# Patient Record
Sex: Female | Born: 1975
Health system: Southern US, Community
[De-identification: ages and names within clinical notes are randomized; demographics above are authoritative.]

## PROBLEM LIST (undated history)

## (undated) DIAGNOSIS — I499 Cardiac arrhythmia, unspecified: Secondary | ICD-10-CM

## (undated) DIAGNOSIS — R002 Palpitations: Secondary | ICD-10-CM

## (undated) DIAGNOSIS — C50919 Malignant neoplasm of unspecified site of unspecified female breast: Secondary | ICD-10-CM

## (undated) DIAGNOSIS — M25512 Pain in left shoulder: Secondary | ICD-10-CM

## (undated) DIAGNOSIS — I493 Ventricular premature depolarization: Secondary | ICD-10-CM

## (undated) DIAGNOSIS — L209 Atopic dermatitis, unspecified: Secondary | ICD-10-CM

## (undated) DIAGNOSIS — C4491 Basal cell carcinoma of skin, unspecified: Secondary | ICD-10-CM

## (undated) DIAGNOSIS — M25511 Pain in right shoulder: Secondary | ICD-10-CM

## (undated) HISTORY — DX: Hemochromatosis, unspecified: E83.119

## (undated) HISTORY — DX: Malignant neoplasm of unspecified site of unspecified female breast: C50.919

## (undated) HISTORY — PX: OTHER SURGICAL HISTORY: SHX169

## (undated) HISTORY — DX: Atopic dermatitis, unspecified: L20.9

## (undated) HISTORY — DX: Palpitations: R00.2

## (undated) HISTORY — DX: Pain in right shoulder: M25.511

## (undated) HISTORY — DX: Basal cell carcinoma of skin, unspecified: C44.91

## (undated) HISTORY — PX: WISDOM TOOTH EXTRACTION: SHX21

## (undated) HISTORY — DX: Pain in left shoulder: M25.512

---

## 2004-08-12 ENCOUNTER — Inpatient Hospital Stay: Payer: Self-pay

## 2004-08-12 DIAGNOSIS — O368390 Maternal care for abnormalities of the fetal heart rate or rhythm, unspecified trimester, not applicable or unspecified: Secondary | ICD-10-CM

## 2007-11-02 ENCOUNTER — Ambulatory Visit: Payer: Self-pay

## 2008-11-14 ENCOUNTER — Ambulatory Visit: Payer: Self-pay

## 2011-01-31 ENCOUNTER — Ambulatory Visit: Payer: Self-pay

## 2011-01-31 ENCOUNTER — Ambulatory Visit: Payer: Self-pay | Admitting: Internal Medicine

## 2011-02-01 LAB — AFP TUMOR MARKER: AFP-Tumor Marker: 2.9 ng/mL (ref 0.0–8.3)

## 2011-02-11 ENCOUNTER — Ambulatory Visit: Payer: Self-pay | Admitting: Internal Medicine

## 2011-02-11 ENCOUNTER — Ambulatory Visit: Payer: Self-pay

## 2011-06-02 ENCOUNTER — Ambulatory Visit: Payer: Self-pay | Admitting: Internal Medicine

## 2011-06-02 LAB — CANCER CENTER HEMOGLOBIN: HGB: 14.3 g/dL (ref 12.0–16.0)

## 2011-06-02 LAB — HEPATIC FUNCTION PANEL A (ARMC)
Albumin: 3.4 g/dL (ref 3.4–5.0)
Alkaline Phosphatase: 44 U/L — ABNORMAL LOW (ref 50–136)
Bilirubin, Direct: <0.1 mg/dL (ref 0.00–0.20)
SGOT(AST): 23 U/L (ref 15–37)
SGPT (ALT): 19 U/L
Total Protein: 7.1 g/dL (ref 6.4–8.2)

## 2011-06-02 LAB — IRON AND TIBC
Iron Bind.Cap.(Total): 394 ug/dL (ref 250–450)
Iron Saturation: 57 %
Iron: 225 ug/dL — ABNORMAL HIGH (ref 50–170)
Unbound Iron-Bind.Cap.: 169 ug/dL

## 2011-06-02 LAB — FERRITIN: Ferritin (ARMC): 51 ng/mL (ref 8–388)

## 2011-06-11 ENCOUNTER — Ambulatory Visit: Payer: Self-pay | Admitting: Internal Medicine

## 2011-10-03 ENCOUNTER — Ambulatory Visit: Payer: Self-pay | Admitting: Internal Medicine

## 2011-10-03 LAB — IRON AND TIBC
Iron Bind.Cap.(Total): 403 ug/dL (ref 250–450)
Iron Saturation: 33 %
Unbound Iron-Bind.Cap.: 271 ug/dL

## 2011-10-03 LAB — CANCER CENTER HEMOGLOBIN: HGB: 14.7 g/dL (ref 12.0–16.0)

## 2011-10-03 LAB — HEPATIC FUNCTION PANEL A (ARMC)
Albumin: 3.6 g/dL (ref 3.4–5.0)
Bilirubin, Direct: 0.1 mg/dL (ref 0.00–0.20)
Bilirubin,Total: 0.2 mg/dL (ref 0.2–1.0)
SGPT (ALT): 32 U/L (ref 12–78)
Total Protein: 7.3 g/dL (ref 6.4–8.2)

## 2011-10-03 LAB — FERRITIN: Ferritin (ARMC): 36 ng/mL (ref 8–388)

## 2011-10-12 ENCOUNTER — Ambulatory Visit: Payer: Self-pay | Admitting: Internal Medicine

## 2012-02-19 ENCOUNTER — Ambulatory Visit: Payer: Self-pay | Admitting: Internal Medicine

## 2012-02-19 LAB — CBC CANCER CENTER
Basophil #: 0 x10 3/mm (ref 0.0–0.1)
Basophil %: 0.4 %
Eosinophil %: 1.7 %
HGB: 14.4 g/dL (ref 12.0–16.0)
Lymphocyte #: 1.9 x10 3/mm (ref 1.0–3.6)
MCHC: 33.7 g/dL (ref 32.0–36.0)
Neutrophil #: 2.7 x10 3/mm (ref 1.4–6.5)
Neutrophil %: 52.3 %
Platelet: 292 x10 3/mm (ref 150–440)
RBC: 4.52 10*6/uL (ref 3.80–5.20)
RDW: 12.4 % (ref 11.5–14.5)

## 2012-02-19 LAB — HEPATIC FUNCTION PANEL A (ARMC)
Albumin: 3.6 g/dL (ref 3.4–5.0)
Alkaline Phosphatase: 62 U/L (ref 50–136)
Bilirubin, Direct: 0.1 mg/dL (ref 0.00–0.20)
Bilirubin,Total: 0.3 mg/dL (ref 0.2–1.0)
SGOT(AST): 16 U/L (ref 15–37)
SGPT (ALT): 23 U/L (ref 12–78)
Total Protein: 7.3 g/dL (ref 6.4–8.2)

## 2012-02-19 LAB — IRON AND TIBC
Iron Bind.Cap.(Total): 402 ug/dL (ref 250–450)
Iron: 197 ug/dL — ABNORMAL HIGH (ref 50–170)

## 2012-02-20 LAB — AFP TUMOR MARKER: AFP-Tumor Marker: 2.5 ng/mL (ref 0.0–8.3)

## 2012-03-13 ENCOUNTER — Ambulatory Visit: Payer: Self-pay | Admitting: Internal Medicine

## 2012-03-18 LAB — CANCER CENTER HEMOGLOBIN: HGB: 13.9 g/dL (ref 12.0–16.0)

## 2012-04-10 ENCOUNTER — Ambulatory Visit: Payer: Self-pay | Admitting: Internal Medicine

## 2012-05-11 ENCOUNTER — Ambulatory Visit: Payer: Self-pay | Admitting: Internal Medicine

## 2012-05-19 LAB — CANCER CENTER HEMOGLOBIN: HGB: 11.6 g/dL — ABNORMAL LOW (ref 12.0–16.0)

## 2012-06-09 LAB — CANCER CENTER HEMOGLOBIN: HGB: 11.3 g/dL — ABNORMAL LOW (ref 12.0–16.0)

## 2012-06-10 ENCOUNTER — Ambulatory Visit: Payer: Self-pay | Admitting: Internal Medicine

## 2012-06-30 LAB — CANCER CENTER HEMOGLOBIN: HGB: 10.3 g/dL — ABNORMAL LOW (ref 12.0–16.0)

## 2012-07-11 ENCOUNTER — Ambulatory Visit: Payer: Self-pay | Admitting: Internal Medicine

## 2012-08-10 ENCOUNTER — Ambulatory Visit: Payer: Self-pay | Admitting: Internal Medicine

## 2012-08-25 LAB — HEPATIC FUNCTION PANEL A (ARMC)
Albumin: 3.3 g/dL — ABNORMAL LOW (ref 3.4–5.0)
Bilirubin, Direct: <0.1 mg/dL (ref 0.00–0.20)
SGOT(AST): 17 U/L (ref 15–37)
SGPT (ALT): 17 U/L (ref 12–78)
Total Protein: 7.1 g/dL (ref 6.4–8.2)

## 2012-08-25 LAB — IRON AND TIBC
Iron Saturation: 7 %
Iron: 33 ug/dL — ABNORMAL LOW (ref 50–170)
Unbound Iron-Bind.Cap.: 452 ug/dL

## 2012-08-25 LAB — CBC CANCER CENTER
Basophil #: 0 x10 3/mm (ref 0.0–0.1)
Eosinophil %: 2.2 %
HCT: 35.2 % (ref 35.0–47.0)
HGB: 11.4 g/dL — ABNORMAL LOW (ref 12.0–16.0)
MCH: 24.2 pg — ABNORMAL LOW (ref 26.0–34.0)
MCHC: 32.3 g/dL (ref 32.0–36.0)
Monocyte #: 0.4 x10 3/mm (ref 0.2–0.9)
Monocyte %: 9.7 %
Neutrophil #: 2.2 x10 3/mm (ref 1.4–6.5)
Neutrophil %: 51.2 %
Platelet: 358 x10 3/mm (ref 150–440)
RBC: 4.7 10*6/uL (ref 3.80–5.20)
RDW: 17.4 % — ABNORMAL HIGH (ref 11.5–14.5)
WBC: 4.3 x10 3/mm (ref 3.6–11.0)

## 2012-08-25 LAB — FERRITIN: Ferritin (ARMC): 3 ng/mL — ABNORMAL LOW (ref 8–388)

## 2012-08-25 LAB — CREATININE, SERUM
EGFR (African American): >60
EGFR (Non-African Amer.): >60

## 2012-08-26 LAB — AFP TUMOR MARKER: AFP-Tumor Marker: 1.9 ng/mL (ref 0.0–8.3)

## 2012-09-10 ENCOUNTER — Ambulatory Visit: Payer: Self-pay | Admitting: Internal Medicine

## 2012-10-12 ENCOUNTER — Ambulatory Visit: Payer: Self-pay | Admitting: Internal Medicine

## 2012-10-12 LAB — CBC CANCER CENTER
Basophil #: 0 x10 3/mm (ref 0.0–0.1)
Basophil %: 0.6 %
Eosinophil #: 0.1 x10 3/mm (ref 0.0–0.7)
HCT: 36.3 % (ref 35.0–47.0)
MCH: 25 pg — ABNORMAL LOW (ref 26.0–34.0)
MCHC: 32.2 g/dL (ref 32.0–36.0)
Neutrophil %: 49.5 %
Platelet: 274 x10 3/mm (ref 150–440)
RBC: 4.67 10*6/uL (ref 3.80–5.20)
WBC: 4.5 x10 3/mm (ref 3.6–11.0)

## 2012-10-12 LAB — IRON AND TIBC: Unbound Iron-Bind.Cap.: 445 ug/dL

## 2012-11-09 LAB — IRON AND TIBC
Iron Saturation: 56 %
Iron: 205 ug/dL — ABNORMAL HIGH (ref 50–170)

## 2012-11-10 ENCOUNTER — Ambulatory Visit: Payer: Self-pay | Admitting: Internal Medicine

## 2013-01-04 ENCOUNTER — Ambulatory Visit: Payer: Self-pay | Admitting: Internal Medicine

## 2013-01-04 LAB — IRON AND TIBC
Iron Saturation: 23 %
Iron: 91 ug/dL (ref 50–170)
Unbound Iron-Bind.Cap.: 308 ug/dL

## 2013-01-04 LAB — CANCER CENTER HEMOGLOBIN: HGB: 14.1 g/dL (ref 12.0–16.0)

## 2013-01-04 LAB — FERRITIN: Ferritin (ARMC): 9 ng/mL (ref 8–388)

## 2013-01-10 ENCOUNTER — Ambulatory Visit: Payer: Self-pay | Admitting: Internal Medicine

## 2013-01-27 ENCOUNTER — Ambulatory Visit: Payer: Self-pay

## 2013-02-22 ENCOUNTER — Ambulatory Visit: Payer: Self-pay | Admitting: Internal Medicine

## 2013-02-22 LAB — CBC CANCER CENTER
BASOS PCT: 0.5 %
Basophil #: 0 x10 3/mm (ref 0.0–0.1)
EOS ABS: 0 x10 3/mm (ref 0.0–0.7)
EOS PCT: 0.5 %
HCT: 43.6 % (ref 35.0–47.0)
HGB: 14.7 g/dL (ref 12.0–16.0)
LYMPHS PCT: 33.7 %
Lymphocyte #: 1.9 x10 3/mm (ref 1.0–3.6)
MCH: 32 pg (ref 26.0–34.0)
MCHC: 33.7 g/dL (ref 32.0–36.0)
MCV: 95 fL (ref 80–100)
MONO ABS: 0.4 x10 3/mm (ref 0.2–0.9)
Monocyte %: 6.8 %
Neutrophil #: 3.4 x10 3/mm (ref 1.4–6.5)
Neutrophil %: 58.5 %
Platelet: 250 x10 3/mm (ref 150–440)
RBC: 4.59 10*6/uL (ref 3.80–5.20)
RDW: 13.6 % (ref 11.5–14.5)
WBC: 5.8 x10 3/mm (ref 3.6–11.0)

## 2013-02-22 LAB — HEPATIC FUNCTION PANEL A (ARMC)
ALBUMIN: 3.7 g/dL (ref 3.4–5.0)
ALK PHOS: 59 U/L
AST: 15 U/L (ref 15–37)
Bilirubin, Direct: 0.1 mg/dL (ref 0.00–0.20)
Bilirubin,Total: 0.3 mg/dL (ref 0.2–1.0)
SGPT (ALT): 22 U/L (ref 12–78)
TOTAL PROTEIN: 7 g/dL (ref 6.4–8.2)

## 2013-02-22 LAB — IRON AND TIBC
IRON: 260 ug/dL — AB (ref 50–170)
Iron Bind.Cap.(Total): 385 ug/dL (ref 250–450)
Iron Saturation: 68 %
UNBOUND IRON-BIND. CAP.: 125 ug/dL

## 2013-02-22 LAB — CREATININE, SERUM: Creatinine: 0.7 mg/dL (ref 0.60–1.30)

## 2013-02-22 LAB — FERRITIN: Ferritin (ARMC): 34 ng/mL (ref 8–388)

## 2013-03-13 ENCOUNTER — Ambulatory Visit: Payer: Self-pay | Admitting: Internal Medicine

## 2013-04-12 ENCOUNTER — Ambulatory Visit: Payer: Self-pay | Admitting: Cardiovascular Disease

## 2013-04-22 ENCOUNTER — Encounter (INDEPENDENT_AMBULATORY_CARE_PROVIDER_SITE_OTHER): Payer: Self-pay

## 2013-04-22 ENCOUNTER — Encounter: Payer: Self-pay | Admitting: Cardiovascular Disease

## 2013-04-22 ENCOUNTER — Ambulatory Visit (INDEPENDENT_AMBULATORY_CARE_PROVIDER_SITE_OTHER): Payer: Medicare HMO | Admitting: Cardiovascular Disease

## 2013-04-22 VITALS — BP 124/82 | HR 94 | Ht 66.0 in | Wt 124.5 lb

## 2013-04-22 DIAGNOSIS — I4949 Other premature depolarization: Secondary | ICD-10-CM

## 2013-04-22 DIAGNOSIS — R002 Palpitations: Secondary | ICD-10-CM

## 2013-04-22 DIAGNOSIS — R079 Chest pain, unspecified: Secondary | ICD-10-CM

## 2013-04-22 DIAGNOSIS — I493 Ventricular premature depolarization: Secondary | ICD-10-CM

## 2013-04-22 DIAGNOSIS — R000 Tachycardia, unspecified: Secondary | ICD-10-CM

## 2013-04-22 MED ORDER — PROPRANOLOL HCL 20 MG PO TABS: 20.0000 mg | ORAL_TABLET | Freq: Three times a day (TID) | ORAL | Status: DC

## 2013-04-22 NOTE — Patient Instructions (Addendum)
You are doing well.  Please start propranolol 20mg  three times a day as needed for tachycardia.  Go to the app store Search for "pulse meter" Look for cardiograph, instant heart rate  Your physician has recommended that you wear a 48 hour holter monitor. Holter monitors are medical devices that record the heart's electrical activity. Doctors most often use these monitors to diagnose arrhythmias. Arrhythmias are problems with the speed or rhythm of the heartbeat. The monitor is a small, portable device. You can wear one while you do your normal daily activities. This is usually used to diagnose what is causing palpitations/syncope (passing out). [facility] will contact you regarding when and where to pick this up.  Please call the office if you experience profound tachycardia and we can set up to wear a 30 day monitor.  Please call us if you have new issues that need to be addressed before your next appt.

## 2013-04-22 NOTE — Assessment & Plan Note (Signed)
She reports having periodic phlebotomy

## 2013-04-22 NOTE — Assessment & Plan Note (Signed)
Unable to exclude APCs or PVCs. 48 hour Holter monitor ordered. 30 day monitor could be ordered if needed

## 2013-04-22 NOTE — Progress Notes (Signed)
Patient ID: Adrienne Harris, female    DOB: [birth year], 38 y.o.   MRN: [id number]  HPI Comments: Adrienne Harris is a very pleasant 38 year old woman with a history of hemachromatosis who works at [employer] who presents for evaluation of palpitations and tachycardia.   She reports that at the beginning of the year 02/16/2013 she had an acute episode of tachycardia while in the kitchen washing dishes. Symptoms last for approximately one hour. It was not particularly rapid (like SVt), but the rate was faster and more regular and very strong. It resolved without further intervention.. she's not had any further episodes since that time. This was her first episode that she is aware.  She has had frequent episodes of extra beats typically in the afternoon or evening around dinner time or later. She describes these as quick fluttering in her chest, palpitations. She is relatively symptomatic  She denies any excessive coffee, cold medicines or other stimulants. She does report having stress but this is not a new issue. She does have periodic colotomy for hemachromatosis and does report having vagal episode on recent blood draw. She does not measure her blood pressure at home but does report it has been mildly elevated over the past several months. Because of this she stopped her birth control medication. Since then she reports improved blood pressure. She has not been monitoring blood pressure at home, only at work or in employee health  EKG shows normal sinus rhythm with rate 37 beats per minute, nonspecific ST abnormality  Recent blood work showing hematocrit 43, normal LFTs, creatinine 0.7   Outpatient Encounter Prescriptions as of 04/22/2013  Medication Sig  . naproxen sodium (ANAPROX) 550 MG tablet Take 550 mg by mouth 2 (two) times daily with a meal.  . propranolol (INDERAL) 20 MG tablet Take 1 tablet (20 mg total) by mouth 3 (three) times daily.     Review of Systems  Constitutional: Negative.   HENT:  Negative.   Eyes: Negative.   Respiratory: Negative.   Cardiovascular: Positive for palpitations.       Tachycardia  Gastrointestinal: Negative.   Endocrine: Negative.   Musculoskeletal: Negative.   Skin: Negative.   Allergic/Immunologic: Negative.   Neurological: Negative.   Hematological: Negative.   Psychiatric/Behavioral: Negative.   All other systems reviewed and are negative.    BP 124/82  Pulse 94  Ht 5\' 6"  (1.676 m)  Wt 124 lb 8 oz (56.473 kg)  BMI 20.10 kg/m2  Physical Exam  Nursing note and vitals reviewed. Constitutional: She is oriented to person, place, and time. She appears well-developed and well-nourished.  HENT:  Head: Normocephalic.  Nose: Nose normal.  Mouth/Throat: Oropharynx is clear and moist.  Eyes: Conjunctivae are normal. Pupils are equal, round, and reactive to light.  Neck: Normal range of motion. Neck supple. No JVD present.  Cardiovascular: Normal rate, regular rhythm, S1 normal, S2 normal, normal heart sounds and intact distal pulses.  Exam reveals no gallop and no friction rub.   No murmur heard. Pulmonary/Chest: Effort normal and breath sounds normal. No respiratory distress. She has no wheezes. She has no rales. She exhibits no tenderness.  Abdominal: Soft. Bowel sounds are normal. She exhibits no distension. There is no tenderness.  Musculoskeletal: Normal range of motion. She exhibits no edema and no tenderness.  Lymphadenopathy:    She has no cervical adenopathy.  Neurological: She is alert and oriented to person, place, and time. Coordination normal.  Skin: Skin is warm and dry. No rash  noted. No erythema.  Psychiatric: She has a normal mood and affect. Her behavior is normal. Judgment and thought content normal.    Assessment and Plan

## 2013-04-22 NOTE — Assessment & Plan Note (Signed)
Episode of tachycardia concerning for atrial tachycardia episode. Did not seem fast enough for SVT. Unable to exclude sinus tachycardia episode. Recommended she try propranolol as needed to start. She's not had any further episodes since early January 2015. Propranolol can also be used for palpitations as needed. Tried to reassure her that otherwise everything else looks good. We will order a Holter monitor 48 hour to exclude other arrhythmia. If she continues to have sporadic episodes, 30 day monitor could be ordered.  Normal EKG, normal clinical exam. No further testing needed.

## 2013-04-26 DIAGNOSIS — R002 Palpitations: Secondary | ICD-10-CM

## 2013-05-06 ENCOUNTER — Telehealth: Payer: Self-pay

## 2013-05-06 NOTE — Telephone Encounter (Signed)
Reviewed results of holter monitor w/ pt.   Discussed w/ her ways to decrease her episodes of APCs and PVCs, such as reducing or eliminating caffeine and stress, as well as getting a good night's sleep. She reports that she wakes up several times during the night and has difficulty falling back asleep.   She has propranolol at home that she has not taken.  Previously on antianxiety meds.  Advised her to contact PCP if she feels that she needs these. Advised her to take propranolol up to TID prn for episodes and try to increase her exercise.  She is agreeable to this and will call w/ further questions or concerns.

## 2013-05-09 ENCOUNTER — Other Ambulatory Visit: Payer: Self-pay

## 2013-05-09 ENCOUNTER — Ambulatory Visit (INDEPENDENT_AMBULATORY_CARE_PROVIDER_SITE_OTHER): Payer: Medicare HMO

## 2013-05-09 DIAGNOSIS — R000 Tachycardia, unspecified: Secondary | ICD-10-CM

## 2013-05-09 DIAGNOSIS — R002 Palpitations: Secondary | ICD-10-CM

## 2013-05-23 ENCOUNTER — Ambulatory Visit: Payer: Self-pay | Admitting: Internal Medicine

## 2013-05-24 LAB — IRON AND TIBC
IRON BIND. CAP.(TOTAL): 395 ug/dL (ref 250–450)
IRON: 58 ug/dL (ref 50–170)
Iron Saturation: 15 %
Unbound Iron-Bind.Cap.: 337 ug/dL

## 2013-05-24 LAB — CANCER CENTER HEMOGLOBIN: HGB: 13.4 g/dL (ref 12.0–16.0)

## 2013-05-24 LAB — FERRITIN: Ferritin (ARMC): 6 ng/mL — ABNORMAL LOW (ref 8–388)

## 2013-06-10 ENCOUNTER — Ambulatory Visit: Payer: Self-pay | Admitting: Internal Medicine

## 2013-08-09 ENCOUNTER — Ambulatory Visit: Payer: Self-pay | Admitting: Internal Medicine

## 2013-08-09 LAB — IRON AND TIBC
IRON SATURATION: 14 %
IRON: 57 ug/dL (ref 50–170)
Iron Bind.Cap.(Total): 395 ug/dL (ref 250–450)
Unbound Iron-Bind.Cap.: 338 ug/dL

## 2013-08-09 LAB — FERRITIN: FERRITIN (ARMC): 8 ng/mL (ref 8–388)

## 2013-08-09 LAB — CANCER CENTER HEMOGLOBIN: HGB: 13.8 g/dL (ref 12.0–16.0)

## 2013-08-10 ENCOUNTER — Ambulatory Visit: Payer: Self-pay | Admitting: Internal Medicine

## 2013-11-01 ENCOUNTER — Ambulatory Visit: Payer: Self-pay | Admitting: Internal Medicine

## 2013-11-01 LAB — CANCER CENTER HEMOGLOBIN: HGB: 14.4 g/dL (ref 12.0–16.0)

## 2013-11-08 LAB — IRON AND TIBC
IRON BIND. CAP.(TOTAL): 430 ug/dL (ref 250–450)
Iron Saturation: 41 %
Iron: 175 ug/dL — ABNORMAL HIGH (ref 50–170)
Unbound Iron-Bind.Cap.: 255 ug/dL

## 2013-11-08 LAB — FERRITIN: FERRITIN (ARMC): 10 ng/mL (ref 8–388)

## 2013-11-10 ENCOUNTER — Ambulatory Visit: Payer: Self-pay | Admitting: Internal Medicine

## 2014-01-24 ENCOUNTER — Ambulatory Visit: Payer: Self-pay | Admitting: Internal Medicine

## 2014-01-24 LAB — HEPATIC FUNCTION PANEL A (ARMC)
ALBUMIN: 3.6 g/dL (ref 3.4–5.0)
ALK PHOS: 60 U/L
AST: 13 U/L — AB (ref 15–37)
Bilirubin, Direct: 0.1 mg/dL (ref 0.0–0.2)
Bilirubin,Total: 0.2 mg/dL (ref 0.2–1.0)
SGPT (ALT): 19 U/L
Total Protein: 6.9 g/dL (ref 6.4–8.2)

## 2014-01-24 LAB — IRON AND TIBC
IRON BIND. CAP.(TOTAL): 359 ug/dL (ref 250–450)
IRON SATURATION: 37 %
IRON: 132 ug/dL (ref 50–170)
Unbound Iron-Bind.Cap.: 227 ug/dL

## 2014-01-24 LAB — CBC CANCER CENTER
Basophil #: 0 x10 3/mm (ref 0.0–0.1)
Basophil %: 0.4 %
EOS PCT: 0.8 %
Eosinophil #: 0.1 x10 3/mm (ref 0.0–0.7)
HCT: 42.9 % (ref 35.0–47.0)
HGB: 14.4 g/dL (ref 12.0–16.0)
LYMPHS PCT: 39.1 %
Lymphocyte #: 2.6 x10 3/mm (ref 1.0–3.6)
MCH: 32.2 pg (ref 26.0–34.0)
MCHC: 33.6 g/dL (ref 32.0–36.0)
MCV: 96 fL (ref 80–100)
Monocyte #: 0.7 x10 3/mm (ref 0.2–0.9)
Monocyte %: 9.7 %
NEUTROS ABS: 3.4 x10 3/mm (ref 1.4–6.5)
Neutrophil %: 50 %
Platelet: 248 x10 3/mm (ref 150–440)
RBC: 4.47 10*6/uL (ref 3.80–5.20)
RDW: 12.6 % (ref 11.5–14.5)
WBC: 6.8 x10 3/mm (ref 3.6–11.0)

## 2014-01-24 LAB — FERRITIN: Ferritin (ARMC): 14 ng/mL (ref 8–388)

## 2014-01-24 LAB — CREATININE, SERUM
CREATININE: 0.68 mg/dL (ref 0.60–1.30)
EGFR (Non-African Amer.): >60

## 2014-01-25 LAB — AFP TUMOR MARKER: AFP-Tumor Marker: 2.8 ng/mL (ref 0.0–8.3)

## 2014-02-10 ENCOUNTER — Ambulatory Visit: Payer: Self-pay | Admitting: Internal Medicine

## 2014-05-30 ENCOUNTER — Ambulatory Visit
Admit: 2014-05-30 | Disposition: A | Discharge status: Home / Self Care | Payer: Self-pay | Attending: Internal Medicine | Admitting: Internal Medicine

## 2014-05-30 LAB — IRON AND TIBC
IRON SATURATION: 24.9
IRON: 91 ug/dL
Iron Bind.Cap.(Total): 366 (ref 250–450)
UNBOUND IRON-BIND. CAP.: 275.1

## 2014-05-30 LAB — FERRITIN: Ferritin (ARMC): 27 ng/mL

## 2014-05-30 LAB — CANCER CENTER HEMOGLOBIN: HGB: 14.8 g/dL (ref 12.0–16.0)

## 2014-09-26 ENCOUNTER — Other Ambulatory Visit: Payer: Self-pay | Admitting: *Deleted

## 2014-09-26 ENCOUNTER — Inpatient Hospital Stay: Payer: 59 | Attending: Internal Medicine

## 2014-09-26 DIAGNOSIS — Z79899 Other long term (current) drug therapy: Secondary | ICD-10-CM | POA: Diagnosis not present

## 2014-09-26 LAB — IRON AND TIBC
IRON: 126 ug/dL (ref 28–170)
SATURATION RATIOS: 33 % — AB (ref 10.4–31.8)
TIBC: 377 ug/dL (ref 250–450)
UIBC: 251 ug/dL

## 2014-09-26 LAB — FERRITIN: Ferritin: 69 ng/mL (ref 11–307)

## 2014-09-26 LAB — HEMOGLOBIN: Hemoglobin: 15.1 g/dL (ref 12.0–16.0)

## 2015-01-23 ENCOUNTER — Inpatient Hospital Stay: Payer: Managed Care, Other (non HMO)

## 2015-01-23 ENCOUNTER — Inpatient Hospital Stay: Payer: Managed Care, Other (non HMO) | Attending: Internal Medicine | Admitting: Internal Medicine

## 2015-01-23 ENCOUNTER — Other Ambulatory Visit: Payer: Self-pay | Admitting: *Deleted

## 2015-01-23 DIAGNOSIS — Z79899 Other long term (current) drug therapy: Secondary | ICD-10-CM | POA: Diagnosis not present

## 2015-01-23 DIAGNOSIS — N644 Mastodynia: Secondary | ICD-10-CM | POA: Diagnosis not present

## 2015-01-23 LAB — CBC WITH DIFFERENTIAL/PLATELET
Basophils Absolute: 0 10*3/uL (ref 0–0.1)
Basophils Relative: 0 %
Eosinophils Absolute: 0 10*3/uL (ref 0–0.7)
Eosinophils Relative: 1 %
HEMATOCRIT: 43 % (ref 35.0–47.0)
HEMOGLOBIN: 14.7 g/dL (ref 12.0–16.0)
LYMPHS ABS: 2.2 10*3/uL (ref 1.0–3.6)
LYMPHS PCT: 32 %
MCH: 32.4 pg (ref 26.0–34.0)
MCHC: 34.3 g/dL (ref 32.0–36.0)
MCV: 94.6 fL (ref 80.0–100.0)
MONO ABS: 0.6 10*3/uL (ref 0.2–0.9)
MONOS PCT: 8 %
NEUTROS ABS: 4.1 10*3/uL (ref 1.4–6.5)
NEUTROS PCT: 59 %
Platelets: 258 10*3/uL (ref 150–440)
RBC: 4.55 MIL/uL (ref 3.80–5.20)
RDW: 12.4 % (ref 11.5–14.5)
WBC: 7 10*3/uL (ref 3.6–11.0)

## 2015-01-23 LAB — IRON AND TIBC
Iron: 116 ug/dL (ref 28–170)
SATURATION RATIOS: 30 % (ref 10.4–31.8)
TIBC: 386 ug/dL (ref 250–450)
UIBC: 270 ug/dL

## 2015-01-23 LAB — COMPREHENSIVE METABOLIC PANEL
ALK PHOS: 48 U/L (ref 38–126)
ALT: 12 U/L — ABNORMAL LOW (ref 14–54)
ANION GAP: 6 (ref 5–15)
AST: 15 U/L (ref 15–41)
Albumin: 4 g/dL (ref 3.5–5.0)
BILIRUBIN TOTAL: 0.2 mg/dL — AB (ref 0.3–1.2)
BUN: 12 mg/dL (ref 6–20)
CALCIUM: 9.1 mg/dL (ref 8.9–10.3)
CO2: 28 mmol/L (ref 22–32)
Chloride: 103 mmol/L (ref 101–111)
Creatinine, Ser: 0.66 mg/dL (ref 0.44–1.00)
GFR calc Af Amer: >60 mL/min (ref >60)
GLUCOSE: 104 mg/dL — AB (ref 65–99)
POTASSIUM: 3.5 mmol/L (ref 3.5–5.1)
Sodium: 137 mmol/L (ref 135–145)
TOTAL PROTEIN: 7.3 g/dL (ref 6.5–8.1)

## 2015-01-23 LAB — FERRITIN: FERRITIN: 32 ng/mL (ref 11–307)

## 2015-01-23 NOTE — Progress Notes (Signed)
Pt having significant pain in left shoulder , arm, axilla and breast on same side

## 2015-01-23 NOTE — Progress Notes (Signed)
[place] [facility] [phone]  Fax:([phone]) [place] OB: Mar 18, 1975  MR#: [id number]  WPY#:[id number]  Patient Care Team: Trude Mcburney Dear, MD as PCP - General (Family Medicine)  CHIEF COMPLAINT: No chief complaint on file.  compound heterozygous hereditary hemochromatosis (C282Y and H63D)    No history exists.    No flowsheet data found.  HISTORY OF PRESENT ILLNESS:   Miss Dalto has been followed in our clinic for hereditary hemachromatosis since 2009. She has had multiple phlebotomies initially, but over the past several years her ferritin level was stable, so no phlebotomies since 2015.  Current status: Miss Rightmyer returns to our clinic for follow-up visit. She has been active since her last appointment, working full-time and taking care of her child. She has been complaining of left shoulder/left chest/left breast pain for past 2 years, with the pain worsening over the past several months. The pain could be exacerbated sometimes by sitting in the chair and typing, especially at the end of the day or the work week. She denies any masses, swelling of the arm or the breast, redness, nipple discharge.  REVIEW OF SYSTEMS:   Review of Systems  All other systems reviewed and are negative.    PAST MEDICAL HISTORY: Past Medical History  Diagnosis Date  . Palpitations   . Basal cell carcinoma   . Hemochromatosis     PAST SURGICAL HISTORY: No past surgical history on file.  FAMILY HISTORY Family History  Problem Relation Age of Onset  . Hyperlipidemia Maternal Grandmother   . Heart disease Maternal Grandfather   . Hyperlipidemia Maternal Grandfather   . Hypertension Maternal Grandfather   . Hypertension Father     ADVANCED DIRECTIVES:  No flowsheet data found.  HEALTH MAINTENANCE: Social History  Substance Use Topics  . Smoking status: Never Smoker   . Smokeless tobacco: Not on file  . Alcohol Use: Yes     Comment: current some day      No Known Allergies  Current Outpatient Prescriptions  Medication Sig Dispense Refill  . naproxen sodium (ANAPROX) 550 MG tablet Take 550 mg by mouth 2 (two) times daily with a meal.    . propranolol (INDERAL) 20 MG tablet Take 1 tablet (20 mg total) by mouth 3 (three) times daily. 90 tablet 6   No current facility-administered medications for this visit.    OBJECTIVE:  There were no vitals filed for this visit.   There is no weight on file to calculate BMI.    ECOG FS:0 - Asymptomatic  Physical Exam  Constitutional: She is oriented to person, place, and time and well-developed, well-nourished, and in no distress. No distress.  Think Caucasian female  HENT:  Head: Normocephalic and atraumatic.  Right Ear: External ear normal.  Left Ear: External ear normal.  Mouth/Throat: Oropharynx is clear and moist.  Eyes: Conjunctivae are normal. Pupils are equal, round, and reactive to light. Right eye exhibits no discharge. Left eye exhibits no discharge. No scleral icterus.  Neck: Normal range of motion. Neck supple. No JVD present. No tracheal deviation present. No thyromegaly present.  Cardiovascular: Normal rate, regular rhythm, normal heart sounds and intact distal pulses.  Exam reveals no gallop and no friction rub.   No murmur heard. Pulmonary/Chest: Effort normal and breath sounds normal. No stridor. No respiratory distress. She has no wheezes. She has no rales. She exhibits no tenderness. Right breast exhibits no inverted nipple, no mass, no nipple discharge, no skin  change and no tenderness. Left breast exhibits no inverted nipple, no mass, no nipple discharge, no skin change and no tenderness.  Abdominal: Soft. Bowel sounds are normal. She exhibits no distension and no mass. There is no tenderness. There is no rebound and no guarding.  Genitourinary:  Postponed  Musculoskeletal: Normal range of motion. She exhibits no edema.       Left shoulder: She exhibits tenderness.   Lymphadenopathy:    She has no cervical adenopathy.  Neurological: She is alert and oriented to person, place, and time. She has normal reflexes. No cranial nerve deficit. She exhibits normal muscle tone. Gait normal. Coordination normal. GCS score is 15.  Skin: Skin is warm. No rash noted. She is not diaphoretic. No erythema. No pallor.  Psychiatric: Mood, memory, affect and judgment normal.  Nursing note and vitals reviewed.    LAB RESULTS:  CBC Latest Ref Rng 01/23/2015 09/26/2014  WBC 3.6 - 11.0 K/uL 7.0 -  Hemoglobin 12.0 - 16.0 g/dL 14.7 15.1  Hematocrit 35.0 - 47.0 % 43.0 -  Platelets 150 - 440 K/uL 258 -    Clinical Support on 01/23/2015  Component Date Value Ref Range Status  . WBC 01/23/2015 7.0  3.6 - 11.0 K/uL Final  . RBC 01/23/2015 4.55  3.80 - 5.20 MIL/uL Final  . Hemoglobin 01/23/2015 14.7  12.0 - 16.0 g/dL Final  . HCT 01/23/2015 43.0  35.0 - 47.0 % Final  . MCV 01/23/2015 94.6  80.0 - 100.0 fL Final  . MCH 01/23/2015 32.4  26.0 - 34.0 pg Final  . MCHC 01/23/2015 34.3  32.0 - 36.0 g/dL Final  . RDW 01/23/2015 12.4  11.5 - 14.5 % Final  . Platelets 01/23/2015 258  150 - 440 K/uL Final  . Neutrophils Relative % 01/23/2015 59   Final  . Neutro Abs 01/23/2015 4.1  1.4 - 6.5 K/uL Final  . Lymphocytes Relative 01/23/2015 32   Final  . Lymphs Abs 01/23/2015 2.2  1.0 - 3.6 K/uL Final  . Monocytes Relative 01/23/2015 8   Final  . Monocytes Absolute 01/23/2015 0.6  0.2 - 0.9 K/uL Final  . Eosinophils Relative 01/23/2015 1   Final  . Eosinophils Absolute 01/23/2015 0.0  0 - 0.7 K/uL Final  . Basophils Relative 01/23/2015 0   Final  . Basophils Absolute 01/23/2015 0.0  0 - 0.1 K/uL Final  . Sodium 01/23/2015 137  135 - 145 mmol/L Final  . Potassium 01/23/2015 3.5  3.5 - 5.1 mmol/L Final  . Chloride 01/23/2015 103  101 - 111 mmol/L Final  . CO2 01/23/2015 28  22 - 32 mmol/L Final  . Glucose, Bld 01/23/2015 104* 65 - 99 mg/dL Final  . BUN 01/23/2015 12  6 - 20 mg/dL  Final  . Creatinine, Ser 01/23/2015 0.66  0.44 - 1.00 mg/dL Final  . Calcium 01/23/2015 9.1  8.9 - 10.3 mg/dL Final  . Total Protein 01/23/2015 7.3  6.5 - 8.1 g/dL Final  . Albumin 01/23/2015 4.0  3.5 - 5.0 g/dL Final  . AST 01/23/2015 15  15 - 41 U/L Final  . ALT 01/23/2015 12* 14 - 54 U/L Final  . Alkaline Phosphatase 01/23/2015 48  38 - 126 U/L Final  . Total Bilirubin 01/23/2015 0.2* 0.3 - 1.2 mg/dL Final  . GFR calc non Af Amer 01/23/2015 >60  >60 mL/min Final  . GFR calc Af Amer 01/23/2015 >60  >60 mL/min Final   Comment: (NOTE) The eGFR has been calculated using  the CKD EPI equation. This calculation has not been validated in all clinical situations. eGFR's persistently <60 mL/min signify possible Chronic Kidney Disease.   . Anion gap 01/23/2015 6  5 - 15 Final      STUDIES: No results found.  ASSESSMENT and MEDICAL DECISION MAKING: Compound heterozygous hereditary hemochromatosis-patient has not required phlebotomy for almost 2 years. We will check her ferritin today, and we will aim to keep ferritin between 50 and 100 ng/mL. She does not have any evidence of liver abdomen at this at this time, so there is no need for MRI of the liver. So far, no indication for phlebotomies.  Left chest/shoulder/neck pain-physical exam does not show any evidence of breast or chest wall masses, nodules. Rather, there is tenderness to palpation of the muscle along the biceps groove. I suspect that there might be a component of rotator cuff syndrome, taking into account Ms. 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 We will obtain an opinion from our orthopedic surgery colleagues. I do not believe that she requires mammogram until she turns 71.  She will follow-up with [place] with labs every 4 months, and she will be seen in 12 months.      Patient expressed understanding and was in agreement with this plan. She also understands that She can call clinic at any  time with any questions, concerns, or complaints.    No matching staging information was found for the patient.  Roxana Hires, MD   01/23/2015 2:07 PM

## 2015-01-24 ENCOUNTER — Telehealth: Payer: Self-pay | Admitting: *Deleted

## 2015-01-24 ENCOUNTER — Encounter: Payer: Self-pay | Admitting: Family Medicine

## 2015-01-24 ENCOUNTER — Encounter: Payer: Self-pay | Admitting: Internal Medicine

## 2015-01-24 ENCOUNTER — Other Ambulatory Visit: Payer: Self-pay | Admitting: Family Medicine

## 2015-01-24 DIAGNOSIS — M25511 Pain in right shoulder: Secondary | ICD-10-CM

## 2015-01-24 DIAGNOSIS — M25512 Pain in left shoulder: Secondary | ICD-10-CM

## 2015-01-24 HISTORY — DX: Pain in right shoulder: M25.511

## 2015-01-24 HISTORY — DX: Pain in left shoulder: M25.512

## 2015-01-24 NOTE — Telephone Encounter (Signed)
Called pt and let her know that the ferritin was 32 and dr Rudean Hitt was good with level and no changes needs to be done for anything.  I will put her labs interoffice mail to her. She was agreeable to the plan.  I also made her an appt  For ortho Dr. Mack Guise for 01/29/2015 2:30. She does need to arrive 2:15.  She is agreeable to the plan.

## 2015-01-25 ENCOUNTER — Telehealth: Payer: Self-pay | Admitting: *Deleted

## 2015-01-25 MED ORDER — PROPRANOLOL HCL 20 MG PO TABS: 20.0000 mg | ORAL_TABLET | Freq: Three times a day (TID) | ORAL | Status: DC

## 2015-01-25 NOTE — Telephone Encounter (Signed)
Advised pt to take her propranolol as advised, she has not been taking. Sent in rx for pt but advised that she needs an appt.  Pt sched to see Ignacia Bayley, NP on 03/09/15.

## 2015-01-25 NOTE — Telephone Encounter (Signed)
Pt calling stating she saw Dr Rockey Situ last march and was put on some medication for Occasional PVC's but lately she's been having some again She needs a refill and also is not sure if she needs an appointment.  I offered her today at 3 pm but she has other arrangements and can't make that one. Would like to know what can be done for her.  Please advise  She is going to be out starting today.

## 2015-01-30 ENCOUNTER — Ambulatory Visit: Payer: Medicare HMO | Admitting: Internal Medicine

## 2015-01-30 ENCOUNTER — Other Ambulatory Visit: Payer: Medicare HMO

## 2015-01-30 ENCOUNTER — Ambulatory Visit: Payer: Medicare HMO

## 2015-02-21 ENCOUNTER — Ambulatory Visit: Payer: Managed Care, Other (non HMO) | Attending: Orthopedic Surgery

## 2015-02-21 DIAGNOSIS — M25512 Pain in left shoulder: Secondary | ICD-10-CM | POA: Diagnosis present

## 2015-02-21 NOTE — Therapy (Signed)
[place] [facility] SERVICES [address] [place], [place], [address] Phone: [phone]   Fax:  [phone]  Physical Therapy Evaluation  Patient Details  Name: Adrienne Harris MRN: [id number] Date of Birth: Sep 21, 1975 Referring Provider: Mack Guise  Encounter Date: 02/21/2015      PT End of Session - 02/21/15 1831    Visit Number 1   Number of Visits 9   Date for PT Re-Evaluation 03/21/15   PT Start Time 1600   PT Stop Time 1700   PT Time Calculation (min) 60 min   Activity Tolerance Patient tolerated treatment well   Behavior During Therapy [facility] for tasks assessed/performed      Past Medical History  Diagnosis Date  . Palpitations   . Basal cell carcinoma   . Hemochromatosis   . Atopic dermatitis   . Shoulder pain, right 01/24/2015  . Shoulder pain, left 01/24/2015    History reviewed. No pertinent past surgical history.  There were no vitals filed for this visit.  Visit Diagnosis:  Pain in joint of left shoulder - Plan: PT plan of care cert/re-cert      Subjective Assessment - 02/21/15 1610    Subjective (p) pt reports having L shoulder pain "off and on" for 2 years. pt reports her L shoulder pain has gotten worse and more consistently hurting the past 2 months. She feels her pain gets worse as the day goes on. 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 pt reports she does have infrequent numbness down the arm. she does report neck pain. pt reports propping her arm up seems to help some. She reports her pain is an achiness than can sometimes extend into the R neck, under the arm put and even into the chest area at times. pt reports no particular motions seem to aggrevate her shoulder. she denies any weakness. pt denies any change in bowel bladder.    How long can you sit comfortably? (p) 5 min    Diagnostic tests (p) X-ray negative for shoulder (some DDD in cervical spine)   Patient Stated Goals (p) to be painfree    Currently in Pain? (p) Yes   Pain Score (p) 7    Pain Location (p) --  L shoulder, axilla and chest    Pain Descriptors / Indicators (p) Aching            [facility] PT Assessment - 02/21/15 1726    Assessment   Medical Diagnosis L shoulder impingement   Referring Provider krasinski   Onset Date/Surgical Date 12/22/14   Precautions   Precautions None   Balance Screen   Has the patient fallen in the past 6 months No   Has the patient had a decrease in activity level because of a fear of falling?  No   Is the patient reluctant to leave their home because of a fear of falling?  No   Home [place] residence   Living Arrangements Spouse/significant other   Type of [place] One level   Prior Function   Level of [place] Independent  without pain   Vocation Full time employment  nutritionist        POSTURE/OBSERVATION: fair:  forward head, slightly internally rotated shoulders    PROM/AROM: pt has full shoulder AROM bilaterally Cervical rotation limited to 55deg L rotation and 30 deg L sidebend  Palpation: pt is non tender over spinous process of C  spine Tender over RTC insertion especially subscapularis Pt is tender medial scapular border Tender/hypertonic L pectoralis minor   Repeated cervical ROM testing was no worse/no better Posture correction slightly reduced pain    STRENGTH:  Graded on a 0-5 scale Muscle Group Left Right  Shoulder flex 4 5  Shoulder Abd 4 5  Shoulder Ext    Shoulder IR/ER 4 4+                                               SENSATION: WNL Dermatomes WNL bilaterally L C6 slightly reduced strength (myotome)  Reflexes:  2+ bilaterally C5, C5, C7  SPECIAL TESTS:  (-) Neers (-) painful arch (+) belly press (L) for pain                     PT Education - 02/21/15 1830    Education provided Yes   Education Details exam findings, POC, HEP, posture ed    Person(s) Educated Patient   Methods Explanation;Handout   Comprehension Verbalized understanding;Returned demonstration             PT Long Term Goals - 02/21/15 1835    PT LONG TERM GOAL #1   Title pt will be able to sleep through the night without waking due to shoulder pain.    Time 4   Period Weeks   Status New   PT LONG TERM GOAL #2   Title pt will demonstrate proper sitting/standing posture needing <3 cues for correction during PT session    Time 4   Period Weeks   Status New   PT LONG TERM GOAL #3   Title pt will work a full day with no more than 3/10 pain in the shoulder    Time 4   Period Weeks   Status New   PT LONG TERM GOAL #4   Title pt will be independent and compliant with HEP   Time 4   Period Weeks   Status New               Plan - 02/21/15 1832    Clinical Impression Statement pt presents with worsening L shoulder, axilla pain over the last 2 months. Pt did not demonstrate strong cervical component upon exam, but does report intermittant numbness of the LUE and neck pain. pt is tender over RTC insertion, especially subscapularis and has more pain with shoulder IR. She does not have positive special tests for impingement, but did have pain with L belly press. pt also demonstrates L shoulder and periscapular weakness and poor posture. pt would beneifit from skilled PT services to address pain and the above stated impairments.    Pt will benefit from skilled therapeutic intervention in order to improve on the following deficits Decreased strength;Hypomobility;Pain;Impaired flexibility;Postural dysfunction;Impaired UE functional use   Rehab Potential Good   PT Frequency 2x / week   PT Duration 4 weeks   PT Treatment/Interventions Cryotherapy;Electrical Stimulation;Iontophoresis 4mg /ml Dexamethasone;Moist Heat;Traction;Ultrasound;Neuromuscular re-education;Therapeutic exercise;Therapeutic activities;Functional mobility training;Manual techniques;Dry  needling;Taping   PT Next Visit Plan HEP         Problem List Patient Active Problem List   Diagnosis Date Noted  . Shoulder pain, left 01/24/2015  . PVC's (premature ventricular contractions) 04/22/2013  . Tachycardia 04/22/2013  . Hemochromatosis 04/22/2013  . Palpitations 04/22/2013   Gorden Harms. Yesmin Mutch, PT, DPT [phone]  Dior Dominik 02/21/2015, 6:40 PM  [place] MAIN [facility] SERVICES [address] [place], [place], [address] Phone: [phone]   Fax:  [phone]  Name: Adrienne Harris MRN: [id number] Date of Birth: May 14, 1975

## 2015-02-21 NOTE — Patient Instructions (Signed)
[email or web address] Chin tucks x 10 , 5x daily scap retractions 3s hold x 10 3x daily

## 2015-02-26 ENCOUNTER — Ambulatory Visit: Payer: Managed Care, Other (non HMO)

## 2015-02-26 DIAGNOSIS — M25512 Pain in left shoulder: Secondary | ICD-10-CM | POA: Diagnosis not present

## 2015-02-27 ENCOUNTER — Ambulatory Visit: Payer: Managed Care, Other (non HMO)

## 2015-02-27 NOTE — Therapy (Signed)
[place] MAIN [facility] SERVICES [address] [place], [place], [address] Phone: [phone]   Fax:  [phone]  Physical Therapy Treatment  Patient Details  Name: Adrienne Harris MRN: [id number] Date of Birth: 03/15/1975 Referring Provider: Mack Guise  Encounter Date: 02/26/2015      PT End of Session - 02/27/15 1108    Visit Number 2   Number of Visits 9   Date for PT Re-Evaluation 03/21/15   PT Start Time [id number]   PT Stop Time 1805   PT Time Calculation (min) 34 min   Activity Tolerance Patient tolerated treatment well   Behavior During Therapy [facility] for tasks assessed/performed      Past Medical History  Diagnosis Date  . Palpitations   . Basal cell carcinoma   . Hemochromatosis   . Atopic dermatitis   . Shoulder pain, right 01/24/2015  . Shoulder pain, left 01/24/2015    No past surgical history on file.  There were no vitals filed for this visit.  Visit Diagnosis:  Pain in joint of left shoulder      Subjective Assessment - 02/26/15 1854    Subjective Patient reports roughly 2 years of on and off pain and tightness symptoms in her L shoulder, neck, and axilla region. Patient reports she has improved since her eval, and currently has symptoms in typing position or with combined humeral extension and horizontal abduction.    Limitations --  Typing   Currently in Pain? Yes  No pain at rest, soreness with extension/horizontal abduction combined.         Grade I-II mobilizations provided to thoracic spine between T1-T5, multiple cavitations noted with relief of symptoms noted with each. Glenohumeral superior-inferior and clavicular superior-inferior mobilizations provided as well grade I-II, well tolerated no report of symptoms with either technique.  Combined IR, humeral extension, and horizontal adduction mobilizations grade I-II for 30" bouts for 3 sets. Significantly reduced her symptoms with extension/horizontal abduction.    Seated t-spine extension 1 set x 5 repetitions with 5" holds  MMT for IR/ER biceps flexion, retraction - mild pain noted with IR, no pain noted Hawkins-Kennedy testing.   Standing pec stretching with elbow flexed/extended at doorway both below and above waist with mild relief of symptoms noted afterwards. 3 sets x 8 bouts of 5-10"                           PT Education - 02/27/15 1107    Education provided Yes   Education Details Progression of HEP as well as techniques, modifications to work station.    Person(s) Educated Patient   Methods Explanation;Demonstration;Verbal cues;Handout   Comprehension Verbalized understanding;Returned demonstration             PT Long Term Goals - 02/21/15 1835    PT LONG TERM GOAL #1   Title pt will be able to sleep through the night without waking due to shoulder pain.    Time 4   Period Weeks   Status New   PT LONG TERM GOAL #2   Title pt will demonstrate proper sitting/standing posture needing <3 cues for correction during PT session    Time 4   Period Weeks   Status New   PT LONG TERM GOAL #3   Title pt will work a full day with no more than 3/10 pain in the shoulder    Time 4   Period Weeks   Status  New   PT LONG TERM GOAL #4   Title pt will be independent and compliant with HEP   Time 4   Period Weeks   Status New               Plan - 02/27/15 1109    Clinical Impression Statement Patient reports decreased symptoms after multiple bouts of manual therapy for IR, extension, and adduction. Multiple cavitations noted with thoracic spine mobilizations as well, with reports of relief of symptoms. Patient appears to be experiencing decreased pain symptoms with postural education and pectoral stretching. No deficits in RTC MMT during this session.    Pt will benefit from skilled therapeutic intervention in order to improve on the following deficits Decreased strength;Hypomobility;Pain;Impaired  flexibility;Postural dysfunction;Impaired UE functional use   Rehab Potential Good   PT Frequency 2x / week   PT Duration 4 weeks   PT Treatment/Interventions Cryotherapy;Electrical Stimulation;Iontophoresis 4mg /ml Dexamethasone;Moist Heat;Traction;Ultrasound;Neuromuscular re-education;Therapeutic exercise;Therapeutic activities;Functional mobility training;Manual techniques;Dry needling;Taping   PT Next Visit Plan HEP        Problem List Patient Active Problem List   Diagnosis Date Noted  . Shoulder pain, left 01/24/2015  . PVC's (premature ventricular contractions) 04/22/2013  . Tachycardia 04/22/2013  . Hemochromatosis 04/22/2013  . Palpitations 04/22/2013   Kerman Passey, PT, DPT    02/27/2015, 1:45 PM  [place] MAIN [facility] SERVICES [address] [place] [address] [place], [place], [address] Phone: [phone]   Fax:  [phone]  Name: Adrienne Harris MRN: [id number] Date of Birth: [birth year]

## 2015-02-28 ENCOUNTER — Ambulatory Visit: Payer: Managed Care, Other (non HMO)

## 2015-02-28 DIAGNOSIS — M25512 Pain in left shoulder: Secondary | ICD-10-CM | POA: Diagnosis not present

## 2015-02-28 NOTE — Therapy (Signed)
[place] MAIN [facility] SERVICES [address] [place], [place], [address] Phone: [phone]   Fax:  [phone]  Physical Therapy Treatment  Patient Details  Name: Adrienne Harris MRN: [id number] Date of Birth: [birth year] Referring Provider: Mack Guise  Encounter Date: 02/28/2015      PT End of Session - 02/28/15 1111    Visit Number 3   Number of Visits 9   Date for PT Re-Evaluation 03/21/15   PT Start Time 0948   PT Stop Time 1033   PT Time Calculation (min) 45 min   Activity Tolerance Patient tolerated treatment well   Behavior During Therapy [facility] for tasks assessed/performed      Past Medical History  Diagnosis Date  . Palpitations   . Basal cell carcinoma   . Hemochromatosis   . Atopic dermatitis   . Shoulder pain, right 01/24/2015  . Shoulder pain, left 01/24/2015    History reviewed. No pertinent past surgical history.  There were no vitals filed for this visit.  Visit Diagnosis:  Pain in joint of left shoulder      Subjective Assessment - 02/28/15 1109    Subjective pt reports she feels like her symptoms are less intense. she has been compliant with HEP and posture recommendations. she reports her pain/tightness is still int he L shoulder, neck and axilla    Limitations --  Typing   Currently in Pain? Yes   Pain Score 4    Pain Location Axilla       Manual: CPA glides grade III to T spine T 1-T10 3 bouts of 30s each level. Pt is more tender in upper T spine Proximal and distal median nerve passive glides 3x10 each Ischemic trigger point release to L upper trap and L pec minor  MET to L Pectoralis : contract relax  ThereX: Proximal and distal median nerve glide AROM x 10 each with min cues.                           PT Education - 02/27/15 1107    Education provided Yes   Education Details Progression of HEP as well as techniques, modifications to work station.    Person(s) Educated  Patient   Methods Explanation;Demonstration;Verbal cues;Handout   Comprehension Verbalized understanding;Returned demonstration             PT Long Term Goals - 02/21/15 1835    PT LONG TERM GOAL #1   Title pt will be able to sleep through the night without waking due to shoulder pain.    Time 4   Period Weeks   Status New   PT LONG TERM GOAL #2   Title pt will demonstrate proper sitting/standing posture needing <3 cues for correction during PT session    Time 4   Period Weeks   Status New   PT LONG TERM GOAL #3   Title pt will work a full day with no more than 3/10 pain in the shoulder    Time 4   Period Weeks   Status New   PT LONG TERM GOAL #4   Title pt will be independent and compliant with HEP   Time 4   Period Weeks   Status New               Plan - 02/28/15 1111    Clinical Impression Statement pt responded well to manual therapy today with addition of median nerve  glide. she was strongly positive to median nerve tension on the L side with reproduction of symptoms in the axilla. pt had reduction of symtoms to 2/10 following therapy. pt was given neural gliding for HEP.   Pt will benefit from skilled therapeutic intervention in order to improve on the following deficits Decreased strength;Hypomobility;Pain;Impaired flexibility;Postural dysfunction;Impaired UE functional use   Rehab Potential Good   PT Frequency 2x / week   PT Duration 4 weeks   PT Treatment/Interventions Cryotherapy;Electrical Stimulation;Iontophoresis 37m/ml Dexamethasone;Moist Heat;Traction;Ultrasound;Neuromuscular re-education;Therapeutic exercise;Therapeutic activities;Functional mobility training;Manual techniques;Dry needling;Taping   PT Next Visit Plan HEP        Problem List Patient Active Problem List   Diagnosis Date Noted  . Shoulder pain, left 01/24/2015  . PVC's (premature ventricular contractions) 04/22/2013  . Tachycardia 04/22/2013  . Hemochromatosis 04/22/2013  .  Palpitations 04/22/2013   AGorden Harms Adrienne Harris, PT, DPT #[phone] Adrienne Harris 02/28/2015, 11:14 AM  [place] [facility] [address] [address]: [phone]  Fax:  [phone] Name: Adrienne UNGERERMRN: [id number] of Birth: 101-Feb-1977

## 2015-02-28 NOTE — Patient Instructions (Signed)
[email or web address] Distal and proximal median nerve glides 2x10 2x/day

## 2015-03-06 ENCOUNTER — Ambulatory Visit: Payer: Managed Care, Other (non HMO)

## 2015-03-06 DIAGNOSIS — M25512 Pain in left shoulder: Secondary | ICD-10-CM

## 2015-03-06 NOTE — Therapy (Signed)
[place] MAIN [facility] SERVICES [address] [place], [place], [address] Phone: [phone]   Fax:  [phone]  Physical Therapy Treatment  Patient Details  Name: Adrienne Harris MRN: [id number] Date of Birth: [birth year] Referring Provider: Mack Guise  Encounter Date: 03/06/2015      PT End of Session - 03/06/15 1308    Visit Number 4   Number of Visits 9   Date for PT Re-Evaluation 03/21/15   PT Start Time 1100   PT Stop Time 1145   PT Time Calculation (min) 45 min   Activity Tolerance Patient tolerated treatment well   Behavior During Therapy [facility] for tasks assessed/performed      Past Medical History  Diagnosis Date  . Palpitations   . Basal cell carcinoma   . Hemochromatosis   . Atopic dermatitis   . Shoulder pain, right 01/24/2015  . Shoulder pain, left 01/24/2015    History reviewed. No pertinent past surgical history.  There were no vitals filed for this visit.  Visit Diagnosis:  Pain in joint of left shoulder      Subjective Assessment - 03/06/15 1306    Subjective pt reports her L shoulder pain is quite a bit better, she still feels more of the L axilla pain, but has improved overall.   Limitations --  Typing   Currently in Pain? Yes   Pain Score 4    Pain Location Axilla   Pain Orientation Left       Therex: Single arm pectoralis stretch in doorway LUE 30s x 2 Low row: red band 3x10 Shoulder IR/ER red band 2x10 each Prone shoulder horiz abduction 0lbs 2x10 Serratus punch bilateral supine 0lbs 2x15 SCM stretch 30s x 2 bilaterally Pt requires min verbal and tactile cues for proper exercise performance   Manual therapy Soft tissue massage to L upper trap, scalenes and pectoralis minor with ischemic trigger point release when appropriate L first rib mobilization with movement in supine x 5   pt reports reduced pain following session                          PT Education - 03/06/15 1307     Education provided Yes   Education Details initiated strengthening, therex, scalene/SCM stretch.   Person(s) Educated Patient   Methods Explanation   Comprehension Verbalized understanding             PT Long Term Goals - 02/21/15 1835    PT LONG TERM GOAL #1   Title pt will be able to sleep through the night without waking due to shoulder pain.    Time 4   Period Weeks   Status New   PT LONG TERM GOAL #2   Title pt will demonstrate proper sitting/standing posture needing <3 cues for correction during PT session    Time 4   Period Weeks   Status New   PT LONG TERM GOAL #3   Title pt will work a full day with no more than 3/10 pain in the shoulder    Time 4   Period Weeks   Status New   PT LONG TERM GOAL #4   Title pt will be independent and compliant with HEP   Time 4   Period Weeks   Status New               Plan - 03/06/15 1308    Clinical Impression Statement pt did well  with initiation of periscapluar strengthening and did demonstrate muscle weakness and fatigue in that area. also found was impaired flexibility to the scalenes and SCM with associated elevated 1st rib. depression of the first rib does seem to reproduce L axilla pain. first rib mobilization was performed today in conjuction with manual therapy. pt reported reduced symptoms following session.    Pt will benefit from skilled therapeutic intervention in order to improve on the following deficits Decreased strength;Hypomobility;Pain;Impaired flexibility;Postural dysfunction;Impaired UE functional use   Rehab Potential Good   PT Frequency 2x / week   PT Duration 4 weeks   PT Treatment/Interventions Cryotherapy;Electrical Stimulation;Iontophoresis 4mg /ml Dexamethasone;Moist Heat;Traction;Ultrasound;Neuromuscular re-education;Therapeutic exercise;Therapeutic activities;Functional mobility training;Manual techniques;Dry needling;Taping   PT Next Visit Plan HEP        Problem List Patient Active  Problem List   Diagnosis Date Noted  . Shoulder pain, left 01/24/2015  . PVC's (premature ventricular contractions) 04/22/2013  . Tachycardia 04/22/2013  . Hemochromatosis 04/22/2013  . Palpitations 04/22/2013   Gorden Harms. Maudry Zeidan, PT, DPT [phone]  Shadiyah Wernli 03/06/2015, 1:13 PM  [place] MAIN [facility] SERVICES [address] [place], [place], [address] Phone: [phone]   Fax:  [phone]  Name: CLELLA MCCOSH MRN: [id number] Date of Birth: 09-03-1975

## 2015-03-08 ENCOUNTER — Ambulatory Visit: Payer: Managed Care, Other (non HMO)

## 2015-03-08 DIAGNOSIS — M25512 Pain in left shoulder: Secondary | ICD-10-CM

## 2015-03-08 NOTE — Therapy (Signed)
[place] MAIN [facility] SERVICES [address] [place], [place], [address] Phone: [phone]   Fax:  [phone]  Physical Therapy Treatment  Patient Details  Name: HAZELY BREUNINGER MRN: [id number] Date of Birth: [birth year] Referring Provider: Mack Guise  Encounter Date: 03/08/2015      PT End of Session - 03/08/15 1708    Visit Number 5   Number of Visits 9   Date for PT Re-Evaluation 03/21/15   PT Start Time 1600   PT Stop Time 1650   PT Time Calculation (min) 50 min   Activity Tolerance Patient tolerated treatment well   Behavior During Therapy [facility] for tasks assessed/performed      Past Medical History  Diagnosis Date  . Palpitations   . Basal cell carcinoma   . Hemochromatosis   . Atopic dermatitis   . Shoulder pain, right 01/24/2015  . Shoulder pain, left 01/24/2015    History reviewed. No pertinent past surgical history.  There were no vitals filed for this visit.  Visit Diagnosis:  Pain in joint of left shoulder      Subjective Assessment - 03/08/15 1706    Subjective pt reports her axilla pain is a little better, she is having more shoulder pain today.    Limitations --  Typing   Currently in Pain? Yes   Pain Score 2    Pain Location --  L shoulder       Manual therapy CPA glides to T10-C7 grade III-IV 3 bouts of 20s  Lateral glides to C5-7 greade III 2 bouts of 30 Inferior first rib mobilizations grade 3 - 3 x 15 oscillations STM to L anterior neck and upper trap.  Pt first rib is less elevated  Therex:  low row yellow band 3x10 Shoulder IR/ER yellow band 2x10 Prone T and Y 2x10  Pt requires min verbal and tactile cues for proper exercise performance                            PT Education - 03/08/15 1708    Education provided Yes   Education Details importance of periscapular strength.    Person(s) Educated Patient   Methods Explanation   Comprehension Verbalized understanding              PT Long Term Goals - 02/21/15 1835    PT LONG TERM GOAL #1   Title pt will be able to sleep through the night without waking due to shoulder pain.    Time 4   Period Weeks   Status New   PT LONG TERM GOAL #2   Title pt will demonstrate proper sitting/standing posture needing <3 cues for correction during PT session    Time 4   Period Weeks   Status New   PT LONG TERM GOAL #3   Title pt will work a full day with no more than 3/10 pain in the shoulder    Time 4   Period Weeks   Status New   PT LONG TERM GOAL #4   Title pt will be independent and compliant with HEP   Time 4   Period Weeks   Status New               Plan - 03/08/15 1709    Clinical Impression Statement pt responded well to manual therapy and therex today. pt reports having less shoulder pain at end of session. pt demosntrates significant  lower and mid trap weakness.    Pt will benefit from skilled therapeutic intervention in order to improve on the following deficits Decreased strength;Hypomobility;Pain;Impaired flexibility;Postural dysfunction;Impaired UE functional use   Rehab Potential Good   PT Frequency 2x / week   PT Duration 4 weeks   PT Treatment/Interventions Cryotherapy;Electrical Stimulation;Iontophoresis 4mg /ml Dexamethasone;Moist Heat;Traction;Ultrasound;Neuromuscular re-education;Therapeutic exercise;Therapeutic activities;Functional mobility training;Manual techniques;Dry needling;Taping   PT Next Visit Plan HEP        Problem List Patient Active Problem List   Diagnosis Date Noted  . Shoulder pain, left 01/24/2015  . PVC's (premature ventricular contractions) 04/22/2013  . Tachycardia 04/22/2013  . Hemochromatosis 04/22/2013  . Palpitations 04/22/2013   Gorden Harms. Baylei Siebels, PT, DPT [phone]  Nickson Middlesworth 03/08/2015, 5:11 PM  [place] MAIN [facility] SERVICES [address] [place], [place], [address] Phone: [phone]   Fax:   [phone]  Name: CANDIDA GOULDER MRN: [id number] Date of Birth: 11/18/1975

## 2015-03-09 ENCOUNTER — Encounter: Payer: Self-pay | Admitting: Nurse Practitioner

## 2015-03-09 ENCOUNTER — Other Ambulatory Visit
Admission: RE | Admit: 2015-03-09 | Discharge: 2015-03-09 | Disposition: A | Discharge status: Home / Self Care | Payer: Managed Care, Other (non HMO) | Source: Ambulatory Visit | Attending: Nurse Practitioner | Admitting: Nurse Practitioner

## 2015-03-09 ENCOUNTER — Ambulatory Visit (INDEPENDENT_AMBULATORY_CARE_PROVIDER_SITE_OTHER): Payer: Managed Care, Other (non HMO) | Admitting: Nurse Practitioner

## 2015-03-09 VITALS — BP 130/82 | HR 75 | Ht 66.0 in | Wt 129.0 lb

## 2015-03-09 DIAGNOSIS — I493 Ventricular premature depolarization: Secondary | ICD-10-CM

## 2015-03-09 DIAGNOSIS — R002 Palpitations: Secondary | ICD-10-CM | POA: Diagnosis not present

## 2015-03-09 LAB — BASIC METABOLIC PANEL
Anion gap: 8 (ref 5–15)
BUN: 12 mg/dL (ref 6–20)
CHLORIDE: 106 mmol/L (ref 101–111)
CO2: 27 mmol/L (ref 22–32)
CREATININE: 0.71 mg/dL (ref 0.44–1.00)
Calcium: 9.1 mg/dL (ref 8.9–10.3)
GFR calc Af Amer: >60 mL/min (ref >60)
GFR calc non Af Amer: >60 mL/min (ref >60)
Glucose, Bld: 109 mg/dL — ABNORMAL HIGH (ref 65–99)
Potassium: 3.8 mmol/L (ref 3.5–5.1)
SODIUM: 141 mmol/L (ref 135–145)

## 2015-03-09 LAB — MAGNESIUM: Magnesium: 2.2 mg/dL (ref 1.7–2.4)

## 2015-03-09 LAB — TSH: TSH: 0.658 u[IU]/mL (ref 0.350–4.500)

## 2015-03-09 MED ORDER — PROPRANOLOL HCL 20 MG PO TABS: 20.0000 mg | ORAL_TABLET | Freq: Three times a day (TID) | ORAL | Status: DC | PRN

## 2015-03-09 NOTE — Patient Instructions (Signed)
Medication Instructions:  Your physician recommends that you continue on your current medications as directed. Please refer to the Current Medication list given to you today.   Labwork: BMET, mag, TSH  Testing/Procedures: none  Follow-Up: Your physician wants you to follow-up in: one year with Dr. Rockey Situ.  You will receive a reminder letter in the mail two months in advance. If you don't receive a letter, please call our office to schedule the follow-up appointment.   Any Other Special Instructions Will Be Listed Below (If Applicable).     If you need a refill on your cardiac medications before your next appointment, please call your pharmacy.

## 2015-03-09 NOTE — Progress Notes (Signed)
Office Visit    Patient Name: BRECKLYNN CURE Date of Encounter: 03/09/2015  Primary Care Provider:  No PCP Per Patient Primary Cardiologist:  Johnny Bridge, MD   Chief Complaint    40 year old female with a history of palpitations who presents for follow-up.  Past Medical History    Past Medical History  Diagnosis Date  . Palpitations     a. 04/2013 48h Holter: sinus rhythm/sinus arrhythmia, rare PAC's/PVC's.  . Basal cell carcinoma   . Hemochromatosis   . Atopic dermatitis   . Shoulder pain, right 01/24/2015  . Shoulder pain, left 01/24/2015   History reviewed. No pertinent past surgical history.  Allergies  No Known Allergies  History of Present Illness    40 year old female with a history of palpitations and hemochromatosis. She was last seen in clinic in March 2015, at which time she complained of palpitations and tachycardia. She underwent 48 hour Holter monitoring which showed rare PACs and PVCs. She did not have any sustained arrhythmias. She was placed on propranolol 20 mg 3 times a day when necessary. Following that visit, she tried to reduce stress in her life and also avoid caffeine. She continued to have occasional palpitations, though not so much that she felt as though she needed to take any beta blocker. She has been dealing with left shoulder and axillary/chest discomfort, which has been determined to be musk as skeletal nature. She has been seen by orthopedics and also referred to physical therapy. She is currently undergoing physical therapy and feels as though this has helped.  Since December 2016, she has had some increase in her burden of palpitations, occurring throughout the day without associated symptoms. They are somewhat bothersome and as a result, she began to take propranolol. Over the past month, she has been taking at least 20 mg once a day and sometimes she'll take a dose in the evening as well. This has significantly helped her burden of palpitations.  She presents today for reevaluation and refill on propranolol.  She has not been having exertional chest pain or dyspnea. Further, she denies PND, orthopnea, dizziness, syncope, edema, or early satiety. She works as a Microbiologist at [employer] and is relatively active without significant limitations.  Home Medications    Prior to Admission medications   Medication Sig Start Date End Date Taking? Authorizing Provider  levonorgestrel-ethinyl estradiol (NORDETTE) 0.15-30 MG-MCG tablet Take 1 tablet by mouth daily.   Yes Historical Provider, MD  naproxen sodium (ANAPROX) 550 MG tablet Take 550 mg by mouth 2 (two) times daily as needed. Takes PRN around menstrual cycle due to HA   Yes Historical Provider, MD  propranolol (INDERAL) 20 MG tablet Take 1 tablet (20 mg total) by mouth 3 (three) times daily. 01/25/15  Yes Minna Merritts, MD    Review of Systems    Palpitations as above. She also has been having left sided chest, axillary, shoulder musculoskeletal discomfort which has improved with physical therapy.  All other systems reviewed and are otherwise negative except as noted above.  Physical Exam    VS:  BP 130/82 mmHg  Pulse 75  Ht 5\' 6"  (1.676 m)  Wt 129 lb (58.514 kg)  BMI 20.83 kg/m2 , BMI Body mass index is 20.83 kg/(m^2). GEN: Well nourished, well developed, in no acute distress. HEENT: normal. Neck: Supple, no JVD, carotid bruits, or masses. Cardiac: RRR, no murmurs, rubs, or gallops. No clubbing, cyanosis, edema.  Radials/DP/PT 2+ and equal bilaterally.  Respiratory:  Respirations regular and unlabored, clear to auscultation bilaterally. GI: Soft, nontender, nondistended, BS + x 4. MS: no deformity or atrophy. Skin: warm and dry, no rash. Neuro:  Strength and sensation are intact. Psych: Normal affect.  Accessory Clinical Findings    ECG - sinus arrhythmia, 75, no acute ST or T changes.  Assessment & Plan    1.  Palpitations: Patient has a history  of palpitations dating back to March 2015 at which time she wore a 48-hour Holter monitor showing rare PACs and PVCs. There were no sustained or malignant arrhythmias. Over the past month, she has had an increase in burden of palpitations and has been taking propranolol 20 mg daily and sometimes twice a day. This has improved her burden of palpitations. She has no other associated symptoms. She did have a basic metabolic panel in December which revealed mild hypokalemia with potassium of 3.5. I will repeat a basic metabolic panel as well as magnesium and TSH today. She needs refills on her propranolol, which prompted this visit. I will refill this at 20 mg 3 times a day when necessary with 12 refills. Plan to follow-up in one year.  2. Hemachromatosis: She has quarterly blood work at the cancer center. She has not required phlebotomy in over a year.  3. Disposition: Labs today. Follow-up in one year.   Murray Hodgkins, NP 03/09/2015, 8:29 AM

## 2015-03-13 ENCOUNTER — Ambulatory Visit: Payer: Managed Care, Other (non HMO)

## 2015-03-13 DIAGNOSIS — M25512 Pain in left shoulder: Secondary | ICD-10-CM | POA: Diagnosis not present

## 2015-03-13 NOTE — Therapy (Signed)
[place] MAIN [facility] SERVICES [address] [place], [place], [address] Phone: [phone]   Fax:  [phone]  Physical Therapy Treatment  Patient Details  Name: LARIANA PULLAN MRN: [id number] Date of Birth: November 14, 1975 Referring Provider: Mack Guise  Encounter Date: 03/13/2015      PT End of Session - 03/13/15 1659    Visit Number 6   Number of Visits 9   Date for PT Re-Evaluation 03/21/15   PT Start Time 1600   PT Stop Time 1645   PT Time Calculation (min) 45 min   Activity Tolerance Patient tolerated treatment well   Behavior During Therapy [facility] for tasks assessed/performed      Past Medical History  Diagnosis Date  . Palpitations     a. 04/2013 48h Holter: sinus rhythm/sinus arrhythmia, rare PAC's/PVC's.  . Basal cell carcinoma   . Hemochromatosis   . Atopic dermatitis   . Shoulder pain, right 01/24/2015  . Shoulder pain, left 01/24/2015    History reviewed. No pertinent past surgical history.  There were no vitals filed for this visit.  Visit Diagnosis:  Pain in joint of left shoulder      Subjective Assessment - 03/13/15 1657    Subjective pt reports her pain is getting a little better. pt reports compliance with HEP   Limitations --  Typing   Currently in Pain? Yes   Pain Score 2    Pain Location --  L axilla and L lateral shoulder         Manual therapy CPA glides to T10-C7 grade III-IV 3 bouts of 20s  Lateral glides to C5-7 greade III 2 bouts of 30 STM to L anterior neck and upper trap.  STM to L pectoralis minor GH mobs inferior and AP grade 3 5 bouts of 15s. Cavitation noted x3 with AP mob- no pain PROM into L shoulder extension x 10 Long axis distraction of the L shoulder grade III 3 bouts of oscillations x 15s Therex: low row yellow band 3x10 Shoulder IR/ER yellow band 2x10 Wall slide into V with lift off (lower trap activation) 2x10 Pt reports shoulder fatigue Pt has some pain with shoulder IR at  0deg elevation. Pt has less pain with shoulder IR at  90/90  Pt requires min verbal and tactile cues for proper exercise performance                          PT Education - 03/13/15 1658    Education provided Yes   Education Details therex   Person(s) Educated Patient   Methods Explanation   Comprehension Verbalized understanding             PT Long Term Goals - 02/21/15 1835    PT LONG TERM GOAL #1   Title pt will be able to sleep through the night without waking due to shoulder pain.    Time 4   Period Weeks   Status New   PT LONG TERM GOAL #2   Title pt will demonstrate proper sitting/standing posture needing <3 cues for correction during PT session    Time 4   Period Weeks   Status New   PT LONG TERM GOAL #3   Title pt will work a full day with no more than 3/10 pain in the shoulder    Time 4   Period Weeks   Status New   PT LONG TERM GOAL #4   Title  pt will be independent and compliant with HEP   Time 4   Period Weeks   Status New               Plan - 03/13/15 1700    Clinical Impression Statement pt is gradually improving. pt has less tightness in L pec. pt L first rib is no longer elevated or painful today. PT gradually progressing strength. pt also has reduced pain with median nerve tension testing.    Pt will benefit from skilled therapeutic intervention in order to improve on the following deficits Decreased strength;Hypomobility;Pain;Impaired flexibility;Postural dysfunction;Impaired UE functional use   Rehab Potential Good   PT Frequency 2x / week   PT Duration 4 weeks   PT Treatment/Interventions Cryotherapy;Electrical Stimulation;Iontophoresis 4mg /ml Dexamethasone;Moist Heat;Traction;Ultrasound;Neuromuscular re-education;Therapeutic exercise;Therapeutic activities;Functional mobility training;Manual techniques;Dry needling;Taping   PT Next Visit Plan HEP        Problem List Patient Active Problem List   Diagnosis Date  Noted  . Shoulder pain, left 01/24/2015  . PVC's (premature ventricular contractions) 04/22/2013  . Tachycardia 04/22/2013  . Hemochromatosis 04/22/2013  . Palpitations 04/22/2013   Gorden Harms. Jemina Scahill, PT, DPT [phone]  Carrieanne Kleen 03/13/2015, 5:02 PM  [place] MAIN [facility] SERVICES [address] [place], [place], [address] Phone: [phone]   Fax:  [phone]  Name: TYLAR OLLIVIERRE MRN: [id number] Date of Birth: [birth year]

## 2015-03-14 ENCOUNTER — Ambulatory Visit: Payer: Managed Care, Other (non HMO) | Attending: Orthopedic Surgery

## 2015-03-14 DIAGNOSIS — M25512 Pain in left shoulder: Secondary | ICD-10-CM | POA: Insufficient documentation

## 2015-03-14 NOTE — Therapy (Signed)
[place] MAIN [facility] SERVICES [address] [place], [place], [address] Phone: [phone]   Fax:  [phone]  Physical Therapy Treatment  Patient Details  Name: Adrienne Harris MRN: [id number] Date of Birth: Feb 04, 1976 Referring Provider: Mack Guise  Encounter Date: 03/14/2015      PT End of Session - 03/14/15 1810    Visit Number 7   Number of Visits 9   Date for PT Re-Evaluation 03/21/15   PT Start Time 3009   PT Stop Time 1600   PT Time Calculation (min) 45 min   Activity Tolerance Patient tolerated treatment well   Behavior During Therapy [facility] for tasks assessed/performed      Past Medical History  Diagnosis Date  . Palpitations     a. 04/2013 48h Holter: sinus rhythm/sinus arrhythmia, rare PAC's/PVC's.  . Basal cell carcinoma   . Hemochromatosis   . Atopic dermatitis   . Shoulder pain, right 01/24/2015  . Shoulder pain, left 01/24/2015    History reviewed. No pertinent past surgical history.  There were no vitals filed for this visit.  Visit Diagnosis:  Pain in joint of left shoulder      Subjective Assessment - 03/14/15 1809    Subjective pt reports her L periscapular area is sore today as well as the L axilla   Limitations --  Typing   Currently in Pain? Yes   Pain Score 3    Pain Location --  L periscapular area and axilla     Manual therapy:  Extensive manual therapy including Soft tissue massage to L periscapular area including rhomboids, infra/supraspinatus, upper trap, middle trap and lower trap. This included efflurage, muscle stripping, Ischemic trigger point release IASTM to L rhomboids and L upper trap using the edge tool brief CPA glides to T spine T1-8 grade III 1 bouts of 20s each level Supine MET to depress L first rib Soft tissue massage to L anterior neck musculature Contract relax stretch to L latissiumus dorsi Soft tissue massage and Ischemic trigger point release to L teres minor/major and L  subscapularis.  Sunrise stretch over [employer] 20s x 3   Pt reported much reduced pain to periscapular area. Some relief of axilla pain                          PT Education - 03/13/15 1658    Education provided Yes   Education Details therex   Person(s) Educated Patient   Methods Explanation   Comprehension Verbalized understanding             PT Long Term Goals - 02/21/15 1835    PT LONG TERM GOAL #1   Title pt will be able to sleep through the night without waking due to shoulder pain.    Time 4   Period Weeks   Status New   PT LONG TERM GOAL #2   Title pt will demonstrate proper sitting/standing posture needing <3 cues for correction during PT session    Time 4   Period Weeks   Status New   PT LONG TERM GOAL #3   Title pt will work a full day with no more than 3/10 pain in the shoulder    Time 4   Period Weeks   Status New   PT LONG TERM GOAL #4   Title pt will be independent and compliant with HEP   Time 4   Period Weeks   Status New  Plan - 03/14/15 1811    Clinical Impression Statement pt had relief after extensive manual therapy today to the periscapular area where multiple fascial restrictions and trigger points were found. pt also has trigger points in the L teres major/minor and sub scapular area which can be palpated with end range shoulder flexion. pt may have some latissimus tightness as well. pts L rib was again elevated today. L sided anterior neck musculature is also tight.    Pt will benefit from skilled therapeutic intervention in order to improve on the following deficits Decreased strength;Hypomobility;Pain;Impaired flexibility;Postural dysfunction;Impaired UE functional use   Rehab Potential Good   PT Frequency 2x / week   PT Duration 4 weeks   PT Treatment/Interventions Cryotherapy;Electrical Stimulation;Iontophoresis 76m/ml Dexamethasone;Moist Heat;Traction;Ultrasound;Neuromuscular  re-education;Therapeutic exercise;Therapeutic activities;Functional mobility training;Manual techniques;Dry needling;Taping   PT Next Visit Plan HEP        Problem List Patient Active Problem List   Diagnosis Date Noted  . Shoulder pain, left 01/24/2015  . PVC's (premature ventricular contractions) 04/22/2013  . Tachycardia 04/22/2013  . Hemochromatosis 04/22/2013  . Palpitations 04/22/2013   AGorden Harms Blimy Napoleon, PT, DPT #[id number] Verdis Bassette 03/14/2015, 6:14 PM  [place] [facility] [address] [place] [address]Phone: [phone]  Fax:  [phone] Name: Adrienne OSBOURNMRN: [id number] of Birth: 1Dec 17, 1977

## 2015-03-20 ENCOUNTER — Ambulatory Visit: Payer: Managed Care, Other (non HMO)

## 2015-03-20 DIAGNOSIS — M25512 Pain in left shoulder: Secondary | ICD-10-CM

## 2015-03-20 NOTE — Therapy (Signed)
[place] MAIN [facility] SERVICES [address] [place], [place], [address] Phone: [phone]   Fax:  [phone]  Physical Therapy Evaluation  Patient Details  Name: Adrienne Harris MRN: [id number] Date of Birth: 1975/07/20 Referring Provider: Mack Guise  Encounter Date: 03/20/2015      PT End of Session - 03/20/15 1800    Visit Number 8   Number of Visits 9   Date for PT Re-Evaluation 03/21/15   PT Start Time [id number]   PT Stop Time 1650   PT Time Calculation (min) 43 min   Activity Tolerance Patient tolerated treatment well   Behavior During Therapy [facility] for tasks assessed/performed      Past Medical History  Diagnosis Date  . Palpitations     a. 04/2013 48h Holter: sinus rhythm/sinus arrhythmia, rare PAC's/PVC's.  . Basal cell carcinoma   . Hemochromatosis   . Atopic dermatitis   . Shoulder pain, right 01/24/2015  . Shoulder pain, left 01/24/2015    History reviewed. No pertinent past surgical history.  There were no vitals filed for this visit.  Visit Diagnosis:  Pain in joint of left shoulder      Subjective Assessment - 03/20/15 1757    Subjective pt reports she had a bad day friday with a lot of pain, but feels "better than she has" today and yesterday   Limitations --  Typing   Pain Score 2    Pain Location --  upper trap L and L axilla   Pain Descriptors / Indicators Aching      Manual therapy.  Extensive soft tissue mobilization to L upper trap, rhomboids, pec minor and teres major/minor Ischemic trigger point release to L upper trap and pec minor CPA mobs grade III to T spine T2-8 3 xbouts x 15s Patient received dry needling therapy education and acknowledged understanding of risks and benefits of dry needling therapy prior to receiving treatment. Patient voiced understanding of treatment options and elected to proceed with dry needling therapy.   Trigger point dry needling to L upper trap and pec minor. Elicited twitch  response in both areas with pt verbalizing deep ache.  Upper trap stretch 20s x 3 Doorway stretch 20s x 3                         PT Education - 03/20/15 1758    Education provided Yes   Education Details dry needling education   Person(s) Educated Patient   Methods Explanation   Comprehension Verbalized understanding             PT Long Term Goals - 02/21/15 1835    PT LONG TERM GOAL #1   Title pt will be able to sleep through the night without waking due to shoulder pain.    Time 4   Period Weeks   Status New   PT LONG TERM GOAL #2   Title pt will demonstrate proper sitting/standing posture needing <3 cues for correction during PT session    Time 4   Period Weeks   Status New   PT LONG TERM GOAL #3   Title pt will work a full day with no more than 3/10 pain in the shoulder    Time 4   Period Weeks   Status New   PT LONG TERM GOAL #4   Title pt will be independent and compliant with HEP   Time 4   Period Weeks  Status New               Plan - 03/20/15 1801    Clinical Impression Statement pt demonstrated reduced periscapular tightness vs last visit. pt does have large trigger point in teres major, pec minor and L upper trap. PT also performed dry needling to the left upper trap and L pec minor. pt did elicit twitch response to both areas. pt reported reduced tightness following session, and no aching in the L upper trap.    Pt will benefit from skilled therapeutic intervention in order to improve on the following deficits Decreased strength;Hypomobility;Pain;Impaired flexibility;Postural dysfunction;Impaired UE functional use   Rehab Potential Good   PT Frequency 2x / week   PT Duration 4 weeks   PT Treatment/Interventions Cryotherapy;Electrical Stimulation;Iontophoresis 4mg /ml Dexamethasone;Moist Heat;Traction;Ultrasound;Neuromuscular re-education;Therapeutic exercise;Therapeutic activities;Functional mobility training;Manual  techniques;Dry needling;Taping   PT Next Visit Plan HEP         Problem List Patient Active Problem List   Diagnosis Date Noted  . Shoulder pain, left 01/24/2015  . PVC's (premature ventricular contractions) 04/22/2013  . Tachycardia 04/22/2013  . Hemochromatosis 04/22/2013  . Palpitations 04/22/2013    Tatsuo Musial 03/20/2015, 6:06 PM  [place] MAIN [facility] SERVICES [address] [place], [place], [address] Phone: [phone]   Fax:  [phone]  Name: Adrienne Harris MRN: [id number] Date of Birth: [birth year]

## 2015-03-22 ENCOUNTER — Ambulatory Visit: Payer: Managed Care, Other (non HMO)

## 2015-03-22 DIAGNOSIS — M25512 Pain in left shoulder: Secondary | ICD-10-CM

## 2015-03-22 NOTE — Therapy (Signed)
[place] MAIN [facility] SERVICES [address] [place], [place], [address] Phone: [phone]   Fax:  [phone]  Physical Therapy Treatment  Patient Details  Name: Adrienne Harris MRN: [id number] Date of Birth: Jun 14, 1975 Referring Provider: Mack Guise  Encounter Date: 03/22/2015      PT End of Session - 03/22/15 1740    Visit Number 9   Number of Visits 17   Date for PT Re-Evaluation 04/19/15   PT Start Time 1600   PT Stop Time 1650   PT Time Calculation (min) 50 min   Activity Tolerance Patient tolerated treatment well   Behavior During Therapy [facility] for tasks assessed/performed      Past Medical History  Diagnosis Date  . Palpitations     a. 04/2013 48h Holter: sinus rhythm/sinus arrhythmia, rare PAC's/PVC's.  . Basal cell carcinoma   . Hemochromatosis   . Atopic dermatitis   . Shoulder pain, right 01/24/2015  . Shoulder pain, left 01/24/2015    History reviewed. No pertinent past surgical history.  There were no vitals filed for this visit.  Visit Diagnosis:  Pain in joint of left shoulder - Plan: PT plan of care cert/re-cert      Subjective Assessment - 03/22/15 1739    Subjective pt reports her arm is feeling a lot better. it does not hurt nearly as much.    Limitations --  Typing   Patient Stated Goals reduce pain   Currently in Pain? Yes   Pain Score 2    Pain Orientation --  L shoulder axilla area.       Manual therapy: Extensive soft tissue mobilization to L pec minor/major, teres minor/major, subscapularis lateral border Ischemic trigger point release to L pec minor, teres minor/major and subscapularis  Therex: Wall slide with lift off 2x10 with min-mod cues needed for proper scap retraction Low row red band 3x10 Serratus _+ on wall                            PT Education - 03/22/15 1739    Education provided Yes   Education Details progress towards goals, POC   Person(s) Educated  Patient   Methods Explanation   Comprehension Verbalized understanding             PT Long Term Goals - 03/22/15 1741    PT LONG TERM GOAL #1   Title pt will be able to sleep through the night without waking due to shoulder pain.    Time 4   Period Weeks   Status Partially Met   PT LONG TERM GOAL #2   Title pt will demonstrate proper sitting/standing posture needing <3 cues for correction during PT session    Time 4   Period Weeks   Status Achieved   PT LONG TERM GOAL #3   Title pt will work a full day with no more than 3/10 pain in the shoulder    Time 4   Period Weeks   Status Partially Met   PT LONG TERM GOAL #4   Title pt will be independent and compliant with HEP   Time 4   Period Weeks   Status Achieved   PT LONG TERM GOAL #5   Title pt will be able to work 1 week full time wiht less than 3/10 pain   Time 4   Period Weeks   Status New   Additional Long Term Goals  Additional Long Term Goals Yes   PT LONG TERM GOAL #6   Title pt will report no more than 10% disability on quick dash   Time 4   Period Weeks   Status New               Plan - 03/22/15 1740    Clinical Impression Statement pt is making good progress towards goals. she relates her pain is much less than it was before. she is able to sleep better at night. pt has partially met goals at this time, but would benefit from continued skilled PT services to further address pain, flexibility, strength to return pt to PLOF. pt still has quite a bit of pec tightness.    Pt will benefit from skilled therapeutic intervention in order to improve on the following deficits Decreased strength;Hypomobility;Pain;Impaired flexibility;Postural dysfunction;Impaired UE functional use   Rehab Potential Good   PT Frequency 2x / week   PT Duration 4 weeks   PT Treatment/Interventions Cryotherapy;Electrical Stimulation;Iontophoresis 38m/ml Dexamethasone;Moist Heat;Traction;Ultrasound;Neuromuscular  re-education;Therapeutic exercise;Therapeutic activities;Functional mobility training;Manual techniques;Dry needling;Taping   PT Next Visit Plan HEP        Problem List Patient Active Problem List   Diagnosis Date Noted  . Shoulder pain, left 01/24/2015  . PVC's (premature ventricular contractions) 04/22/2013  . Tachycardia 04/22/2013  . Hemochromatosis 04/22/2013  . Palpitations 04/22/2013    Nuvia Hileman 03/22/2015, 5:44 PM  [place] [facility]osp[facility] [address] [place] [address]Phone: [phone]  Fax:  [phone] Name: Adrienne MASSINGALEMRN: [id number] of Birth: [birth year]

## 2015-03-26 ENCOUNTER — Ambulatory Visit: Payer: Managed Care, Other (non HMO)

## 2015-03-26 DIAGNOSIS — M25512 Pain in left shoulder: Secondary | ICD-10-CM | POA: Diagnosis not present

## 2015-03-26 NOTE — Therapy (Signed)
[place] MAIN [facility] SERVICES [address] [place], [place], [address] Phone: [phone]   Fax:  [phone]  Physical Therapy Treatment  Patient Details  Name: Adrienne Harris MRN: [id number] Date of Birth: 09/02/1975 Referring Provider: Mack Guise  Encounter Date: 03/26/2015      PT End of Session - 03/26/15 1140    Visit Number 10   Number of Visits 17   Date for PT Re-Evaluation 04/19/15   PT Start Time 1100   PT Stop Time 1141   PT Time Calculation (min) 41 min   Activity Tolerance Patient tolerated treatment well   Behavior During Therapy [facility] for tasks assessed/performed      Past Medical History  Diagnosis Date  . Palpitations     a. 04/2013 48h Holter: sinus rhythm/sinus arrhythmia, rare PAC's/PVC's.  . Basal cell carcinoma   . Hemochromatosis   . Atopic dermatitis   . Shoulder pain, right 01/24/2015  . Shoulder pain, left 01/24/2015    History reviewed. No pertinent past surgical history.  There were no vitals filed for this visit.  Visit Diagnosis:  Pain in joint of left shoulder      Subjective Assessment - 03/26/15 1139    Subjective pt reports she had 1 painful day last week, but overall her pain is much less frequent and less intense.    Limitations --  Typing   Patient Stated Goals reduce pain   Currently in Pain? Yes   Pain Score 1    Pain Location --  L upper trap and anterior shoulder   Pain Descriptors / Indicators Aching       Therex: UBE x 2 min no charge warm up L1.5  Mid row on cable column 12 lbs 2x10 Low row on cable column, 5lbs 2x10 Hitch-hikers yellow band 2x10 Prone horiz abduction with ER LUE 2x10 Prone shoulder flexion with manual scap assist LUE 1x10 then 1x8 due to fatigue Pt requires min verbal and tactile cues for proper exercise performance  Including scapular retraction  Manual therapy: GH inferior and posterior glides grade 3 - 3 bouts of 15s Long axis distraction grade 3 3  x 15 s PROM into shoulder flexion with MET at end range into shoulder extension and adduction (for pec major) x 10 PROM into shoulder abduction with MET at 75% end ranges into shoulder adduction for lat (contract relax) x 10 Prayer stretch 30s x 4 initially had tightness at end range flexion/abduction with mild muscular resistance, this was resovled following treatment .                          PT Education - 03/26/15 1140    Education provided Yes   Education Details prayer stretch for bilateral lats   Person(s) Educated Patient   Methods Explanation   Comprehension Verbalized understanding;Returned demonstration             PT Long Term Goals - 03/22/15 1741    PT LONG TERM GOAL #1   Title pt will be able to sleep through the night without waking due to shoulder pain.    Time 4   Period Weeks   Status Partially Met   PT LONG TERM GOAL #2   Title pt will demonstrate proper sitting/standing posture needing <3 cues for correction during PT session    Time 4   Period Weeks   Status Achieved   PT LONG TERM GOAL #3  Title pt will work a full day with no more than 3/10 pain in the shoulder    Time 4   Period Weeks   Status Partially Met   PT LONG TERM GOAL #4   Title pt will be independent and compliant with HEP   Time 4   Period Weeks   Status Achieved   PT LONG TERM GOAL #5   Title pt will be able to work 1 week full time wiht less than 3/10 pain   Time 4   Period Weeks   Status New   Additional Long Term Goals   Additional Long Term Goals Yes   PT LONG TERM GOAL #6   Title pt will report no more than 10% disability on quick dash   Time 4   Period Weeks   Status New               Plan - 03/26/15 1141    Clinical Impression Statement pt continues to gradually improve with PT. progressed strengthening today with very little increased pain. pt responded well to PROM and muscle energy tequnique (contract relax) to stretch the L  pectoralis and latissiums dorsi. pt had 0/10 pain at end of session.   Pt will benefit from skilled therapeutic intervention in order to improve on the following deficits Decreased strength;Hypomobility;Pain;Impaired flexibility;Postural dysfunction;Impaired UE functional use   Rehab Potential Good   PT Frequency 2x / week   PT Duration 4 weeks   PT Treatment/Interventions Cryotherapy;Electrical Stimulation;Iontophoresis 40m/ml Dexamethasone;Moist Heat;Traction;Ultrasound;Neuromuscular re-education;Therapeutic exercise;Therapeutic activities;Functional mobility training;Manual techniques;Dry needling;Taping        Problem List Patient Active Problem List   Diagnosis Date Noted  . Shoulder pain, left 01/24/2015  . PVC's (premature ventricular contractions) 04/22/2013  . Tachycardia 04/22/2013  . Hemochromatosis 04/22/2013  . Palpitations 04/22/2013   AGorden Harms Laurena Valko, PT, DPT #[id number] Adrienne Harris 03/26/2015, 11:47 AM  [place] KeyMAIN RGsi [facility] LLCSERVICES [address] [address]Phone: [phone]  Fax:  [phone] Name: Adrienne WARRIORMRN: [id number] of Birth: 112-05-1975

## 2015-03-28 ENCOUNTER — Ambulatory Visit: Payer: Managed Care, Other (non HMO)

## 2015-03-28 DIAGNOSIS — M25512 Pain in left shoulder: Secondary | ICD-10-CM

## 2015-03-28 NOTE — Therapy (Signed)
[place] MAIN [facility] SERVICES [address] [place], [place], [address] Phone: [phone]   Fax:  [phone]  Physical Therapy Treatment  Patient Details  Name: Adrienne Harris MRN: [id number] Date of Birth: [birth year] Referring Provider: Mack Guise  Encounter Date: 03/28/2015      PT End of Session - 03/28/15 1718    Visit Number 11   Number of Visits 17   Date for PT Re-Evaluation 04/19/15   PT Start Time 3614   PT Stop Time 1600   PT Time Calculation (min) 45 min   Activity Tolerance Patient tolerated treatment well   Behavior During Therapy [facility] for tasks assessed/performed      Past Medical History  Diagnosis Date  . Palpitations     a. 04/2013 48h Holter: sinus rhythm/sinus arrhythmia, rare PAC's/PVC's.  . Basal cell carcinoma   . Hemochromatosis   . Atopic dermatitis   . Shoulder pain, right 01/24/2015  . Shoulder pain, left 01/24/2015    History reviewed. No pertinent past surgical history.  There were no vitals filed for this visit.  Visit Diagnosis:  Pain in joint of left shoulder      Subjective Assessment - 03/28/15 1716    Subjective pt reports having a little more pain the last few days. 3/10 to the L axilla and shoulder.    Limitations --  Typing   Patient Stated Goals reduce pain   Currently in Pain? Yes   Pain Score 3    Pain Location --  axilla and shoulder    Pain Orientation Left   Pain Descriptors / Indicators Aching      Therex: UBE x 2 min no charge warm up L1.5  Low row on cable column, 5lbs 2x10 Lat pull on cable column 7.5lbs 2x10 Prone horiz abduction with ER 2x10 sidelying shoulder ER with 1lb 2x10 sidelying L shoulder FF 1lb 2x10 Wall slide with lift off 2x10  Pt requires min verbal and tactile cues for proper exercise performance Including scapular retraction  Manual therapy: GH inferior and posterior glides grade 3 - 3 bouts of 15s Patient received dry needling therapy education  and acknowledged understanding of risks and benefits of dry needling therapy prior to receiving treatment. Patient voiced understanding of treatment options and elected to proceed with dry needling therapy.   Trigger point dry needling to L pec minor. Twitch response was noted.  PROM into shoulder flexion with MET at end range into shoulder extension and adduction (for pec major) x 10 PROM into shoulder abduction with MET at 75% end ranges into shoulder adduction for lat (contract relax) x 10 Prayer stretch 30s x 4 initially had tightness at end range flexion/abduction with mild muscular resistance, this was resovled following treatment .                                PT Long Term Goals - 03/22/15 1741    PT LONG TERM GOAL #1   Title pt will be able to sleep through the night without waking due to shoulder pain.    Time 4   Period Weeks   Status Partially Met   PT LONG TERM GOAL #2   Title pt will demonstrate proper sitting/standing posture needing <3 cues for correction during PT session    Time 4   Period Weeks   Status Achieved   PT LONG TERM GOAL #3   Title pt  will work a full day with no more than 3/10 pain in the shoulder    Time 4   Period Weeks   Status Partially Met   PT LONG TERM GOAL #4   Title pt will be independent and compliant with HEP   Time 4   Period Weeks   Status Achieved   PT LONG TERM GOAL #5   Title pt will be able to work 1 week full time wiht less than 3/10 pain   Time 4   Period Weeks   Status New   Additional Long Term Goals   Additional Long Term Goals Yes   PT LONG TERM GOAL #6   Title pt will report no more than 10% disability on quick dash   Time 4   Period Weeks   Status New               Plan - 03/28/15 1718    Clinical Impression Statement pt had increased tightness to the L pectoralis today. PT performed trigger point dry needling to the associated trigger point in the pec minor which elicited twitch  response. PT continues to work on soft tissue mobility in addition to periscapular and RTC strengthening.    Pt will benefit from skilled therapeutic intervention in order to improve on the following deficits Decreased strength;Hypomobility;Pain;Impaired flexibility;Postural dysfunction;Impaired UE functional use   Rehab Potential Good   PT Frequency 2x / week   PT Duration 4 weeks   PT Treatment/Interventions Cryotherapy;Electrical Stimulation;Iontophoresis 45m/ml Dexamethasone;Moist Heat;Traction;Ultrasound;Neuromuscular re-education;Therapeutic exercise;Therapeutic activities;Functional mobility training;Manual techniques;Dry needling;Taping        Problem List Patient Active Problem List   Diagnosis Date Noted  . Shoulder pain, left 01/24/2015  . PVC's (premature ventricular contractions) 04/22/2013  . Tachycardia 04/22/2013  . Hemochromatosis 04/22/2013  . Palpitations 04/22/2013   AGorden Harms Andres Harris, PT, DPT #[phone] Adrienne Harris 03/28/2015, 5:20 PM  CNorth[place]MAIN [facility]ICES [address] [address]Phone: [phone]  Fax:  [phone] Name: Adrienne BORAKMRN: [id number] of Birth: 1Aug 02, 1977

## 2015-04-02 ENCOUNTER — Ambulatory Visit: Payer: Managed Care, Other (non HMO)

## 2015-04-02 DIAGNOSIS — M25512 Pain in left shoulder: Secondary | ICD-10-CM

## 2015-04-02 NOTE — Patient Instructions (Signed)
[email or web address] Prone YTW 4x5 0lbs Low row red band 2x10 Supine ABCs with 1lb Modified full plank 30s x 3 sidelying ER 1lb 3x10

## 2015-04-02 NOTE — Therapy (Signed)
[place] MAIN [facility] SERVICES [address] [place], [place], [address] Phone: [phone]   Fax:  [phone]  Physical Therapy Treatment  Patient Details  Name: Adrienne Harris MRN: [id number] Date of Birth: September 23, 1975 Referring Provider: Mack Guise  Encounter Date: 04/02/2015      PT End of Session - 04/02/15 1716    Visit Number 12   Number of Visits 17   Date for PT Re-Evaluation 04/19/15   PT Start Time 2671   PT Stop Time 1430   PT Time Calculation (min) 45 min   Activity Tolerance Patient tolerated treatment well   Behavior During Therapy [facility] for tasks assessed/performed      Past Medical History  Diagnosis Date  . Palpitations     a. 04/2013 48h Holter: sinus rhythm/sinus arrhythmia, rare PAC's/PVC's.  . Basal cell carcinoma   . Hemochromatosis   . Atopic dermatitis   . Shoulder pain, right 01/24/2015  . Shoulder pain, left 01/24/2015    History reviewed. No pertinent past surgical history.  There were no vitals filed for this visit.  Visit Diagnosis:  Pain in joint of left shoulder      Subjective Assessment - 04/02/15 1708    Subjective pt reports her shoulder has been pretty good the past few days. she has less axilla pain since last session.    Limitations --  Typing   Patient Stated Goals reduce pain   Currently in Pain? Yes   Pain Score 2    Pain Location --  L shoulder     Therex:  Prone YTW 4x5 0lbs Low row red band 2x10 Supine ABCs with 1lb Modified full plank 30s x 3 sidelying ER 1lb 3x10 sidelying shoulder FF x 10 with min A and mod cues Mid row 7.5lbs 2x10 Low row 7.5lbs 2x10 Pt requires min verbal and tactile cues for proper exercise performance    Manual therapy:  PROM and passive stretching to L pecotalis. PROM into terminal shoulder flexion, abduction, ER/IR and extension with small oscillations at end range.                                 PT Long Term Goals  - 03/22/15 1741    PT LONG TERM GOAL #1   Title pt will be able to sleep through the night without waking due to shoulder pain.    Time 4   Period Weeks   Status Partially Met   PT LONG TERM GOAL #2   Title pt will demonstrate proper sitting/standing posture needing <3 cues for correction during PT session    Time 4   Period Weeks   Status Achieved   PT LONG TERM GOAL #3   Title pt will work a full day with no more than 3/10 pain in the shoulder    Time 4   Period Weeks   Status Partially Met   PT LONG TERM GOAL #4   Title pt will be independent and compliant with HEP   Time 4   Period Weeks   Status Achieved   PT LONG TERM GOAL #5   Title pt will be able to work 1 week full time wiht less than 3/10 pain   Time 4   Period Weeks   Status New   Additional Long Term Goals   Additional Long Term Goals Yes   PT LONG TERM GOAL #6   Title  pt will report no more than 10% disability on quick dash   Time 4   Period Weeks   Status New               Plan - 04/02/15 1718    Clinical Impression Statement PT compared accessory joint play with R side today. Pts L shoudler is considerably more hypermobile than the R suggesting she may be having surrounding secondary muscular tightness due to instability. PT progressed HEP for strengthening/stability today without increased pain only muscle fatigue.    Pt will benefit from skilled therapeutic intervention in order to improve on the following deficits Decreased strength;Hypomobility;Pain;Impaired flexibility;Postural dysfunction;Impaired UE functional use   Rehab Potential Good   PT Frequency 2x / week   PT Duration 4 weeks   PT Treatment/Interventions Cryotherapy;Electrical Stimulation;Iontophoresis 21m/ml Dexamethasone;Moist Heat;Traction;Ultrasound;Neuromuscular re-education;Therapeutic exercise;Therapeutic activities;Functional mobility training;Manual techniques;Dry needling;Taping        Problem List Patient Active Problem  List   Diagnosis Date Noted  . Shoulder pain, left 01/24/2015  . PVC's (premature ventricular contractions) 04/22/2013  . Tachycardia 04/22/2013  . Hemochromatosis 04/22/2013  . Palpitations 04/22/2013   AGorden Harms [name], PT, DPT #[id number] [name] 04/02/2015, 5:20 PM  CCeylonMAIN RNav[facility] BeaufortSERVICES [address] [address]Phone: [phone]  Fax:  [phone] Name: Adrienne RANDOMRN: [id number] of Birth: [birth year]

## 2015-04-04 ENCOUNTER — Ambulatory Visit: Payer: Managed Care, Other (non HMO)

## 2015-04-04 DIAGNOSIS — M25512 Pain in left shoulder: Secondary | ICD-10-CM

## 2015-04-04 NOTE — Therapy (Signed)
[place] MAIN [facility] SERVICES [address] [place], [place], [address] Phone: [phone]   Fax:  [phone]  Physical Therapy Treatment  Patient Details  Name: Adrienne Harris MRN: [id number] Date of Birth: Nov 24, 1975 Referring Provider: Mack Guise  Encounter Date: 04/04/2015      PT End of Session - 04/04/15 0901    Visit Number 13   Number of Visits 17   Date for PT Re-Evaluation 04/19/15   PT Start Time 0805   PT Stop Time 2641   PT Time Calculation (min) 42 min   Activity Tolerance Patient tolerated treatment well   Behavior During Therapy [facility] for tasks assessed/performed      Past Medical History  Diagnosis Date  . Palpitations     a. 04/2013 48h Holter: sinus rhythm/sinus arrhythmia, rare PAC's/PVC's.  . Basal cell carcinoma   . Hemochromatosis   . Atopic dermatitis   . Shoulder pain, right 01/24/2015  . Shoulder pain, left 01/24/2015    History reviewed. No pertinent past surgical history.  There were no vitals filed for this visit.  Visit Diagnosis:  Pain in joint of left shoulder      Subjective Assessment - 04/04/15 0900    Subjective pt reports she was not too sore after the progression of exercise. pt reports little pain at 1/10 in the L shoulder and axilla   Limitations --  Typing   Patient Stated Goals reduce pain   Currently in Pain? Yes   Pain Score 1    Pain Location Axilla   Pain Orientation Left        Therex:  UBE x 2 min no charge warm up Prone YTW 3x5 0lbs 5 lbs 2x10 ABCs with ball on wall at 90 deg flexion A-Z Chest press in matrix 5lbs 2x10 (with +) sidelying shoulder FF x 10 with min A and mod cues Mid row 7.5lbs 2x10 Low row 7.5lbs 2x10 D2 shoulder flexion 0lbs 2x10 Pt requires min verbal and tactile cues for proper exercise performance    MHP to L shoulder following treatment  Followed by brief manual therapy, Ischemic trigger point release to L upper  trap                       PT Education - 04/04/15 0901    Education provided Yes   Education Details importance of strengthening to reduce upper trap dominance    Person(s) Educated Patient   Methods Explanation   Comprehension Verbalized understanding             PT Long Term Goals - 03/22/15 1741    PT LONG TERM GOAL #1   Title pt will be able to sleep through the night without waking due to shoulder pain.    Time 4   Period Weeks   Status Partially Met   PT LONG TERM GOAL #2   Title pt will demonstrate proper sitting/standing posture needing <3 cues for correction during PT session    Time 4   Period Weeks   Status Achieved   PT LONG TERM GOAL #3   Title pt will work a full day with no more than 3/10 pain in the shoulder    Time 4   Period Weeks   Status Partially Met   PT LONG TERM GOAL #4   Title pt will be independent and compliant with HEP   Time 4   Period Weeks   Status Achieved  PT LONG TERM GOAL #5   Title pt will be able to work 1 week full time wiht less than 3/10 pain   Time 4   Period Weeks   Status New   Additional Long Term Goals   Additional Long Term Goals Yes   PT LONG TERM GOAL #6   Title pt will report no more than 10% disability on quick dash   Time 4   Period Weeks   Status New               Plan - 04/04/15 1610    Clinical Impression Statement pt still struggles some with upper trap dominance due to periscapular weakness, however is pretty self aware. she continues to do well with progression of strengthening, has quite marked weakness of the middle trap and lower trap.    Pt will benefit from skilled therapeutic intervention in order to improve on the following deficits Decreased strength;Hypomobility;Pain;Impaired flexibility;Postural dysfunction;Impaired UE functional use   Rehab Potential Good   PT Frequency 2x / week   PT Duration 4 weeks   PT Treatment/Interventions Cryotherapy;Electrical  Stimulation;Iontophoresis 31m/ml Dexamethasone;Moist Heat;Traction;Ultrasound;Neuromuscular re-education;Therapeutic exercise;Therapeutic activities;Functional mobility training;Manual techniques;Dry needling;Taping        Problem List Patient Active Problem List   Diagnosis Date Noted  . Shoulder pain, left 01/24/2015  . PVC's (premature ventricular contractions) 04/22/2013  . Tachycardia 04/22/2013  . Hemochromatosis 04/22/2013  . Palpitations 04/22/2013   AGorden Harms Greg Eckrich, PT, DPT #[id number] [name] 04/04/2015, 9:05 AM  CSanta AnnaMAIN RFargo [facility] Medical CenterSERVICES [address] [place] [address]Phone: [phone]  Fax:  [phone] Name: Adrienne ENNEKINGMRN: [id number] of Birth: [birth year]

## 2015-04-09 ENCOUNTER — Ambulatory Visit: Payer: Managed Care, Other (non HMO)

## 2015-04-09 DIAGNOSIS — M25512 Pain in left shoulder: Secondary | ICD-10-CM | POA: Diagnosis not present

## 2015-04-10 NOTE — Therapy (Signed)
[place] MAIN [facility] SERVICES [address] [place], [place], [address] Phone: [phone]   Fax:  [phone]  Physical Therapy Treatment  Patient Details  Name: Adrienne Harris MRN: [id number] Date of Birth: 1975/09/12 Referring Provider: Mack Guise  Encounter Date: 04/09/2015      PT End of Session - 04/10/15 0828    Visit Number 14   Number of Visits 17   Date for PT Re-Evaluation 04/19/15   PT Start Time 2197   PT Stop Time 1700   PT Time Calculation (min) 45 min   Activity Tolerance Patient tolerated treatment well   Behavior During Therapy [facility] for tasks assessed/performed      Past Medical History  Diagnosis Date  . Palpitations     a. 04/2013 48h Holter: sinus rhythm/sinus arrhythmia, rare PAC's/PVC's.  . Basal cell carcinoma   . Hemochromatosis   . Atopic dermatitis   . Shoulder pain, right 01/24/2015  . Shoulder pain, left 01/24/2015    History reviewed. No pertinent past surgical history.  There were no vitals filed for this visit.  Visit Diagnosis:  Pain in joint of left shoulder      Subjective Assessment - 04/10/15 0827    Subjective pt reports overall her shoulder is much better, she has little pain, most of the time, but still has days where it can hurt up to 3/10. she has much less pain in the axilla.    Limitations --  Typing   Patient Stated Goals reduce pain   Currently in Pain? Yes   Pain Score 1    Pain Location --  lateral L shoulder      Therex  UBE x 2 min no charge warm up  ABCs with Tball on wall at 90 deg flexion A-Z Chest press in matrix 5lbs 2x10 (with +) sidelying shoulder FF x 10 with min A and mod cues Mid row 7.5lbs 2x10 Tball wall push up x 10 Low row 7.5lbs 2x10 D2 shoulder flexion 0lbs 2x10 Over head press 2lbs x 10 : followed by MWM into shoulder felxion with posterior/inferior GH translation 2x10, then finished over head press 2lbs 2x10 without pain Pt requires min verbal and  tactile cues for proper exercise performance                                  PT Long Term Goals - 03/22/15 1741    PT LONG TERM GOAL #1   Title pt will be able to sleep through the night without waking due to shoulder pain.    Time 4   Period Weeks   Status Partially Met   PT LONG TERM GOAL #2   Title pt will demonstrate proper sitting/standing posture needing <3 cues for correction during PT session    Time 4   Period Weeks   Status Achieved   PT LONG TERM GOAL #3   Title pt will work a full day with no more than 3/10 pain in the shoulder    Time 4   Period Weeks   Status Partially Met   PT LONG TERM GOAL #4   Title pt will be independent and compliant with HEP   Time 4   Period Weeks   Status Achieved   PT LONG TERM GOAL #5   Title pt will be able to work 1 week full time wiht less than 3/10 pain   Time  4   Period Weeks   Status New   Additional Long Term Goals   Additional Long Term Goals Yes   PT LONG TERM GOAL #6   Title pt will report no more than 10% disability on quick dash   Time 4   Period Weeks   Status New               Plan - 04/10/15 0063    Clinical Impression Statement pt continues to do well with progression of strengthening. began overhead activities today with little pain, but following MWM, pt had no pain with overhead motions. pts residual pain is likley impingement related.    Pt will benefit from skilled therapeutic intervention in order to improve on the following deficits Decreased strength;Hypomobility;Pain;Impaired flexibility;Postural dysfunction;Impaired UE functional use   Rehab Potential Good   PT Frequency 2x / week   PT Duration 4 weeks   PT Treatment/Interventions Cryotherapy;Electrical Stimulation;Iontophoresis 68m/ml Dexamethasone;Moist Heat;Traction;Ultrasound;Neuromuscular re-education;Therapeutic exercise;Therapeutic activities;Functional mobility training;Manual techniques;Dry needling;Taping         Problem List Patient Active Problem List   Diagnosis Date Noted  . Shoulder pain, left 01/24/2015  . PVC's (premature ventricular contractions) 04/22/2013  . Tachycardia 04/22/2013  . Hemochromatosis 04/22/2013  . Palpitations 04/22/2013   AGorden Harms Tedford Harris, PT, DPT #[id number] Adrienne Harris 04/10/2015, 8:30 AM  CLowellMAIN RDhhs [facility] HospitalSERVICES [address] [address]Phone: [phone]  Fax:  [phone] Name: JDARIAH MCSORLEYMRN: [id number] of Birth: 106/15/1977

## 2015-04-11 ENCOUNTER — Ambulatory Visit: Payer: Managed Care, Other (non HMO) | Attending: Orthopedic Surgery

## 2015-04-11 DIAGNOSIS — M25512 Pain in left shoulder: Secondary | ICD-10-CM

## 2015-04-12 NOTE — Therapy (Signed)
[place] MAIN [facility] SERVICES [address] [place], [place], [address] Phone: [phone]   Fax:  [phone]  Physical Therapy Treatment  Patient Details  Name: Adrienne Harris MRN: [id number] Date of Birth: 10/22/1975 Referring Provider: Mack Guise  Encounter Date: 04/11/2015      PT End of Session - 04/12/15 0943    Visit Number 15   Number of Visits 17   Date for PT Re-Evaluation 04/19/15   PT Start Time 6073   PT Stop Time 1700   PT Time Calculation (min) 45 min   Activity Tolerance Patient tolerated treatment well   Behavior During Therapy [facility] for tasks assessed/performed      Past Medical History  Diagnosis Date  . Palpitations     a. 04/2013 48h Holter: sinus rhythm/sinus arrhythmia, rare PAC's/PVC's.  . Basal cell carcinoma   . Hemochromatosis   . Atopic dermatitis   . Shoulder pain, right 01/24/2015  . Shoulder pain, left 01/24/2015    History reviewed. No pertinent past surgical history.  There were no vitals filed for this visit.  Visit Diagnosis:  Pain in joint of left shoulder      Subjective Assessment - 04/12/15 0942    Subjective pt reports her shoulder is getting better. she reports 1/10 pain. she is not having very much axilla pain   Limitations --  Typing   Patient Stated Goals reduce pain   Currently in Pain? Yes   Pain Score 1    Pain Location Shoulder   Pain Orientation Left         ABCs with Tball on wall at 90 deg flexion and abd A-Z Chest press in matrix 5lbs 2x10 (with +) sidelying shoulder FF x 10 with min A and mod cues Mid row 7.5lbs 2x10 Plank with shoulder tap at incline x 10 Low row 7.5lbs 2x10 D2 shoulder flexion 0lbs 2x10 Over head press 2lbs x 10 :    Pt requires min verbal and tactile cues for proper exercise performance                      PT Education - 04/12/15 0943    Education provided Yes   Education Details general exercise progression    Person(s)  Educated Patient   Methods Explanation   Comprehension Verbalized understanding             PT Long Term Goals - 03/22/15 1741    PT LONG TERM GOAL #1   Title pt will be able to sleep through the night without waking due to shoulder pain.    Time 4   Period Weeks   Status Partially Met   PT LONG TERM GOAL #2   Title pt will demonstrate proper sitting/standing posture needing <3 cues for correction during PT session    Time 4   Period Weeks   Status Achieved   PT LONG TERM GOAL #3   Title pt will work a full day with no more than 3/10 pain in the shoulder    Time 4   Period Weeks   Status Partially Met   PT LONG TERM GOAL #4   Title pt will be independent and compliant with HEP   Time 4   Period Weeks   Status Achieved   PT LONG TERM GOAL #5   Title pt will be able to work 1 week full time wiht less than 3/10 pain   Time 4   Period  Weeks   Status New   Additional Long Term Goals   Additional Long Term Goals Yes   PT LONG TERM GOAL #6   Title pt will report no more than 10% disability on quick dash   Time 4   Period Weeks   Status New               Plan - 04/12/15 0944    Clinical Impression Statement pt continuing to do well wth progression of strengthening. she has less fatigue and more shoulder stability with planks. no pain with overhead press    Pt will benefit from skilled therapeutic intervention in order to improve on the following deficits Decreased strength;Hypomobility;Pain;Impaired flexibility;Postural dysfunction;Impaired UE functional use   Rehab Potential Good   PT Frequency 2x / week   PT Duration 4 weeks   PT Treatment/Interventions Cryotherapy;Electrical Stimulation;Iontophoresis 3m/ml Dexamethasone;Moist Heat;Traction;Ultrasound;Neuromuscular re-education;Therapeutic exercise;Therapeutic activities;Functional mobility training;Manual techniques;Dry needling;Taping        Problem List Patient Active Problem List   Diagnosis Date  Noted  . Shoulder pain, left 01/24/2015  . PVC's (premature ventricular contractions) 04/22/2013  . Tachycardia 04/22/2013  . Hemochromatosis 04/22/2013  . Palpitations 04/22/2013    Cordelro Gautreau 04/12/2015, 10:16 AM  CMesquiteMAIN RDublin Methodist HospitalSERVICES [address] [place] [address]Phone: [phone]  Fax:  [phone] Name: Adrienne CRESSYMRN: [id number] of Birth: 110-23-1977

## 2015-04-16 ENCOUNTER — Ambulatory Visit: Payer: Managed Care, Other (non HMO)

## 2015-04-16 DIAGNOSIS — M25512 Pain in left shoulder: Secondary | ICD-10-CM | POA: Diagnosis not present

## 2015-04-16 NOTE — Therapy (Signed)
[place] MAIN [facility] SERVICES [address] [place], [place], [address] Phone: [phone]   Fax:  [phone]  Physical Therapy Treatment  Patient Details  Name: Adrienne Harris MRN: [id number] Date of Birth: 1975/09/02 Referring Provider: Mack Guise  Encounter Date: 04/16/2015      PT End of Session - 04/16/15 1659    Visit Number 16   Number of Visits 17   Date for PT Re-Evaluation 04/19/15   PT Start Time 2426   PT Stop Time 1655   PT Time Calculation (min) 40 min   Activity Tolerance Patient tolerated treatment well   Behavior During Therapy [facility] for tasks assessed/performed      Past Medical History  Diagnosis Date  . Palpitations     a. 04/2013 48h Holter: sinus rhythm/sinus arrhythmia, rare PAC's/PVC's.  . Basal cell carcinoma   . Hemochromatosis   . Atopic dermatitis   . Shoulder pain, right 01/24/2015  . Shoulder pain, left 01/24/2015    No past surgical history on file.  There were no vitals filed for this visit.  Visit Diagnosis:  Pain in joint of left shoulder      Subjective Assessment - 04/16/15 1658    Subjective pt reports most ays she has little pain. she reports riding in the car having Lateral shoulder aching    Limitations --  Typing   Patient Stated Goals reduce pain   Currently in Pain? No/denies      Therex: sidelying shoulder FF2 x 10 with min cues Mid row 8lbs 2x10 Low row 7.5lbs 2x10 D2 shoulder flexion 1lbs 2x10 Over head press 2lbs 2x 10 :  D1 shoulder extension red band 2x10 Pt requires min verbal and tactile cues for proper exercise performance    Manual therapy  Soft tissue massage and Ischemic trigger point release to L pec and L deltoid, L biceps Patient received dry needling therapy education and acknowledged understanding of risks and benefits of dry needling therapy prior to receiving treatment. Patient voiced understanding of treatment options and elected to proceed with dry needling  therapy.   Trigger point dry needling to L deltoid followed by brief Soft tissue massage and posterior capsule stretching                          PT Education - 04/16/15 1658    Education provided Yes   Education Details posterior capsule stretch   Person(s) Educated Patient   Methods Explanation   Comprehension Verbalized understanding             PT Long Term Goals - 03/22/15 1741    PT LONG TERM GOAL #1   Title pt will be able to sleep through the night without waking due to shoulder pain.    Time 4   Period Weeks   Status Partially Met   PT LONG TERM GOAL #2   Title pt will demonstrate proper sitting/standing posture needing <3 cues for correction during PT session    Time 4   Period Weeks   Status Achieved   PT LONG TERM GOAL #3   Title pt will work a full day with no more than 3/10 pain in the shoulder    Time 4   Period Weeks   Status Partially Met   PT LONG TERM GOAL #4   Title pt will be independent and compliant with HEP   Time 4   Period Weeks   Status  Achieved   PT LONG TERM GOAL #5   Title pt will be able to work 1 week full time wiht less than 3/10 pain   Time 4   Period Weeks   Status New   Additional Long Term Goals   Additional Long Term Goals Yes   PT LONG TERM GOAL #6   Title pt will report no more than 10% disability on quick dash   Time 4   Period Weeks   Status New               Plan - 04/16/15 1659    Clinical Impression Statement pt responded well to trigger point dry needling of the L deltoid with twitch response. pt demontrates improved scapular control and strength in sidelying shoulder FF. PT will assess for DC next visit.    Pt will benefit from skilled therapeutic intervention in order to improve on the following deficits Decreased strength;Hypomobility;Pain;Impaired flexibility;Postural dysfunction;Impaired UE functional use   Rehab Potential Good   PT Frequency 2x / week   PT Duration 4 weeks   PT  Treatment/Interventions Cryotherapy;Electrical Stimulation;Iontophoresis 46m/ml Dexamethasone;Moist Heat;Traction;Ultrasound;Neuromuscular re-education;Therapeutic exercise;Therapeutic activities;Functional mobility training;Manual techniques;Dry needling;Taping        Problem List Patient Active Problem List   Diagnosis Date Noted  . Shoulder pain, left 01/24/2015  . PVC's (premature ventricular contractions) 04/22/2013  . Tachycardia 04/22/2013  . Hemochromatosis 04/22/2013  . Palpitations 04/22/2013   AGorden Harms Sullivan Jacuinde, PT, DPT #[id number] Dusti Tetro 04/16/2015, 5:00 PM  [place] [facility] [address] [place] [address]Phone: [phone]  Fax:  [phone] Name: Adrienne OLVERAMRN: [id number] of Birth: 103-23-1977

## 2015-04-18 ENCOUNTER — Ambulatory Visit: Payer: Managed Care, Other (non HMO)

## 2015-04-18 DIAGNOSIS — M25512 Pain in left shoulder: Secondary | ICD-10-CM

## 2015-04-18 NOTE — Patient Instructions (Signed)
[email or web address] D1 shoulder extension red band 2x10 D2 shoulder flexion 1lb 2x10 Shoulder press 2lbs 2x10 sidelying shoulder flexion 2x10

## 2015-04-18 NOTE — Therapy (Signed)
[place] MAIN [facility] SERVICES [address] [place], [place], [address] Phone: [phone]   Fax:  [phone]  Physical Therapy Treatment  Patient Details  Name: Adrienne Harris MRN: [id number] Date of Birth: [birth year] Referring Provider: Mack Guise  Encounter Date: 04/18/2015      PT End of Session - 04/18/15 1700    Visit Number 17   Number of Visits 17   Date for PT Re-Evaluation 04/19/15   PT Start Time [id number]   PT Stop Time 1655   PT Time Calculation (min) 40 min   Activity Tolerance Patient tolerated treatment well   Behavior During Therapy [facility] for tasks assessed/performed      Past Medical History  Diagnosis Date  . Palpitations     a. 04/2013 48h Holter: sinus rhythm/sinus arrhythmia, rare PAC's/PVC's.  . Basal cell carcinoma   . Hemochromatosis   . Atopic dermatitis   . Shoulder pain, right 01/24/2015  . Shoulder pain, left 01/24/2015    History reviewed. No pertinent past surgical history.  There were no vitals filed for this visit.  Visit Diagnosis:  Pain in joint of left shoulder      Subjective Assessment - 04/18/15 1658    Subjective pt reports her shoulder is much better than when she started PT. she reports she knows she needs to strengthenin the shoulder after DC   Limitations --  Typing   Patient Stated Goals reduce pain   Currently in Pain? Yes   Pain Score 2    Pain Location --  L pec   Pain Orientation Left          therex  D1 shoulder extension red band 2x10 D2 shoulder flexion 1lb 2x10 Shoulder press 2lbs 2x10 sidelying shoulder flexion 2x10 lat pull in fitness center 2 plates x 10 Row in fitness center 2 plates x 10 Chest press on cable column 1 plate x 10 Pt requires min verbal and tactile cues for proper exercise performance    Quick Dash: 5% disability                      PT Education - 04/18/15 1700    Education provided Yes   Education Details exercise  progression following DC   Person(s) Educated Patient   Methods Explanation   Comprehension Verbalized understanding             PT Long Term Goals - 04/18/15 1702    PT LONG TERM GOAL #1   Title pt will be able to sleep through the night without waking due to shoulder pain.    Time 4   Period Weeks   Status Achieved   PT LONG TERM GOAL #2   Title pt will demonstrate proper sitting/standing posture needing <3 cues for correction during PT session    Time 4   Period Weeks   Status Achieved   PT LONG TERM GOAL #3   Title pt will work a full day with no more than 3/10 pain in the shoulder    Time 4   Period Weeks   Status Achieved   PT LONG TERM GOAL #4   Title pt will be independent and compliant with HEP   Time 4   Period Weeks   Status Achieved   PT LONG TERM GOAL #5   Title pt will be able to work 1 week full time wiht less than 3/10 pain   Time 4   Period Weeks  Status Achieved   PT LONG TERM GOAL #6   Title pt will report no more than 10% disability on quick dash   Time 4   Period Weeks   Status Achieved               Plan - 04/18/15 1700    Clinical Impression Statement pt has progressed well in PT towards goals. she has much less than than she started and has significantly improved strength. pt will be DC to progressed HEP for continued strengthening after todays visit.    Pt will benefit from skilled therapeutic intervention in order to improve on the following deficits Decreased strength;Hypomobility;Pain;Impaired flexibility;Postural dysfunction;Impaired UE functional use   Rehab Potential Good   PT Frequency 2x / week   PT Duration 4 weeks   PT Treatment/Interventions Cryotherapy;Electrical Stimulation;Iontophoresis 4mg /ml Dexamethasone;Moist Heat;Traction;Ultrasound;Neuromuscular re-education;Therapeutic exercise;Therapeutic activities;Functional mobility training;Manual techniques;Dry needling;Taping        Problem List Patient Active  Problem List   Diagnosis Date Noted  . Shoulder pain, left 01/24/2015  . PVC's (premature ventricular contractions) 04/22/2013  . Tachycardia 04/22/2013  . Hemochromatosis 04/22/2013  . Palpitations 04/22/2013   Gorden Harms. Evvie Behrmann, PT, DPT [phone]  Lafayette Dunlevy 04/18/2015, 5:05 PM  [place] MAIN [facility] SERVICES [address] [place], [place], [address] Phone: [phone]   Fax:  [phone]  Name: Adrienne Harris MRN: [id number] Date of Birth: August 19, 1975

## 2015-05-29 ENCOUNTER — Inpatient Hospital Stay: Payer: 59 | Attending: Family Medicine

## 2015-05-29 DIAGNOSIS — L309 Dermatitis, unspecified: Secondary | ICD-10-CM | POA: Diagnosis not present

## 2015-05-29 DIAGNOSIS — L700 Acne vulgaris: Secondary | ICD-10-CM | POA: Diagnosis not present

## 2015-05-29 DIAGNOSIS — Z79899 Other long term (current) drug therapy: Secondary | ICD-10-CM | POA: Diagnosis not present

## 2015-05-29 DIAGNOSIS — D2261 Melanocytic nevi of right upper limb, including shoulder: Secondary | ICD-10-CM | POA: Diagnosis not present

## 2015-05-29 DIAGNOSIS — D225 Melanocytic nevi of trunk: Secondary | ICD-10-CM | POA: Diagnosis not present

## 2015-05-29 LAB — FERRITIN: FERRITIN: 57 ng/mL (ref 11–307)

## 2015-05-29 LAB — CBC
HEMATOCRIT: 42.3 % (ref 35.0–47.0)
Hemoglobin: 14.6 g/dL (ref 12.0–16.0)
MCH: 32.9 pg (ref 26.0–34.0)
MCHC: 34.6 g/dL (ref 32.0–36.0)
MCV: 95.1 fL (ref 80.0–100.0)
PLATELETS: 330 10*3/uL (ref 150–440)
RBC: 4.45 MIL/uL (ref 3.80–5.20)
RDW: 12.5 % (ref 11.5–14.5)
WBC: 6.1 10*3/uL (ref 3.6–11.0)

## 2015-07-24 DIAGNOSIS — H5213 Myopia, bilateral: Secondary | ICD-10-CM | POA: Diagnosis not present

## 2015-09-25 ENCOUNTER — Inpatient Hospital Stay: Payer: 59 | Attending: Internal Medicine

## 2015-09-25 DIAGNOSIS — Z79899 Other long term (current) drug therapy: Secondary | ICD-10-CM | POA: Insufficient documentation

## 2015-09-25 LAB — CBC
HCT: 43.3 % (ref 35.0–47.0)
HEMOGLOBIN: 15.1 g/dL (ref 12.0–16.0)
MCH: 33.5 pg (ref 26.0–34.0)
MCHC: 34.9 g/dL (ref 32.0–36.0)
MCV: 96.2 fL (ref 80.0–100.0)
PLATELETS: 237 10*3/uL (ref 150–440)
RBC: 4.5 MIL/uL (ref 3.80–5.20)
RDW: 12.6 % (ref 11.5–14.5)
WBC: 4.9 10*3/uL (ref 3.6–11.0)

## 2015-09-25 LAB — FERRITIN: FERRITIN: 62 ng/mL (ref 11–307)

## 2016-01-11 DIAGNOSIS — R1032 Left lower quadrant pain: Secondary | ICD-10-CM | POA: Diagnosis not present

## 2016-01-16 ENCOUNTER — Other Ambulatory Visit: Payer: Managed Care, Other (non HMO)

## 2016-01-21 ENCOUNTER — Other Ambulatory Visit: Payer: Self-pay

## 2016-01-21 ENCOUNTER — Inpatient Hospital Stay: Payer: 59 | Attending: Internal Medicine

## 2016-01-21 DIAGNOSIS — Z79899 Other long term (current) drug therapy: Secondary | ICD-10-CM | POA: Diagnosis not present

## 2016-01-21 DIAGNOSIS — R109 Unspecified abdominal pain: Secondary | ICD-10-CM | POA: Diagnosis not present

## 2016-01-21 LAB — CBC
HCT: 40.9 % (ref 35.0–47.0)
Hemoglobin: 14.2 g/dL (ref 12.0–16.0)
MCH: 33 pg (ref 26.0–34.0)
MCHC: 34.8 g/dL (ref 32.0–36.0)
MCV: 94.9 fL (ref 80.0–100.0)
PLATELETS: 233 10*3/uL (ref 150–440)
RBC: 4.31 MIL/uL (ref 3.80–5.20)
RDW: 12.2 % (ref 11.5–14.5)
WBC: 5.9 10*3/uL (ref 3.6–11.0)

## 2016-01-21 LAB — COMPREHENSIVE METABOLIC PANEL
ALT: 13 U/L — AB (ref 14–54)
AST: 20 U/L (ref 15–41)
Albumin: 3.9 g/dL (ref 3.5–5.0)
Alkaline Phosphatase: 38 U/L (ref 38–126)
Anion gap: 4 — ABNORMAL LOW (ref 5–15)
BUN: 13 mg/dL (ref 6–20)
CALCIUM: 9.1 mg/dL (ref 8.9–10.3)
CHLORIDE: 106 mmol/L (ref 101–111)
CO2: 29 mmol/L (ref 22–32)
CREATININE: 0.66 mg/dL (ref 0.44–1.00)
Glucose, Bld: 97 mg/dL (ref 65–99)
Potassium: 3.9 mmol/L (ref 3.5–5.1)
Sodium: 139 mmol/L (ref 135–145)
Total Bilirubin: 0.5 mg/dL (ref 0.3–1.2)
Total Protein: 7.1 g/dL (ref 6.5–8.1)

## 2016-01-21 LAB — FERRITIN: FERRITIN: 53 ng/mL (ref 11–307)

## 2016-01-22 ENCOUNTER — Other Ambulatory Visit: Payer: Managed Care, Other (non HMO)

## 2016-01-23 ENCOUNTER — Ambulatory Visit: Payer: Managed Care, Other (non HMO)

## 2016-01-28 ENCOUNTER — Inpatient Hospital Stay (HOSPITAL_BASED_OUTPATIENT_CLINIC_OR_DEPARTMENT_OTHER): Payer: 59 | Admitting: Oncology

## 2016-01-28 ENCOUNTER — Other Ambulatory Visit: Payer: Self-pay | Admitting: *Deleted

## 2016-01-28 ENCOUNTER — Encounter: Payer: Self-pay | Admitting: Oncology

## 2016-01-28 DIAGNOSIS — R109 Unspecified abdominal pain: Secondary | ICD-10-CM

## 2016-01-28 DIAGNOSIS — Z79899 Other long term (current) drug therapy: Secondary | ICD-10-CM

## 2016-01-28 DIAGNOSIS — R1032 Left lower quadrant pain: Secondary | ICD-10-CM | POA: Diagnosis not present

## 2016-01-28 NOTE — Progress Notes (Signed)
Pt has been having sinus issues and drianage down her throat, with coughing. She got a humidifier and cleaning in their room. Her husband had the same issues.  She has exercises for her shoulder and she has someon and off discomfort but know how important it is

## 2016-01-28 NOTE — Progress Notes (Signed)
Hematology/Oncology Consult note [facility]  [phone] [phone]  Patient Care Team: No Pcp Per Patient as PCP - General (General Practice)   Name of the patient: Adrienne Harris  [id number]  08-Oct-1975   Date of visit: 01/28/16  Diagnosis- compound heterozygous hereditary hemochromatosis C282Y and H63D)  Chief complaint/ Reason for visit: Patient is here for routine follow-up of her hereditary hemachromatosis  Heme/Onc history:  Patient was diagnosed with hereditary hemachromatosis in 2009. Initially she required multiple phlebotomies but has not required any since 2015.  Interval history- she is currently recovering from upper respiratory infection and sinus congestion. Otherwise reports doing well and denies any persistent fatigue, joint pain joint swelling, dyspnea on exertion or pedal edema. She has been having ongoing left sided abdominal pain for which she saw GYN and will be getting abdominal ultrasound shortly.   Review of systems- Review of Systems  Constitutional: Negative for chills, fever, malaise/fatigue and weight loss.  HENT: Negative for congestion, ear discharge and nosebleeds.   Eyes: Negative for blurred vision.  Respiratory: Negative for cough, hemoptysis, sputum production, shortness of breath and wheezing.   Cardiovascular: Negative for chest pain, palpitations, orthopnea and claudication.  Gastrointestinal: Positive for abdominal pain. Negative for blood in stool, constipation, diarrhea, heartburn, melena, nausea and vomiting.  Genitourinary: Negative for dysuria, flank pain, frequency, hematuria and urgency.  Musculoskeletal: Negative for back pain, joint pain and myalgias.  Skin: Negative for rash.  Neurological: Negative for dizziness, tingling, focal weakness, seizures, weakness and headaches.  Endo/Heme/Allergies: Does not bruise/bleed easily.  Psychiatric/Behavioral: Negative for depression and suicidal  ideas. The patient does not have insomnia.      Current treatment- observation  No Known Allergies   Past Medical History:  Diagnosis Date   Atopic dermatitis    Basal cell carcinoma    Hemochromatosis    Palpitations    a. 04/2013 48h Holter: sinus rhythm/sinus arrhythmia, rare PAC's/PVC's.   Shoulder pain, left 01/24/2015   Shoulder pain, right 01/24/2015     History reviewed. No pertinent surgical history.  Social History   Social History   Marital status: Married    Spouse name: N/A   Number of children: N/A   Years of education: N/A   Occupational History   Not on file.   Social History Main Topics   Smoking status: Never Smoker   Smokeless tobacco: Never Used   Alcohol use 1.2 oz/week    2 Cans of beer per week     Comment: 1-2 beers a week   Drug use: No   Sexual activity: Not on file   Other Topics Concern   Not on file   Social History Narrative   No narrative on file    Family History  Problem Relation Age of Onset   Heart disease Maternal Grandfather    Hyperlipidemia Maternal Grandfather    Hypertension Maternal Grandfather    Hypertension Father    Melanoma Father    Hemochromatosis Mother    Hyperlipidemia Maternal Grandmother    Breast cancer Paternal Aunt    Kidney cancer Paternal Aunt      Current Outpatient Prescriptions:    levonorgestrel-ethinyl estradiol (NORDETTE) 0.15-30 MG-MCG tablet, Take 1 tablet by mouth daily., Disp: , Rfl:    naproxen sodium (ANAPROX) 550 MG tablet, Take 550 mg by mouth 2 (two) times daily as needed. Takes PRN around menstrual cycle due to HA, Disp: , Rfl:    propranolol (INDERAL) 20 MG tablet,  Take 1 tablet (20 mg total) by mouth 3 (three) times daily as needed., Disp: 90 tablet, Rfl: 11  Physical exam:  Vitals:   01/28/16 0903  BP: 138/90  Pulse: 79  Resp: 18  Temp: 97.6 F (36.4 C)  TempSrc: Tympanic  Weight: 132 lb 7.9 oz (60.1 kg)  Height: 5\' 6"  (1.676 m)    Physical Exam  Constitutional: She is oriented to person, place, and time and well-developed, well-nourished, and in no distress.  HENT:  Head: Normocephalic and atraumatic.  Eyes: EOM are normal. Pupils are equal, round, and reactive to light.  Neck: Normal range of motion.  Cardiovascular: Normal rate, regular rhythm and normal heart sounds.   Pulmonary/Chest: Effort normal and breath sounds normal.  Abdominal: Soft. Bowel sounds are normal.  Neurological: She is alert and oriented to person, place, and time.  Skin: Skin is warm and dry.   Musculoskeletal no joint swelling    CMP Latest Ref Rng & Units 01/21/2016  Glucose 65 - 99 mg/dL 97  BUN 6 - 20 mg/dL 13  Creatinine 0.44 - 1.00 mg/dL 0.66  Sodium 135 - 145 mmol/L 139  Potassium 3.5 - 5.1 mmol/L 3.9  Chloride 101 - 111 mmol/L 106  CO2 22 - 32 mmol/L 29  Calcium 8.9 - 10.3 mg/dL 9.1  Total Protein 6.5 - 8.1 g/dL 7.1  Total Bilirubin 0.3 - 1.2 mg/dL 0.5  Alkaline Phos 38 - 126 U/L 38  AST 15 - 41 U/L 20  ALT 14 - 54 U/L 13(L)   CBC Latest Ref Rng & Units 01/21/2016  WBC 3.6 - 11.0 K/uL 5.9  Hemoglobin 12.0 - 16.0 g/dL 14.2  Hematocrit 35.0 - 47.0 % 40.9  Platelets 150 - 440 K/uL 233      Assessment and plan- Patient is a 40 y.o. female with a history of hereditary hemachromatosis with compound heterozygosity for C282Y and H 63D. She required phlebotomy initially and is currently on observation   1. Her recent CBC from 01/21/2016 was within normal limits and her ferritin was 53. CMP was within normal limits. We will continue to observe her on a yearly basis and she will see [place] back in one year's time with CBC, iron studies and CMP. She remains asymptomatic at this time and given that she required initial phlebotomies, we will plan to keep her ferritin between 50- 100  2. Abdominal pain: Etiology unclear. This is being followed by GYN and she will be getting an ultrasound soon  Visit Diagnosis 1. Hereditary  hemochromatosis ([place])      Dr. Randa Evens, MD, MPH [place] at [facility] Pager-  01/28/2016 11:46 AM

## 2016-01-29 ENCOUNTER — Ambulatory Visit: Payer: Managed Care, Other (non HMO) | Admitting: Internal Medicine

## 2016-01-29 DIAGNOSIS — Z1211 Encounter for screening for malignant neoplasm of colon: Secondary | ICD-10-CM | POA: Diagnosis not present

## 2016-04-03 ENCOUNTER — Other Ambulatory Visit: Payer: Self-pay | Admitting: Certified Nurse Midwife

## 2016-04-03 DIAGNOSIS — Z124 Encounter for screening for malignant neoplasm of cervix: Secondary | ICD-10-CM | POA: Diagnosis not present

## 2016-04-03 DIAGNOSIS — Z1231 Encounter for screening mammogram for malignant neoplasm of breast: Secondary | ICD-10-CM

## 2016-04-03 DIAGNOSIS — R1032 Left lower quadrant pain: Secondary | ICD-10-CM | POA: Diagnosis not present

## 2016-04-03 DIAGNOSIS — Z01419 Encounter for gynecological examination (general) (routine) without abnormal findings: Secondary | ICD-10-CM | POA: Diagnosis not present

## 2016-04-03 DIAGNOSIS — N921 Excessive and frequent menstruation with irregular cycle: Secondary | ICD-10-CM | POA: Diagnosis not present

## 2016-04-07 ENCOUNTER — Telehealth: Payer: Self-pay | Admitting: Gastroenterology

## 2016-04-07 NOTE — Telephone Encounter (Signed)
Left voice message for patient to call and schedule appointment with GI for LLQ pain. Referred by [facility]

## 2016-04-29 ENCOUNTER — Other Ambulatory Visit: Payer: Self-pay | Admitting: Certified Nurse Midwife

## 2016-04-29 ENCOUNTER — Encounter: Payer: Self-pay | Admitting: Radiology

## 2016-04-29 ENCOUNTER — Ambulatory Visit
Admission: RE | Admit: 2016-04-29 | Discharge: 2016-04-29 | Disposition: A | Discharge status: Home / Self Care | Payer: 59 | Source: Ambulatory Visit | Attending: Certified Nurse Midwife | Admitting: Certified Nurse Midwife

## 2016-04-29 DIAGNOSIS — Z1231 Encounter for screening mammogram for malignant neoplasm of breast: Secondary | ICD-10-CM | POA: Insufficient documentation

## 2016-04-29 DIAGNOSIS — R928 Other abnormal and inconclusive findings on diagnostic imaging of breast: Secondary | ICD-10-CM

## 2016-04-29 DIAGNOSIS — N6320 Unspecified lump in the left breast, unspecified quadrant: Secondary | ICD-10-CM

## 2016-05-01 ENCOUNTER — Other Ambulatory Visit: Payer: Self-pay

## 2016-05-01 ENCOUNTER — Other Ambulatory Visit: Payer: Self-pay | Admitting: Certified Nurse Midwife

## 2016-05-01 DIAGNOSIS — N6320 Unspecified lump in the left breast, unspecified quadrant: Secondary | ICD-10-CM

## 2016-05-05 ENCOUNTER — Encounter: Payer: Self-pay | Admitting: Gastroenterology

## 2016-05-05 ENCOUNTER — Ambulatory Visit (INDEPENDENT_AMBULATORY_CARE_PROVIDER_SITE_OTHER): Payer: 59 | Admitting: Gastroenterology

## 2016-05-05 VITALS — BP 147/77 | HR 93 | Temp 97.9°F | Ht 66.0 in | Wt 131.0 lb

## 2016-05-05 DIAGNOSIS — R1032 Left lower quadrant pain: Secondary | ICD-10-CM | POA: Diagnosis not present

## 2016-05-05 NOTE — Patient Instructions (Signed)
You are scheduled for a CT scan of the Abdomen/pelvis with Contrast at [name] location on Wednesday, April 4th @ 8:00am. Please arrive at 7:45am. You cannot have anything to eat or drink after midnight Tuesday night.   If you need to reschedule this scan for any reason, please contact central scheduling at [phone].

## 2016-05-05 NOTE — Progress Notes (Signed)
Gastroenterology Consultation  Referring Provider:     No ref. provider found Primary Care Physician:  No PCP Per Patient Primary Gastroenterologist:  Dr. Samek Norris     Reason for Consultation:     Left side abdominal pain        HPI:   Adrienne Harris is a 41 y.o. y/o female referred for consultation & management of Left sided abdominal pain by Dr. Rayne Du PCP Per Patient.  Patient comes in today with a report of having a illness back in August of last year. The patient states that after eating the same thing her family did she started to have some abdominal pain. The patient states she was on a walk with her daughter and she had to return home because she felt she may have to use the bathroom urgently. The patient reports that she did not go to the bathroom but had some vomiting. The patient persisted and then never really went away. The patient did not have any unexplained weight loss fevers chills nausea or vomiting during the chronicity of her pain. The patient states that the symptoms had gotten worse recently and was just more annoying than it was debilitating. The patient states it is a constant pain without any change in her symptoms with eating or drinking. She states that she can feel the pain more right before she goes to the bathroom but then the pain gets less after she moves her bowels but is not completely gone. The patient denies any night sweats that she has woken up from a sleep with the pain. She does have a father who had colon polyps before the age of 61 but she is not sure when he had the polyps. The patient's bowel habits have not changed during this entire time without any chronic constipation or diarrhea. He has been seen by OB/GYN and was told that her vaginal ultrasound was negative.  Past Medical History:  Diagnosis Date  . Atopic dermatitis   . Basal cell carcinoma   . Hemochromatosis   . Palpitations    a. 04/2013 48h Holter: sinus rhythm/sinus arrhythmia, rare PAC's/PVC's.  .  Shoulder pain, left 01/24/2015  . Shoulder pain, right 01/24/2015    Past Surgical History:  Procedure Laterality Date  . NO PAST SURGERIES      Prior to Admission medications   Medication Sig Start Date End Date Taking? Authorizing Provider  ACZONE 7.5 % GEL  01/24/16  Yes Historical Provider, MD  levonorgestrel-ethinyl estradiol (NORDETTE) 0.15-30 MG-MCG tablet Take 1 tablet by mouth daily.   Yes Historical Provider, MD  naproxen sodium (ANAPROX) 550 MG tablet Take 550 mg by mouth 2 (two) times daily as needed. Takes PRN around menstrual cycle due to HA   Yes Historical Provider, MD  propranolol (INDERAL) 20 MG tablet Take 1 tablet (20 mg total) by mouth 3 (three) times daily as needed. 03/09/15  Yes Rogelia Mire, NP  estradiol (ESTRACE) 1 MG tablet  04/03/16   Historical Provider, MD    Family History  Problem Relation Age of Onset  . Heart disease Maternal Grandfather   . Hyperlipidemia Maternal Grandfather   . Hypertension Maternal Grandfather   . Hypertension Father   . Melanoma Father   . Hemochromatosis Mother   . Hyperlipidemia Maternal Grandmother   . Breast cancer Paternal Aunt   . Kidney cancer Paternal Aunt      Social History  Substance Use Topics  . Smoking status: Never Smoker  .  Smokeless tobacco: Never Used  . Alcohol use 1.2 oz/week    2 Cans of beer per week     Comment: 1-2 beers a week    Allergies as of 05/05/2016  . (No Known Allergies)    Review of Systems:    All systems reviewed and negative except where noted in HPI.   Physical Exam:  BP (!) 147/77   Pulse 93   Temp 97.9 F (36.6 C) (Oral)   Ht 5\' 6"  (1.676 m)   Wt 131 lb (59.4 kg)   LMP 04/09/2016   BMI 21.14 kg/m  Patient's last menstrual period was 04/09/2016. Psych:  Alert and cooperative. Normal mood and affect. General:   Alert,  Well-developed, well-nourished, pleasant and cooperative in NAD Head:  Normocephalic and atraumatic. Eyes:  Sclera clear, no icterus.    Conjunctiva pink. Ears:  Normal auditory acuity. Nose:  No deformity, discharge, or lesions. Mouth:  No deformity or lesions,oropharynx pink & moist. Neck:  Supple; no masses or thyromegaly. Lungs:  Respirations even and unlabored.  Clear throughout to auscultation.   No wheezes, crackles, or rhonchi. No acute distress. Heart:  Regular rate and rhythm; no murmurs, clicks, rubs, or gallops. Abdomen:  Normal bowel sounds.  No bruits.  Soft, Mild tenderness to 1 finger palpation on the left lower quadrant and non-distended without masses, hepatosplenomegaly or hernias noted.  No guarding or rebound tenderness.  Negative Carnett sign.   Rectal:  Deferred.  Msk:  Symmetrical without gross deformities.  Good, equal movement & strength bilaterally. Pulses:  Normal pulses noted. Extremities:  No clubbing or edema.  No cyanosis. Neurologic:  Alert and oriented x3;  grossly normal neurologically. Skin:  Intact without significant lesions or rashes.  No jaundice. Lymph Nodes:  No significant cervical adenopathy. Psych:  Alert and cooperative. Normal mood and affect.  Imaging Studies: Mm Screening Breast Tomo Bilateral  Result Date: 04/29/2016 CLINICAL DATA:  Screening. EXAM: 2D DIGITAL SCREENING BILATERAL MAMMOGRAM WITH CAD AND ADJUNCT TOMO COMPARISON:  Previous exam(s). ACR Breast Density Category c: The breast tissue is heterogeneously dense, which may obscure small masses. FINDINGS: In the left breast, a possible mass warrants further evaluation. In the right breast, no findings suspicious for malignancy. Images were processed with CAD. IMPRESSION: Further evaluation is suggested for possible mass in the left breast. RECOMMENDATION: Diagnostic mammogram and possibly ultrasound of the left breast. (Code:[id number]) The patient will be contacted regarding the findings, and additional imaging will be scheduled. BI-RADS CATEGORY  0: Incomplete. Need additional imaging evaluation and/or prior mammograms for  comparison. Electronically Signed   By: Marin Olp M.D.   On: 04/29/2016 11:12    Assessment and Plan:   Adrienne Harris is a 41 y.o. y/o female who comes in with chronic left lower quadrant pain. The pain is a chronic dull pain in the left lower quadrant. The patient does not have any change in bowel habits or unexplained weight loss. The patient has a family history of colon polyps in her father before the age of 32. The patient has been told that she should have a colonoscopy for a family history of colon polyps at some time in the near future. As for the abdominal pain a colonoscopy is unlikely to show the cause of the abdominal pain. The patient has been told that I would recommend a CT scan to evaluate her for possible cause of her abdominal pain. The patient may have had a past history of subclinical diverticulitis with adhesions  versus a possible walled off abscess as the cause of her abdominal pain. The patient has been explained the plan and agrees with it.    [name], MD. Marval Regal   Note: This dictation was prepared with Dragon dictation along with smaller phrase technology. Any transcriptional errors that result from this process are unintentional.

## 2016-05-06 ENCOUNTER — Ambulatory Visit
Admission: RE | Admit: 2016-05-06 | Discharge: 2016-05-06 | Disposition: A | Discharge status: Home / Self Care | Payer: 59 | Source: Ambulatory Visit | Attending: Certified Nurse Midwife | Admitting: Certified Nurse Midwife

## 2016-05-06 DIAGNOSIS — R928 Other abnormal and inconclusive findings on diagnostic imaging of breast: Secondary | ICD-10-CM

## 2016-05-06 DIAGNOSIS — N6320 Unspecified lump in the left breast, unspecified quadrant: Secondary | ICD-10-CM

## 2016-05-06 DIAGNOSIS — N6002 Solitary cyst of left breast: Secondary | ICD-10-CM | POA: Diagnosis not present

## 2016-05-13 ENCOUNTER — Other Ambulatory Visit
Admission: RE | Admit: 2016-05-13 | Discharge: 2016-05-13 | Disposition: A | Discharge status: Home / Self Care | Payer: 59 | Source: Ambulatory Visit | Attending: Gastroenterology | Admitting: Gastroenterology

## 2016-05-13 DIAGNOSIS — R1032 Left lower quadrant pain: Secondary | ICD-10-CM | POA: Diagnosis not present

## 2016-05-13 LAB — PREGNANCY, URINE: PREG TEST UR: NEGATIVE

## 2016-05-14 ENCOUNTER — Ambulatory Visit
Admission: RE | Admit: 2016-05-14 | Discharge: 2016-05-14 | Disposition: A | Discharge status: Home / Self Care | Payer: 59 | Source: Ambulatory Visit | Attending: Gastroenterology | Admitting: Gastroenterology

## 2016-05-14 DIAGNOSIS — R109 Unspecified abdominal pain: Secondary | ICD-10-CM | POA: Diagnosis not present

## 2016-05-14 DIAGNOSIS — K7689 Other specified diseases of liver: Secondary | ICD-10-CM | POA: Insufficient documentation

## 2016-05-14 DIAGNOSIS — K769 Liver disease, unspecified: Secondary | ICD-10-CM | POA: Insufficient documentation

## 2016-05-14 DIAGNOSIS — R1032 Left lower quadrant pain: Secondary | ICD-10-CM | POA: Diagnosis present

## 2016-05-14 MED ORDER — IOPAMIDOL (ISOVUE-300) INJECTION 61%
100.0000 mL | Freq: Once | INTRAVENOUS | Status: AC | PRN
  Administered 2016-05-14: 100 mL via INTRAVENOUS

## 2016-05-15 ENCOUNTER — Telehealth: Payer: Self-pay | Admitting: Gastroenterology

## 2016-05-15 NOTE — Telephone Encounter (Signed)
Patient would like to know if her results are in from the CT abdomin scan

## 2016-05-16 NOTE — Telephone Encounter (Signed)
-----   Message from [name], MD sent at 05/15/2016  1:47 PM EDT ----- Let the patient know that the CT scan did not show a cause for her pain but did show multiple cysts in her liver. It was recommended that she undergo a MRI with and without contrast. These are unlikely to be the cause of her abdominal pain.

## 2016-05-16 NOTE — Telephone Encounter (Signed)
Pt notified of CT scan results. Pt stated she had an [place] years ago that noted the hepatic cyst. Pt advised radiologist has recommended an MRI with and without contrast to evaluate the hepatic cyst further. Pt will check her insurance to see the cost of this scan and let me know if she wants to proceed with it.

## 2016-06-10 ENCOUNTER — Telehealth: Payer: Self-pay | Admitting: Cardiovascular Disease

## 2016-06-10 NOTE — Telephone Encounter (Signed)
Pt calling to see if she can just see her PCP for her heart care She states she is doing fine and is not having issues But will see if they will refill her medication  Please advise, she is not wanting to schedule her yearly appointment with [place]

## 2016-06-10 NOTE — Telephone Encounter (Signed)
That's fine.  She can call us if she needs anything.

## 2016-06-17 ENCOUNTER — Telehealth: Payer: Self-pay | Admitting: Cardiovascular Disease

## 2016-06-17 NOTE — Telephone Encounter (Signed)
3 attempts to schedule fu from recall list.  12 month fu per ckout 03/09/15  Deleting recall.

## 2016-07-10 ENCOUNTER — Ambulatory Visit (INDEPENDENT_AMBULATORY_CARE_PROVIDER_SITE_OTHER): Payer: 59 | Admitting: Family

## 2016-07-10 ENCOUNTER — Encounter: Payer: Self-pay | Admitting: Family

## 2016-07-10 VITALS — BP 130/86 | HR 94 | Temp 98.2°F | Ht 66.0 in | Wt 132.0 lb

## 2016-07-10 DIAGNOSIS — Z0000 Encounter for general adult medical examination without abnormal findings: Secondary | ICD-10-CM | POA: Insufficient documentation

## 2016-07-10 DIAGNOSIS — I493 Ventricular premature depolarization: Secondary | ICD-10-CM | POA: Diagnosis not present

## 2016-07-10 DIAGNOSIS — Z7689 Persons encountering health services in other specified circumstances: Secondary | ICD-10-CM | POA: Diagnosis not present

## 2016-07-10 DIAGNOSIS — L209 Atopic dermatitis, unspecified: Secondary | ICD-10-CM | POA: Diagnosis not present

## 2016-07-10 DIAGNOSIS — K7689 Other specified diseases of liver: Secondary | ICD-10-CM | POA: Diagnosis not present

## 2016-07-10 MED ORDER — PROPRANOLOL HCL 20 MG PO TABS: 20.0000 mg | ORAL_TABLET | Freq: Three times a day (TID) | ORAL | 3 refills | Status: DC | PRN

## 2016-07-10 NOTE — Assessment & Plan Note (Addendum)
Reviewed history. Patient does physical with CNM. However screening labs were not performed this year. She consented for HIV screening in addition to the screening labs. She will come back when she is fasting.

## 2016-07-10 NOTE — Patient Instructions (Signed)
Fasting  Labs  tdap vaccine  Pleasure meeting you

## 2016-07-10 NOTE — Assessment & Plan Note (Signed)
Stable. propranolol 10 mg once a day. No longer desires to follow with Gollan at this time. Advised her to remain vigilant and let me know if any new symptoms. Refilled propanolol. Will follow.

## 2016-07-10 NOTE — Assessment & Plan Note (Signed)
Reviewed finding with patient today. She politely declined MRI this time that due to insurance. Pending liver function tests today. We will discuss at future visits.

## 2016-07-10 NOTE — Assessment & Plan Note (Signed)
Stable. Continues to follow with dermatology

## 2016-07-10 NOTE — Progress Notes (Signed)
Subjective:    Patient ID: Adrienne Harris, female    DOB: [birth year], 41 y.o.   MRN: [id number]  CC: Adrienne Harris is a 41 y.o. female who presents today to establish care.    HPI: PCP had been with GYN. Follows with pap with [facility] with colleen.    Basal cell history- follows with Dr Evorn Gong  Palpitations- stable. evaluated 2015-2016. Started on propranolol prn by [facility] for palpiations. Now taking 10mg  propranolol with less palpitations. Wore holter monitor and told she had frequent PVCs. 1 cup coffee per day. No anxiety attacks.  Denies exertional chest pain or pressure, numbness or tingling radiating to left arm or jaw, dizziness, frequent headaches, changes in vision, or shortness of breath.   Hemachromatosis- follows with Dr Janese Banks. Has done some phlebotomy. Watching iron.     HISTORY:  Past Medical History:  Diagnosis Date  . Atopic dermatitis   . Basal cell carcinoma    15 years ago.  Marland Kitchen Hemochromatosis   . Palpitations    a. 04/2013 48h Holter: sinus rhythm/sinus arrhythmia, rare PAC's/PVC's.  . Shoulder pain, left 01/24/2015  . Shoulder pain, right 01/24/2015   Past Surgical History:  Procedure Laterality Date  . NO PAST SURGERIES     Family History  Problem Relation Age of Onset  . Heart disease Maternal Grandfather   . Hyperlipidemia Maternal Grandfather   . Hypertension Maternal Grandfather   . Hypertension Father   . Melanoma Father   . Hemochromatosis Mother   . Basal cell carcinoma Mother   . Hyperlipidemia Maternal Grandmother   . Breast cancer Paternal Aunt   . Kidney cancer Paternal Aunt     Allergies: Cortizone-10 [hydrocortisone]; Pollen extract; and Propylene glycol Current Outpatient Prescriptions on File Prior to Visit  Medication Sig Dispense Refill  . ACZONE 7.5 % GEL   2  . levonorgestrel-ethinyl estradiol (NORDETTE) 0.15-30 MG-MCG tablet Take 1 tablet by mouth daily.    . naproxen sodium (ANAPROX) 550 MG tablet Take 550 mg by mouth 2 (two)  times daily as needed. Takes PRN around menstrual cycle due to HA     No current facility-administered medications on file prior to visit.     Social History  Substance Use Topics  . Smoking status: Never Smoker  . Smokeless tobacco: Never Used  . Alcohol use 1.2 oz/week    2 Cans of beer per week     Comment: 1-2 beers a week    Review of Systems  Constitutional: Negative for chills and fever.  Eyes: Negative for visual disturbance.  Respiratory: Negative for cough.   Cardiovascular: Positive for palpitations. Negative for chest pain.  Gastrointestinal: Negative for nausea and vomiting.  Neurological: Negative for headaches.      Objective:    BP 130/86   Pulse 94   Temp 98.2 F (36.8 C) (Oral)   Ht 5\' 6"  (1.676 m)   Wt 132 lb (59.9 kg)   SpO2 99%   BMI 21.31 kg/m  BP Readings from Last 3 Encounters:  07/10/16 130/86  05/05/16 (!) 147/77  01/28/16 (!) 98/58   Wt Readings from Last 3 Encounters:  07/10/16 132 lb (59.9 kg)  05/05/16 131 lb (59.4 kg)  01/28/16 132 lb (59.9 kg)    Physical Exam  Constitutional: She appears well-developed and well-nourished.  Eyes: Conjunctivae are normal.  Cardiovascular: Normal rate, regular rhythm, normal heart sounds and normal pulses.   Pulmonary/Chest: Effort normal and breath sounds normal. She has no  wheezes. She has no rhonchi. She has no rales.  Neurological: She is alert.  Skin: Skin is warm and dry.  Psychiatric: She has a normal mood and affect. Her speech is normal and behavior is normal. Thought content normal.  Vitals reviewed.      Assessment & Plan:   Problem List Items Addressed This Visit      Cardiovascular and Mediastinum   PVC's (premature ventricular contractions)    Stable. propranolol 10 mg once a day. No longer desires to follow with Gollan at this time. Advised her to remain vigilant and let me know if any new symptoms. Refilled propanolol. Will follow.      Relevant Medications   propranolol  (INDERAL) 20 MG tablet     Digestive   Hepatic cyst    Reviewed finding with patient today. She politely declined MRI this time that due to insurance. Pending liver function tests today. We will discuss at future visits.        Musculoskeletal and Integument   Atopic dermatitis    Stable. Continues to follow with dermatology        Other   Hemochromatosis    Stable. Follows with Dr. Janese Banks, hematology      Encounter to establish care - Primary    Reviewed history. Patient does physical with CNM. However screening labs were not performed this year. She consented for HIV screening in addition to the screening labs. She will come back when she is fasting.      Relevant Orders   CBC with Differential/Platelet   Comprehensive metabolic panel   Hemoglobin A1c   Lipid panel   TSH   VITAMIN D 25 Hydroxy (Vit-D Deficiency, Fractures)   HIV antibody       I have discontinued Ms. Weitz's estradiol. I am also having her maintain her naproxen sodium, levonorgestrel-ethinyl estradiol, ACZONE, and propranolol.   Meds ordered this encounter  Medications  . propranolol (INDERAL) 20 MG tablet    Sig: Take 1 tablet (20 mg total) by mouth 3 (three) times daily as needed.    Dispense:  90 tablet    Refill:  3    Return precautions given.   Risks, benefits, and alternatives of the medications and treatment plan prescribed today were discussed, and patient expressed understanding.   Education regarding symptom management and diagnosis given to patient on AVS.  Continue to follow with [name], FNP for routine health maintenance.   Adrienne Harris and I agreed with plan.   Mable Paris, FNP

## 2016-07-10 NOTE — Assessment & Plan Note (Signed)
Stable. Follows with Dr. Janese Banks, hematology

## 2016-07-10 NOTE — Progress Notes (Signed)
Pre visit review using our clinic review tool, if applicable. No additional management support is needed unless otherwise documented below in the visit note. 

## 2016-07-24 ENCOUNTER — Other Ambulatory Visit: Payer: 59

## 2016-08-21 ENCOUNTER — Other Ambulatory Visit (INDEPENDENT_AMBULATORY_CARE_PROVIDER_SITE_OTHER): Payer: 59

## 2016-08-21 DIAGNOSIS — Z7689 Persons encountering health services in other specified circumstances: Secondary | ICD-10-CM

## 2016-08-21 LAB — TSH: TSH: 1.15 u[IU]/mL (ref 0.35–4.50)

## 2016-08-21 LAB — LIPID PANEL
CHOLESTEROL: 150 mg/dL (ref 0–200)
HDL: 68 mg/dL (ref >39.00)
LDL Cholesterol: 71 mg/dL (ref 0–99)
NONHDL: 82.03
TRIGLYCERIDES: 55 mg/dL (ref 0.0–149.0)
Total CHOL/HDL Ratio: 2
VLDL: 11 mg/dL (ref 0.0–40.0)

## 2016-08-21 LAB — COMPREHENSIVE METABOLIC PANEL
ALBUMIN: 4.1 g/dL (ref 3.5–5.2)
ALT: 11 U/L (ref 0–35)
AST: 13 U/L (ref 0–37)
Alkaline Phosphatase: 45 U/L (ref 39–117)
BUN: 10 mg/dL (ref 6–23)
CHLORIDE: 105 meq/L (ref 96–112)
CO2: 30 mEq/L (ref 19–32)
Calcium: 9.3 mg/dL (ref 8.4–10.5)
Creatinine, Ser: 0.72 mg/dL (ref 0.40–1.20)
GFR: 95.07 mL/min (ref >60.00)
Glucose, Bld: 96 mg/dL (ref 70–99)
POTASSIUM: 3.8 meq/L (ref 3.5–5.1)
SODIUM: 141 meq/L (ref 135–145)
Total Bilirubin: 0.5 mg/dL (ref 0.2–1.2)
Total Protein: 6.9 g/dL (ref 6.0–8.3)

## 2016-08-21 LAB — CBC WITH DIFFERENTIAL/PLATELET
BASOS PCT: 0.5 % (ref 0.0–3.0)
Basophils Absolute: 0 10*3/uL (ref 0.0–0.1)
EOS PCT: 0.6 % (ref 0.0–5.0)
Eosinophils Absolute: 0 10*3/uL (ref 0.0–0.7)
HCT: 43.6 % (ref 36.0–46.0)
HEMOGLOBIN: 14.7 g/dL (ref 12.0–15.0)
LYMPHS ABS: 1.9 10*3/uL (ref 0.7–4.0)
Lymphocytes Relative: 34 % (ref 12.0–46.0)
MCHC: 33.8 g/dL (ref 30.0–36.0)
MCV: 97.6 fl (ref 78.0–100.0)
MONO ABS: 0.4 10*3/uL (ref 0.1–1.0)
MONOS PCT: 7.1 % (ref 3.0–12.0)
Neutro Abs: 3.3 10*3/uL (ref 1.4–7.7)
Neutrophils Relative %: 57.8 % (ref 43.0–77.0)
Platelets: 248 10*3/uL (ref 150.0–400.0)
RBC: 4.47 Mil/uL (ref 3.87–5.11)
RDW: 12.4 % (ref 11.5–15.5)
WBC: 5.6 10*3/uL (ref 4.0–10.5)

## 2016-08-21 LAB — VITAMIN D 25 HYDROXY (VIT D DEFICIENCY, FRACTURES): VITD: 31.91 ng/mL (ref 30.00–100.00)

## 2016-08-21 LAB — HEMOGLOBIN A1C: HEMOGLOBIN A1C: 4.8 % (ref 4.6–6.5)

## 2016-08-22 LAB — HIV ANTIBODY (ROUTINE TESTING W REFLEX): HIV: NONREACTIVE

## 2016-10-23 DIAGNOSIS — H47232 Glaucomatous optic atrophy, left eye: Secondary | ICD-10-CM | POA: Diagnosis not present

## 2016-12-04 DIAGNOSIS — L570 Actinic keratosis: Secondary | ICD-10-CM | POA: Diagnosis not present

## 2016-12-04 DIAGNOSIS — D225 Melanocytic nevi of trunk: Secondary | ICD-10-CM | POA: Diagnosis not present

## 2016-12-04 DIAGNOSIS — X32XXXA Exposure to sunlight, initial encounter: Secondary | ICD-10-CM | POA: Diagnosis not present

## 2016-12-04 DIAGNOSIS — L700 Acne vulgaris: Secondary | ICD-10-CM | POA: Diagnosis not present

## 2017-01-23 DIAGNOSIS — H40003 Preglaucoma, unspecified, bilateral: Secondary | ICD-10-CM | POA: Diagnosis not present

## 2017-01-26 ENCOUNTER — Other Ambulatory Visit: Payer: Self-pay | Admitting: Oncology

## 2017-01-26 ENCOUNTER — Inpatient Hospital Stay: Payer: 59 | Attending: Oncology

## 2017-01-26 ENCOUNTER — Other Ambulatory Visit: Payer: Self-pay | Admitting: *Deleted

## 2017-01-26 DIAGNOSIS — Z85828 Personal history of other malignant neoplasm of skin: Secondary | ICD-10-CM | POA: Diagnosis not present

## 2017-01-26 DIAGNOSIS — K7689 Other specified diseases of liver: Secondary | ICD-10-CM | POA: Diagnosis not present

## 2017-01-26 DIAGNOSIS — Z79899 Other long term (current) drug therapy: Secondary | ICD-10-CM | POA: Insufficient documentation

## 2017-01-26 LAB — COMPREHENSIVE METABOLIC PANEL
ALT: 13 U/L — ABNORMAL LOW (ref 14–54)
ANION GAP: 6 (ref 5–15)
AST: 23 U/L (ref 15–41)
Albumin: 3.8 g/dL (ref 3.5–5.0)
Alkaline Phosphatase: 47 U/L (ref 38–126)
BUN: 12 mg/dL (ref 6–20)
CHLORIDE: 107 mmol/L (ref 101–111)
CO2: 26 mmol/L (ref 22–32)
Calcium: 9 mg/dL (ref 8.9–10.3)
Creatinine, Ser: 0.75 mg/dL (ref 0.44–1.00)
GFR calc non Af Amer: >60 mL/min (ref >60)
Glucose, Bld: 100 mg/dL — ABNORMAL HIGH (ref 65–99)
POTASSIUM: 3.7 mmol/L (ref 3.5–5.1)
SODIUM: 139 mmol/L (ref 135–145)
Total Bilirubin: 0.4 mg/dL (ref 0.3–1.2)
Total Protein: 7.2 g/dL (ref 6.5–8.1)

## 2017-01-26 LAB — CBC WITH DIFFERENTIAL/PLATELET
Basophils Absolute: 0 10*3/uL (ref 0–0.1)
Basophils Relative: 0 %
Eosinophils Absolute: 0.1 10*3/uL (ref 0–0.7)
Eosinophils Relative: 2 %
HEMATOCRIT: 43.5 % (ref 35.0–47.0)
HEMOGLOBIN: 14.8 g/dL (ref 12.0–16.0)
LYMPHS ABS: 1.4 10*3/uL (ref 1.0–3.6)
LYMPHS PCT: 34 %
MCH: 33.1 pg (ref 26.0–34.0)
MCHC: 34.1 g/dL (ref 32.0–36.0)
MCV: 97 fL (ref 80.0–100.0)
MONOS PCT: 8 %
Monocytes Absolute: 0.3 10*3/uL (ref 0.2–0.9)
NEUTROS ABS: 2.4 10*3/uL (ref 1.4–6.5)
NEUTROS PCT: 56 %
Platelets: 248 10*3/uL (ref 150–440)
RBC: 4.48 MIL/uL (ref 3.80–5.20)
RDW: 12.3 % (ref 11.5–14.5)
WBC: 4.3 10*3/uL (ref 3.6–11.0)

## 2017-01-26 LAB — FERRITIN: FERRITIN: 60 ng/mL (ref 11–307)

## 2017-01-26 LAB — IRON AND TIBC
Iron: 183 ug/dL — ABNORMAL HIGH (ref 28–170)
Saturation Ratios: 50 % — ABNORMAL HIGH (ref 10.4–31.8)
TIBC: 366 ug/dL (ref 250–450)
UIBC: 183 ug/dL

## 2017-01-26 NOTE — Progress Notes (Signed)
cbc

## 2017-01-28 DIAGNOSIS — H40003 Preglaucoma, unspecified, bilateral: Secondary | ICD-10-CM | POA: Diagnosis not present

## 2017-01-29 ENCOUNTER — Encounter: Payer: Self-pay | Admitting: Oncology

## 2017-01-29 ENCOUNTER — Inpatient Hospital Stay (HOSPITAL_BASED_OUTPATIENT_CLINIC_OR_DEPARTMENT_OTHER): Payer: 59 | Admitting: Oncology

## 2017-01-29 DIAGNOSIS — Z85828 Personal history of other malignant neoplasm of skin: Secondary | ICD-10-CM | POA: Diagnosis not present

## 2017-01-29 DIAGNOSIS — Z79899 Other long term (current) drug therapy: Secondary | ICD-10-CM

## 2017-01-29 DIAGNOSIS — R935 Abnormal findings on diagnostic imaging of other abdominal regions, including retroperitoneum: Secondary | ICD-10-CM

## 2017-01-29 DIAGNOSIS — K7689 Other specified diseases of liver: Secondary | ICD-10-CM | POA: Diagnosis not present

## 2017-01-29 MED ORDER — ALPRAZOLAM 0.5 MG PO TABS: 0.5000 mg | ORAL_TABLET | Freq: Once | ORAL | 0 refills | Status: AC

## 2017-01-29 NOTE — Progress Notes (Signed)
Patient here for follow up today with lab results and possible phlebotomy. She states that she is feeling well and denies having any pain.

## 2017-01-29 NOTE — Addendum Note (Signed)
Addended by: Luella Cook on: 01/29/2017 09:41 AM   Modules accepted: Orders

## 2017-01-29 NOTE — Progress Notes (Signed)
Hematology/Oncology Consult note [facility]  Telephone:([phone] [phone]  Patient Care Team: [name], FNP as PCP - General (Family Medicine)   Name of the patient: Adrienne Harris  [id number]  [birth year]   Date of visit: 01/29/17  Diagnosis- compound heterozygous hereditary hemochromatosis C282Y and H63D)   Chief complaint/ Reason for visit-routine follow-up of hemochromatosis  Heme/Onc history: Patient was diagnosed with hemochromatosis back in 2009.  She required multiple phlebotomies back then but has not required any since 2015.  Patient also had a CT abdomen in April 2018 which showed hepatic cyst but also showed a peripherally enhancing lesion in the right hepatic lobe for which MRI was recommended.  Family history significant for her mother who also has hereditary hemochromatosis  Interval history-she is doing well and denies any complaints today.  ECOG PS- 0 Pain scale- 0   Review of systems- Review of Systems  Constitutional: Negative for chills, fever, malaise/fatigue and weight loss.  HENT: Negative for congestion, ear discharge and nosebleeds.   Eyes: Negative for blurred vision.  Respiratory: Negative for cough, hemoptysis, sputum production, shortness of breath and wheezing.   Cardiovascular: Negative for chest pain, palpitations, orthopnea and claudication.  Gastrointestinal: Negative for abdominal pain, blood in stool, constipation, diarrhea, heartburn, melena, nausea and vomiting.  Genitourinary: Negative for dysuria, flank pain, frequency, hematuria and urgency.  Musculoskeletal: Negative for back pain, joint pain and myalgias.  Skin: Negative for rash.  Neurological: Negative for dizziness, tingling, focal weakness, seizures, weakness and headaches.  Endo/Heme/Allergies: Does not bruise/bleed easily.  Psychiatric/Behavioral: Negative for depression and suicidal ideas. The patient does not have insomnia.         Allergies  Allergen Reactions  . Cortizone-10 [Hydrocortisone]     Allergic to all topical corticosteriods  . Pollen Extract   . Propylene Glycol     Skin rash     Past Medical History:  Diagnosis Date  . Atopic dermatitis   . Basal cell carcinoma    15 years ago.  Marland Kitchen Hemochromatosis   . Palpitations    a. 04/2013 48h Holter: sinus rhythm/sinus arrhythmia, rare PAC's/PVC's.  . Shoulder pain, left 01/24/2015  . Shoulder pain, right 01/24/2015     Past Surgical History:  Procedure Laterality Date  . NO PAST SURGERIES      Social History   Socioeconomic History  . Marital status: Married    Spouse name: Not on file  . Number of children: Not on file  . Years of education: Not on file  . Highest education level: Not on file  Social Needs  . Financial resource strain: Not on file  . Food insecurity - worry: Not on file  . Food insecurity - inability: Not on file  . Transportation needs - medical: Not on file  . Transportation needs - non-medical: Not on file  Occupational History  . Not on file  Tobacco Use  . Smoking status: Never Smoker  . Smokeless tobacco: Never Used  Substance and Sexual Activity  . Alcohol use: Yes    Alcohol/week: 1.2 oz    Types: 2 Cans of beer per week    Comment: 1-2 beers a week  . Drug use: No  . Sexual activity: Not on file  Other Topics Concern  . Not on file  Social History Narrative   Married      Daughter -19 yrs.       Dietitian for [employer]    Family History  Problem Relation Age of Onset  . Heart disease Maternal Grandfather   . Hyperlipidemia Maternal Grandfather   . Hypertension Maternal Grandfather   . Hypertension Father   . Melanoma Father   . Hemochromatosis Mother   . Basal cell carcinoma Mother   . Hyperlipidemia Maternal Grandmother   . Breast cancer Paternal Aunt   . Kidney cancer Paternal Aunt      Current Outpatient Medications:  .  ACZONE 7.5 % GEL, , Disp: , Rfl: 2 .   levonorgestrel-ethinyl estradiol (NORDETTE) 0.15-30 MG-MCG tablet, Take 1 tablet by mouth daily., Disp: , Rfl:  .  naproxen sodium (ANAPROX) 550 MG tablet, Take 550 mg by mouth 2 (two) times daily as needed. Takes PRN around menstrual cycle due to HA, Disp: , Rfl:  .  propranolol (INDERAL) 20 MG tablet, Take 1 tablet (20 mg total) by mouth 3 (three) times daily as needed., Disp: 90 tablet, Rfl: 3  Physical exam:  Vitals:   01/29/17 0843 01/29/17 0845  BP:  (!) 142/99  Pulse:  73  Resp:  14  Temp: 98.4 F (36.9 C)   TempSrc: Tympanic   Weight: 127 lb (57.6 kg)    Physical Exam  Constitutional: She is oriented to person, place, and time and well-developed, well-nourished, and in no distress.  HENT:  Head: Normocephalic and atraumatic.  Eyes: EOM are normal. Pupils are equal, round, and reactive to light.  Neck: Normal range of motion.  Cardiovascular: Normal rate, regular rhythm and normal heart sounds.  Pulmonary/Chest: Effort normal and breath sounds normal.  Abdominal: Soft. Bowel sounds are normal.  Neurological: She is alert and oriented to person, place, and time.  Skin: Skin is warm and dry.     CMP Latest Ref Rng & Units 01/26/2017  Glucose 65 - 99 mg/dL 100(H)  BUN 6 - 20 mg/dL 12  Creatinine 0.44 - 1.00 mg/dL 0.75  Sodium 135 - 145 mmol/L 139  Potassium 3.5 - 5.1 mmol/L 3.7  Chloride 101 - 111 mmol/L 107  CO2 22 - 32 mmol/L 26  Calcium 8.9 - 10.3 mg/dL 9.0  Total Protein 6.5 - 8.1 g/dL 7.2  Total Bilirubin 0.3 - 1.2 mg/dL 0.4  Alkaline Phos 38 - 126 U/L 47  AST 15 - 41 U/L 23  ALT 14 - 54 U/L 13(L)   CBC Latest Ref Rng & Units 01/26/2017  WBC 3.6 - 11.0 K/uL 4.3  Hemoglobin 12.0 - 16.0 g/dL 14.8  Hematocrit 35.0 - 47.0 % 43.5  Platelets 150 - 440 K/uL 248      Assessment and plan- Patient is a 41 y.o. female with compound heterozygous hereditary hemochromatosis C282Y and H63D currently under observation  CBC and CMP done on 01/26/2017 was within normal  limits.  Ferritin is at 60.  Iron studies did show mildly increased iron saturation of 50%.  Given that ferritin is less than 100 I will hold off on phlebotomy at this time.  I will see her back in 1 years time with CBC CMP ferritin and iron studies a week prior  Given that MRI abdomen was recommended for the 2 cm lesion noted in the right hepatic lobe which was ultimately not done, I will proceed with an MRI at this time and give the patient to call with the results of the testing  Patient is also agreed to get me the genetic testing results that were done back in 2009   Visit Diagnosis 1. Hereditary hemochromatosis ([place])  Dr. Randa Evens, MD, MPH [place] [facility] Pager- [phone] 01/29/2017 9:06 AM

## 2017-02-09 ENCOUNTER — Ambulatory Visit
Admission: RE | Admit: 2017-02-09 | Discharge: 2017-02-09 | Disposition: A | Discharge status: Home / Self Care | Payer: 59 | Source: Ambulatory Visit | Attending: Oncology | Admitting: Oncology

## 2017-02-09 ENCOUNTER — Encounter: Payer: Self-pay | Admitting: Radiology

## 2017-02-09 DIAGNOSIS — R935 Abnormal findings on diagnostic imaging of other abdominal regions, including retroperitoneum: Secondary | ICD-10-CM | POA: Diagnosis not present

## 2017-02-09 DIAGNOSIS — K769 Liver disease, unspecified: Secondary | ICD-10-CM | POA: Diagnosis not present

## 2017-02-09 DIAGNOSIS — K7689 Other specified diseases of liver: Secondary | ICD-10-CM | POA: Diagnosis not present

## 2017-02-09 MED ORDER — GADOBENATE DIMEGLUMINE 529 MG/ML IV SOLN
10.0000 mL | Freq: Once | INTRAVENOUS | Status: AC | PRN
  Administered 2017-02-09: 9 mL via INTRAVENOUS

## 2017-04-01 ENCOUNTER — Encounter: Payer: 59 | Admitting: Nutrition

## 2017-04-27 ENCOUNTER — Other Ambulatory Visit: Payer: Self-pay | Admitting: Certified Nurse Midwife

## 2017-04-27 ENCOUNTER — Telehealth: Payer: Self-pay | Admitting: Certified Nurse Midwife

## 2017-04-27 DIAGNOSIS — Z1231 Encounter for screening mammogram for malignant neoplasm of breast: Secondary | ICD-10-CM

## 2017-04-27 MED ORDER — LEVONORGESTREL-ETHINYL ESTRAD 0.15-30 MG-MCG PO TABS: 1.0000 | ORAL_TABLET | Freq: Every day | ORAL | 0 refills | Status: DC

## 2017-04-27 NOTE — Telephone Encounter (Signed)
Patient needs refill on bc, annual scheduled 4/5 but patient will run out before that time if she can get enough to get to that point.  [facility] employee pharmacy

## 2017-04-27 NOTE — Telephone Encounter (Signed)
Left msg for pt rx sent in to [facility].

## 2017-05-07 ENCOUNTER — Ambulatory Visit
Admission: RE | Admit: 2017-05-07 | Discharge: 2017-05-07 | Disposition: A | Discharge status: Home / Self Care | Payer: 59 | Source: Ambulatory Visit | Attending: Certified Nurse Midwife | Admitting: Certified Nurse Midwife

## 2017-05-07 DIAGNOSIS — Z1231 Encounter for screening mammogram for malignant neoplasm of breast: Secondary | ICD-10-CM | POA: Diagnosis not present

## 2017-05-15 ENCOUNTER — Encounter: Payer: Self-pay | Admitting: Certified Nurse Midwife

## 2017-05-15 ENCOUNTER — Ambulatory Visit (INDEPENDENT_AMBULATORY_CARE_PROVIDER_SITE_OTHER): Payer: 59 | Admitting: Certified Nurse Midwife

## 2017-05-15 VITALS — BP 102/66 | HR 73 | Ht 66.0 in | Wt 129.0 lb

## 2017-05-15 DIAGNOSIS — G8929 Other chronic pain: Secondary | ICD-10-CM | POA: Diagnosis not present

## 2017-05-15 DIAGNOSIS — R1032 Left lower quadrant pain: Secondary | ICD-10-CM

## 2017-05-15 DIAGNOSIS — Z124 Encounter for screening for malignant neoplasm of cervix: Secondary | ICD-10-CM

## 2017-05-15 DIAGNOSIS — Z01419 Encounter for gynecological examination (general) (routine) without abnormal findings: Secondary | ICD-10-CM

## 2017-05-15 LAB — HM PAP SMEAR: HM Pap smear: NORMAL

## 2017-05-15 MED ORDER — LEVONORGESTREL-ETHINYL ESTRAD 0.15-30 MG-MCG PO TABS: 1.0000 | ORAL_TABLET | Freq: Every day | ORAL | 3 refills | Status: DC

## 2017-05-15 NOTE — Progress Notes (Signed)
Gynecology Annual Exam  PCP: [name], FNP  Chief Complaint:  Chief Complaint  Patient presents with  . Gynecologic Exam    History of Present Illness:Adrienne Harris is a 42 year old Caucasian/White female , G 1 P 1 0 0 1 , who presents for her annual exam . She is having continued problems with mild LLQ pain ( see 04/03/2016 progress note), which she thinks is muscular in etiology. A workup including a pelvic ultrasound and a CT of the abdomen was negative.  She also reports that she continues to have some spotting the week prior to her menses, lasting 1-2 days. Trial of additional estrogen did not resolve the spotting. This is not bothersome for her. Denies cramping with the BTB. No urinary complaints. Bms are daily and no change inconsistency. No fever.  Her menses are regular and her LMP was 04/22/2017. They occur every 28 days, they last 5 days, are light flow, and are without clots.   She denies dysmenorrhea, but has menstrual migraines and takes Anaprox DS for them with relief.  The patient's past medical history is notable for a history of hemochromotosis and is followed by Dr Janese Banks. Has not needed a phlebotomy since 2015. She has palpitations and takes propanolol qAM.  Since her last annual GYN exam dated 04/03/2016, she has had no other changes to her health.  She is sexually active. She is currently using birth control pills for contraception.  Her most recent pap smear was obtained 04/03/2016 and was negative.  She has had a recent mammogram 05/07/2017 and it was negative. There is a positive history of breast cancer in her paternal aunt. Genetic testing is not indicated. There is no family history of ovarian cancer.  The patient does do occasional self breast exams.  The patient does not smoke.  The patient does drink on the weekends.  The patient does not use illegal drugs.  The patient has not been exercising regularly since her last visit. The patient does get  adequate calcium in her diet.  She has had a recent cholesterol screen July 2018 and it was normal.  Review of Systems: Review of Systems  Constitutional: Negative for chills, fever and weight loss.  HENT: Positive for congestion (nasal congestion with recent cold). Negative for sinus pain and sore throat.   Eyes: Negative for blurred vision and pain.  Respiratory: Positive for cough. Negative for hemoptysis, shortness of breath and wheezing.   Cardiovascular: Negative for chest pain, palpitations and leg swelling.  Gastrointestinal: Negative for abdominal pain, blood in stool, diarrhea, heartburn, nausea and vomiting.  Genitourinary: Negative for dysuria, frequency, hematuria and urgency.  Musculoskeletal: Negative for back pain, joint pain and myalgias.  Skin: Negative for itching and rash.  Neurological: Negative for dizziness, tingling and headaches.  Endo/Heme/Allergies: Negative for environmental allergies and polydipsia. Does not bruise/bleed easily.       Negative for hirsutism   Psychiatric/Behavioral: Negative for depression. The patient is not nervous/anxious and does not have insomnia.     Past Medical History:  Past Medical History:  Diagnosis Date  . Atopic dermatitis   . Basal cell carcinoma    15 years ago.  Marland Kitchen Hemochromatosis    Heterozygous for two genes  . Palpitations    a. 04/2013 48h Holter: sinus rhythm/sinus arrhythmia, rare PAC's/PVC's.  . Shoulder pain, left 01/24/2015  . Shoulder pain, right 01/24/2015    Past Surgical History:  Past Surgical History:  Procedure  Laterality Date  . Excision of basal cell carcinoma    . WISDOM TOOTH EXTRACTION      Family History:  Family History  Problem Relation Age of Onset  . Hyperlipidemia Maternal Grandfather   . Hypertension Maternal Grandfather   . Diabetes Maternal Grandfather   . Stroke Maternal Grandfather   . Hypertension Father   . Melanoma Father   . Hemochromatosis Mother   . Basal cell  carcinoma Mother   . Hyperlipidemia Maternal Grandmother   . Hypertension Maternal Grandmother   . Osteoporosis Maternal Grandmother   . Breast cancer Paternal Aunt   . Kidney cancer Paternal Aunt   . Alzheimer's disease Paternal Aunt   . Alzheimer's disease Paternal Grandmother     Social History:  Social History   Socioeconomic History  . Marital status: Married    Spouse name: Not on file  . Number of children: 1  . Years of education: Not on file  . Highest education level: Not on file  Occupational History  . Occupation: Microbiologist  Social Needs  . Financial resource strain: Not on file  . Food insecurity:    Worry: Not on file    Inability: Not on file  . Transportation needs:    Medical: Not on file    Non-medical: Not on file  Tobacco Use  . Smoking status: Never Smoker  . Smokeless tobacco: Never Used  Substance and Sexual Activity  . Alcohol use: Yes    Alcohol/week: 1.2 oz    Types: 2 Cans of beer per week    Comment: 1-2 beers a week  . Drug use: No  . Sexual activity: Yes    Partners: Male    Birth control/protection: Pill  Lifestyle  . Physical activity:    Days per week: 0 days    Minutes per session: 0 min  . Stress: Only a little  Relationships  . Social connections:    Talks on phone: Not on file    Gets together: Not on file    Attends religious service: Not on file    Active member of club or organization: Not on file    Attends meetings of clubs or organizations: Not on file    Relationship status: Not on file  . Intimate partner violence:    Fear of current or ex partner: Not on file    Emotionally abused: Not on file    Physically abused: Not on file    Forced sexual activity: Not on file  Other Topics Concern  . Not on file  Social History Narrative   Married      Daughter -27 yrs.       Dietitian for [employer]    Allergies:  Allergies  Allergen Reactions  . Cortizone-10 [Hydrocortisone]     Allergic to all topical  corticosteriods  . Propylene Glycol     Skin rash    Medications: Prior to Admission medications   Medication Sig Start Date End Date Taking? Authorizing Provider  ACZONE 7.5 % GEL  01/24/16   [provider]  levonorgestrel-ethinyl estradiol (NORDETTE) 0.15-30 MG-MCG tablet Take 1 tablet by mouth daily. 04/27/17   Dalia Heading, CNM  naproxen sodium (ANAPROX) 550 MG tablet Take 550 mg by mouth 2 (two) times daily as needed. Takes PRN around menstrual cycle due to HA    [provider]  propranolol (INDERAL) 20 MG tablet Take 1 tablet (20 mg total) by mouth 3 (three) times daily as needed. 07/10/16  [name], FNP    Physical Exam Vitals: BP 102/66   Pulse 73   Ht 5\' 6"  (1.676 m)   Wt 129 lb (58.5 kg)   LMP 04/22/2017   BMI 20.82 kg/m   General: WF in NAD HEENT: normocephalic, anicteric Neck: no thyroid enlargement, no palpable nodules, no cervical lymphadenopathy  Pulmonary: No increased work of breathing, CTAB Cardiovascular: RRR, without murmur  Breast: Breast symmetrical, no tenderness, no palpable nodules or masses, no skin or nipple retraction present, no nipple discharge.  No axillary, infraclavicular or supraclavicular lymphadenopathy. Abdomen: Soft, non-tender, non-distended.  Umbilicus without lesions.  No hepatomegaly or masses palpable. No evidence of hernia. Genitourinary:  External: Normal external female genitalia.  Normal urethral meatus, normal  Bartholin's and Skene's glands.    Vagina: Normal vaginal mucosa, no evidence of prolapse.    Cervix: Grossly normal in appearance, no bleeding, non-tender  Uterus: Anteverted, normal size, shape, and consistency, mobile, and non-tender  Adnexa: No adnexal masses, non-tender  Rectal: deferred  Lymphatic: no evidence of inguinal lymphadenopathy Extremities: no edema, erythema, or tenderness Neurologic: Grossly intact Psychiatric: mood appropriate, affect full     Assessment: 42 y.o.  normal annual gyn exam  Plan:   1) Breast cancer screening - recommend monthly self breast exam and annual screening mammograms.. Mammogram is up to date.Marland Kitchen  2) Cervical cancer screening - Pap was done.   3) Contraception - Refills for Nordette called in x 1year  4) Routine healthcare maintenance including cholesterol and diabetes screening managed by PCP    5) RTO 1 year and prn.   Dalia Heading, CNM

## 2017-05-17 ENCOUNTER — Encounter: Payer: Self-pay | Admitting: Certified Nurse Midwife

## 2017-05-17 DIAGNOSIS — G8929 Other chronic pain: Secondary | ICD-10-CM | POA: Insufficient documentation

## 2017-05-17 DIAGNOSIS — R1032 Left lower quadrant pain: Secondary | ICD-10-CM

## 2017-05-20 LAB — IGP, APTIMA HPV
HPV APTIMA: NEGATIVE
PAP SMEAR COMMENT: 0

## 2017-07-10 ENCOUNTER — Ambulatory Visit (INDEPENDENT_AMBULATORY_CARE_PROVIDER_SITE_OTHER): Payer: 59 | Admitting: Family

## 2017-07-10 ENCOUNTER — Encounter: Payer: Self-pay | Admitting: Family

## 2017-07-10 VITALS — BP 118/86 | HR 69 | Temp 98.1°F | Ht 66.0 in | Wt 130.5 lb

## 2017-07-10 DIAGNOSIS — Z0001 Encounter for general adult medical examination with abnormal findings: Secondary | ICD-10-CM | POA: Diagnosis not present

## 2017-07-10 DIAGNOSIS — M25512 Pain in left shoulder: Secondary | ICD-10-CM

## 2017-07-10 DIAGNOSIS — G8929 Other chronic pain: Secondary | ICD-10-CM

## 2017-07-10 DIAGNOSIS — Z0000 Encounter for general adult medical examination without abnormal findings: Secondary | ICD-10-CM

## 2017-07-10 DIAGNOSIS — I493 Ventricular premature depolarization: Secondary | ICD-10-CM | POA: Diagnosis not present

## 2017-07-10 DIAGNOSIS — K7689 Other specified diseases of liver: Secondary | ICD-10-CM | POA: Diagnosis not present

## 2017-07-10 LAB — COMPREHENSIVE METABOLIC PANEL
ALK PHOS: 38 U/L — AB (ref 39–117)
ALT: 8 U/L (ref 0–35)
AST: 12 U/L (ref 0–37)
Albumin: 3.9 g/dL (ref 3.5–5.2)
BILIRUBIN TOTAL: 0.4 mg/dL (ref 0.2–1.2)
BUN: 10 mg/dL (ref 6–23)
CALCIUM: 8.8 mg/dL (ref 8.4–10.5)
CO2: 29 mEq/L (ref 19–32)
Chloride: 105 mEq/L (ref 96–112)
Creatinine, Ser: 0.66 mg/dL (ref 0.40–1.20)
GFR: 104.65 mL/min (ref >60.00)
Glucose, Bld: 93 mg/dL (ref 70–99)
Potassium: 3.6 mEq/L (ref 3.5–5.1)
Sodium: 140 mEq/L (ref 135–145)
TOTAL PROTEIN: 6.8 g/dL (ref 6.0–8.3)

## 2017-07-10 MED ORDER — DICLOFENAC SODIUM 1 % TD GEL: 4.0000 g | Freq: Four times a day (QID) | TRANSDERMAL | 3 refills | Status: DC

## 2017-07-10 NOTE — Patient Instructions (Addendum)
Monitor blood pressure,  Goal is less than 130/80; if persistently higher, please make sooner follow up appointment so we can recheck you blood pressure and manage medications   Labs today   As discussed today regarding colon cancer risk ( CRC)  and your family history of polyps.   Typically we start screening at 42 years old for AVERAGE risk people.   We assess the risk for CRC at the initial visit for an adult who is age 65 years or older to identify high-risk patients who should begin CRC screening at an earlier age than those at average risk. Subsequent reassessment every three to five years identifies whether the patient or family members have developed factors that raise the patient's level of risk for CRC  Factors important to determine CRC risk can be assessed by asking several questions. A "no" response to all of these questions generally indicates AVERAGE risk.  ?Have you ever had CRC or an adenomatous polyp? A personal history of CRC increases the risk of another primary (metachronous) cancer. Surveillance following CRC is discussed separately. A personal history of adenomatous colorectal polyps increases the risk of CRC . The number and types of polyp lesions guide the determination of the appropriate interval for surveillance.  If there is uncertainty about the patient's personal polyp history, records should be obtained to determine if the patient had an adenomatous polyp. If records cannot be obtained, the patient may recall being told a polyp was found and advised to have a follow-up colonoscopy in five years or sooner, suggesting the polyp was adenomatous.  ?Have any family members had CRC or a documented advanced polyp? An advanced polyp is defined as an advanced adenoma (adenoma ?1 cm, or with high-grade dysplasia, or with villous elements) or advanced serrated lesion (sessile serrated polyp [SSP] ?1 cm, or traditional serrated adenoma ?1 cm, or SSP with cytologic  dysplasia).  The [place] Multi-Society Task Force (MSTF) on Colorectal Cancer recommends that, if there is documentation that an FDR had an advanced adenoma (adenoma ?1 cm, or with high-grade dysplasia, or with villous elements) or polyp requiring surgical excision, this should be weighted the same as having an FDR with CRC when suggesting screening programs [20]. The yield of screening colonoscopy in patients with FDRs who had advanced adenomas is substantially increased. However, the MSTF no longer recommends that patients undergo intensified colon screening without clear documentation that an FDR's polyp was advanced; in the absence of documentation that an FDR's polyp was advanced, it should be assumed it was not advanced   If so, how many family members, were they first-degree relatives (parent, sibling, or child), and at what age was the cancer or polyp first diagnosed? Enhanced screening is warranted for a patient with increased risk of CRC due to a family history of CRC or documented advanced polyp and is described in detail separately. If a family member is reported to have had a polyp but there is lack of available documentation about the type of polyp, the patient is typically screened as if a family member did not have an advanced polyp. If the patient cannot obtain any family history whatsoever related to colorectal cancer or polyps, some experts suggest screening the patient as average risk, although there are no data that evaluate that approach. ?Do you have family members with any of the known genetic syndromes ( such as Lynch Syndrome) that can cause CRC? Patients with a family history of a known genetic syndrome for CRC may require enhanced  screening plus genetic counseling.   Obtaining family history permits determination of the number of First Degree Relatives( FDR) diagnosed with ANY of the following: ?Colorectal cancer (CRC) ?Documented advanced polyp with ANY of the  following: .Advanced adenoma -Adenoma size ?1 cm -Adenoma with high-grade dysplasia -Adenoma with villous elements .Advanced serrated lesion -Sessile serrated polyp (SSP) ?1 cm -Traditional serrated adenoma ?1 cm -SSP with cytologic dysplasia  We suggest using the information about first-degree relatives (FDRs) with ANY of the above to screen as follows: ?One FDR diagnosed at age <60 years - Begin screening at age 63 years, or 10 years before the FDR's diagnosis, whichever is earlier. We suggest colonoscopy every five years. If the patient declines colonoscopy, annual fecal immunochemical testing (FIT) should be offered. ?Two or more FDRs diagnosed at any age - Begin screening at age 79 years, or 10 years before the youngest FDR's diagnosis, whichever is earlier. We suggest colonoscopy every five years. If the patient declines colonoscopy, annual FIT should be offered. ?One FDR diagnosed at age ?15 years - Begin screening at age 54 years, using the same screening options as for average-risk patients, at the same frequency as for average-risk patients. If the only family history is an FDR with a polyp NOT clearly documented as an advanced adenoma or serrated lesion, we suggest that the patient be screened as an average-risk patient, because of the potential inaccuracy of the family history. This is in contrast to patients whose FDR has a documented history of an advanced adenoma or serrated lesion, who should be screened similarly to those with a family history of CRC, as indicated above.  These suggestions are in keeping with the 2017 guidelines from the [place] Multi-Society Task Force (MSTF) on Colorectal Cancer, issued collaboratively on behalf of the [employer], the [employer], and the [employer] [facility]    Health Maintenance, Female Adopting a healthy lifestyle and getting preventive care can go a long way to  promote health and wellness. Talk with your health care provider about what schedule of regular examinations is right for you. This is a good chance for you to check in with your provider about disease prevention and staying healthy. In between checkups, there are plenty of things you can do on your own. Experts have done a lot of research about which lifestyle changes and preventive measures are most likely to keep you healthy. Ask your health care provider for more information. Weight and diet Eat a healthy diet  Be sure to include plenty of vegetables, fruits, low-fat dairy products, and lean protein.  Do not eat a lot of foods high in solid fats, added sugars, or salt.  Get regular exercise. This is one of the most important things you can do for your health. ? Most adults should exercise for at least 150 minutes each week. The exercise should increase your heart rate and make you sweat (moderate-intensity exercise). ? Most adults should also do strengthening exercises at least twice a week. This is in addition to the moderate-intensity exercise.  Maintain a healthy weight  Body mass index (BMI) is a measurement that can be used to identify possible weight problems. It estimates body fat based on height and weight. Your health care provider can help determine your BMI and help you achieve or maintain a healthy weight.  For females 27 years of age and older: ? A BMI below 18.5 is considered underweight. ? A BMI of 18.5 to 24.9 is  normal. ? A BMI of 25 to 29.9 is considered overweight. ? A BMI of 30 and above is considered obese.  Watch levels of cholesterol and blood lipids  You should start having your blood tested for lipids and cholesterol at 42 years of age, then have this test every 5 years.  You may need to have your cholesterol levels checked more often if: ? Your lipid or cholesterol levels are high. ? You are older than 42 years of age. ? You are at high risk for heart  disease.  Cancer screening Lung Cancer  Lung cancer screening is recommended for adults 70-58 years old who are at high risk for lung cancer because of a history of smoking.  A yearly low-dose CT scan of the lungs is recommended for people who: ? Currently smoke. ? Have quit within the past 15 years. ? Have at least a 30-pack-year history of smoking. A pack year is smoking an average of one pack of cigarettes a day for 1 year.  Yearly screening should continue until it has been 15 years since you quit.  Yearly screening should stop if you develop a health problem that would prevent you from having lung cancer treatment.  Breast Cancer  Practice breast self-awareness. This means understanding how your breasts normally appear and feel.  It also means doing regular breast self-exams. Let your health care provider know about any changes, no matter how small.  If you are in your 20s or 30s, you should have a clinical breast exam (CBE) by a health care provider every 1-3 years as part of a regular health exam.  If you are 24 or older, have a CBE every year. Also consider having a breast X-ray (mammogram) every year.  If you have a family history of breast cancer, talk to your health care provider about genetic screening.  If you are at high risk for breast cancer, talk to your health care provider about having an MRI and a mammogram every year.  Breast cancer gene (BRCA) assessment is recommended for women who have family members with BRCA-related cancers. BRCA-related cancers include: ? Breast. ? Ovarian. ? Tubal. ? Peritoneal cancers.  Results of the assessment will determine the need for genetic counseling and BRCA1 and BRCA2 testing.  Cervical Cancer Your health care provider may recommend that you be screened regularly for cancer of the pelvic organs (ovaries, uterus, and vagina). This screening involves a pelvic examination, including checking for microscopic changes to the  surface of your cervix (Pap test). You may be encouraged to have this screening done every 3 years, beginning at age 86.  For women ages 32-65, health care providers may recommend pelvic exams and Pap testing every 3 years, or they may recommend the Pap and pelvic exam, combined with testing for human papilloma virus (HPV), every 5 years. Some types of HPV increase your risk of cervical cancer. Testing for HPV may also be done on women of any age with unclear Pap test results.  Other health care providers may not recommend any screening for nonpregnant women who are considered low risk for pelvic cancer and who do not have symptoms. Ask your health care provider if a screening pelvic exam is right for you.  If you have had past treatment for cervical cancer or a condition that could lead to cancer, you need Pap tests and screening for cancer for at least 20 years after your treatment. If Pap tests have been discontinued, your risk factors (such as  having a new sexual partner) need to be reassessed to determine if screening should resume. Some women have medical problems that increase the chance of getting cervical cancer. In these cases, your health care provider may recommend more frequent screening and Pap tests.  Colorectal Cancer  This type of cancer can be detected and often prevented.  Routine colorectal cancer screening usually begins at 42 years of age and continues through 42 years of age.  Your health care provider may recommend screening at an earlier age if you have risk factors for colon cancer.  Your health care provider may also recommend using home test kits to check for hidden blood in the stool.  A small camera at the end of a tube can be used to examine your colon directly (sigmoidoscopy or colonoscopy). This is done to check for the earliest forms of colorectal cancer.  Routine screening usually begins at age 19.  Direct examination of the colon should be repeated every 5-10  years through 42 years of age. However, you may need to be screened more often if early forms of precancerous polyps or small growths are found.  Skin Cancer  Check your skin from head to toe regularly.  Tell your health care provider about any new moles or changes in moles, especially if there is a change in a mole's shape or color.  Also tell your health care provider if you have a mole that is larger than the size of a pencil eraser.  Always use sunscreen. Apply sunscreen liberally and repeatedly throughout the day.  Protect yourself by wearing long sleeves, pants, a wide-brimmed hat, and sunglasses whenever you are outside.  Heart disease, diabetes, and high blood pressure  High blood pressure causes heart disease and increases the risk of stroke. High blood pressure is more likely to develop in: ? People who have blood pressure in the high end of the normal range (130-139/85-89 mm Hg). ? People who are overweight or obese. ? People who are African American.  If you are 51-46 years of age, have your blood pressure checked every 3-5 years. If you are 102 years of age or older, have your blood pressure checked every year. You should have your blood pressure measured twice-once when you are at a hospital or clinic, and once when you are not at a hospital or clinic. Record the average of the two measurements. To check your blood pressure when you are not at a hospital or clinic, you can use: ? An automated blood pressure machine at a pharmacy. ? A home blood pressure monitor.  If you are between 10 years and 70 years old, ask your health care provider if you should take aspirin to prevent strokes.  Have regular diabetes screenings. This involves taking a blood sample to check your fasting blood sugar level. ? If you are at a normal weight and have a low risk for diabetes, have this test once every three years after 42 years of age. ? If you are overweight and have a high risk for  diabetes, consider being tested at a younger age or more often. Preventing infection Hepatitis B  If you have a higher risk for hepatitis B, you should be screened for this virus. You are considered at high risk for hepatitis B if: ? You were born in a country where hepatitis B is common. Ask your health care provider which countries are considered high risk. ? Your parents were born in a high-risk country, and you have  not been immunized against hepatitis B (hepatitis B vaccine). ? You have HIV or AIDS. ? You use needles to inject street drugs. ? You live with someone who has hepatitis B. ? You have had sex with someone who has hepatitis B. ? You get hemodialysis treatment. ? You take certain medicines for conditions, including cancer, organ transplantation, and autoimmune conditions.  Hepatitis C  Blood testing is recommended for: ? Everyone born from [birth year] through 1965. ? Anyone with known risk factors for hepatitis C.  Sexually transmitted infections (STIs)  You should be screened for sexually transmitted infections (STIs) including gonorrhea and chlamydia if: ? You are sexually active and are younger than 42 years of age. ? You are older than 42 years of age and your health care provider tells you that you are at risk for this type of infection. ? Your sexual activity has changed since you were last screened and you are at an increased risk for chlamydia or gonorrhea. Ask your health care provider if you are at risk.  If you do not have HIV, but are at risk, it may be recommended that you take a prescription medicine daily to prevent HIV infection. This is called pre-exposure prophylaxis (PrEP). You are considered at risk if: ? You are sexually active and do not regularly use condoms or know the HIV status of your partner(s). ? You take drugs by injection. ? You are sexually active with a partner who has HIV.  Talk with your health care provider about whether you are at high risk  of being infected with HIV. If you choose to begin PrEP, you should first be tested for HIV. You should then be tested every 3 months for as long as you are taking PrEP. Pregnancy  If you are premenopausal and you may become pregnant, ask your health care provider about preconception counseling.  If you may become pregnant, take 400 to 800 micrograms (mcg) of folic acid every day.  If you want to prevent pregnancy, talk to your health care provider about birth control (contraception). Osteoporosis and menopause  Osteoporosis is a disease in which the bones lose minerals and strength with aging. This can result in serious bone fractures. Your risk for osteoporosis can be identified using a bone density scan.  If you are 39 years of age or older, or if you are at risk for osteoporosis and fractures, ask your health care provider if you should be screened.  Ask your health care provider whether you should take a calcium or vitamin D supplement to lower your risk for osteoporosis.  Menopause may have certain physical symptoms and risks.  Hormone replacement therapy may reduce some of these symptoms and risks. Talk to your health care provider about whether hormone replacement therapy is right for you. Follow these instructions at home:  Schedule regular health, dental, and eye exams.  Stay current with your immunizations.  Do not use any tobacco products including cigarettes, chewing tobacco, or electronic cigarettes.  If you are pregnant, do not drink alcohol.  If you are breastfeeding, limit how much and how often you drink alcohol.  Limit alcohol intake to no more than 1 drink per day for nonpregnant women. One drink equals 12 ounces of beer, 5 ounces of wine, or 1 ounces of hard liquor.  Do not use street drugs.  Do not share needles.  Ask your health care provider for help if you need support or information about quitting drugs.  Tell your health care  provider if you often  feel depressed.  Tell your health care provider if you have ever been abused or do not feel safe at home. This information is not intended to replace advice given to you by your health care provider. Make sure you discuss any questions you have with your health care provider. Document Released: 08/12/2010 Document Revised: 07/05/2015 Document Reviewed: 10/31/2014 Elsevier Interactive Patient Education  [employer].

## 2017-07-10 NOTE — Assessment & Plan Note (Signed)
Clinical breast exam performed.  Deferred pelvic exam as Pap is up-to-date, patient has no pelvic concerns today.  Patient has family history of Polyps and she will discuss with her parents the type of polyps they had-literature given on her AVS.

## 2017-07-10 NOTE — Progress Notes (Signed)
Subjective:    Patient ID: Adrienne Harris, female    DOB: Mar 23, 1975, 42 y.o.   MRN: [id number]  CC: Adrienne Harris is a 42 y.o. female who presents today for physical exam.    HPI: Overall feeling well today.   Continues to follow with Dr Janese Banks for hemachromatosis.  Had MR Abdomen showed likeley benign adenoma.   Palpitations- doing well on propranolol qd; rarely such as 2-3 in past year has taken a second dose. No CP, SOB.   Chronic left shoulder pain 3 years, waxes and waned. Chronic dull ache even when still. No numbness. No reduction in ROM.  Improved with mobic,  PT, dry needling for short period of time. Had seen Dr Mack Guise, per patient images done and were normal at that time.    Colorectal Cancer Screening: no early family history Breast Cancer Screening: Mammogram utd Cervical Cancer Screening: UTD, negative malignancy, hpv with OB GYN. No pelvic pain.  Bone Health screening/DEXA for 65+: No increased fracture risk. Defer screening at this time. Lung Cancer Screening: Doesn't have 30 year pack year history and age > 49 years.       Tetanus - due         Labs: Screening labs today. Exercise: Gets regular exercise.  Alcohol use: 1-2 beers per week Smoking/tobacco use: Nonsmoker.  Regular dental exams: UTD Wears seat belt: Yes. Skin: h/o basal cell ; follows annually.   HISTORY:  Past Medical History:  Diagnosis Date  . Atopic dermatitis   . Basal cell carcinoma    15 years ago.  Marland Kitchen Hemochromatosis    Heterozygous for two genes  . Palpitations    a. 04/2013 48h Holter: sinus rhythm/sinus arrhythmia, rare PAC's/PVC's.  . Shoulder pain, left 01/24/2015  . Shoulder pain, right 01/24/2015    Past Surgical History:  Procedure Laterality Date  . Excision of basal cell carcinoma    . WISDOM TOOTH EXTRACTION     Family History  Problem Relation Age of Onset  . Hyperlipidemia Maternal Grandfather   . Hypertension Maternal Grandfather   . Diabetes Maternal Grandfather    . Stroke Maternal Grandfather   . Hypertension Father   . Melanoma Father   . Hemochromatosis Mother   . Basal cell carcinoma Mother   . Hyperlipidemia Maternal Grandmother   . Hypertension Maternal Grandmother   . Osteoporosis Maternal Grandmother   . Breast cancer Paternal Aunt   . Kidney cancer Paternal Aunt   . Alzheimer's disease Paternal Aunt   . Alzheimer's disease Paternal Grandmother   . Colon cancer Neg Hx       ALLERGIES: Cortizone-10 [hydrocortisone] and Propylene glycol  Current Outpatient Medications on File Prior to Visit  Medication Sig Dispense Refill  . ACZONE 7.5 % GEL   2  . ibuprofen (ADVIL,MOTRIN) 200 MG tablet Take 200 mg by mouth every 6 (six) hours as needed.    Marland Kitchen levonorgestrel-ethinyl estradiol (NORDETTE) 0.15-30 MG-MCG tablet Take 1 tablet by mouth daily. 3 Package 3  . propranolol (INDERAL) 20 MG tablet Take 1 tablet (20 mg total) by mouth 3 (three) times daily as needed. (Patient taking differently: Take 20 mg by mouth daily. ) 90 tablet 3   No current facility-administered medications on file prior to visit.     Social History   Tobacco Use  . Smoking status: Never Smoker  . Smokeless tobacco: Never Used  Substance Use Topics  . Alcohol use: Yes    Alcohol/week: 1.2 oz  Types: 2 Cans of beer per week    Comment: 1-2 beers a week  . Drug use: No    Review of Systems  Constitutional: Negative for chills, fever and unexpected weight change.  HENT: Negative for congestion.   Respiratory: Negative for cough and shortness of breath.   Cardiovascular: Positive for palpitations. Negative for chest pain and leg swelling.  Gastrointestinal: Negative for nausea and vomiting.  Genitourinary: Negative for dysuria.  Musculoskeletal: Negative for arthralgias, myalgias and neck pain.  Skin: Negative for rash.  Neurological: Negative for numbness and headaches.  Hematological: Negative for adenopathy.  Psychiatric/Behavioral: Negative for  confusion.      Objective:    BP 118/86 (BP Location: Left Arm, Patient Position: Sitting, Cuff Size: Normal)   Pulse 69   Temp 98.1 F (36.7 C) (Oral)   Ht 5\' 6"  (1.676 m)   Wt 130 lb 8 oz (59.2 kg)   LMP 06/16/2017   SpO2 98%   BMI 21.06 kg/m   BP Readings from Last 3 Encounters:  07/10/17 118/86  05/15/17 102/66  01/29/17 (!) 142/99   Wt Readings from Last 3 Encounters:  07/10/17 130 lb 8 oz (59.2 kg)  05/15/17 129 lb (58.5 kg)  01/29/17 127 lb (57.6 kg)    Physical Exam  Constitutional: She appears well-developed and well-nourished.  Eyes: Conjunctivae are normal.  Neck: No thyroid mass and no thyromegaly present.  Cardiovascular: Normal rate, regular rhythm, normal heart sounds and normal pulses.  Pulmonary/Chest: Effort normal and breath sounds normal. She has no wheezes. She has no rhonchi. She has no rales. Right breast exhibits no inverted nipple, no mass, no nipple discharge, no skin change and no tenderness. Left breast exhibits no inverted nipple, no mass, no nipple discharge, no skin change and no tenderness. Breasts are symmetrical.  CBE performed.   Musculoskeletal:       Left shoulder: She exhibits pain. She exhibits normal range of motion, no tenderness, no bony tenderness, no swelling, no spasm and normal strength.       Cervical back: She exhibits normal range of motion and no tenderness.       Back:  Left Shoulder:   Pain noted extraarticular to left shoulder joint; as noted on diagram.   No asymmetry of shoulders when comparing right and left.No pain with palpation over glenohumeral joint lines, Kraemer joint, AC joint, or bicipital groove. No pain with internal and external rotation. No pain with resisted lateral extension .   Negative active painful arc sign. Negative passive arc ( Neer's). Negative drop arm.   No pain, swelling, or ecchymosis noted over long head of biceps.  No bicep pain with resisted flexion and extension.  Negative speeds test.  Negative Popeye's sign.   Strength and sensation normal BUE's.   Lymphadenopathy:       Head (right side): No submental, no submandibular, no tonsillar, no preauricular, no posterior auricular and no occipital adenopathy present.       Head (left side): No submental, no submandibular, no tonsillar, no preauricular, no posterior auricular and no occipital adenopathy present.    She has no cervical adenopathy.       Right cervical: No superficial cervical, no deep cervical and no posterior cervical adenopathy present.      Left cervical: No superficial cervical, no deep cervical and no posterior cervical adenopathy present.    She has no axillary adenopathy.  Neurological: She is alert.  Skin: Skin is warm and dry.  Psychiatric: She  has a normal mood and affect. Her speech is normal and behavior is normal. Thought content normal.  Vitals reviewed.      Assessment & Plan:   Problem List Items Addressed This Visit      Cardiovascular and Mediastinum   PVC's (premature ventricular contractions)    Stable.  Continue current regimen.        Digestive   Hepatic cyst     Other   Routine physical examination - Primary    Clinical breast exam performed.  Deferred pelvic exam as Pap is up-to-date, patient has no pelvic concerns today.  Patient has family history of Polyps and she will discuss with her parents the type of polyps they had-literature given on her AVS.      Relevant Orders   Comprehensive metabolic panel   Chronic left shoulder pain    Reassuring exam.  Pain actually appears to be extra-articular.  Based on duration of symptoms, patient jointly agreed to consult sports medicine as appropriate next step.  In the interim, I advised heat, Voltaren gel.Will follow.       Relevant Medications   ibuprofen (ADVIL,MOTRIN) 200 MG tablet   diclofenac sodium (VOLTAREN) 1 % GEL   Other Relevant Orders   Ambulatory referral to Sports Medicine       I have discontinued Kamoria M.  Rebert's naproxen sodium. I am also having her start on diclofenac sodium. Additionally, I am having her maintain her ACZONE, propranolol, levonorgestrel-ethinyl estradiol, and ibuprofen.   Meds ordered this encounter  Medications  . diclofenac sodium (VOLTAREN) 1 % GEL    Sig: Apply 4 g topically 4 (four) times daily.    Dispense:  1 Tube    Refill:  3    Order Specific Question:   Supervising Provider    Answer:   Crecencio Mc [[id number]]    Return precautions given.   Risks, benefits, and alternatives of the medications and treatment plan prescribed today were discussed, and patient expressed understanding.   Education regarding symptom management and diagnosis given to patient on AVS.   Continue to follow with [name], FNP for routine health maintenance.   Adrienne Harris and I agreed with plan.   Mable Paris, FNP

## 2017-07-10 NOTE — Assessment & Plan Note (Signed)
Stable  Continue current regimen  

## 2017-07-10 NOTE — Assessment & Plan Note (Signed)
Reassuring exam.  Pain actually appears to be extra-articular.  Based on duration of symptoms, patient jointly agreed to consult sports medicine as appropriate next step.  In the interim, I advised heat, Voltaren gel.Will follow.

## 2017-07-13 ENCOUNTER — Encounter (INDEPENDENT_AMBULATORY_CARE_PROVIDER_SITE_OTHER): Payer: Self-pay

## 2017-07-28 ENCOUNTER — Ambulatory Visit: Payer: 59 | Admitting: Family Medicine

## 2017-08-03 ENCOUNTER — Encounter: Payer: Self-pay | Admitting: Family

## 2017-08-04 NOTE — Progress Notes (Signed)
[name] [place], [place] [address] Phone: [phone] Subjective:    I'm seeing this patient by the request  of:  [name], FNP   CC: Left shoulder pain  WSF:[id number]  Adrienne Harris is a 42 y.o. female coming in with complaint of left shoulder and neck pain. After she stopped PT the pain returned. Gets headaches around the time she starts her menstrual cycle. No numbness and tingling. Neck pops.   Onset- Chronic (3 years) Location- lateral neck Duration- Daily  Character- dull  Aggravating factors-certain movements have been doing a lot of repetitive activity Reliving factors-as stated below Therapies tried- Massage therapy, moloxicam, PT 2x a week, Pennsaid Severity-4 out of 10     Past Medical History:  Diagnosis Date  . Atopic dermatitis   . Basal cell carcinoma    15 years ago.  Marland Kitchen Hemochromatosis    Heterozygous for two genes  . Palpitations    a. 04/2013 48h Holter: sinus rhythm/sinus arrhythmia, rare PAC's/PVC's.  . Shoulder pain, left 01/24/2015  . Shoulder pain, right 01/24/2015   Past Surgical History:  Procedure Laterality Date  . Excision of basal cell carcinoma    . WISDOM TOOTH EXTRACTION     Social History   Socioeconomic History  . Marital status: Married    Spouse name: Not on file  . Number of children: 1  . Years of education: Not on file  . Highest education level: Not on file  Occupational History  . Occupation: Microbiologist  Social Needs  . Financial resource strain: Not on file  . Food insecurity:    Worry: Not on file    Inability: Not on file  . Transportation needs:    Medical: Not on file    Non-medical: Not on file  Tobacco Use  . Smoking status: Never Smoker  . Smokeless tobacco: Never Used  Substance and Sexual Activity  . Alcohol use: Yes    Alcohol/week: 1.2 oz    Types: 2 Cans of beer per week    Comment: 1-2 beers a week  . Drug use: No  . Sexual activity: Yes    Partners: Male    Birth control/protection: Pill  Lifestyle  . Physical activity:    Days per week: 0 days    Minutes per session: 0 min  . Stress: Only a little  Relationships  . Social connections:    Talks on phone: Not on file    Gets together: Not on file    Attends religious service: Not on file    Active member of club or organization: Not on file    Attends meetings of clubs or organizations: Not on file    Relationship status: Not on file  Other Topics Concern  . Not on file  Social History Narrative   Married      Daughter -65 yrs.       Dietitian for [employer]   Allergies  Allergen Reactions  . Cortizone-10 [Hydrocortisone]     Allergic to all topical corticosteriods  . Propylene Glycol     Skin rash   Family History  Problem Relation Age of Onset  . Hyperlipidemia Maternal Grandfather   . Hypertension Maternal Grandfather   . Diabetes Maternal Grandfather   . Stroke Maternal Grandfather   . Hypertension Father   . Melanoma Father   . Hemochromatosis Mother   . Basal cell carcinoma Mother   . Hyperlipidemia Maternal Grandmother   .  Hypertension Maternal Grandmother   . Osteoporosis Maternal Grandmother   . Breast cancer Paternal Aunt   . Kidney cancer Paternal Aunt   . Alzheimer's disease Paternal Aunt   . Alzheimer's disease Paternal Grandmother   . Colon cancer Neg Hx      Past medical history, social, surgical and family history all reviewed in electronic medical record.  No pertanent information unless stated regarding to the chief complaint.   Review of Systems:Review of systems updated and as accurate as of 08/05/17  No headache, visual changes, nausea, vomiting, diarrhea, constipation, dizziness, abdominal pain, skin rash, fevers, chills, night sweats, weight loss, swollen lymph nodes, body aches, joint swelling, muscle aches, chest pain, shortness of breath, mood changes.   Objective  Blood pressure 124/88, pulse 75, height 5\' 6"  (1.676 m),  weight 132 lb (59.9 kg), SpO2 98 %. Systems examined below as of 08/05/17   General: No apparent distress alert and oriented x3 mood and affect normal, dressed appropriately.  HEENT: Pupils equal, extraocular movements intact  Respiratory: Patient's speak in full sentences and does not appear short of breath  Cardiovascular: No lower extremity edema, non tender, no erythema  Skin: Warm dry intact with no signs of infection or rash on extremities or on axial skeleton.  Abdomen: Soft nontender  Neuro: Cranial nerves II through XII are intact, neurovascularly intact in all extremities with 2+ DTRs and 2+ pulses.  Lymph: No lymphadenopathy of posterior or anterior cervical chain or axillae bilaterally.  Gait normal with good balance and coordination.  MSK:  Non tender with full range of motion and good stability and symmetric strength and tone of  elbows, wrist, hip, knee and ankles bilaterally.  Shoulder: Left Inspection reveals no abnormalities, atrophy or asymmetry. Palpation is normal with no tenderness over AC joint or bicipital groove. ROM is full in all planes. Rotator cuff strength normal throughout. Positive impingement on the left side mild Speeds and Yergason's tests normal. No labral pathology noted with negative Obrien's, negative clunk and good stability. Mild scapular dyskinesis with a slipped rib noted. No painful arc and no drop arm sign. No apprehension sign Contralateral shoulder unremarkable  MSK US performed of: Left shoulder This study was ordered, performed, and interpreted by Charlann Boxer D.O.  Shoulder:   Supraspinatus:  Appears normal on long and transverse views, no bursal bulge seen with shoulder abduction on impingement view. Infraspinatus:  Appears normal on long and transverse views. Subscapularis:  Appears normal on long and transverse views. Teres Minor:  Appears normal on long and transverse views. AC joint:  Capsule undistended, no geyser  sign. Glenohumeral Joint:  Appears normal without effusion. Glenoid Labrum:  Intact without visualized tears. Biceps Tendon:  Appears normal on long and transverse views, no fraying of tendon, tendon located in intertubercular groove, no subluxation with shoulder internal or external rotation. No increased power doppler signal. Impression: Relatively normal  Osteopathic findings C3 flexed rotated and side bent left T3 extended rotated and side bent left inhaled third rib T7 extended rotated and side bent left L2 flexed rotated and side bent right Sacrum right on right   [id number]; 15 additional minutes spent for Therapeutic exercises as stated in above notes.  This included exercises focusing on stretching, strengthening, with significant focus on eccentric aspects.   Long term goals include an improvement in range of motion, strength, endurance as well as avoiding reinjury. Patient's frequency would include in 1-2 times a day, 3-5 times a week for a duration of 6-12  weeks.  Shoulder Exercises that included:  Basic scapular stabilization to include adduction and depression of scapula Scaption, focusing on proper movement and good control Internal and External rotation utilizing a theraband, with elbow tucked at side entire time Rows with theraband which was given  Proper technique shown and discussed handout in great detail with ATC.  All questions were discussed and answered.     Impression and Recommendations:     This case required medical decision making of moderate complexity.      Note: This dictation was prepared with Dragon dictation along with smaller phrase technology. Any transcriptional errors that result from this process are unintentional.

## 2017-08-05 ENCOUNTER — Encounter: Payer: Self-pay | Admitting: Family Medicine

## 2017-08-05 ENCOUNTER — Ambulatory Visit: Payer: Self-pay

## 2017-08-05 ENCOUNTER — Ambulatory Visit (INDEPENDENT_AMBULATORY_CARE_PROVIDER_SITE_OTHER): Payer: 59 | Admitting: Family Medicine

## 2017-08-05 VITALS — BP 124/88 | HR 75 | Ht 66.0 in | Wt 132.0 lb

## 2017-08-05 DIAGNOSIS — M25512 Pain in left shoulder: Secondary | ICD-10-CM | POA: Diagnosis not present

## 2017-08-05 DIAGNOSIS — M999 Biomechanical lesion, unspecified: Secondary | ICD-10-CM | POA: Insufficient documentation

## 2017-08-05 DIAGNOSIS — M940 Chondrocostal junction syndrome [Tietze]: Secondary | ICD-10-CM

## 2017-08-05 DIAGNOSIS — G8929 Other chronic pain: Secondary | ICD-10-CM

## 2017-08-05 NOTE — Assessment & Plan Note (Signed)
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  Home exercises for stabilization of the scapula given.  Topical anti-inflammatory trial given.  Do not feel that there is any further shoulder pathology that is likely contributing.  Follow-up again in 4 weeks

## 2017-08-05 NOTE — Patient Instructions (Signed)
Good  to see you  We will get you a new desk  Exercises 3 times a week.  Ice 20 minutes 2 times daily. Usually after activity and before bed. Keep hands within peripheral vision  pennsaid pinkie amount topically 2 times daily as needed.  Vitamin D 2000 IU daily  I hope the manipulation helps See me again in 4-5 weeks

## 2017-08-05 NOTE — Assessment & Plan Note (Signed)
Decision today to treat with OMT was based on Physical Exam  After verbal consent patient was treated with HVLA, ME, FPR techniques in cervical, thoracic, rib lumbar and sacral areas  Patient tolerated the procedure well with improvement in symptoms  Patient given exercises, stretches and lifestyle modifications  See medications in patient instructions if given  Patient will follow up in 4-6 weeks 

## 2017-08-20 DIAGNOSIS — H5213 Myopia, bilateral: Secondary | ICD-10-CM | POA: Diagnosis not present

## 2017-09-07 NOTE — Progress Notes (Signed)
[name] [place], [place] [address] Phone: [phone] Subjective:     CC: Left shoulder pain follow-up  PXT:[id number]  Adrienne Harris is a 42 y.o. female coming in with complaint of left shoulder pain. States that today it is slightly better. Still has pain.  Patient was found to have more of a slip procedure.  Feels that the manipulation was not extremely helpful.  Seems though that the pain seems to be more localized to the left shoulder that has been.  Rates the severity pain is 6 out of 10.  Waking her up at night     Past Medical History:  Diagnosis Date  . Atopic dermatitis   . Basal cell carcinoma    15 years ago.  Marland Kitchen Hemochromatosis    Heterozygous for two genes  . Palpitations    a. 04/2013 48h Holter: sinus rhythm/sinus arrhythmia, rare PAC's/PVC's.  . Shoulder pain, left 01/24/2015  . Shoulder pain, right 01/24/2015   Past Surgical History:  Procedure Laterality Date  . Excision of basal cell carcinoma    . WISDOM TOOTH EXTRACTION     Social History   Socioeconomic History  . Marital status: Married    Spouse name: Not on file  . Number of children: 1  . Years of education: Not on file  . Highest education level: Not on file  Occupational History  . Occupation: Microbiologist  Social Needs  . Financial resource strain: Not on file  . Food insecurity:    Worry: Not on file    Inability: Not on file  . Transportation needs:    Medical: Not on file    Non-medical: Not on file  Tobacco Use  . Smoking status: Never Smoker  . Smokeless tobacco: Never Used  Substance and Sexual Activity  . Alcohol use: Yes    Alcohol/week: 1.2 oz    Types: 2 Cans of beer per week    Comment: 1-2 beers a week  . Drug use: No  . Sexual activity: Yes    Partners: Male    Birth control/protection: Pill  Lifestyle  . Physical activity:    Days per week: 0 days    Minutes per session: 0 min  . Stress: Only a little  Relationships    . Social connections:    Talks on phone: Not on file    Gets together: Not on file    Attends religious service: Not on file    Active member of club or organization: Not on file    Attends meetings of clubs or organizations: Not on file    Relationship status: Not on file  Other Topics Concern  . Not on file  Social History Narrative   Married      Daughter -72 yrs.       Dietitian for [employer]   Allergies  Allergen Reactions  . Cortizone-10 [Hydrocortisone]     Allergic to all topical corticosteriods  . Propylene Glycol     Skin rash   Family History  Problem Relation Age of Onset  . Hyperlipidemia Maternal Grandfather   . Hypertension Maternal Grandfather   . Diabetes Maternal Grandfather   . Stroke Maternal Grandfather   . Hypertension Father   . Melanoma Father   . Hemochromatosis Mother   . Basal cell carcinoma Mother   . Hyperlipidemia Maternal Grandmother   . Hypertension Maternal Grandmother   . Osteoporosis Maternal Grandmother   . Breast cancer Paternal Aunt   .  Kidney cancer Paternal Aunt   . Alzheimer's disease Paternal Aunt   . Alzheimer's disease Paternal Grandmother   . Colon cancer Neg Hx      Past medical history, social, surgical and family history all reviewed in electronic medical record.  No pertanent information unless stated regarding to the chief complaint.   Review of Systems:Review of systems updated and as accurate as of 09/09/17  No headache, visual changes, nausea, vomiting, diarrhea, constipation, dizziness, abdominal pain, skin rash, fevers, chills, night sweats, weight loss, swollen lymph nodes, body aches, joint swelling, chest pain, shortness of breath, mood changes.  Mild positive muscle aches  Objective  Blood pressure 118/84, pulse 66, height 5\' 6"  (1.676 m), weight 131 lb (59.4 kg), last menstrual period 09/09/2017, SpO2 99 %. Systems examined below as of 09/09/17   General: No apparent distress alert and oriented x3 mood and  affect normal, dressed appropriately.  HEENT: Pupils equal, extraocular movements intact  Respiratory: Patient's speak in full sentences and does not appear short of breath  Cardiovascular: No lower extremity edema, non tender, no erythema  Skin: Warm dry intact with no signs of infection or rash on extremities or on axial skeleton.  Abdomen: Soft nontender  Neuro: Cranial nerves II through XII are intact, neurovascularly intact in all extremities with 2+ DTRs and 2+ pulses.  Lymph: No lymphadenopathy of posterior or anterior cervical chain or axillae bilaterally.  Gait normal with good balance and coordination.  MSK:  Non tender with full range of motion and good stability and symmetric strength and tone of  elbows, wrist, hip, knee and ankles bilaterally.  Shoulder: left Inspection reveals no abnormalities, atrophy or asymmetry. Palpation is normal with no tenderness over AC joint or bicipital groove. ROM is full in all planes passively. Rotator cuff strength normal throughout. signs of impingement with positive Neer and Hawkin's tests, but negative empty can sign. Speeds and Yergason's tests normal. No labral pathology noted with negative Obrien's, negative clunk and good stability. Normal scapular function observed. No painful arc and no drop arm sign. No apprehension sign    Procedure: Real-time Ultrasound Guided Injection of left glenohumeral joint Device: GE Logiq E  Ultrasound guided injection is preferred based studies that show increased duration, increased effect, greater accuracy, decreased procedural pain, increased response rate with ultrasound guided versus blind injection.  Verbal informed consent obtained.  Time-out conducted.  Noted no overlying erythema, induration, or other signs of local infection.  Skin prepped in a sterile fashion.  Local anesthesia: Topical Ethyl chloride.  With sterile technique and under real time ultrasound guidance:  Joint visualized.  23g  1  inch needle inserted posterior approach. Pictures taken for needle placement. Patient did have injection of 2 cc of 1% lidocaine, 2 cc of 0.5% Marcaine, and 1.0 cc of Kenalog 40 mg/dL. Completed without difficulty  Pain immediately resolved suggesting accurate placement of the medication.  Advised to call if fevers/chills, erythema, induration, drainage, or persistent bleeding.  Images permanently stored and available for review in the ultrasound unit.  Impression: Technically successful ultrasound guided injection.     Impression and Recommendations:     This case required medical decision making of moderate complexity.      Note: This dictation was prepared with Dragon dictation along with smaller phrase technology. Any transcriptional errors that result from this process are unintentional.

## 2017-09-09 ENCOUNTER — Ambulatory Visit: Payer: Self-pay

## 2017-09-09 ENCOUNTER — Ambulatory Visit (INDEPENDENT_AMBULATORY_CARE_PROVIDER_SITE_OTHER): Payer: 59 | Admitting: Family Medicine

## 2017-09-09 ENCOUNTER — Ambulatory Visit (INDEPENDENT_AMBULATORY_CARE_PROVIDER_SITE_OTHER)
Admission: RE | Admit: 2017-09-09 | Discharge: 2017-09-09 | Disposition: A | Discharge status: Home / Self Care | Payer: 59 | Source: Ambulatory Visit | Attending: Family Medicine | Admitting: Family Medicine

## 2017-09-09 ENCOUNTER — Encounter: Payer: Self-pay | Admitting: Family Medicine

## 2017-09-09 VITALS — BP 118/84 | HR 66 | Ht 66.0 in | Wt 131.0 lb

## 2017-09-09 DIAGNOSIS — G8929 Other chronic pain: Secondary | ICD-10-CM

## 2017-09-09 DIAGNOSIS — M25512 Pain in left shoulder: Secondary | ICD-10-CM

## 2017-09-09 DIAGNOSIS — M542 Cervicalgia: Secondary | ICD-10-CM | POA: Diagnosis not present

## 2017-09-09 NOTE — Patient Instructions (Signed)
Good to see you  Ice is your friend Keep hands within peripheral vision.  Injected the shoulder If not better in a week write me and I would like ot send you ot PT and then also get ultrasound of thyroid and maybe labs Xray of shoulder and neck today downstairs See me again in 4 weeks

## 2017-09-09 NOTE — Assessment & Plan Note (Signed)
Very mild bursitis.  Given injection.  X-rays of neck and shoulder ordered as well today with patient not responding to conservative therapy.  Patient has topical anti-inflammatories.  Discussed icing regimen and home exercises.  Patient declined formal physical therapy at the moment.  Follow-up again in 4 weeks

## 2017-09-16 ENCOUNTER — Other Ambulatory Visit: Payer: Self-pay | Admitting: Family

## 2017-09-16 DIAGNOSIS — I493 Ventricular premature depolarization: Secondary | ICD-10-CM

## 2017-09-16 NOTE — Telephone Encounter (Signed)
Refill request for 1 tid, patient reports taking 1 qd .

## 2017-09-16 NOTE — Telephone Encounter (Signed)
close

## 2017-09-25 ENCOUNTER — Encounter: Payer: Self-pay | Admitting: Family Medicine

## 2017-10-05 NOTE — Progress Notes (Signed)
[name] [place], [place] [address] Phone: [phone] Subjective:    I Kandace Blitz am serving as a Education administrator for Dr. Hulan Saas.    CC: Left shoulder pain follow-up  UUE:[id number]  Adrienne Harris is a 42 y.o. female coming in with complaint of left shoulder pain. States that her shoulder is doing slightly better. Used it a little more than usual this past weekend. Had injection last visit.  States minimal better overall.  Still having constant pain but is not stopping her from activities.     Past Medical History:  Diagnosis Date  . Atopic dermatitis   . Basal cell carcinoma    15 years ago.  Marland Kitchen Hemochromatosis    Heterozygous for two genes  . Palpitations    a. 04/2013 48h Holter: sinus rhythm/sinus arrhythmia, rare PAC's/PVC's.  . Shoulder pain, left 01/24/2015  . Shoulder pain, right 01/24/2015   Past Surgical History:  Procedure Laterality Date  . Excision of basal cell carcinoma    . WISDOM TOOTH EXTRACTION     Social History   Socioeconomic History  . Marital status: Married    Spouse name: Not on file  . Number of children: 1  . Years of education: Not on file  . Highest education level: Not on file  Occupational History  . Occupation: Microbiologist  Social Needs  . Financial resource strain: Not on file  . Food insecurity:    Worry: Not on file    Inability: Not on file  . Transportation needs:    Medical: Not on file    Non-medical: Not on file  Tobacco Use  . Smoking status: Never Smoker  . Smokeless tobacco: Never Used  Substance and Sexual Activity  . Alcohol use: Yes    Alcohol/week: 2.0 standard drinks    Types: 2 Cans of beer per week    Comment: 1-2 beers a week  . Drug use: No  . Sexual activity: Yes    Partners: Male    Birth control/protection: Pill  Lifestyle  . Physical activity:    Days per week: 0 days    Minutes per session: 0 min  . Stress: Only a little  Relationships  . Social  connections:    Talks on phone: Not on file    Gets together: Not on file    Attends religious service: Not on file    Active member of club or organization: Not on file    Attends meetings of clubs or organizations: Not on file    Relationship status: Not on file  Other Topics Concern  . Not on file  Social History Narrative   Married      Daughter -23 yrs.       Dietitian for [employer]   Allergies  Allergen Reactions  . Cortizone-10 [Hydrocortisone]     Allergic to all topical corticosteriods  . Propylene Glycol     Skin rash   Family History  Problem Relation Age of Onset  . Hyperlipidemia Maternal Grandfather   . Hypertension Maternal Grandfather   . Diabetes Maternal Grandfather   . Stroke Maternal Grandfather   . Hypertension Father   . Melanoma Father   . Hemochromatosis Mother   . Basal cell carcinoma Mother   . Hyperlipidemia Maternal Grandmother   . Hypertension Maternal Grandmother   . Osteoporosis Maternal Grandmother   . Breast cancer Paternal Aunt   . Kidney cancer Paternal Aunt   . Alzheimer's  disease Paternal Aunt   . Alzheimer's disease Paternal Grandmother   . Colon cancer Neg Hx      Past medical history, social, surgical and family history all reviewed in electronic medical record.  No pertanent information unless stated regarding to the chief complaint.   Review of Systems:Review of systems updated and as accurate as of   No headache, visual changes, nausea, vomiting, diarrhea, constipation, dizziness, abdominal pain, skin rash, fevers, chills, night sweats, weight loss, swollen lymph nodes, body aches, joint swelling,  chest pain, shortness of breath, mood changes.  Positive muscle aches  Objective  Blood pressure 120/82, pulse 68, height 5\' 6"  (1.676 m), weight 131 lb (59.4 kg), last menstrual period 09/09/2017, SpO2 95 %. Systems examined below as of    General: No apparent distress alert and oriented x3 mood and affect normal, dressed  appropriately.  HEENT: Pupils equal, extraocular movements intact  Respiratory: Patient's speak in full sentences and does not appear short of breath  Cardiovascular: No lower extremity edema, non tender, no erythema  Skin: Warm dry intact with no signs of infection or rash on extremities or on axial skeleton.  Abdomen: Soft nontender  Neuro: Cranial nerves II through XII are intact, neurovascularly intact in all extremities with 2+ DTRs and 2+ pulses.  Lymph: No lymphadenopathy of posterior or anterior cervical chain or axillae bilaterally.  Gait normal with good balance and coordination.  MSK:  Non tender with full range of motion and good stability and symmetric strength and tone of  elbows, wrist, hip, knee and ankles bilaterally.  Neck: Inspection unremarkable. No palpable stepoffs. Negative Spurling's maneuver. Full neck range of motion Grip strength and sensation normal in bilateral hands Strength good C4 to T1 distribution No sensory change to C4 to T1 Negative Hoffman sign bilaterally Reflexes normal  Shoulder: Left Inspection reveals no abnormalities, atrophy or asymmetry. Palpation is normal with no tenderness over AC joint or bicipital groove. ROM is full in all planes. Rotator cuff strength normal throughout. Positive impingement Speeds and Yergason's tests normal. No labral pathology noted with negative Obrien's, negative clunk and good stability. Scapular dyskinesis noted No apprehension sign Severe tightness of the left trapezius with multiple trigger points  After verbal consent patient was prepped with alcohol swabs and with a 25-gauge 1 inch needle was injected into 4 distinct trigger points in the left shoulder region.  Total of 3 cc of 0.5% Marcaine and 1 cc of Kenalog 40 mg/mL used     Impression and Recommendations:     This case required medical decision making of moderate complexity.  The above documentation has been reviewed and is accurate and  complete @Zachary  M Smith@      Note: This dictation was prepared with Dragon dictation along with smaller phrase technology. Any transcriptional errors that result from this process are unintentional.

## 2017-10-06 ENCOUNTER — Ambulatory Visit (INDEPENDENT_AMBULATORY_CARE_PROVIDER_SITE_OTHER): Payer: 59 | Admitting: Family Medicine

## 2017-10-06 ENCOUNTER — Encounter: Payer: Self-pay | Admitting: Family Medicine

## 2017-10-06 DIAGNOSIS — M25512 Pain in left shoulder: Secondary | ICD-10-CM | POA: Insufficient documentation

## 2017-10-06 MED ORDER — GABAPENTIN 100 MG PO CAPS: 200.0000 mg | ORAL_CAPSULE | Freq: Every day | ORAL | 3 refills | Status: DC

## 2017-10-06 NOTE — Assessment & Plan Note (Signed)
Trigger point injections given.  Discussed icing regimen and home exercises.  Discussed which activities of doing which wants to avoid.  Patient did not respond well to manipulation or the shoulder injection.  Encourage patient to consider formal physical therapy.  We discussed icing regimen.  Follow-up again in 6 to 8 weeks

## 2017-10-06 NOTE — Patient Instructions (Addendum)
Good to see you  Ice is your friend Ice 20 minutes 2 times daily. Usually after activity and before bed. We tried trigger point injections today and really hope it helps Gabapentin 100-200mg  at night could help calm down nerve Really work on doing the exercises regularly  Keep hands within peripheral vision  See me again in 6 weeks

## 2017-11-16 NOTE — Progress Notes (Signed)
[facility] [place], [place] [address] Phone: [phone] Subjective:   [name], am serving as a scribe for Dr. Hulan Saas.   CC: Neck and left shoulder pain follow-up  UYQ:[id number]  Adrienne Harris is a 42 y.o. female coming in with complaint of left shoulder pain. Pain near the end of the day. Pain is in left trap. Did have some relief from the injection.  Patient was given trigger point injections at last follow-up.  Feels like making significant improvement at the moment.  States about 70% better. Still tightness on a daily basis.      Past Medical History:  Diagnosis Date  . Atopic dermatitis   . Basal cell carcinoma    15 years ago.  Marland Kitchen Hemochromatosis    Heterozygous for two genes  . Palpitations    a. 04/2013 48h Holter: sinus rhythm/sinus arrhythmia, rare PAC's/PVC's.  . Shoulder pain, left 01/24/2015  . Shoulder pain, right 01/24/2015   Past Surgical History:  Procedure Laterality Date  . Excision of basal cell carcinoma    . WISDOM TOOTH EXTRACTION     Social History   Socioeconomic History  . Marital status: Married    Spouse name: Not on file  . Number of children: 1  . Years of education: Not on file  . Highest education level: Not on file  Occupational History  . Occupation: Microbiologist  Social Needs  . Financial resource strain: Not on file  . Food insecurity:    Worry: Not on file    Inability: Not on file  . Transportation needs:    Medical: Not on file    Non-medical: Not on file  Tobacco Use  . Smoking status: Never Smoker  . Smokeless tobacco: Never Used  Substance and Sexual Activity  . Alcohol use: Yes    Alcohol/week: 2.0 standard drinks    Types: 2 Cans of beer per week    Comment: 1-2 beers a week  . Drug use: No  . Sexual activity: Yes    Partners: Male    Birth control/protection: Pill  Lifestyle  . Physical activity:    Days per week: 0 days    Minutes per session: 0 min    . Stress: Only a little  Relationships  . Social connections:    Talks on phone: Not on file    Gets together: Not on file    Attends religious service: Not on file    Active member of club or organization: Not on file    Attends meetings of clubs or organizations: Not on file    Relationship status: Not on file  Other Topics Concern  . Not on file  Social History Narrative   Married      Daughter -46 yrs.       Dietitian for [employer]   Allergies  Allergen Reactions  . Cortizone-10 [Hydrocortisone]     Allergic to all topical corticosteriods  . Propylene Glycol     Skin rash   Family History  Problem Relation Age of Onset  . Hyperlipidemia Maternal Grandfather   . Hypertension Maternal Grandfather   . Diabetes Maternal Grandfather   . Stroke Maternal Grandfather   . Hypertension Father   . Melanoma Father   . Hemochromatosis Mother   . Basal cell carcinoma Mother   . Hyperlipidemia Maternal Grandmother   . Hypertension Maternal Grandmother   . Osteoporosis Maternal Grandmother   . Breast cancer Paternal  Aunt   . Kidney cancer Paternal Aunt   . Alzheimer's disease Paternal Aunt   . Alzheimer's disease Paternal Grandmother   . Colon cancer Neg Hx     Current Outpatient Medications (Endocrine & Metabolic):  .  levonorgestrel-ethinyl estradiol (NORDETTE) 0.15-30 MG-MCG tablet, Take 1 tablet by mouth daily.  Current Outpatient Medications (Cardiovascular):  .  propranolol (INDERAL) 20 MG tablet, Take 1 tablet (20 mg total) by mouth daily as needed.   Current Outpatient Medications (Analgesics):  .  ibuprofen (ADVIL,MOTRIN) 200 MG tablet, Take 200 mg by mouth every 6 (six) hours as needed.   Current Outpatient Medications (Other):  Marland Kitchen  ACZONE 7.5 % GEL,  .  cholecalciferol (VITAMIN D) 1000 units tablet, Take 1,000 Units by mouth 2 (two) times daily. .  diclofenac sodium (VOLTAREN) 1 % GEL, Apply 4 g topically 4 (four) times daily. Marland Kitchen  gabapentin (NEURONTIN) 100 MG  capsule, Take 2 capsules (200 mg total) by mouth at bedtime.    Past medical history, social, surgical and family history all reviewed in electronic medical record.  No pertanent information unless stated regarding to the chief complaint.   Review of Systems:  No headache, visual changes, nausea, vomiting, diarrhea, constipation, dizziness, abdominal pain, skin rash, fevers, chills, night sweats, weight loss, swollen lymph nodes, body aches, joint swelling, muscle aches, chest pain, shortness of breath, mood changes.   Objective  Blood pressure 122/82, pulse 91, height 5\' 6"  (1.676 m), weight 131 lb (59.4 kg), SpO2 98 %.    General: No apparent distress alert and oriented x3 mood and affect normal, dressed appropriately.  HEENT: Pupils equal, extraocular movements intact  Respiratory: Patient's speak in full sentences and does not appear short of breath  Cardiovascular: No lower extremity edema, non tender, no erythema  Skin: Warm dry intact with no signs of infection or rash on extremities or on axial skeleton.  Abdomen: Soft nontender  Neuro: Cranial nerves II through XII are intact, neurovascularly intact in all extremities with 2+ DTRs and 2+ pulses.  Lymph: No lymphadenopathy of posterior or anterior cervical chain or axillae bilaterally.  Gait normal with good balance and coordination.  MSK:  Non tender with full range of motion and good stability and symmetric strength and tone of shoulders, elbows, wrist, hip, knee and ankles bilaterally.  Neck: Inspection mild loss of lordosis. No palpable stepoffs. Negative Spurling's maneuver. Limited range of motion lacking last 10 degrees of extension Grip strength and sensation normal in bilateral hands Strength good C4 to T1 distribution No sensory change to C4 to T1 Negative Hoffman sign bilaterally Reflexes normal Tightness of the left trapezius still noted as well as patient also has some very mild scapular dyskinesis on the  left  Osteopathic findingst  C4 flexed rotated and side bent left C7 flexed rotated and side bent left T3 extended rotated and side bent left inhaled third rib T9 extended rotated and side bent right  L3 flexed rotated and side bent left Sacrum right on right    Impression and Recommendations:     This case required medical decision making of moderate complexity. The above documentation has been reviewed and is accurate and complete Lyndal Pulley, DO       Note: This dictation was prepared with Dragon dictation along with smaller phrase technology. Any transcriptional errors that result from this process are unintentional.

## 2017-11-18 ENCOUNTER — Encounter: Payer: Self-pay | Admitting: Family Medicine

## 2017-11-18 ENCOUNTER — Ambulatory Visit (INDEPENDENT_AMBULATORY_CARE_PROVIDER_SITE_OTHER): Payer: 59 | Admitting: Family Medicine

## 2017-11-18 VITALS — BP 122/82 | HR 91 | Ht 66.0 in | Wt 131.0 lb

## 2017-11-18 DIAGNOSIS — M9901 Segmental and somatic dysfunction of cervical region: Secondary | ICD-10-CM

## 2017-11-18 DIAGNOSIS — M9908 Segmental and somatic dysfunction of rib cage: Secondary | ICD-10-CM | POA: Diagnosis not present

## 2017-11-18 DIAGNOSIS — M9904 Segmental and somatic dysfunction of sacral region: Secondary | ICD-10-CM

## 2017-11-18 DIAGNOSIS — M999 Biomechanical lesion, unspecified: Secondary | ICD-10-CM | POA: Diagnosis not present

## 2017-11-18 DIAGNOSIS — M9902 Segmental and somatic dysfunction of thoracic region: Secondary | ICD-10-CM | POA: Diagnosis not present

## 2017-11-18 DIAGNOSIS — M9903 Segmental and somatic dysfunction of lumbar region: Secondary | ICD-10-CM

## 2017-11-18 DIAGNOSIS — G8929 Other chronic pain: Secondary | ICD-10-CM

## 2017-11-18 DIAGNOSIS — M25512 Pain in left shoulder: Secondary | ICD-10-CM | POA: Diagnosis not present

## 2017-11-18 NOTE — Patient Instructions (Signed)
Good to see you  Ice is your friend Gabapentin 200mg  at night as needed Keep up the exercises  Change the hands on the elliptical  See me again in 6 weeks

## 2017-11-18 NOTE — Assessment & Plan Note (Signed)
Decision today to treat with OMT was based on Physical Exam  After verbal consent patient was treated with HVLA, ME, FPR techniques in cervical, thoracic, rib lumbar and sacral areas  Patient tolerated the procedure well with improvement in symptoms  Patient given exercises, stretches and lifestyle modifications  See medications in patient instructions if given  Patient will follow up in 6 weeks 

## 2017-11-18 NOTE — Assessment & Plan Note (Signed)
Still believe more musculoskeletal.  I do believe the scapular dyskinesis, home exercises are likely going to be more beneficial.  Discussed which activities of doing which wants to avoid.  Increase activity as tolerated.  Patient will do more the stability.  We discussed ergonomics throughout the day.  Restarted osteopathic manipulation with hopefully helpful.  Follow-up again in 6 weeks

## 2017-12-03 DIAGNOSIS — D2271 Melanocytic nevi of right lower limb, including hip: Secondary | ICD-10-CM | POA: Diagnosis not present

## 2017-12-03 DIAGNOSIS — D2261 Melanocytic nevi of right upper limb, including shoulder: Secondary | ICD-10-CM | POA: Diagnosis not present

## 2017-12-03 DIAGNOSIS — D2272 Melanocytic nevi of left lower limb, including hip: Secondary | ICD-10-CM | POA: Diagnosis not present

## 2017-12-03 DIAGNOSIS — D2262 Melanocytic nevi of left upper limb, including shoulder: Secondary | ICD-10-CM | POA: Diagnosis not present

## 2017-12-03 DIAGNOSIS — L700 Acne vulgaris: Secondary | ICD-10-CM | POA: Diagnosis not present

## 2017-12-21 DIAGNOSIS — H40003 Preglaucoma, unspecified, bilateral: Secondary | ICD-10-CM | POA: Diagnosis not present

## 2017-12-29 NOTE — Progress Notes (Signed)
[name] [address] [place], [place] [address] Phone: [phone] Subjective:    I'm seeing this patient by the request  of:    CC: Left shoulder and back pain  [id number]  Adrienne Harris is a 42 y.o. female coming in with complaint of left shoulder and thoracic spine pain. Has not been using gabapentin. Does do the exercises 2-3 times a week. Is feeling a bit better.  Patient has been making some improvement.  No radicular symptoms.  Pain does not stop her from any significant activity at the moment.    Past Medical History:  Diagnosis Date  . Atopic dermatitis   . Basal cell carcinoma    15 years ago.  Marland Kitchen Hemochromatosis    Heterozygous for two genes  . Palpitations    a. 04/2013 48h Holter: sinus rhythm/sinus arrhythmia, rare PAC's/PVC's.  . Shoulder pain, left 01/24/2015  . Shoulder pain, right 01/24/2015   Past Surgical History:  Procedure Laterality Date  . Excision of basal cell carcinoma    . WISDOM TOOTH EXTRACTION     Social History   Socioeconomic History  . Marital status: Married    Spouse name: Not on file  . Number of children: 1  . Years of education: Not on file  . Highest education level: Not on file  Occupational History  . Occupation: Microbiologist  Social Needs  . Financial resource strain: Not on file  . Food insecurity:    Worry: Not on file    Inability: Not on file  . Transportation needs:    Medical: Not on file    Non-medical: Not on file  Tobacco Use  . Smoking status: Never Smoker  . Smokeless tobacco: Never Used  Substance and Sexual Activity  . Alcohol use: Yes    Alcohol/week: 2.0 standard drinks    Types: 2 Cans of beer per week    Comment: 1-2 beers a week  . Drug use: No  . Sexual activity: Yes    Partners: Male    Birth control/protection: Pill  Lifestyle  . Physical activity:    Days per week: 0 days    Minutes per session: 0 min  . Stress: Only a little  Relationships  . Social  connections:    Talks on phone: Not on file    Gets together: Not on file    Attends religious service: Not on file    Active member of club or organization: Not on file    Attends meetings of clubs or organizations: Not on file    Relationship status: Not on file  Other Topics Concern  . Not on file  Social History Narrative   Married      Daughter -27 yrs.       Dietitian for [employer]   Allergies  Allergen Reactions  . Cortizone-10 [Hydrocortisone]     Allergic to all topical corticosteriods  . Propylene Glycol     Skin rash   Family History  Problem Relation Age of Onset  . Hyperlipidemia Maternal Grandfather   . Hypertension Maternal Grandfather   . Diabetes Maternal Grandfather   . Stroke Maternal Grandfather   . Hypertension Father   . Melanoma Father   . Hemochromatosis Mother   . Basal cell carcinoma Mother   . Hyperlipidemia Maternal Grandmother   . Hypertension Maternal Grandmother   . Osteoporosis Maternal Grandmother   . Breast cancer Paternal Aunt   . Kidney cancer Paternal Aunt   .  Alzheimer's disease Paternal Aunt   . Alzheimer's disease Paternal Grandmother   . Colon cancer Neg Hx     Current Outpatient Medications (Endocrine & Metabolic):  .  levonorgestrel-ethinyl estradiol (NORDETTE) 0.15-30 MG-MCG tablet, Take 1 tablet by mouth daily.  Current Outpatient Medications (Cardiovascular):  .  propranolol (INDERAL) 20 MG tablet, Take 1 tablet (20 mg total) by mouth daily as needed.   Current Outpatient Medications (Analgesics):  .  ibuprofen (ADVIL,MOTRIN) 200 MG tablet, Take 200 mg by mouth every 6 (six) hours as needed.   Current Outpatient Medications (Other):  Marland Kitchen  ACZONE 7.5 % GEL,  .  cholecalciferol (VITAMIN D) 1000 units tablet, Take 1,000 Units by mouth 2 (two) times daily. .  diclofenac sodium (VOLTAREN) 1 % GEL, Apply 4 g topically 4 (four) times daily. Marland Kitchen  gabapentin (NEURONTIN) 100 MG capsule, Take 2 capsules (200 mg total) by mouth at  bedtime.    Past medical history, social, surgical and family history all reviewed in electronic medical record.  No pertanent information unless stated regarding to the chief complaint.   Review of Systems:  No headache, visual changes, nausea, vomiting, diarrhea, constipation, dizziness, abdominal pain, skin rash, fevers, chills, night sweats, weight loss, swollen lymph nodes, body aches, joint swelling, , chest pain, shortness of breath, mood changes.  Positive muscle aches  Objective  Blood pressure 126/84, pulse 95, height 5\' 6"  (1.676 m), weight 131 lb (59.4 kg), SpO2 99 %.    General: No apparent distress alert and oriented x3 mood and affect normal, dressed appropriately.  HEENT: Pupils equal, extraocular movements intact  Respiratory: Patient's speak in full sentences and does not appear short of breath  Cardiovascular: No lower extremity edema, non tender, no erythema  Skin: Warm dry intact with no signs of infection or rash on extremities or on axial skeleton.  Abdomen: Soft nontender  Neuro: Cranial nerves II through XII are intact, neurovascularly intact in all extremities with 2+ DTRs and 2+ pulses.  Lymph: No lymphadenopathy of posterior or anterior cervical chain or axillae bilaterally.  Gait normal with good balance and coordination.  MSK:  Non tender with full range of motion and good stability and symmetric strength and tone of shoulders, elbows, wrist, hip, knee and ankles bilaterally.  Neck: Inspection mild loss of lordosis. No palpable stepoffs. Negative Spurling's maneuver. Limited extension he does have some mild loss of sidebending bilaterally Grip strength and sensation normal in bilateral hands Strength good C4 to T1 distribution No sensory change to C4 to T1 Negative Hoffman sign bilaterally Reflexes normal  Tightness of the trapezius  Osteopathic findings C2 flexed rotated and side bent right C6 flexed rotated and side bent left T3 extended rotated  and side bent left inhaled third rib T9 extended rotated and side bent left L2 flexed rotated and side bent right Sacrum left on left    Impression and Recommendations:     This case required medical decision making of moderate complexity. The above documentation has been reviewed and is accurate and complete Lyndal Pulley, DO       Note: This dictation was prepared with Dragon dictation along with smaller phrase technology. Any transcriptional errors that result from this process are unintentional.

## 2017-12-30 ENCOUNTER — Ambulatory Visit (INDEPENDENT_AMBULATORY_CARE_PROVIDER_SITE_OTHER): Payer: 59 | Admitting: Family Medicine

## 2017-12-30 ENCOUNTER — Encounter: Payer: Self-pay | Admitting: Family Medicine

## 2017-12-30 VITALS — BP 126/84 | HR 95 | Ht 66.0 in | Wt 131.0 lb

## 2017-12-30 DIAGNOSIS — M9908 Segmental and somatic dysfunction of rib cage: Secondary | ICD-10-CM | POA: Diagnosis not present

## 2017-12-30 DIAGNOSIS — M9901 Segmental and somatic dysfunction of cervical region: Secondary | ICD-10-CM | POA: Diagnosis not present

## 2017-12-30 DIAGNOSIS — M9902 Segmental and somatic dysfunction of thoracic region: Secondary | ICD-10-CM | POA: Diagnosis not present

## 2017-12-30 DIAGNOSIS — M9904 Segmental and somatic dysfunction of sacral region: Secondary | ICD-10-CM

## 2017-12-30 DIAGNOSIS — M9903 Segmental and somatic dysfunction of lumbar region: Secondary | ICD-10-CM

## 2017-12-30 DIAGNOSIS — M999 Biomechanical lesion, unspecified: Secondary | ICD-10-CM | POA: Diagnosis not present

## 2017-12-30 DIAGNOSIS — M940 Chondrocostal junction syndrome [Tietze]: Secondary | ICD-10-CM | POA: Diagnosis not present

## 2017-12-30 NOTE — Assessment & Plan Note (Signed)
Decision today to treat with OMT was based on Physical Exam  After verbal consent patient was treated with HVLA, ME, FPR techniques in cervical, thoracic, rib, lumbar and sacral areas  Patient tolerated the procedure well with improvement in symptoms  Patient given exercises, stretches and lifestyle modifications  See medications in patient instructions if given  Patient will follow up in 8 weeks 

## 2017-12-30 NOTE — Patient Instructions (Addendum)
Good to see you  Ice is your friend Stay active Firm memory foam non contour pillow  See me again in 7-8 weeks

## 2017-12-30 NOTE — Assessment & Plan Note (Signed)
Doing relatively well.  Still more of a chronic condition.  Responding well to manipulation and home exercise, discussed icing regimen.  Discussed which activities to do which wants to avoid.  Follow-up again in 8 weeks

## 2018-01-21 ENCOUNTER — Other Ambulatory Visit: Payer: Self-pay

## 2018-01-21 ENCOUNTER — Telehealth: Payer: Self-pay | Admitting: *Deleted

## 2018-01-21 ENCOUNTER — Inpatient Hospital Stay: Payer: 59 | Attending: Oncology

## 2018-01-21 DIAGNOSIS — Z85828 Personal history of other malignant neoplasm of skin: Secondary | ICD-10-CM | POA: Diagnosis not present

## 2018-01-21 DIAGNOSIS — Z793 Long term (current) use of hormonal contraceptives: Secondary | ICD-10-CM | POA: Insufficient documentation

## 2018-01-21 DIAGNOSIS — Z79899 Other long term (current) drug therapy: Secondary | ICD-10-CM | POA: Insufficient documentation

## 2018-01-21 LAB — IRON AND TIBC
Iron: 176 ug/dL — ABNORMAL HIGH (ref 28–170)
SATURATION RATIOS: 48 % — AB (ref 10.4–31.8)
TIBC: 371 ug/dL (ref 250–450)
UIBC: 195 ug/dL

## 2018-01-21 LAB — COMPREHENSIVE METABOLIC PANEL
ALK PHOS: 41 U/L (ref 38–126)
ALT: 13 U/L (ref 0–44)
AST: 20 U/L (ref 15–41)
Albumin: 3.8 g/dL (ref 3.5–5.0)
Anion gap: 5 (ref 5–15)
BUN: 12 mg/dL (ref 6–20)
CALCIUM: 8.8 mg/dL — AB (ref 8.9–10.3)
CO2: 26 mmol/L (ref 22–32)
Chloride: 108 mmol/L (ref 98–111)
Creatinine, Ser: 0.61 mg/dL (ref 0.44–1.00)
GFR calc Af Amer: >60 mL/min (ref >60)
GLUCOSE: 102 mg/dL — AB (ref 70–99)
POTASSIUM: 3.3 mmol/L — AB (ref 3.5–5.1)
SODIUM: 139 mmol/L (ref 135–145)
Total Bilirubin: 0.5 mg/dL (ref 0.3–1.2)
Total Protein: 6.7 g/dL (ref 6.5–8.1)

## 2018-01-21 LAB — CBC WITH DIFFERENTIAL/PLATELET
ABS IMMATURE GRANULOCYTES: 0.03 10*3/uL (ref 0.00–0.07)
BASOS PCT: 1 %
Basophils Absolute: 0 10*3/uL (ref 0.0–0.1)
EOS PCT: 9 %
Eosinophils Absolute: 0.5 10*3/uL (ref 0.0–0.5)
HCT: 42.9 % (ref 36.0–46.0)
HEMOGLOBIN: 14.3 g/dL (ref 12.0–15.0)
Immature Granulocytes: 1 %
LYMPHS PCT: 30 %
Lymphs Abs: 1.7 10*3/uL (ref 0.7–4.0)
MCH: 32.1 pg (ref 26.0–34.0)
MCHC: 33.3 g/dL (ref 30.0–36.0)
MCV: 96.4 fL (ref 80.0–100.0)
MONOS PCT: 8 %
Monocytes Absolute: 0.4 10*3/uL (ref 0.1–1.0)
NEUTROS ABS: 3 10*3/uL (ref 1.7–7.7)
NRBC: 0 % (ref 0.0–0.2)
Neutrophils Relative %: 51 %
PLATELETS: 268 10*3/uL (ref 150–400)
RBC: 4.45 MIL/uL (ref 3.87–5.11)
RDW: 11.8 % (ref 11.5–15.5)
WBC: 5.7 10*3/uL (ref 4.0–10.5)

## 2018-01-21 LAB — FERRITIN: Ferritin: 35 ng/mL (ref 11–307)

## 2018-01-21 NOTE — Telephone Encounter (Signed)
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  I did go over her current results from today. her potassium was 3.4.  Dr. Janese Banks wanted her to eat like an extra banana or baked potato orange juice oranges to make it come out the 10th of a point.  Patient states that she will eat something that has more potassium in it to make it come up the 10th of a point.  Patient has a scheduled for next week to see [place].

## 2018-01-22 ENCOUNTER — Other Ambulatory Visit: Payer: 59

## 2018-01-28 ENCOUNTER — Encounter: Payer: Self-pay | Admitting: Oncology

## 2018-01-28 ENCOUNTER — Inpatient Hospital Stay (HOSPITAL_BASED_OUTPATIENT_CLINIC_OR_DEPARTMENT_OTHER): Payer: 59 | Admitting: Oncology

## 2018-01-28 DIAGNOSIS — Z85828 Personal history of other malignant neoplasm of skin: Secondary | ICD-10-CM | POA: Diagnosis not present

## 2018-01-28 DIAGNOSIS — Z79899 Other long term (current) drug therapy: Secondary | ICD-10-CM

## 2018-01-28 DIAGNOSIS — Z793 Long term (current) use of hormonal contraceptives: Secondary | ICD-10-CM | POA: Diagnosis not present

## 2018-01-28 NOTE — Progress Notes (Signed)
Hematology/Oncology Consult note [facility]  Telephone:([phone] [phone]  Patient Care Team: [name], FNP as PCP - General (Family Medicine)   Name of the patient: Adrienne Harris  [id number]  06/03/1975   Date of visit: 01/28/18  Diagnosis- compound heterozygous hereditary hemochromatosis C282Y and H63D)   Chief complaint/ Reason for visit-routine follow-up of hemochromatosis  Heme/Onc history:  Patient was diagnosed with hemochromatosis back in 2009.  She required multiple phlebotomies back then but has not required any since 2015.  Patient also had a CT abdomen in April 2018 which showed hepatic cyst but also showed a peripherally enhancing lesion in the right hepatic lobe for which MRI was recommended.  Family history significant for her mother who also has hereditary hemochromatosis   Interval history-overall she feels well.  Appetite is good and her weight is stable.  She reports some problems with left shoulder pain for which she is seeing neurology.  ECOG PS- 0 Pain scale- 0 Opioid associated constipation- no  Review of systems- Review of Systems  Constitutional: Negative for chills, fever, malaise/fatigue and weight loss.  HENT: Negative for congestion, ear discharge and nosebleeds.   Eyes: Negative for blurred vision.  Respiratory: Negative for cough, hemoptysis, sputum production, shortness of breath and wheezing.   Cardiovascular: Negative for chest pain, palpitations, orthopnea and claudication.  Gastrointestinal: Negative for abdominal pain, blood in stool, constipation, diarrhea, heartburn, melena, nausea and vomiting.  Genitourinary: Negative for dysuria, flank pain, frequency, hematuria and urgency.  Musculoskeletal: Positive for joint pain. Negative for back pain and myalgias.  Skin: Negative for rash.  Neurological: Negative for dizziness, tingling, focal weakness, seizures, weakness and headaches.    Endo/Heme/Allergies: Does not bruise/bleed easily.  Psychiatric/Behavioral: Negative for depression and suicidal ideas. The patient does not have insomnia.       Allergies  Allergen Reactions  . Cortizone-10 [Hydrocortisone]     Allergic to all topical corticosteriods  . Propylene Glycol     Skin rash     Past Medical History:  Diagnosis Date  . Atopic dermatitis   . Basal cell carcinoma    15 years ago.  Marland Kitchen Hemochromatosis    Heterozygous for two genes  . Palpitations    a. 04/2013 48h Holter: sinus rhythm/sinus arrhythmia, rare PAC's/PVC's.  . Shoulder pain, left 01/24/2015  . Shoulder pain, right 01/24/2015     Past Surgical History:  Procedure Laterality Date  . Excision of basal cell carcinoma    . WISDOM TOOTH EXTRACTION      Social History   Socioeconomic History  . Marital status: Married    Spouse name: Not on file  . Number of children: 1  . Years of education: Not on file  . Highest education level: Not on file  Occupational History  . Occupation: Microbiologist  Social Needs  . Financial resource strain: Not on file  . Food insecurity:    Worry: Not on file    Inability: Not on file  . Transportation needs:    Medical: Not on file    Non-medical: Not on file  Tobacco Use  . Smoking status: Never Smoker  . Smokeless tobacco: Never Used  Substance and Sexual Activity  . Alcohol use: Yes    Alcohol/week: 2.0 standard drinks    Types: 2 Cans of beer per week    Comment: 1-2 beers a week  . Drug use: No  . Sexual activity: Yes    Partners: Male  Birth control/protection: Pill  Lifestyle  . Physical activity:    Days per week: 0 days    Minutes per session: 0 min  . Stress: Only a little  Relationships  . Social connections:    Talks on phone: Not on file    Gets together: Not on file    Attends religious service: Not on file    Active member of club or organization: Not on file    Attends meetings of clubs or organizations: Not on file     Relationship status: Not on file  . Intimate partner violence:    Fear of current or ex partner: Not on file    Emotionally abused: Not on file    Physically abused: Not on file    Forced sexual activity: Not on file  Other Topics Concern  . Not on file  Social History Narrative   Married      Daughter -6 yrs.       Dietitian for [employer]    Family History  Problem Relation Age of Onset  . Hyperlipidemia Maternal Grandfather   . Hypertension Maternal Grandfather   . Diabetes Maternal Grandfather   . Stroke Maternal Grandfather   . Hypertension Father   . Melanoma Father   . Hemochromatosis Mother   . Basal cell carcinoma Mother   . Hyperlipidemia Maternal Grandmother   . Hypertension Maternal Grandmother   . Osteoporosis Maternal Grandmother   . Breast cancer Paternal Aunt   . Kidney cancer Paternal Aunt   . Alzheimer's disease Paternal Aunt   . Alzheimer's disease Paternal Grandmother   . Colon cancer Neg Hx      Current Outpatient Medications:  .  cholecalciferol (VITAMIN D) 1000 units tablet, Take 1,000 Units by mouth 2 (two) times daily., Disp: , Rfl:  .  levonorgestrel-ethinyl estradiol (NORDETTE) 0.15-30 MG-MCG tablet, Take 1 tablet by mouth daily., Disp: 3 Package, Rfl: 3 .  ACZONE 7.5 % GEL, , Disp: , Rfl: 2 .  diclofenac sodium (VOLTAREN) 1 % GEL, Apply 4 g topically 4 (four) times daily. (Patient not taking: Reported on 01/28/2018), Disp: 1 Tube, Rfl: 3 .  gabapentin (NEURONTIN) 100 MG capsule, Take 2 capsules (200 mg total) by mouth at bedtime. (Patient not taking: Reported on 01/28/2018), Disp: 60 capsule, Rfl: 3 .  ibuprofen (ADVIL,MOTRIN) 200 MG tablet, Take 200 mg by mouth every 6 (six) hours as needed., Disp: , Rfl:  .  propranolol (INDERAL) 20 MG tablet, Take 1 tablet (20 mg total) by mouth daily as needed. (Patient not taking: Reported on 01/28/2018), Disp: 90 tablet, Rfl: 1  Physical exam:  Vitals:   01/28/18 0830  BP: (!) 156/88  Pulse: (!) 102   Resp: 18  Temp: 98.4 F (36.9 C)  TempSrc: Tympanic  SpO2: 99%  Weight: 131 lb 6.4 oz (59.6 kg)  Height: 5\' 6"  (1.676 m)   Physical Exam HENT:     Head: Normocephalic and atraumatic.  Eyes:     Pupils: Pupils are equal, round, and reactive to light.  Neck:     Musculoskeletal: Normal range of motion.  Cardiovascular:     Rate and Rhythm: Normal rate and regular rhythm.     Heart sounds: Normal heart sounds.  Pulmonary:     Effort: Pulmonary effort is normal.     Breath sounds: Normal breath sounds.  Abdominal:     General: Bowel sounds are normal. There is no distension.     Palpations: Abdomen is soft.  Tenderness: There is no abdominal tenderness.  Musculoskeletal:        General: No swelling.  Skin:    General: Skin is warm and dry.  Neurological:     Mental Status: She is alert and oriented to person, place, and time.      CMP Latest Ref Rng & Units 01/21/2018  Glucose 70 - 99 mg/dL 102(H)  BUN 6 - 20 mg/dL 12  Creatinine 0.44 - 1.00 mg/dL 0.61  Sodium 135 - 145 mmol/L 139  Potassium 3.5 - 5.1 mmol/L 3.3(L)  Chloride 98 - 111 mmol/L 108  CO2 22 - 32 mmol/L 26  Calcium 8.9 - 10.3 mg/dL 8.8(L)  Total Protein 6.5 - 8.1 g/dL 6.7  Total Bilirubin 0.3 - 1.2 mg/dL 0.5  Alkaline Phos 38 - 126 U/L 41  AST 15 - 41 U/L 20  ALT 0 - 44 U/L 13   CBC Latest Ref Rng & Units 01/21/2018  WBC 4.0 - 10.5 K/uL 5.7  Hemoglobin 12.0 - 15.0 g/dL 14.3  Hematocrit 36.0 - 46.0 % 42.9  Platelets 150 - 400 K/uL 268    No images are attached to the encounter.  No results found.   Assessment and plan- Patient is a 42 y.o. female with compound heterozygous hereditary hemochromatosis C282Y and H63D currently under observation she is here for routine follow-up of hemochromatosis  1.  Patient has not required any phlebotomy since 2015.  Recent labs from 1 week ago revealed a normal CBC and a CMP.  Her ferritin is 35.  Which is less than 100 which would be our goal for phlebotomy.   Iron studies also reveal a mildly elevated iron saturation of 48%.  Given that patient has not required a phlebotomy in over 4 years it would be okay for her to follow-up with her primary care doctor at this time.  She will need yearly CBC, CMP, ferritin and TIBC checked.  If there is a consistent increase in her iron saturation ratio and or ferritin (>100) she can be referred back to [place] for phlebotomy  2.  Patient also had an MRI done last year which showed a 15 mm hypervascular lesion which was consistent with a benign hepatic adenoma.  No need for surveillance MRI for this   Visit Diagnosis No diagnosis found.   Dr. Randa Evens, MD, MPH [facility] [phone] 01/28/2018 8:49 AM

## 2018-01-28 NOTE — Progress Notes (Signed)
No new changes noted today 

## 2018-02-24 ENCOUNTER — Encounter: Payer: Self-pay | Admitting: Family Medicine

## 2018-02-24 ENCOUNTER — Ambulatory Visit (INDEPENDENT_AMBULATORY_CARE_PROVIDER_SITE_OTHER): Payer: 59 | Admitting: Family Medicine

## 2018-02-24 VITALS — BP 130/70 | HR 89 | Ht 66.0 in | Wt 132.0 lb

## 2018-02-24 DIAGNOSIS — M940 Chondrocostal junction syndrome [Tietze]: Secondary | ICD-10-CM | POA: Diagnosis not present

## 2018-02-24 DIAGNOSIS — M25512 Pain in left shoulder: Secondary | ICD-10-CM

## 2018-02-24 DIAGNOSIS — M999 Biomechanical lesion, unspecified: Secondary | ICD-10-CM

## 2018-02-24 NOTE — Patient Instructions (Signed)
Good to see you  Ice is your friend Tried trigger points again  Nexium or prilosec daily for 2 weeks Send me a note in 2 weeks See me again in 4-5 weeks Happy New Year!

## 2018-02-24 NOTE — Progress Notes (Signed)
[name] [address], [place] [address] Phone: [phone] Subjective:    I Kandace Blitz am serving as a Education administrator for Dr. Hulan Saas.    CC: Back pain  [id number]  Adrienne Harris is a 43 y.o. female coming in with complaint of back pain. States that she is about the same. Pain level has not changed. Still exercising 2-3 times a week. Has started using the elliptical. Usually 5 days a week for 30 minutes. Still having pain on a daily basis.  Patient describes it as dull aching sensation.  Never without discomfort in the upper trapezius.     Past Medical History:  Diagnosis Date  . Atopic dermatitis   . Basal cell carcinoma    15 years ago.  Marland Kitchen Hemochromatosis    Heterozygous for two genes  . Palpitations    a. 04/2013 48h Holter: sinus rhythm/sinus arrhythmia, rare PAC's/PVC's.  . Shoulder pain, left 01/24/2015  . Shoulder pain, right 01/24/2015   Past Surgical History:  Procedure Laterality Date  . Excision of basal cell carcinoma    . WISDOM TOOTH EXTRACTION     Social History   Socioeconomic History  . Marital status: Married    Spouse name: Not on file  . Number of children: 1  . Years of education: Not on file  . Highest education level: Not on file  Occupational History  . Occupation: Microbiologist  Social Needs  . Financial resource strain: Not on file  . Food insecurity:    Worry: Not on file    Inability: Not on file  . Transportation needs:    Medical: Not on file    Non-medical: Not on file  Tobacco Use  . Smoking status: Never Smoker  . Smokeless tobacco: Never Used  Substance and Sexual Activity  . Alcohol use: Yes    Alcohol/week: 2.0 standard drinks    Types: 2 Cans of beer per week    Comment: 1-2 beers a week  . Drug use: No  . Sexual activity: Yes    Partners: Male    Birth control/protection: Pill  Lifestyle  . Physical activity:    Days per week: 0 days    Minutes per session: 0 min  .  Stress: Only a little  Relationships  . Social connections:    Talks on phone: Not on file    Gets together: Not on file    Attends religious service: Not on file    Active member of club or organization: Not on file    Attends meetings of clubs or organizations: Not on file    Relationship status: Not on file  Other Topics Concern  . Not on file  Social History Narrative   Married      Daughter -4 yrs.       Dietitian for [employer]   Allergies  Allergen Reactions  . Cortizone-10 [Hydrocortisone]     Allergic to all topical corticosteriods  . Propylene Glycol     Skin rash   Family History  Problem Relation Age of Onset  . Hyperlipidemia Maternal Grandfather   . Hypertension Maternal Grandfather   . Diabetes Maternal Grandfather   . Stroke Maternal Grandfather   . Hypertension Father   . Melanoma Father   . Hemochromatosis Mother   . Basal cell carcinoma Mother   . Hyperlipidemia Maternal Grandmother   . Hypertension Maternal Grandmother   . Osteoporosis Maternal Grandmother   . Breast cancer Paternal  Aunt   . Kidney cancer Paternal Aunt   . Alzheimer's disease Paternal Aunt   . Alzheimer's disease Paternal Grandmother   . Colon cancer Neg Hx     Current Outpatient Medications (Endocrine & Metabolic):  .  levonorgestrel-ethinyl estradiol (NORDETTE) 0.15-30 MG-MCG tablet, Take 1 tablet by mouth daily.  Current Outpatient Medications (Cardiovascular):  .  propranolol (INDERAL) 20 MG tablet, Take 1 tablet (20 mg total) by mouth daily as needed.   Current Outpatient Medications (Analgesics):  .  ibuprofen (ADVIL,MOTRIN) 200 MG tablet, Take 200 mg by mouth every 6 (six) hours as needed.   Current Outpatient Medications (Other):  Marland Kitchen  ACZONE 7.5 % GEL,  .  cholecalciferol (VITAMIN D) 1000 units tablet, Take 1,000 Units by mouth 2 (two) times daily. .  diclofenac sodium (VOLTAREN) 1 % GEL, Apply 4 g topically 4 (four) times daily. (Patient not taking: Reported on  02/24/2018) .  gabapentin (NEURONTIN) 100 MG capsule, Take 2 capsules (200 mg total) by mouth at bedtime. (Patient not taking: Reported on 02/24/2018)    Past medical history, social, surgical and family history all reviewed in electronic medical record.  No pertanent information unless stated regarding to the chief complaint.   Review of Systems:  No headache, visual changes, nausea, vomiting, diarrhea, constipation, dizziness, abdominal pain, skin rash, fevers, chills, night sweats, weight loss, swollen lymph nodes, body aches, joint swelling,  chest pain, shortness of breath, mood changes.  Positive muscle aches  Objective  Blood pressure 130/70, pulse 89, height 5\' 6"  (1.676 m), weight 132 lb (59.9 kg), SpO2 98 %.    General: No apparent distress alert and oriented x3 mood and affect normal, dressed appropriately.  HEENT: Pupils equal, extraocular movements intact  Respiratory: Patient's speak in full sentences and does not appear short of breath  Cardiovascular: No lower extremity edema, non tender, no erythema  Skin: Warm dry intact with no signs of infection or rash on extremities or on axial skeleton.  Abdomen: Soft nontender  Neuro: Cranial nerves II through XII are intact, neurovascularly intact in all extremities with 2+ DTRs and 2+ pulses.  Lymph: No lymphadenopathy of posterior or anterior cervical chain or axillae bilaterally.  Gait normal with good balance and coordination.  MSK:  Non tender with full range of motion and good stability and symmetric strength and tone of shoulders, elbows, wrist, hip, knee and ankles bilaterally.  Neck: Inspection mild loss of lordosis  No palpable stepoffs. Negative Spurling's maneuver. Limited left-sided sidebending and rotation Grip strength and sensation normal in bilateral hands Strength good C4 to T1 distribution No sensory change to C4 to T1 Negative Hoffman sign bilaterally Reflexes normal Severe tightness of the left trapezius  with multiple trigger points   Back Exam:  Inspection: Unremarkable  Motion: Flexion 45 deg, Extension 25 deg, Side Bending to 35 deg bilaterally,  Rotation to 45 deg bilaterally  SLR laying: Negative  XSLR laying: Negative  Palpable tenderness: Tightness noted in the left SI joint. FABER: Positive left. Sensory change: Gross sensation intact to all lumbar and sacral dermatomes.  Reflexes: 2+ at both patellar tendons, 2+ at achilles tendons, Babinski's downgoing.  Strength at foot  Plantar-flexion: 5/5 Dorsi-flexion: 5/5 Eversion: 5/5 Inversion: 5/5  Leg strength  Quad: 5/5 Hamstring: 5/5 Hip flexor: 5/5 Hip abductors: 5/5  Gait unremarkable.  Osteopathic findings C2 flexed rotated and side bent right T2 extended rotated and side bent left inhaled rib T9 extended rotated and side bent left L1 flexed rotated and  side bent right Sacrum r left on left     Impression and Recommendations:     This case required medical decision making of moderate complexity. The above documentation has been reviewed and is accurate and complete Lyndal Pulley, DO       Note: This dictation was prepared with Dragon dictation along with smaller phrase technology. Any transcriptional errors that result from this process are unintentional.

## 2018-02-24 NOTE — Assessment & Plan Note (Signed)
Repeat injection given today.  Discussed icing regimen and home exercise.  Discussed which activities to do which wants to avoid.  Discussed the possibility of a viscerosomatic reflux could be potentially contributing.  Follow-up again in 4 weeks

## 2018-02-24 NOTE — Assessment & Plan Note (Signed)
I believe that this is likely contributing stone.  Patient has a history of PVCs and I do think that there is a differential for the possibility of reflux.  History of hemochromatosis as well as could be contributing to some of the aches and pains on a regular basis.  Has been responding well to manipulation attempted this but did add trigger point injections today as well.  Discussed icing regimen and home exercises.  Follow-up again 4 to 8 weeks

## 2018-02-24 NOTE — Assessment & Plan Note (Signed)
Decision today to treat with OMT was based on Physical Exam  After verbal consent patient was treated with HVLA, ME, FPR techniques in cervical, thoracic, rib lumbar and sacral areas  Patient tolerated the procedure well with improvement in symptoms  Patient given exercises, stretches and lifestyle modifications  See medications in patient instructions if given  Patient will follow up in 4-8 weeks 

## 2018-03-24 ENCOUNTER — Encounter: Payer: Self-pay | Admitting: Family Medicine

## 2018-03-31 ENCOUNTER — Ambulatory Visit: Payer: 59 | Admitting: Family Medicine

## 2018-04-26 ENCOUNTER — Other Ambulatory Visit: Payer: Self-pay | Admitting: Family

## 2018-04-26 DIAGNOSIS — Z1231 Encounter for screening mammogram for malignant neoplasm of breast: Secondary | ICD-10-CM

## 2018-05-05 ENCOUNTER — Ambulatory Visit: Payer: 59 | Admitting: Family Medicine

## 2018-05-11 ENCOUNTER — Other Ambulatory Visit: Payer: Self-pay | Admitting: Certified Nurse Midwife

## 2018-05-11 MED FILL — LEVONOR-ETH ESTRAD 0.15-0.0: 0.15-30 | 84 days supply | Qty: 84 | Fill #0

## 2018-06-03 ENCOUNTER — Ambulatory Visit: Payer: 59 | Admitting: Certified Nurse Midwife

## 2018-07-14 ENCOUNTER — Other Ambulatory Visit: Payer: Self-pay

## 2018-07-14 NOTE — Progress Notes (Signed)
ll     Gynecology Annual Exam  PCP: [name], FNP  Chief Complaint:  Chief Complaint  Patient presents with  . Annual Exam    History of Present Illness:Adrienne Harris is a 43 year old Caucasian/White female , G 1 P 1 0 0 1 , who presents for her annual exam . She is having continued problems with chronic LLQ pain ( see 04/03/2016 progress note), which she reports is now a daily nagging discomfort. Rates pain 4/10. It is not associated with any activity. She does not take anything for the pain.  A workup including a pelvic ultrasound and a CT of the abdomen was negative 2-3 years ago. .  She also reports that she continues to have some spotting the week prior to her menses, lasting 1-2 days. She would like another trial of additional estrogen. Denies cramping with the BTB. Her menses are regular and her LMP was 07/14/2018. They occur every 28 days, they last 5 days, are light flow, and are without clots.   She denies dysmenorrhea, but has menstrual migraines and takes ibuprofen for them with some relief.  The patient's past medical history is notable for a history of hemochromotosis and is followed by Dr Janese Banks. Has not needed a phlebotomy since 2015. She has palpitations and takes propanolol qAM.  Since her last annual GYN exam dated 05/15/2017, she has had increased pain with her left neck and shoulder area and has had a trial of manipulation, gabapentin, and local injections without much relief. PT seemed to have provided the most relief, but was interrupted by Covid restrictions. Has also had some elevated blood pressures at some of her medical appointments. Home blood pressures are usually in the 120s-130s/70s to 80s range. She is sexually active. She is currently using birth control pills for contraception.  Her most recent pap smear was obtained 05/15/2017 and was NIL/negative HRHPV.  She has had a recent mammogram 05/07/2017 and it was negative. She has another mammogram scheduled  08/16/2018 There is a positive history of breast cancer in her paternal aunt. Genetic testing is not indicated. There is no family history of ovarian cancer.  The patient does do occasional self breast exams.  The patient does not smoke.  The patient does have 1-2 drinks/ week The patient does not use illegal drugs.  The patient has not been exercising regularly since her last visit. The patient does get adequate calcium in her diet.  She has had a recent cholesterol screen July 2019 and it was normal.  Review of Systems: Review of Systems  Constitutional: Negative for chills, fever and weight loss.  HENT: Negative for congestion (nasal congestion with recent cold), sinus pain and sore throat.   Eyes: Negative for blurred vision and pain.  Respiratory: Negative for cough, hemoptysis, shortness of breath and wheezing.   Cardiovascular: Negative for chest pain, palpitations and leg swelling.  Gastrointestinal: Positive for abdominal pain (LLQ pain). Negative for blood in stool, diarrhea, heartburn, nausea and vomiting.  Genitourinary: Negative for dysuria, frequency, hematuria and urgency.       Positive for BTB  Musculoskeletal: Positive for neck pain (and left shoulder). Negative for back pain, joint pain and myalgias.  Skin: Negative for itching and rash.  Neurological: Positive for headaches (menstrual migraines). Negative for dizziness and tingling.  Endo/Heme/Allergies: Negative for environmental allergies and polydipsia. Does not bruise/bleed easily.       Negative for hirsutism   Psychiatric/Behavioral: Negative for depression. The patient is  not nervous/anxious and does not have insomnia.     Past Medical History:  Past Medical History:  Diagnosis Date  . Atopic dermatitis   . Basal cell carcinoma    15 years ago.  Marland Kitchen Hemochromatosis    Heterozygous for two genes  . Palpitations    a. 04/2013 48h Holter: sinus rhythm/sinus arrhythmia, rare PAC's/PVC's.  . Shoulder pain, left  01/24/2015  . Shoulder pain, right 01/24/2015    Past Surgical History:  Past Surgical History:  Procedure Laterality Date  . Excision of basal cell carcinoma    . WISDOM TOOTH EXTRACTION      Family History:  Family History  Problem Relation Age of Onset  . Hyperlipidemia Maternal Grandfather   . Hypertension Maternal Grandfather   . Diabetes Maternal Grandfather   . Stroke Maternal Grandfather   . Hypertension Father   . Melanoma Father   . Colon polyps Father 2       adenomatous  . Hemochromatosis Mother   . Basal cell carcinoma Mother   . Hyperlipidemia Maternal Grandmother   . Hypertension Maternal Grandmother   . Osteoporosis Maternal Grandmother   . Breast cancer Paternal Aunt   . Kidney cancer Paternal Aunt   . Alzheimer's disease Paternal Aunt   . Alzheimer's disease Paternal Grandmother   . Colon cancer Neg Hx     Social History:  Social History   Socioeconomic History  . Marital status: Married    Spouse name: Not on file  . Number of children: 1  . Years of education: Not on file  . Highest education level: Not on file  Occupational History  . Occupation: Microbiologist  Social Needs  . Financial resource strain: Not on file  . Food insecurity:    Worry: Not on file    Inability: Not on file  . Transportation needs:    Medical: Not on file    Non-medical: Not on file  Tobacco Use  . Smoking status: Never Smoker  . Smokeless tobacco: Never Used  Substance and Sexual Activity  . Alcohol use: Yes    Alcohol/week: 2.0 standard drinks    Types: 2 Cans of beer per week    Comment: 1-2 beers a week  . Drug use: No  . Sexual activity: Yes    Partners: Male    Birth control/protection: Pill  Lifestyle  . Physical activity:    Days per week: 0 days    Minutes per session: 0 min  . Stress: Only a little  Relationships  . Social connections:    Talks on phone: Not on file    Gets together: Not on file    Attends religious service: Not on file     Active member of club or organization: Not on file    Attends meetings of clubs or organizations: Not on file    Relationship status: Not on file  . Intimate partner violence:    Fear of current or ex partner: Not on file    Emotionally abused: Not on file    Physically abused: Not on file    Forced sexual activity: Not on file  Other Topics Concern  . Not on file  Social History Narrative   Married      Daughter -11 yrs.       Dietitian for [employer]    Allergies:  Allergies  Allergen Reactions  . Cortizone-10 [Hydrocortisone]     Allergic to all topical corticosteriods  . Propylene Glycol  Skin rash    Medications:  Current Outpatient Medications:  .  cholecalciferol (VITAMIN D) 1000 units tablet, Take 1,000 Units by mouth 2 (two) times daily., Disp: , Rfl:  .  ibuprofen (ADVIL,MOTRIN) 200 MG tablet, Take 200 mg by mouth every 6 (six) hours as needed., Disp: , Rfl:  .  levonorgestrel-ethinyl estradiol (NORDETTE) 0.15-30 MG-MCG tablet, Take 1 tablet by mouth daily., Disp: 84 tablet, Rfl: 3 .  propranolol (INDERAL) 20 MG tablet, Take 1 tablet (20 mg total) by mouth daily as needed., Disp: 90 tablet, Rfl: 1 .  ACZONE 7.5 % GEL, , Disp: , Rfl: 2     Physical Exam Vitals: BP 136/88   Ht 5\' 6"  (1.676 m)   Wt 134 lb (60.8 kg)   LMP 07/14/2018   BMI 21.63 kg/m   General: WF in NAD HEENT: normocephalic, anicteric Neck: no thyroid enlargement, no palpable nodules, no cervical lymphadenopathy  Pulmonary: No increased work of breathing, CTAB Cardiovascular: RRR, without murmur  Breast: Breast symmetrical, no tenderness, no palpable nodules or masses, no skin or nipple retraction present, no nipple discharge.  No axillary, infraclavicular or supraclavicular lymphadenopathy. Abdomen: Soft, non-tender, non-distended.  Umbilicus without lesions.  No hepatomegaly or masses palpable. No evidence of hernia. Genitourinary:  External: Normal external female genitalia.  Normal urethral  meatus, normal Bartholin's and Skene's glands.    Vagina: Normal vaginal mucosa, no evidence of prolapse.    Cervix: Grossly normal in appearance, small amount of bleeding, non-tender  Uterus: Anteverted, normal size, shape, and consistency, mobile, and non-tender  Adnexa: No adnexal masses, non-tender on right and mild tenderness on left  Rectal: deferred  Lymphatic: no evidence of inguinal lymphadenopathy Extremities: no edema, erythema, or tenderness Neurologic: Grossly intact Psychiatric: mood appropriate, affect full     Assessment: 43 y.o. presents for annual exam Chronic daily LLQ pain-pelvic ultrasound ordered BTB on pills  Plan:   1) Breast cancer screening - recommend monthly self breast exam and annual screening mammograms.. Mammogram is scheduled for 7/6.  2) Cervical cancer screening - Pap was done. Discussed how frequently patient desires Pap smears and she desires them yearly with annual exams.  3) Contraception - Refills for Nordette called in x 1year  4) Routine healthcare maintenance including cholesterol and diabetes screening managed by PCP   5) Pelvic ultrasound today reveals two small fibroids. No adnexal masses seen  Findings:  The uterus is anteverted and measures 7.0 x 3.7 x 3.0cm. Echo texture is homogenous with evidence of focal masses. Within the uterus are multiple suspected fibroids measuring: Fibroid 1:  0.8 x 0.7 x 0.6cm (LT/high, IM) Fibroid 2:  1.0 x 0.8 x 0.7cm (LT/mid, SS)  The Endometrium measures 2.9 mm.  Right Ovary measures 1.9 x 1.1 x 1.3 cm. It is normal in appearance. Left Ovary measures 2.2 x 1.0 x 1.0 cm. It is normal in appearance. Survey of the adnexa demonstrates no adnexal masses. There is no free fluid in the cul de sac.  Discussed limitations to the pelvic ultrasound specifically in diagnosing problems like endometriosis Normally would try to treat by eliminating menses, but hematologist wants patient to have blood  loss to help keep the iron level normal. If pain worsens and becomes more problematic, would refer to surgeon to discuss doing diagnostic laprascopy.   5) RTO 1 year and prn.   Dalia Heading, CNM

## 2018-07-15 ENCOUNTER — Ambulatory Visit (INDEPENDENT_AMBULATORY_CARE_PROVIDER_SITE_OTHER): Payer: 59

## 2018-07-15 ENCOUNTER — Other Ambulatory Visit (HOSPITAL_COMMUNITY)
Admission: RE | Admit: 2018-07-15 | Discharge: 2018-07-15 | Disposition: A | Discharge status: Home / Self Care | Payer: 59 | Source: Ambulatory Visit | Attending: Certified Nurse Midwife | Admitting: Certified Nurse Midwife

## 2018-07-15 ENCOUNTER — Ambulatory Visit (INDEPENDENT_AMBULATORY_CARE_PROVIDER_SITE_OTHER): Payer: 59 | Admitting: Certified Nurse Midwife

## 2018-07-15 ENCOUNTER — Encounter: Payer: Self-pay | Admitting: Certified Nurse Midwife

## 2018-07-15 VITALS — BP 136/88 | Ht 66.0 in | Wt 134.0 lb

## 2018-07-15 DIAGNOSIS — Z124 Encounter for screening for malignant neoplasm of cervix: Secondary | ICD-10-CM

## 2018-07-15 DIAGNOSIS — D252 Subserosal leiomyoma of uterus: Secondary | ICD-10-CM | POA: Diagnosis not present

## 2018-07-15 DIAGNOSIS — Z01419 Encounter for gynecological examination (general) (routine) without abnormal findings: Secondary | ICD-10-CM

## 2018-07-15 DIAGNOSIS — G8929 Other chronic pain: Secondary | ICD-10-CM

## 2018-07-15 DIAGNOSIS — D251 Intramural leiomyoma of uterus: Secondary | ICD-10-CM | POA: Diagnosis not present

## 2018-07-15 DIAGNOSIS — R1032 Left lower quadrant pain: Secondary | ICD-10-CM

## 2018-07-15 DIAGNOSIS — Z1239 Encounter for other screening for malignant neoplasm of breast: Secondary | ICD-10-CM

## 2018-07-15 MED ORDER — ESTRADIOL 1 MG PO TABS: ORAL_TABLET | ORAL | 0 refills | Status: DC

## 2018-07-15 MED ORDER — LEVONORGESTREL-ETHINYL ESTRAD 0.15-30 MG-MCG PO TABS: 1.0000 | ORAL_TABLET | Freq: Every day | ORAL | 3 refills | Status: DC

## 2018-07-16 ENCOUNTER — Ambulatory Visit (INDEPENDENT_AMBULATORY_CARE_PROVIDER_SITE_OTHER): Payer: 59 | Admitting: Family

## 2018-07-16 ENCOUNTER — Encounter: Payer: Self-pay | Admitting: Family

## 2018-07-16 ENCOUNTER — Other Ambulatory Visit: Payer: Self-pay

## 2018-07-16 VITALS — BP 138/82 | HR 95 | Temp 98.3°F | Ht 66.2 in | Wt 133.8 lb

## 2018-07-16 DIAGNOSIS — Z0000 Encounter for general adult medical examination without abnormal findings: Secondary | ICD-10-CM | POA: Diagnosis not present

## 2018-07-16 DIAGNOSIS — M25512 Pain in left shoulder: Secondary | ICD-10-CM

## 2018-07-16 DIAGNOSIS — I493 Ventricular premature depolarization: Secondary | ICD-10-CM | POA: Diagnosis not present

## 2018-07-16 LAB — CBC WITH DIFFERENTIAL/PLATELET
Basophils Absolute: 0 10*3/uL (ref 0.0–0.1)
Basophils Relative: 0.7 % (ref 0.0–3.0)
Eosinophils Absolute: 0.1 10*3/uL (ref 0.0–0.7)
Eosinophils Relative: 1.2 % (ref 0.0–5.0)
HCT: 44 % (ref 36.0–46.0)
Hemoglobin: 15.2 g/dL — ABNORMAL HIGH (ref 12.0–15.0)
Lymphocytes Relative: 34.7 % (ref 12.0–46.0)
Lymphs Abs: 1.8 10*3/uL (ref 0.7–4.0)
MCHC: 34.6 g/dL (ref 30.0–36.0)
MCV: 97.5 fl (ref 78.0–100.0)
Monocytes Absolute: 0.6 10*3/uL (ref 0.1–1.0)
Monocytes Relative: 11 % (ref 3.0–12.0)
Neutro Abs: 2.7 10*3/uL (ref 1.4–7.7)
Neutrophils Relative %: 52.4 % (ref 43.0–77.0)
Platelets: 271 10*3/uL (ref 150.0–400.0)
RBC: 4.51 Mil/uL (ref 3.87–5.11)
RDW: 12.5 % (ref 11.5–15.5)
WBC: 5.1 10*3/uL (ref 4.0–10.5)

## 2018-07-16 LAB — COMPREHENSIVE METABOLIC PANEL
ALT: 12 U/L (ref 0–35)
AST: 13 U/L (ref 0–37)
Albumin: 4.2 g/dL (ref 3.5–5.2)
Alkaline Phosphatase: 54 U/L (ref 39–117)
BUN: 10 mg/dL (ref 6–23)
CO2: 30 mEq/L (ref 19–32)
Calcium: 9.4 mg/dL (ref 8.4–10.5)
Chloride: 103 mEq/L (ref 96–112)
Creatinine, Ser: 0.69 mg/dL (ref 0.40–1.20)
GFR: 93.08 mL/min (ref >60.00)
Glucose, Bld: 73 mg/dL (ref 70–99)
Potassium: 3.8 mEq/L (ref 3.5–5.1)
Sodium: 142 mEq/L (ref 135–145)
Total Bilirubin: 0.5 mg/dL (ref 0.2–1.2)
Total Protein: 6.9 g/dL (ref 6.0–8.3)

## 2018-07-16 LAB — CYTOLOGY - PAP: Diagnosis: NEGATIVE

## 2018-07-16 LAB — IBC + FERRITIN
Ferritin: 65.3 ng/mL (ref 10.0–291.0)
Iron: 205 ug/dL — ABNORMAL HIGH (ref 42–145)
Saturation Ratios: 57.9 % — ABNORMAL HIGH (ref 20.0–50.0)
Transferrin: 253 mg/dL (ref 212.0–360.0)

## 2018-07-16 LAB — VITAMIN D 25 HYDROXY (VIT D DEFICIENCY, FRACTURES): VITD: 48.25 ng/mL (ref 30.00–100.00)

## 2018-07-16 LAB — B12 AND FOLATE PANEL
Folate: 10.6 ng/mL (ref >5.9)
Vitamin B-12: 202 pg/mL — ABNORMAL LOW (ref 211–911)

## 2018-07-16 LAB — TSH: TSH: 1.1 u[IU]/mL (ref 0.35–4.50)

## 2018-07-16 MED ORDER — PROPRANOLOL HCL 20 MG PO TABS: 20.0000 mg | ORAL_TABLET | Freq: Every day | ORAL | 1 refills | Status: DC | PRN

## 2018-07-16 NOTE — Assessment & Plan Note (Addendum)
Clinical breast exam performed.  Deferred pelvic exam in the absence of complaints, patient follows with OB/GYN and is up-to-date on Pap smear.  Thyroid does not appear enlarged for me today.  Patient declines thyroid ultrasound.  She also declines consult with Dr. Vira Agar in regards to father's history of adenomatous polyps.  Information regarding colon cancer screening, based on family history given to patient on AVS.  She will let me know if changes her mind in regards to referrals, [place].

## 2018-07-16 NOTE — Progress Notes (Signed)
Subjective:    Patient ID: Adrienne Harris, female    DOB: 11-Feb-1976, 43 y.o.   MRN: [id number]  CC: Adrienne Harris is a 43 y.o. female who presents today for physical exam.    HPI: HPI  Overall doing well. No new complaints.   PVCs- 'few more PVCs' since 'pandemic.' Last for a couple for seconds. Doesn't happen with valsalva.  More noticeable about the holidays. Thinks stress contributory.  Occurring everyday however notes that she has been off the propranolol; Hasnt seen Gollan in a couple of years , initial consult 04/2013.  One cup of coffee/day.  Denies exertional chest pain or pressure, numbness or tingling radiating to left arm or jaw, , dizziness, frequent headaches, changes in vision, or shortness of breath.   Left neck and shoulder pain- unchanged. Has been following with Dr Tamala Julian.  States during 1 visit he did mention her thyroid might be enlarged.  She not had ultrasound for this.no trouble  has had steriod injections , trial of gabapentin without relief. Some relief with dry needling. Doing home exercises.  XRays of left shoulder and c-spine.  ( both negative). Has recommended physical therapy. No numbness, weakness in left arm.    Hereditary hemochromatosis-follows with Dr. Janese Banks  Colorectal Cancer Screening: NO early family history.  Breast Cancer Screening: Mammogram scheduled Cervical Cancer Screening: Follows with GYN; pap done yesterday per patient Bone Health screening/DEXA for 65+: No increased fracture risk. Defer screening at this time. Lung Cancer Screening: Doesn't have 30 year pack year history and age > 7 years. Immunizations       Tetanus - utd       Labs: Screening labs today. Exercise: Gets regular exercise.  Alcohol use: occassional Smoking/tobacco use: Nonsmoker.  Regular dental exams: UTD Wears seat belt: Yes. Skin: follows with Dr Evorn Gong   HISTORY:  Past Medical History:  Diagnosis Date  . Atopic dermatitis   . Basal cell carcinoma    15 years  ago.  Marland Kitchen Hemochromatosis    Heterozygous for two genes  . Palpitations    a. 04/2013 48h Holter: sinus rhythm/sinus arrhythmia, rare PAC's/PVC's.  . Shoulder pain, left 01/24/2015  . Shoulder pain, right 01/24/2015    Past Surgical History:  Procedure Laterality Date  . Excision of basal cell carcinoma    . WISDOM TOOTH EXTRACTION     Family History  Problem Relation Age of Onset  . Hyperlipidemia Maternal Grandfather   . Hypertension Maternal Grandfather   . Diabetes Maternal Grandfather   . Stroke Maternal Grandfather   . Hypertension Father   . Melanoma Father   . Colon polyps Father 21       adenomatous  . Hemochromatosis Mother   . Basal cell carcinoma Mother   . Hyperlipidemia Maternal Grandmother   . Hypertension Maternal Grandmother   . Osteoporosis Maternal Grandmother   . Breast cancer Paternal Aunt   . Kidney cancer Paternal Aunt   . Alzheimer's disease Paternal Aunt   . Alzheimer's disease Paternal Grandmother   . Colon cancer Neg Hx       ALLERGIES: Cortizone-10 [hydrocortisone] and Propylene glycol  Current Outpatient Medications on File Prior to Visit  Medication Sig Dispense Refill  . ACZONE 7.5 % GEL   2  . cholecalciferol (VITAMIN D) 1000 units tablet Take 1,000 Units by mouth 2 (two) times daily.    . diclofenac sodium (VOLTAREN) 1 % GEL Apply 4 g topically 4 (four) times daily. (Patient not taking: Reported  on 02/24/2018) 1 Tube 3  . estradiol (ESTRACE) 1 MG tablet Take one tab daily x 14 days when starting the next two packs of birth control pills 28 tablet 0  . ibuprofen (ADVIL,MOTRIN) 200 MG tablet Take 200 mg by mouth every 6 (six) hours as needed.    Marland Kitchen levonorgestrel-ethinyl estradiol (NORDETTE) 0.15-30 MG-MCG tablet Take 1 tablet by mouth daily. 84 tablet 3   No current facility-administered medications on file prior to visit.     Social History   Tobacco Use  . Smoking status: Never Smoker  . Smokeless tobacco: Never Used  Substance Use  Topics  . Alcohol use: Yes    Alcohol/week: 2.0 standard drinks    Types: 2 Cans of beer per week    Comment: 1-2 beers a week  . Drug use: No    Review of Systems  Constitutional: Negative for chills, fever and unexpected weight change.  HENT: Negative for congestion.   Respiratory: Negative for cough and shortness of breath.   Cardiovascular: Positive for palpitations. Negative for chest pain and leg swelling.  Gastrointestinal: Negative for nausea and vomiting.  Musculoskeletal: Negative for arthralgias and myalgias.  Skin: Negative for rash.  Neurological: Negative for headaches.  Hematological: Negative for adenopathy.  Psychiatric/Behavioral: Negative for confusion.      Objective:    BP 138/82   Pulse 95   Temp 98.3 F (36.8 C)   Ht 5' 6.2" (1.681 m)   Wt 133 lb 12.8 oz (60.7 kg)   LMP 07/14/2018   SpO2 97%   BMI 21.47 kg/m   BP Readings from Last 3 Encounters:  07/16/18 138/82  07/15/18 136/88  02/24/18 130/70   Wt Readings from Last 3 Encounters:  07/16/18 133 lb 12.8 oz (60.7 kg)  07/15/18 134 lb (60.8 kg)  02/24/18 132 lb (59.9 kg)    Physical Exam Vitals signs reviewed.  Constitutional:      Appearance: She is well-developed.  Eyes:     Conjunctiva/sclera: Conjunctivae normal.  Neck:     Thyroid: No thyroid mass or thyromegaly.  Cardiovascular:     Rate and Rhythm: Normal rate and regular rhythm.     Pulses: Normal pulses.     Heart sounds: Normal heart sounds.  Pulmonary:     Effort: Pulmonary effort is normal.     Breath sounds: Normal breath sounds. No wheezing, rhonchi or rales.  Chest:     Breasts: Breasts are symmetrical.        Right: No inverted nipple, mass, nipple discharge, skin change or tenderness.        Left: No inverted nipple, mass, nipple discharge, skin change or tenderness.  Lymphadenopathy:     Head:     Right side of head: No submental, submandibular, tonsillar, preauricular, posterior auricular or occipital  adenopathy.     Left side of head: No submental, submandibular, tonsillar, preauricular, posterior auricular or occipital adenopathy.     Cervical: No cervical adenopathy.     Right cervical: No superficial, deep or posterior cervical adenopathy.    Left cervical: No superficial, deep or posterior cervical adenopathy.  Skin:    General: Skin is warm and dry.  Neurological:     Mental Status: She is alert.  Psychiatric:        Speech: Speech normal.        Behavior: Behavior normal.        Thought Content: Thought content normal.        Assessment & Plan:  Problem List Items Addressed This Visit      Cardiovascular and Mediastinum   PVC's (premature ventricular contractions)    Regular rhythm on exam .Advised to restart propranolol. declines consult with Gollan today. She will let me know how she is doing.      Relevant Medications   propranolol (INDERAL) 20 MG tablet   Other Relevant Orders   TSH     Other   Hemochromatosis   Relevant Orders   CBC with Differential/Platelet   IBC + Ferritin   Routine physical examination - Primary    Clinical breast exam performed.  Deferred pelvic exam in the absence of complaints, patient follows with OB/GYN and is up-to-date on Pap smear.  Thyroid does not appear enlarged for me today.  Patient declines thyroid ultrasound.  She also declines consult with Dr. Vira Agar in regards to father's history of adenomatous polyps.  Information regarding colon cancer screening, based on family history given to patient on AVS.  She will let me know if changes her mind in regards to referrals, [place].       Relevant Orders   CBC with Differential/Platelet   Comprehensive metabolic panel   TSH   VITAMIN D 25 Hydroxy (Vit-D Deficiency, Fractures)   B12 and Folate Panel   Trigger point of left shoulder region    Unchanged.  Following Gardenia Phlegm.  Will follow          I have discontinued Mecca B. Beza's gabapentin. I am also having her maintain her  Aczone, ibuprofen, diclofenac sodium, cholecalciferol, levonorgestrel-ethinyl estradiol, estradiol, and propranolol.   Meds ordered this encounter  Medications  . propranolol (INDERAL) 20 MG tablet    Sig: Take 1 tablet (20 mg total) by mouth daily as needed.    Dispense:  90 tablet    Refill:  1    Order Specific Question:   Supervising Provider    Answer:   Crecencio Mc [[id number]]    Return precautions given.   Risks, benefits, and alternatives of the medications and treatment plan prescribed today were discussed, and patient expressed understanding.   Education regarding symptom management and diagnosis given to patient on AVS.   Continue to follow with [name], FNP for routine health maintenance.   Adrienne Harris and I agreed with plan.   Mable Paris, FNP

## 2018-07-16 NOTE — Assessment & Plan Note (Addendum)
Regular rhythm on exam .Advised to restart propranolol. declines consult with Gollan today. She will let me know how she is doing.

## 2018-07-16 NOTE — Patient Instructions (Addendum)
Restart the propranolol.   Please review the following and let me know if you would like referral to Dr Vira Agar to discuss earlier screening.   Let me know as well if you would like to pursue ultrasound of your thyroid  As discussed today regarding colon cancer risk ( CRC)  and your family history of polyps.   Typically we start screening at 43 years old for AVERAGE risk people.   We assess the risk for CRC at the initial visit for an adult who is age 43 years or older to identify high-risk patients who should begin CRC screening at an earlier age than those at average risk. Subsequent reassessment every three to five years identifies whether the patient or family members have developed factors that raise the patient's level of risk for CRC  Factors important to determine CRC risk can be assessed by asking several questions. A "no" response to all of these questions generally indicates AVERAGE risk.  ?Have you ever had CRC or an adenomatous polyp? A personal history of CRC increases the risk of another primary (metachronous) cancer. Surveillance following CRC is discussed separately. A personal history of adenomatous colorectal polyps increases the risk of CRC . The number and types of polyp lesions guide the determination of the appropriate interval for surveillance.  If there is uncertainty about the patient's personal polyp history, records should be obtained to determine if the patient had an adenomatous polyp. If records cannot be obtained, the patient may recall being told a polyp was found and advised to have a follow-up colonoscopy in five years or sooner, suggesting the polyp was adenomatous.  ?Have any family members had CRC or a documented advanced polyp? An advanced polyp is defined as an advanced adenoma (adenoma ?1 cm, or with high-grade dysplasia, or with villous elements) or advanced serrated lesion (sessile serrated polyp [SSP] ?1 cm, or traditional serrated adenoma ?1 cm, or SSP  with cytologic dysplasia).  The [place] Multi-Society Task Force (MSTF) on Colorectal Cancer recommends that, if there is documentation that an FDR had an advanced adenoma (adenoma ?1 cm, or with high-grade dysplasia, or with villous elements) or polyp requiring surgical excision, this should be weighted the same as having an FDR with CRC when suggesting screening programs [20]. The yield of screening colonoscopy in patients with FDRs who had advanced adenomas is substantially increased. However, the MSTF no longer recommends that patients undergo intensified colon screening without clear documentation that an FDR's polyp was advanced; in the absence of documentation that an FDR's polyp was advanced, it should be assumed it was not advanced    If so, how many family members, were they first-degree relatives (parent, sibling, or child), and at what age was the cancer or polyp first diagnosed? Enhanced screening is warranted for a patient with increased risk of CRC due to a family history of CRC or documented advanced polyp and is described in detail separately. If a family member is reported to have had a polyp but there is lack of available documentation about the type of polyp, the patient is typically screened as if a family member did not have an advanced polyp. If the patient cannot obtain any family history whatsoever related to colorectal cancer or polyps, some experts suggest screening the patient as average risk, although there are no data that evaluate that approach. ?Do you have family members with any of the known genetic syndromes ( such as Lynch Syndrome) that can cause CRC? Patients with a family history  of a known genetic syndrome for CRC may require enhanced screening plus genetic counseling.   Obtaining family history permits determination of the number of First Degree Relatives( FDR) diagnosed with ANY of the following: ?Colorectal cancer (CRC) ?Documented advanced polyp with ANY of the  following: .Advanced adenoma -Adenoma size ?1 cm -Adenoma with high-grade dysplasia -Adenoma with villous elements .Advanced serrated lesion -Sessile serrated polyp (SSP) ?1 cm -Traditional serrated adenoma ?1 cm -SSP with cytologic dysplasia  We suggest using the information about first-degree relatives (FDRs) with ANY of the above to screen as follows: ?One FDR diagnosed at age <60 years - Begin screening at age 29 years, or 10 years before the FDR's diagnosis, whichever is earlier. We suggest colonoscopy every five years. If the patient declines colonoscopy, annual fecal immunochemical testing (FIT) should be offered. ?Two or more FDRs diagnosed at any age - Begin screening at age 34 years, or 10 years before the youngest FDR's diagnosis, whichever is earlier. We suggest colonoscopy every five years. If the patient declines colonoscopy, annual FIT should be offered. ?One FDR diagnosed at age ?15 years - Begin screening at age 38 years, using the same screening options as for average-risk patients, at the same frequency as for average-risk patients. If the only family history is an FDR with a polyp NOT clearly documented as an advanced adenoma or serrated lesion, we suggest that the patient be screened as an average-risk patient, because of the potential inaccuracy of the family history. This is in contrast to patients whose FDR has a documented history of an advanced adenoma or serrated lesion, who should be screened similarly to those with a family history of CRC, as indicated above.  These suggestions are in keeping with the 2017 guidelines from the [place] Multi-Society Task Force (MSTF) on Colorectal Cancer, issued collaboratively on behalf of the [employer] [facility], the [employer], and the [employer] [facility]

## 2018-07-16 NOTE — Assessment & Plan Note (Signed)
Unchanged.  Following Gardenia Phlegm.  Will follow

## 2018-07-19 ENCOUNTER — Other Ambulatory Visit: Payer: Self-pay | Admitting: Family

## 2018-07-19 ENCOUNTER — Encounter: Payer: Self-pay | Admitting: Oncology

## 2018-08-16 ENCOUNTER — Ambulatory Visit
Admission: RE | Admit: 2018-08-16 | Discharge: 2018-08-16 | Disposition: A | Discharge status: Home / Self Care | Payer: 59 | Source: Ambulatory Visit | Attending: Family | Admitting: Family

## 2018-08-16 ENCOUNTER — Other Ambulatory Visit: Payer: Self-pay

## 2018-08-16 DIAGNOSIS — Z1231 Encounter for screening mammogram for malignant neoplasm of breast: Secondary | ICD-10-CM | POA: Insufficient documentation

## 2018-08-17 ENCOUNTER — Other Ambulatory Visit: Payer: Self-pay | Admitting: Family

## 2018-08-17 DIAGNOSIS — N6489 Other specified disorders of breast: Secondary | ICD-10-CM

## 2018-08-17 DIAGNOSIS — R928 Other abnormal and inconclusive findings on diagnostic imaging of breast: Secondary | ICD-10-CM

## 2018-08-19 DIAGNOSIS — H40003 Preglaucoma, unspecified, bilateral: Secondary | ICD-10-CM | POA: Diagnosis not present

## 2018-08-25 ENCOUNTER — Other Ambulatory Visit: Payer: Self-pay

## 2018-08-25 ENCOUNTER — Ambulatory Visit
Admission: RE | Admit: 2018-08-25 | Discharge: 2018-08-25 | Disposition: A | Discharge status: Home / Self Care | Payer: 59 | Source: Ambulatory Visit | Attending: Family | Admitting: Family

## 2018-08-25 DIAGNOSIS — N6489 Other specified disorders of breast: Secondary | ICD-10-CM

## 2018-08-25 DIAGNOSIS — R928 Other abnormal and inconclusive findings on diagnostic imaging of breast: Secondary | ICD-10-CM | POA: Diagnosis not present

## 2018-08-25 DIAGNOSIS — N6002 Solitary cyst of left breast: Secondary | ICD-10-CM | POA: Diagnosis not present

## 2018-08-26 DIAGNOSIS — H5213 Myopia, bilateral: Secondary | ICD-10-CM | POA: Diagnosis not present

## 2018-10-25 ENCOUNTER — Telehealth: Payer: Self-pay

## 2018-10-25 NOTE — Telephone Encounter (Signed)
Copied from [place] [id number]. Topic: General - Other >> Oct 25, 2018  4:02 PM Pauline Good wrote: Reason for CRM: pt need order entered in for Vitamin B-12 level. Please advise

## 2018-10-26 ENCOUNTER — Other Ambulatory Visit: Payer: Self-pay

## 2018-10-26 DIAGNOSIS — E538 Deficiency of other specified B group vitamins: Secondary | ICD-10-CM

## 2018-10-26 DIAGNOSIS — R7989 Other specified abnormal findings of blood chemistry: Secondary | ICD-10-CM

## 2018-10-26 DIAGNOSIS — Z0000 Encounter for general adult medical examination without abnormal findings: Secondary | ICD-10-CM

## 2018-10-26 NOTE — Telephone Encounter (Signed)
LMTCb to office to schedule B12 lab draw.

## 2018-10-29 ENCOUNTER — Telehealth: Payer: Self-pay | Admitting: *Deleted

## 2018-10-29 DIAGNOSIS — E538 Deficiency of other specified B group vitamins: Secondary | ICD-10-CM

## 2018-10-29 NOTE — Telephone Encounter (Signed)
b12 future order placed

## 2018-10-29 NOTE — Telephone Encounter (Signed)
Please place future orders for lab appt.  

## 2018-11-01 ENCOUNTER — Other Ambulatory Visit: Payer: Self-pay

## 2018-11-01 ENCOUNTER — Other Ambulatory Visit (INDEPENDENT_AMBULATORY_CARE_PROVIDER_SITE_OTHER): Payer: 59

## 2018-11-01 DIAGNOSIS — E538 Deficiency of other specified B group vitamins: Secondary | ICD-10-CM

## 2018-11-01 LAB — B12 AND FOLATE PANEL
Folate: 10 ng/mL (ref >5.9)
Vitamin B-12: 335 pg/mL (ref 211–911)

## 2018-12-02 DIAGNOSIS — D2272 Melanocytic nevi of left lower limb, including hip: Secondary | ICD-10-CM | POA: Diagnosis not present

## 2018-12-02 DIAGNOSIS — D2271 Melanocytic nevi of right lower limb, including hip: Secondary | ICD-10-CM | POA: Diagnosis not present

## 2018-12-02 DIAGNOSIS — D225 Melanocytic nevi of trunk: Secondary | ICD-10-CM | POA: Diagnosis not present

## 2018-12-02 DIAGNOSIS — D2262 Melanocytic nevi of left upper limb, including shoulder: Secondary | ICD-10-CM | POA: Diagnosis not present

## 2018-12-02 DIAGNOSIS — L700 Acne vulgaris: Secondary | ICD-10-CM | POA: Diagnosis not present

## 2018-12-02 DIAGNOSIS — L308 Other specified dermatitis: Secondary | ICD-10-CM | POA: Diagnosis not present

## 2018-12-02 DIAGNOSIS — D2261 Melanocytic nevi of right upper limb, including shoulder: Secondary | ICD-10-CM | POA: Diagnosis not present

## 2019-02-25 ENCOUNTER — Ambulatory Visit: Payer: 59 | Attending: Internal Medicine

## 2019-02-25 DIAGNOSIS — Z20822 Contact with and (suspected) exposure to covid-19: Secondary | ICD-10-CM | POA: Diagnosis not present

## 2019-02-26 LAB — NOVEL CORONAVIRUS, NAA: SARS-CoV-2, NAA: NOT DETECTED

## 2019-03-08 ENCOUNTER — Other Ambulatory Visit: Payer: Self-pay | Admitting: Family

## 2019-03-08 DIAGNOSIS — I493 Ventricular premature depolarization: Secondary | ICD-10-CM

## 2019-04-27 ENCOUNTER — Telehealth: Payer: Self-pay | Admitting: Family

## 2019-04-27 NOTE — Telephone Encounter (Signed)
Lvm for pt to call back and reschedule physical.

## 2019-07-20 ENCOUNTER — Other Ambulatory Visit: Payer: Self-pay | Admitting: Family

## 2019-07-20 ENCOUNTER — Ambulatory Visit: Payer: 59 | Admitting: Certified Nurse Midwife

## 2019-07-20 DIAGNOSIS — Z1231 Encounter for screening mammogram for malignant neoplasm of breast: Secondary | ICD-10-CM

## 2019-07-21 ENCOUNTER — Ambulatory Visit (INDEPENDENT_AMBULATORY_CARE_PROVIDER_SITE_OTHER): Payer: 59 | Admitting: Certified Nurse Midwife

## 2019-07-21 ENCOUNTER — Other Ambulatory Visit: Payer: Self-pay

## 2019-07-21 ENCOUNTER — Encounter: Payer: Self-pay | Admitting: Certified Nurse Midwife

## 2019-07-21 ENCOUNTER — Other Ambulatory Visit: Payer: Self-pay | Admitting: Certified Nurse Midwife

## 2019-07-21 ENCOUNTER — Other Ambulatory Visit (HOSPITAL_COMMUNITY)
Admission: RE | Admit: 2019-07-21 | Discharge: 2019-07-21 | Disposition: A | Discharge status: Home / Self Care | Payer: 59 | Source: Ambulatory Visit | Attending: Certified Nurse Midwife | Admitting: Certified Nurse Midwife

## 2019-07-21 VITALS — BP 116/72 | Ht 66.0 in | Wt 134.2 lb

## 2019-07-21 DIAGNOSIS — Z124 Encounter for screening for malignant neoplasm of cervix: Secondary | ICD-10-CM

## 2019-07-21 DIAGNOSIS — Z01419 Encounter for gynecological examination (general) (routine) without abnormal findings: Secondary | ICD-10-CM | POA: Diagnosis not present

## 2019-07-21 DIAGNOSIS — R87610 Atypical squamous cells of undetermined significance on cytologic smear of cervix (ASC-US): Secondary | ICD-10-CM | POA: Diagnosis not present

## 2019-07-21 MED ORDER — LEVONORGESTREL-ETHINYL ESTRAD 0.15-30 MG-MCG PO TABS: 1.0000 | ORAL_TABLET | Freq: Every day | ORAL | 3 refills | Status: DC

## 2019-07-21 NOTE — Patient Instructions (Signed)
[facility] are nurse midwives Ardeth Perfect, our PA Dr Gilman Schmidt, MD

## 2019-07-21 NOTE — Progress Notes (Signed)
ll     Gynecology Annual Exam  PCP: [name], FNP  Chief Complaint:  Chief Complaint  Patient presents with  . Gynecologic Exam    History of Present Illness:Adrienne Harris is a 44 year old Caucasian/White female , G 1 P 1 0 0 1 , who presents for her annual exam .  Her menses are regular and her LMP was 07/13/2019. They occur every 28 days, they last 5 days, are light flow, and are without clots. The spotting she was having prior to h couple rounds f estrogen.  She denies dysmenorrhea, but has menstrual migraines and takes ibuprofen for them with some relief. She also complains of gas and bloating and constipation premenstrually. The patient's past medical history is notable for a history of hemochromotosis and is followed by Dr Janese Banks. Has not needed a phlebotomy since 2015. She has palpitations and takes propanolol qAM. She also has a hx of left shoulder/ neck pain.  Since her last annual GYN exam dated 07/15/2018, she has had no other significant changes in her health. An pelvic ultrasound done for chronic LLQ pain revealed small subcentimeter fibroids She is sexually active. She is currently using birth control pills for contraception.  Her most recent pap smear was obtained 07/15/2018 and was NIL. She has had a recent mammogram 08/16/2018 was benign after additional views and an ultrasound revealed a benign cyst in the left breast. She has another mammogram scheduled for next month. There is a positive history of breast cancer in her paternal aunt. Genetic testing is not indicated. There is no family history of ovarian cancer.  The patient does do occasional self breast exams.  The patient does not smoke.  The patient does have 2 drinks/ weekend The patient does not use illegal drugs.  The patient has been using the elliptical trained 30 min/day for 5 days a week.  The patient does get adequate calcium in her diet.  She has had a recent cholesterol screen July 2019 and it was  normal.  Review of Systems: Review of Systems  Constitutional: Negative for chills, fever and weight loss.  HENT: Negative for congestion (nasal congestion with recent cold), sinus pain and sore throat.   Eyes: Negative for blurred vision and pain.  Respiratory: Negative for cough, hemoptysis, shortness of breath and wheezing.   Cardiovascular: Negative for chest pain, palpitations and leg swelling.  Gastrointestinal: Positive for constipation and diarrhea. Negative for abdominal pain, blood in stool, heartburn, nausea and vomiting.  Genitourinary: Negative for dysuria, frequency, hematuria and urgency.       Positive for BTB  Musculoskeletal: Positive for neck pain (and left shoulder). Negative for back pain, joint pain and myalgias.  Skin: Negative for itching and rash.  Neurological: Positive for headaches (menstrual migraines). Negative for dizziness and tingling.  Endo/Heme/Allergies: Negative for environmental allergies and polydipsia. Does not bruise/bleed easily.       Negative for hirsutism   Psychiatric/Behavioral: Negative for depression. The patient is not nervous/anxious and does not have insomnia.     Past Medical History:  Past Medical History:  Diagnosis Date  . Atopic dermatitis   . Basal cell carcinoma    15 years ago.  Marland Kitchen Hemochromatosis    Heterozygous for two genes  . Palpitations    a. 04/2013 48h Holter: sinus rhythm/sinus arrhythmia, rare PAC's/PVC's.  . Shoulder pain, left 01/24/2015  . Shoulder pain, right 01/24/2015    Past Surgical History:  Past Surgical History:  Procedure Laterality  Date  . Excision of basal cell carcinoma    . WISDOM TOOTH EXTRACTION      Family History:  Family History  Problem Relation Age of Onset  . Hyperlipidemia Maternal Grandfather   . Hypertension Maternal Grandfather   . Diabetes Maternal Grandfather   . Stroke Maternal Grandfather   . Hypertension Father   . Melanoma Father   . Colon polyps Father 59        adenomatous  . Hemochromatosis Mother   . Basal cell carcinoma Mother   . Hyperlipidemia Maternal Grandmother   . Hypertension Maternal Grandmother   . Osteoporosis Maternal Grandmother   . Breast cancer Paternal Aunt   . Kidney cancer Paternal Aunt   . Alzheimer's disease Paternal Aunt   . Alzheimer's disease Paternal Grandmother   . Colon cancer Neg Hx     Social History:  Social History   Socioeconomic History  . Marital status: Married    Spouse name: Not on file  . Number of children: 1  . Years of education: Not on file  . Highest education level: Not on file  Occupational History  . Occupation: Dietitian  Tobacco Use  . Smoking status: Never Smoker  . Smokeless tobacco: Never Used  Vaping Use  . Vaping Use: Never used  Substance and Sexual Activity  . Alcohol use: Yes    Alcohol/week: 2.0 standard drinks    Types: 2 Cans of beer per week    Comment: 1-2 beers a week  . Drug use: No  . Sexual activity: Yes    Partners: Male    Birth control/protection: Pill  Other Topics Concern  . Not on file  Social History Narrative   Married      Daughter -37 yrs.       Dietitian for [employer]   Social Determinants of Health   Financial Resource Strain:   . Difficulty of Paying Living Expenses:   Food Insecurity:   . Worried About Charity fundraiser in the Last Year:   . Arboriculturist in the Last Year:   Transportation Needs:   . Film/video editor (Medical):   Marland Kitchen Lack of Transportation (Non-Medical):   Physical Activity:   . Days of Exercise per Week:   . Minutes of Exercise per Session:   Stress:   . Feeling of Stress :   Social Connections:   . Frequency of Communication with Friends and Family:   . Frequency of Social Gatherings with Friends and Family:   . Attends Religious Services:   . Active Member of Clubs or Organizations:   . Attends Archivist Meetings:   Marland Kitchen Marital Status:   Intimate Partner Violence:   . Fear of Current or  Ex-Partner:   . Emotionally Abused:   Marland Kitchen Physically Abused:   . Sexually Abused:     Allergies:  Allergies  Allergen Reactions  . Cortizone-10 [Hydrocortisone]     Allergic to all topical corticosteriods  . Propylene Glycol     Skin rash    Medications:  Current Outpatient Medications:  .  cholecalciferol (VITAMIN D) 1000 units tablet, Take 1,000 Units by mouth 2 (two) times daily., Disp: , Rfl:  .  ibuprofen (ADVIL,MOTRIN) 200 MG tablet, Take 200 mg by mouth every 6 (six) hours as needed., Disp: , Rfl:  .  levonorgestrel-ethinyl estradiol (NORDETTE) 0.15-30 MG-MCG tablet, Take 1 tablet by mouth daily., Disp: 84 tablet, Rfl: 3 .  propranolol (INDERAL) 20 MG tablet, Take  1 tablet (20 mg total) by mouth daily as needed., Disp: 90 tablet, Rfl: 1 .  ACZONE 7.5 % GEL, , Disp: , Rfl: 2  Vitamin B12 1000 mcg daily   Physical Exam Vitals: BP 116/72   Ht 5\' 6"  (1.676 m)   Wt 134 lb 3.2 oz (60.9 kg)   LMP 07/13/2019   BMI 21.66 kg/m   General: WF in NAD HEENT: normocephalic, anicteric Neck: no thyroid enlargement, no palpable nodules, no cervical lymphadenopathy  Pulmonary: No increased work of breathing, CTAB Cardiovascular: RRR, without murmur  Breast: Breast symmetrical, no tenderness, no palpable nodules or masses, no skin or nipple retraction present, no nipple discharge.  No axillary, infraclavicular or supraclavicular lymphadenopathy. Abdomen: Soft, non-tender, non-distended.  Umbilicus without lesions.  No hepatomegaly or masses palpable. No evidence of hernia. Genitourinary:  External: Normal external female genitalia.  Normal urethral meatus, normal Bartholin's and Skene's glands.    Vagina: Normal vaginal mucosa, no evidence of prolapse.    Cervix: Grossly normal in appearance, small amount of bleeding, non-tender  Uterus: Anteverted, normal size, shape, and consistency, mobile, and non-tender  Adnexa: No adnexal masses, non-tender on right and mild tenderness on  left  Rectal: deferred  Lymphatic: no evidence of inguinal lymphadenopathy Extremities: no edema, erythema, or tenderness Neurologic: Grossly intact Psychiatric: mood appropriate, affect full     Assessment: 44 y.o. presents for annual gyn exam  Plan:   1) Breast cancer screening - recommend monthly self breast exam and annual screening mammograms.. Mammogram is scheduled for July.  2) Cervical cancer screening - Pap was done. Discussed how frequently patient desires Pap smears and she desires them yearly with annual exams.  3) Contraception - Refills for Nordette called in x 1year  4) Routine healthcare maintenance including cholesterol and diabetes screening managed by PCP   5) RTO 1 year and prn.   Dalia Heading, CNM

## 2019-07-22 ENCOUNTER — Encounter: Payer: 59 | Admitting: Family

## 2019-07-24 ENCOUNTER — Encounter: Payer: Self-pay | Admitting: Certified Nurse Midwife

## 2019-07-27 LAB — CYTOLOGY - PAP
Comment: NEGATIVE
Diagnosis: UNDETERMINED — AB
High risk HPV: NEGATIVE

## 2019-07-29 ENCOUNTER — Ambulatory Visit
Admission: EM | Admit: 2019-07-29 | Discharge: 2019-07-29 | Disposition: A | Discharge status: Home / Self Care | Payer: 59 | Attending: Emergency Medicine | Admitting: Emergency Medicine

## 2019-07-29 ENCOUNTER — Telehealth: Payer: Self-pay | Admitting: Family

## 2019-07-29 DIAGNOSIS — Z0000 Encounter for general adult medical examination without abnormal findings: Secondary | ICD-10-CM

## 2019-07-29 DIAGNOSIS — J029 Acute pharyngitis, unspecified: Secondary | ICD-10-CM | POA: Insufficient documentation

## 2019-07-29 DIAGNOSIS — J069 Acute upper respiratory infection, unspecified: Secondary | ICD-10-CM | POA: Diagnosis not present

## 2019-07-29 LAB — POCT RAPID STREP A (OFFICE): Rapid Strep A Screen: NEGATIVE

## 2019-07-29 LAB — POC SARS CORONAVIRUS 2 AG -  ED: SARS Coronavirus 2 Ag: NEGATIVE

## 2019-07-29 NOTE — Discharge Instructions (Addendum)
Your rapid strep test is negative.  A throat culture is pending; we will call you if it is positive requiring treatment.    Your rapid COVID test is negative; the send-out test is pending.  You should self quarantine until your test result is back and is negative.    Go to the emergency department if you develop high fever, shortness of breath, severe diarrhea, or other concerning symptoms.

## 2019-07-29 NOTE — Telephone Encounter (Signed)
Left message to call back  

## 2019-07-29 NOTE — Telephone Encounter (Signed)
Yes Please  I have ordered

## 2019-07-29 NOTE — ED Triage Notes (Signed)
Patient's 43 year old daughter was diagnosed with strep on Monday. Patient reports sore throat x2 days and nasal congestion x1 day. Has gotten a COVID swab from employee health.

## 2019-07-29 NOTE — ED Provider Notes (Signed)
Adrienne Harris    CSN: [id number] Arrival date & time: 07/29/19  0912      History   Chief Complaint Chief Complaint  Patient presents with  . Sore Throat  . Nasal Congestion    HPI Adrienne Harris is a 44 y.o. female.   Patient presents with sore throat, postnasal drip, nasal congestion, nonproductive cough x2 days.  Her daughter was diagnosed with strep 5 days ago.  Patient denies fever, chills, difficulty swallowing, ear pain, abdominal pain, vomiting, diarrhea, rash, or other symptoms.  Treatment attempted at home with Mucinex D.  Patient went for a COVID test this morning through employee health; she is requesting a rapid COVID here because she is going out of town this weekend.  The history is provided by the patient.    Past Medical History:  Diagnosis Date  . Atopic dermatitis   . Basal cell carcinoma    15 years ago.  Marland Kitchen Hemochromatosis    Heterozygous for two genes  . Palpitations    a. 04/2013 48h Holter: sinus rhythm/sinus arrhythmia, rare PAC's/PVC's.  . Shoulder pain, left 01/24/2015  . Shoulder pain, right 01/24/2015    Patient Active Problem List   Diagnosis Date Noted  . Nonallopathic lesion of sacral region 12/30/2017  . Trigger point of left shoulder region 10/06/2017  . Slipped rib syndrome 08/05/2017  . Nonallopathic lesion of rib cage 08/05/2017  . Nonallopathic lesion of cervical region 08/05/2017  . Nonallopathic lesion of thoracic region 08/05/2017  . Nonallopathic lesion of lumbosacral region 08/05/2017  . Chronic left shoulder pain 07/10/2017  . Chronic LLQ pain 05/17/2017  . Atopic dermatitis 07/10/2016  . Hepatic cyst 07/10/2016  . Routine physical examination 07/10/2016  . PVC's (premature ventricular contractions) 04/22/2013  . Hemochromatosis 04/22/2013    Past Surgical History:  Procedure Laterality Date  . Excision of basal cell carcinoma    . WISDOM TOOTH EXTRACTION      OB History    Gravida  1   Para  1   Term  1    Preterm      AB      Living  1     SAB      TAB      Ectopic      Multiple      Live Births  1        Obstetric Comments  In 2019 Adrienne Harris was in 7th grade and is involved with volleyball         Home Medications    Prior to Admission medications   Medication Sig Start Date End Date Taking? Authorizing Provider  cholecalciferol (VITAMIN D) 1000 units tablet Take 1,000 Units by mouth 2 (two) times daily.    [provider]  Cyanocobalamin (VITAMIN B 12 PO) Take 1,000 mcg by mouth daily.    [provider]  ibuprofen (ADVIL,MOTRIN) 200 MG tablet Take 200 mg by mouth every 6 (six) hours as needed.    [provider]  levonorgestrel-ethinyl estradiol (NORDETTE) 0.15-30 MG-MCG tablet Take 1 tablet by mouth daily. 07/21/19   Dalia Heading, CNM  propranolol (INDERAL) 20 MG tablet TAKE 1 TABLET BY MOUTH DAILY AS NEEDED. 03/08/19   [name], FNP    Family History Family History  Problem Relation Age of Onset  . Hyperlipidemia Maternal Grandfather   . Hypertension Maternal Grandfather   . Diabetes Maternal Grandfather   . Stroke Maternal Grandfather   . Hypertension Father   .  Melanoma Father   . Colon polyps Father 15       adenomatous  . Hemochromatosis Mother   . Basal cell carcinoma Mother   . Hyperlipidemia Maternal Grandmother   . Hypertension Maternal Grandmother   . Osteoporosis Maternal Grandmother   . Breast cancer Paternal Aunt   . Kidney cancer Paternal Aunt   . Alzheimer's disease Paternal Aunt   . Alzheimer's disease Paternal Grandmother   . Colon cancer Neg Hx     Social History Social History   Tobacco Use  . Smoking status: Never Smoker  . Smokeless tobacco: Never Used  Vaping Use  . Vaping Use: Never used  Substance Use Topics  . Alcohol use: Yes    Alcohol/week: 2.0 standard drinks    Types: 2 Cans of beer per week    Comment: 1-2 beers a week  . Drug use: No     Allergies   Cortizone-10  [hydrocortisone] and Propylene glycol   Review of Systems Review of Systems  Constitutional: Negative for chills and fever.  HENT: Positive for congestion, postnasal drip, rhinorrhea and sore throat. Negative for ear pain and trouble swallowing.   Eyes: Negative for pain and visual disturbance.  Respiratory: Positive for cough. Negative for shortness of breath.   Cardiovascular: Negative for chest pain and palpitations.  Gastrointestinal: Negative for abdominal pain, diarrhea, nausea and vomiting.  Genitourinary: Negative for dysuria and hematuria.  Musculoskeletal: Negative for arthralgias and back pain.  Skin: Negative for color change and rash.  Neurological: Negative for seizures and syncope.  All other systems reviewed and are negative.    Physical Exam Triage Vital Signs ED Triage Vitals  Enc Vitals Group     BP 07/29/19 0917 (!) 136/92     Pulse Rate 07/29/19 0917 86     Resp 07/29/19 0917 14     Temp 07/29/19 0917 98.6 F (37 C)     Temp Source 07/29/19 0917 Oral     SpO2 07/29/19 0917 99 %     Weight --      Height --      Head Circumference --      Peak Flow --      Pain Score 07/29/19 0913 3     Pain Loc --      Pain Edu? --      Excl. in [place]? --    No data found.  Updated Vital Signs BP (!) 136/92 (BP Location: Left Arm)   Pulse 86   Temp 98.6 F (37 C) (Oral)   Resp 14   LMP 07/13/2019   SpO2 99%   Visual Acuity Right Eye Distance:   Left Eye Distance:   Bilateral Distance:    Right Eye Near:   Left Eye Near:    Bilateral Near:     Physical Exam Vitals and nursing note reviewed.  Constitutional:      General: She is not in acute distress.    Appearance: She is well-developed.  HENT:     Head: Normocephalic and atraumatic.     Right Ear: Tympanic membrane normal.     Left Ear: Tympanic membrane normal.     Nose: Rhinorrhea present.     Mouth/Throat:     Mouth: Mucous membranes are moist.     Pharynx: Posterior oropharyngeal erythema  present. No oropharyngeal exudate.  Eyes:     Conjunctiva/sclera: Conjunctivae normal.  Cardiovascular:     Rate and Rhythm: Normal rate and regular rhythm.  Heart sounds: No murmur heard.   Pulmonary:     Effort: Pulmonary effort is normal. No respiratory distress.     Breath sounds: Normal breath sounds.  Abdominal:     Palpations: Abdomen is soft.     Tenderness: There is no abdominal tenderness. There is no guarding or rebound.  Musculoskeletal:     Cervical back: Neck supple.  Skin:    General: Skin is warm and dry.     Findings: No rash.  Neurological:     General: No focal deficit present.     Mental Status: She is alert and oriented to person, place, and time.     Gait: Gait normal.  Psychiatric:        Mood and Affect: Mood normal.        Behavior: Behavior normal.      UC Treatments / Results  Labs (all labs ordered are listed, but only abnormal results are displayed) Labs Reviewed  CULTURE, GROUP A STREP [facility])  POCT RAPID STREP A (OFFICE)  POC SARS CORONAVIRUS 2 AG -  ED    EKG   Radiology No results found.  Procedures Procedures (including critical care time)  Medications Ordered in UC Medications - No data to display  Initial Impression / Assessment and Plan / UC Course  I have reviewed the triage vital signs and the nursing notes.  Pertinent labs & imaging results that were available during my care of the patient were reviewed by me and considered in my medical decision making (see chart for details).   URI, sore throat.  Rapid strep negative; throat culture pending.  Rapid COVID negative.  PCR COVID done by employee health pending.  Instructed patient to self quarantine until her test result is back.  Discussed Tylenol or ibuprofen as needed for fever or discomfort.  Discussed Mucinex as needed for congestion.  Instructed patient to follow-up with her PCP if her symptoms or not improving.  Patient agrees to plan of care.     Final Clinical  Impressions(s) / UC Diagnoses   Final diagnoses:  Sore throat  Upper respiratory tract infection, unspecified type     Discharge Instructions     Your rapid strep test is negative.  A throat culture is pending; we will call you if it is positive requiring treatment.    Your rapid COVID test is negative; the send-out test is pending.  You should self quarantine until your test result is back and is negative.    Go to the emergency department if you develop high fever, shortness of breath, severe diarrhea, or other concerning symptoms.       ED Prescriptions    None     PDMP not reviewed this encounter.   Sharion Balloon, NP 07/29/19 1000

## 2019-07-29 NOTE — Telephone Encounter (Signed)
Pt would like fasting labs ordered prior to her appt on 09/07/19-please call to schedule lab visit or let me know

## 2019-07-31 LAB — CULTURE, GROUP A STREP (THRC)

## 2019-08-01 ENCOUNTER — Encounter: Payer: 59 | Admitting: Family

## 2019-08-01 ENCOUNTER — Other Ambulatory Visit: Payer: Self-pay | Admitting: Family

## 2019-08-01 NOTE — Telephone Encounter (Signed)
Called patient and scheduled for labs on July 19th at 8:15am. Patient asked if she will be testing her Ferritin as well.

## 2019-08-02 ENCOUNTER — Encounter: Payer: Self-pay | Admitting: Family

## 2019-08-03 ENCOUNTER — Other Ambulatory Visit: Payer: Self-pay | Admitting: Family

## 2019-08-22 ENCOUNTER — Ambulatory Visit
Admission: RE | Admit: 2019-08-22 | Discharge: 2019-08-22 | Disposition: A | Discharge status: Home / Self Care | Payer: 59 | Source: Ambulatory Visit | Attending: Family | Admitting: Family

## 2019-08-22 DIAGNOSIS — Z1231 Encounter for screening mammogram for malignant neoplasm of breast: Secondary | ICD-10-CM | POA: Insufficient documentation

## 2019-08-29 ENCOUNTER — Other Ambulatory Visit (INDEPENDENT_AMBULATORY_CARE_PROVIDER_SITE_OTHER): Payer: 59

## 2019-08-29 ENCOUNTER — Other Ambulatory Visit: Payer: Self-pay

## 2019-08-29 DIAGNOSIS — Z0000 Encounter for general adult medical examination without abnormal findings: Secondary | ICD-10-CM

## 2019-08-29 LAB — COMPREHENSIVE METABOLIC PANEL
ALT: 10 U/L (ref 0–35)
AST: 12 U/L (ref 0–37)
Albumin: 3.8 g/dL (ref 3.5–5.2)
Alkaline Phosphatase: 37 U/L — ABNORMAL LOW (ref 39–117)
BUN: 13 mg/dL (ref 6–23)
CO2: 28 mEq/L (ref 19–32)
Calcium: 8.7 mg/dL (ref 8.4–10.5)
Chloride: 107 mEq/L (ref 96–112)
Creatinine, Ser: 0.71 mg/dL (ref 0.40–1.20)
GFR: 89.59 mL/min (ref >60.00)
Glucose, Bld: 94 mg/dL (ref 70–99)
Potassium: 4 mEq/L (ref 3.5–5.1)
Sodium: 139 mEq/L (ref 135–145)
Total Bilirubin: 0.4 mg/dL (ref 0.2–1.2)
Total Protein: 6.3 g/dL (ref 6.0–8.3)

## 2019-08-29 LAB — HEMOGLOBIN A1C: Hgb A1c MFr Bld: 4.9 % (ref 4.6–6.5)

## 2019-08-29 LAB — IBC + FERRITIN
Ferritin: 77.1 ng/mL (ref 10.0–291.0)
Iron: 179 ug/dL — ABNORMAL HIGH (ref 42–145)
Saturation Ratios: 48.1 % (ref 20.0–50.0)
Transferrin: 266 mg/dL (ref 212.0–360.0)

## 2019-08-29 LAB — CBC WITH DIFFERENTIAL/PLATELET
Basophils Absolute: 0 10*3/uL (ref 0.0–0.1)
Basophils Relative: 0.4 % (ref 0.0–3.0)
Eosinophils Absolute: 0.1 10*3/uL (ref 0.0–0.7)
Eosinophils Relative: 1.7 % (ref 0.0–5.0)
HCT: 39.6 % (ref 36.0–46.0)
Hemoglobin: 13.7 g/dL (ref 12.0–15.0)
Lymphocytes Relative: 36.5 % (ref 12.0–46.0)
Lymphs Abs: 1.7 10*3/uL (ref 0.7–4.0)
MCHC: 34.5 g/dL (ref 30.0–36.0)
MCV: 97.5 fl (ref 78.0–100.0)
Monocytes Absolute: 0.4 10*3/uL (ref 0.1–1.0)
Monocytes Relative: 8.7 % (ref 3.0–12.0)
Neutro Abs: 2.5 10*3/uL (ref 1.4–7.7)
Neutrophils Relative %: 52.7 % (ref 43.0–77.0)
Platelets: 242 10*3/uL (ref 150.0–400.0)
RBC: 4.07 Mil/uL (ref 3.87–5.11)
RDW: 12.3 % (ref 11.5–15.5)
WBC: 4.8 10*3/uL (ref 4.0–10.5)

## 2019-08-29 LAB — B12 AND FOLATE PANEL
Folate: 12.2 ng/mL (ref >5.9)
Vitamin B-12: 340 pg/mL (ref 211–911)

## 2019-08-29 LAB — VITAMIN D 25 HYDROXY (VIT D DEFICIENCY, FRACTURES): VITD: 42.83 ng/mL (ref 30.00–100.00)

## 2019-08-29 LAB — LIPID PANEL
Cholesterol: 147 mg/dL (ref 0–200)
HDL: 51.7 mg/dL (ref >39.00)
LDL Cholesterol: 84 mg/dL (ref 0–99)
NonHDL: 95.57
Total CHOL/HDL Ratio: 3
Triglycerides: 59 mg/dL (ref 0.0–149.0)
VLDL: 11.8 mg/dL (ref 0.0–40.0)

## 2019-08-29 LAB — TSH: TSH: 1.45 u[IU]/mL (ref 0.35–4.50)

## 2019-09-07 ENCOUNTER — Ambulatory Visit (INDEPENDENT_AMBULATORY_CARE_PROVIDER_SITE_OTHER): Payer: 59 | Admitting: Family

## 2019-09-07 ENCOUNTER — Encounter: Payer: Self-pay | Admitting: Family

## 2019-09-07 ENCOUNTER — Other Ambulatory Visit: Payer: Self-pay

## 2019-09-07 ENCOUNTER — Other Ambulatory Visit: Payer: Self-pay | Admitting: Family

## 2019-09-07 VITALS — BP 126/88 | HR 92 | Temp 98.7°F | Ht 65.98 in | Wt 133.4 lb

## 2019-09-07 DIAGNOSIS — Z23 Encounter for immunization: Secondary | ICD-10-CM

## 2019-09-07 DIAGNOSIS — I493 Ventricular premature depolarization: Secondary | ICD-10-CM | POA: Diagnosis not present

## 2019-09-07 DIAGNOSIS — Z0000 Encounter for general adult medical examination without abnormal findings: Secondary | ICD-10-CM

## 2019-09-07 MED ORDER — PROPRANOLOL HCL 20 MG PO TABS: 20.0000 mg | ORAL_TABLET | Freq: Every day | ORAL | 1 refills | Status: DC | PRN

## 2019-09-07 NOTE — Progress Notes (Signed)
Subjective:    Patient ID: Adrienne Harris, female    DOB: October 04, 1975, 44 y.o.   MRN: [id number]  CC: Adrienne Harris is a 44 y.o. female who presents today for physical exam.    HPI: Feels well No complaints    PVCs- compliant with propranolol. PVCs are not more burdensome. Has been helpful for anxiety.   Ferritin < 100. No longer following with Dr Janese Banks  Colorectal Cancer Screening: No colon cancer in the family. Breast Cancer Screening: Mammogram UTD Cervical Cancer Screening: done 07/2019 showed ASC-US; HPV negative. following with [facility], Dalia Heading CMN        Tetanus - due     Hepatitis C screening - Candidate for; will ask to do with next labs  Labs: Screening labs done prior Exercise: Gets regular exercise, ellipital every day Alcohol use: occassional Smoking/tobacco use: Nonsmoker.  Follows with dermatology for history of basal cell carcinoma   HISTORY:  Past Medical History:  Diagnosis Date  . Atopic dermatitis   . Basal cell carcinoma    15 years ago.  Marland Kitchen Hemochromatosis    Heterozygous for two genes  . Palpitations    a. 04/2013 48h Holter: sinus rhythm/sinus arrhythmia, rare PAC's/PVC's.  . Shoulder pain, left 01/24/2015  . Shoulder pain, right 01/24/2015    Past Surgical History:  Procedure Laterality Date  . Excision of basal cell carcinoma    . WISDOM TOOTH EXTRACTION     Family History  Problem Relation Age of Onset  . Hyperlipidemia Maternal Grandfather   . Hypertension Maternal Grandfather   . Diabetes Maternal Grandfather   . Stroke Maternal Grandfather   . Hypertension Father   . Melanoma Father   . Colon polyps Father 75       adenomatous  . Hemochromatosis Mother   . Basal cell carcinoma Mother   . Hyperlipidemia Maternal Grandmother   . Hypertension Maternal Grandmother   . Osteoporosis Maternal Grandmother   . Breast cancer Paternal Aunt   . Kidney cancer Paternal Aunt   . Alzheimer's disease Paternal Aunt   . Alzheimer's  disease Paternal Grandmother   . Colon cancer Neg Hx       ALLERGIES: Cortizone-10 [hydrocortisone] and Propylene glycol  Current Outpatient Medications on File Prior to Visit  Medication Sig Dispense Refill  . cholecalciferol (VITAMIN D) 1000 units tablet Take 1,000 Units by mouth 2 (two) times daily.    . Cyanocobalamin (VITAMIN B 12 PO) Take 1,000 mcg by mouth daily.    Marland Kitchen ibuprofen (ADVIL,MOTRIN) 200 MG tablet Take 200 mg by mouth every 6 (six) hours as needed.    Marland Kitchen levonorgestrel-ethinyl estradiol (NORDETTE) 0.15-30 MG-MCG tablet Take 1 tablet by mouth daily. 84 tablet 3   No current facility-administered medications on file prior to visit.    Social History   Tobacco Use  . Smoking status: Never Smoker  . Smokeless tobacco: Never Used  Vaping Use  . Vaping Use: Never used  Substance Use Topics  . Alcohol use: Yes    Alcohol/week: 2.0 standard drinks    Types: 2 Cans of beer per week    Comment: 1-2 beers a week  . Drug use: No    Review of Systems  Constitutional: Negative for chills, fever and unexpected weight change.  HENT: Negative for congestion.   Respiratory: Negative for cough.   Cardiovascular: Negative for chest pain, palpitations and leg swelling.  Gastrointestinal: Negative for nausea and vomiting.  Musculoskeletal: Negative for arthralgias and  myalgias.  Skin: Negative for rash.  Neurological: Negative for headaches.  Hematological: Negative for adenopathy.  Psychiatric/Behavioral: Negative for confusion.      Objective:    BP (!) 126/88 (BP Location: Left Arm, Patient Position: Sitting)   Pulse 92   Temp 98.7 F (37.1 C)   Ht 5' 5.98" (1.676 m)   Wt 133 lb 6.4 oz (60.5 kg)   LMP 08/10/2019   SpO2 97%   BMI 21.54 kg/m   BP Readings from Last 3 Encounters:  09/07/19 (!) 126/88  07/29/19 (!) 136/92  07/21/19 116/72   Wt Readings from Last 3 Encounters:  09/07/19 133 lb 6.4 oz (60.5 kg)  07/21/19 134 lb 3.2 oz (60.9 kg)  07/16/18 133 lb  12.8 oz (60.7 kg)    Physical Exam Vitals reviewed.  Constitutional:      Appearance: She is well-developed.  Eyes:     Conjunctiva/sclera: Conjunctivae normal.  Neck:     Thyroid: No thyroid mass or thyromegaly.  Cardiovascular:     Rate and Rhythm: Normal rate and regular rhythm.     Pulses: Normal pulses.     Heart sounds: Normal heart sounds.  Pulmonary:     Effort: Pulmonary effort is normal.     Breath sounds: Normal breath sounds. No wheezing, rhonchi or rales.  Chest:     Breasts: Breasts are symmetrical.        Right: No inverted nipple, mass, nipple discharge, skin change or tenderness.        Left: No inverted nipple, mass, nipple discharge, skin change or tenderness.  Lymphadenopathy:     Head:     Right side of head: No submental, submandibular, tonsillar, preauricular, posterior auricular or occipital adenopathy.     Left side of head: No submental, submandibular, tonsillar, preauricular, posterior auricular or occipital adenopathy.     Cervical: No cervical adenopathy.     Right cervical: No superficial, deep or posterior cervical adenopathy.    Left cervical: No superficial, deep or posterior cervical adenopathy.  Skin:    General: Skin is warm and dry.  Neurological:     Mental Status: She is alert.  Psychiatric:        Speech: Speech normal.        Behavior: Behavior normal.        Thought Content: Thought content normal.        Assessment & Plan:   Problem List Items Addressed This Visit      Cardiovascular and Mediastinum   PVC's (premature ventricular contractions)    symptomatically stable on propranolol.  We will continue.      Relevant Medications   propranolol (INDERAL) 20 MG tablet     Other   Hemochromatosis    Ferritin less than 100. advised  repeat in 6 months.  Patient will call me when she wants this lab done.      Routine physical examination    Clinical breast exam performed today.  Deferred pelvic exam in the absence of  complaints and Pap smear is just done.  Pap smear showed ASC-US and have advised patient to call [facility] CMN in to get advice as when she needs to repeat ( suspect one year).  Advised her that I am happy to repeat it here in the office since Jaclyn Shaggy is retiring.       Other Visit Diagnoses    Immunization due    -  Primary   Relevant Orders   Tdap vaccine greater than  or equal to 7yo IM (Completed)       I have changed Josette B. Moser's propranolol. I am also having her maintain her ibuprofen, cholecalciferol, levonorgestrel-ethinyl estradiol, and Cyanocobalamin (VITAMIN B 12 PO).   Meds ordered this encounter  Medications  . propranolol (INDERAL) 20 MG tablet    Sig: Take 1 tablet (20 mg total) by mouth daily as needed.    Dispense:  90 tablet    Refill:  1    Return precautions given.   Risks, benefits, and alternatives of the medications and treatment plan prescribed today were discussed, and patient expressed understanding.   Education regarding symptom management and diagnosis given to patient on AVS.   Continue to follow with [name], FNP for routine health maintenance.   Adrienne Harris and I agreed with plan.   Mable Paris, FNP

## 2019-09-07 NOTE — Assessment & Plan Note (Signed)
symptomatically stable on propranolol.  We will continue.

## 2019-09-07 NOTE — Assessment & Plan Note (Signed)
Clinical breast exam performed today.  Deferred pelvic exam in the absence of complaints and Pap smear is just done.  Pap smear showed ASC-US and have advised patient to call [facility] CMN in to get advice as when she needs to repeat ( suspect one year).  Advised her that I am happy to repeat it here in the office since Jaclyn Shaggy is retiring.

## 2019-09-07 NOTE — Patient Instructions (Addendum)
Call [facility] and double check when your next pap smear should be .. let me know and happy to do here if you would like.  Let me know about when you look at ferritin again and I will order  Nice to see you!   Health Maintenance, Female Adopting a healthy lifestyle and getting preventive care are important in promoting health and wellness. Ask your health care provider about:  The right schedule for you to have regular tests and exams.  Things you can do on your own to prevent diseases and keep yourself healthy. What should I know about diet, weight, and exercise? Eat a healthy diet   Eat a diet that includes plenty of vegetables, fruits, low-fat dairy products, and lean protein.  Do not eat a lot of foods that are high in solid fats, added sugars, or sodium. Maintain a healthy weight Body mass index (BMI) is used to identify weight problems. It estimates body fat based on height and weight. Your health care provider can help determine your BMI and help you achieve or maintain a healthy weight. Get regular exercise Get regular exercise. This is one of the most important things you can do for your health. Most adults should:  Exercise for at least 150 minutes each week. The exercise should increase your heart rate and make you sweat (moderate-intensity exercise).  Do strengthening exercises at least twice a week. This is in addition to the moderate-intensity exercise.  Spend less time sitting. Even light physical activity can be beneficial. Watch cholesterol and blood lipids Have your blood tested for lipids and cholesterol at 44 years of age, then have this test every 5 years. Have your cholesterol levels checked more often if:  Your lipid or cholesterol levels are high.  You are older than 44 years of age.  You are at high risk for heart disease. What should I know about cancer screening? Depending on your health history and family history, you may need to have cancer screening  at various ages. This may include screening for:  Breast cancer.  Cervical cancer.  Colorectal cancer.  Skin cancer.  Lung cancer. What should I know about heart disease, diabetes, and high blood pressure? Blood pressure and heart disease  High blood pressure causes heart disease and increases the risk of stroke. This is more likely to develop in people who have high blood pressure readings, are of African descent, or are overweight.  Have your blood pressure checked: ? Every 3-5 years if you are 44-44 years of age. ? Every year if you are 44 years old or older. Diabetes Have regular diabetes screenings. This checks your fasting blood sugar level. Have the screening done:  Once every three years after age 50 if you are at a normal weight and have a low risk for diabetes.  More often and at a younger age if you are overweight or have a high risk for diabetes. What should I know about preventing infection? Hepatitis B If you have a higher risk for hepatitis B, you should be screened for this virus. Talk with your health care provider to find out if you are at risk for hepatitis B infection. Hepatitis C Testing is recommended for:  Everyone born from [birth year] through 1965.  Anyone with known risk factors for hepatitis C. Sexually transmitted infections (STIs)  Get screened for STIs, including gonorrhea and chlamydia, if: ? You are sexually active and are younger than 44 years of age. ? You are older than 44  years of age and your health care provider tells you that you are at risk for this type of infection. ? Your sexual activity has changed since you were last screened, and you are at increased risk for chlamydia or gonorrhea. Ask your health care provider if you are at risk.  Ask your health care provider about whether you are at high risk for HIV. Your health care provider may recommend a prescription medicine to help prevent HIV infection. If you choose to take medicine to  prevent HIV, you should first get tested for HIV. You should then be tested every 3 months for as long as you are taking the medicine. Pregnancy  If you are about to stop having your period (premenopausal) and you may become pregnant, seek counseling before you get pregnant.  Take 400 to 800 micrograms (mcg) of folic acid every day if you become pregnant.  Ask for birth control (contraception) if you want to prevent pregnancy. Osteoporosis and menopause Osteoporosis is a disease in which the bones lose minerals and strength with aging. This can result in bone fractures. If you are 44 years old or older, or if you are at risk for osteoporosis and fractures, ask your health care provider if you should:  Be screened for bone loss.  Take a calcium or vitamin D supplement to lower your risk of fractures.  Be given hormone replacement therapy (HRT) to treat symptoms of menopause. Follow these instructions at home: Lifestyle  Do not use any products that contain nicotine or tobacco, such as cigarettes, e-cigarettes, and chewing tobacco. If you need help quitting, ask your health care provider.  Do not use street drugs.  Do not share needles.  Ask your health care provider for help if you need support or information about quitting drugs. Alcohol use  Do not drink alcohol if: ? Your health care provider tells you not to drink. ? You are pregnant, may be pregnant, or are planning to become pregnant.  If you drink alcohol: ? Limit how much you use to 0-1 drink a day. ? Limit intake if you are breastfeeding.  Be aware of how much alcohol is in your drink. In the [place] one drink equals one 12 oz bottle of beer (355 mL), one 5 oz glass of wine (148 mL), or one 1 oz glass of hard liquor (44 mL). General instructions  Schedule regular health, dental, and eye exams.  Stay current with your vaccines.  Tell your health care provider if: ? You often feel depressed. ? You have ever been  abused or do not feel safe at home. Summary  Adopting a healthy lifestyle and getting preventive care are important in promoting health and wellness.  Follow your health care provider's instructions about healthy diet, exercising, and getting tested or screened for diseases.  Follow your health care provider's instructions on monitoring your cholesterol and blood pressure. This information is not intended to replace advice given to you by your health care provider. Make sure you discuss any questions you have with your health care provider. Document Revised: 01/20/2018 Document Reviewed: 01/20/2018 Elsevier Patient Education  2020 [employer].

## 2019-09-07 NOTE — Assessment & Plan Note (Signed)
Ferritin less than 100. advised  repeat in 6 months.  Patient will call me when she wants this lab done.

## 2019-10-13 DIAGNOSIS — H5213 Myopia, bilateral: Secondary | ICD-10-CM | POA: Diagnosis not present

## 2019-11-02 IMAGING — MG DIGITAL SCREENING BILATERAL MAMMOGRAM WITH TOMO AND CAD
8 series · 9 of 24 positions shown · non-contrast
Comparison: Previous exam(s).

CLINICAL DATA: Screening.

EXAM:
DIGITAL SCREENING BILATERAL MAMMOGRAM WITH TOMO AND CAD

[L MLO synth-2D]
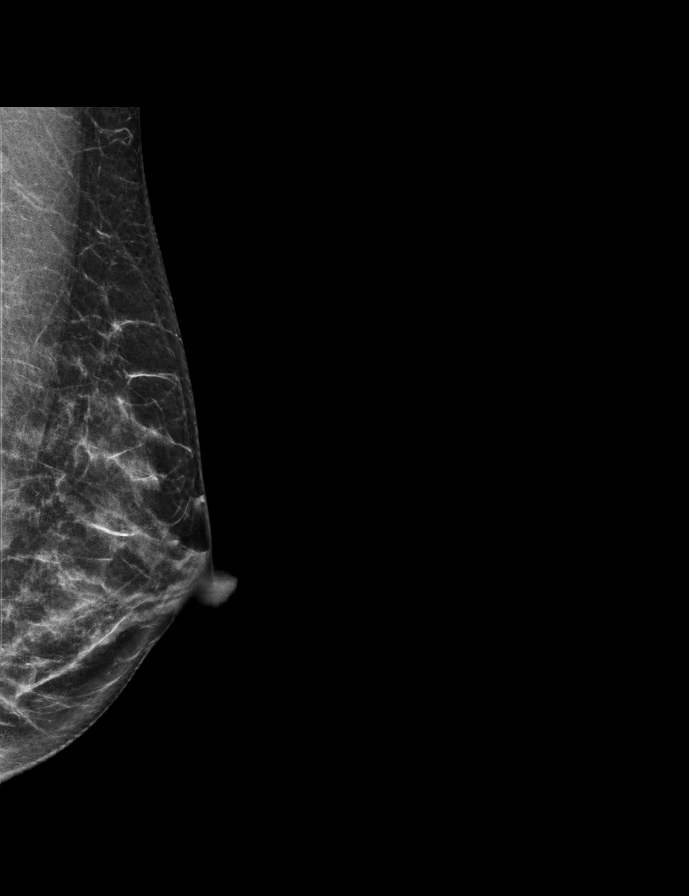

[R CC synth-2D]
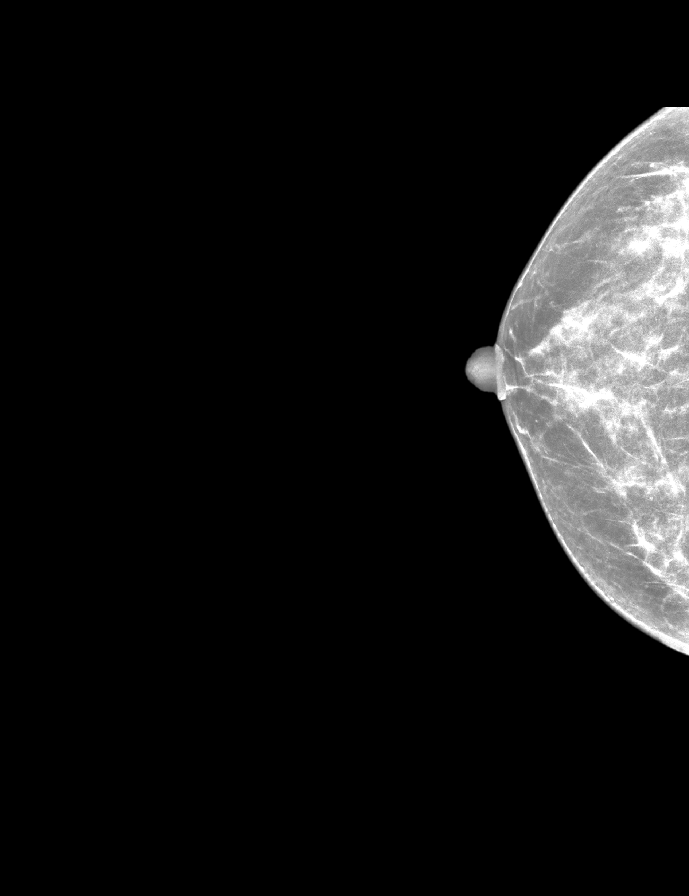

[L CC synth-2D]
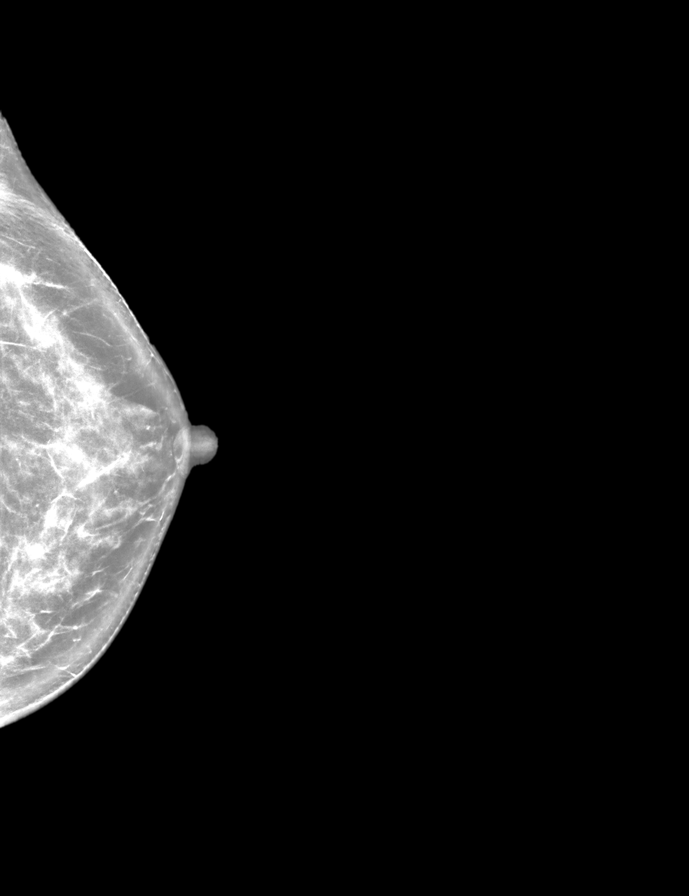

[R MLO synth-2D]
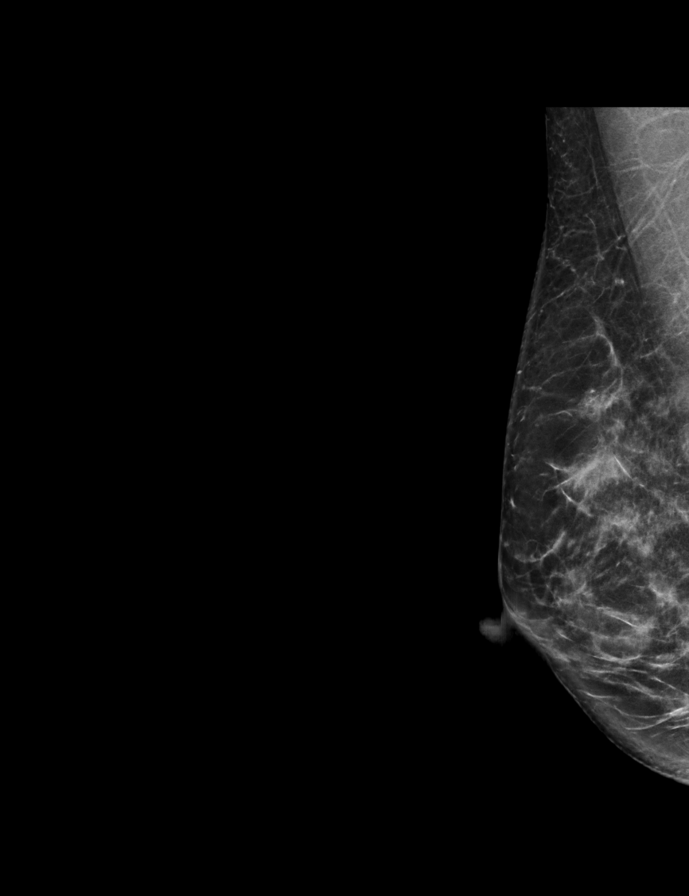

[R CC tomo · 2 of 50 frames shown]
[frame 17/50]
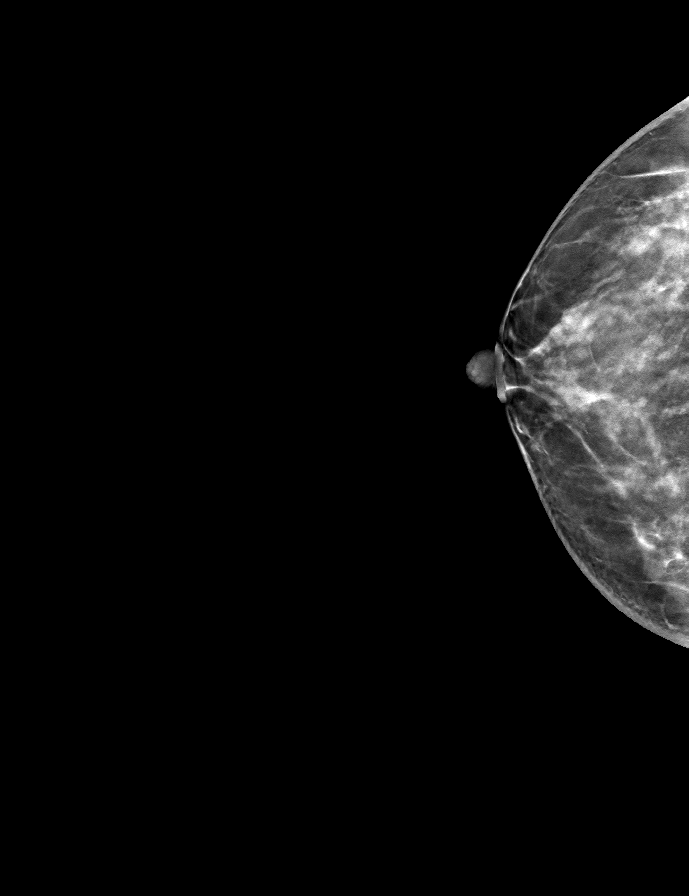
[frame 25/50]
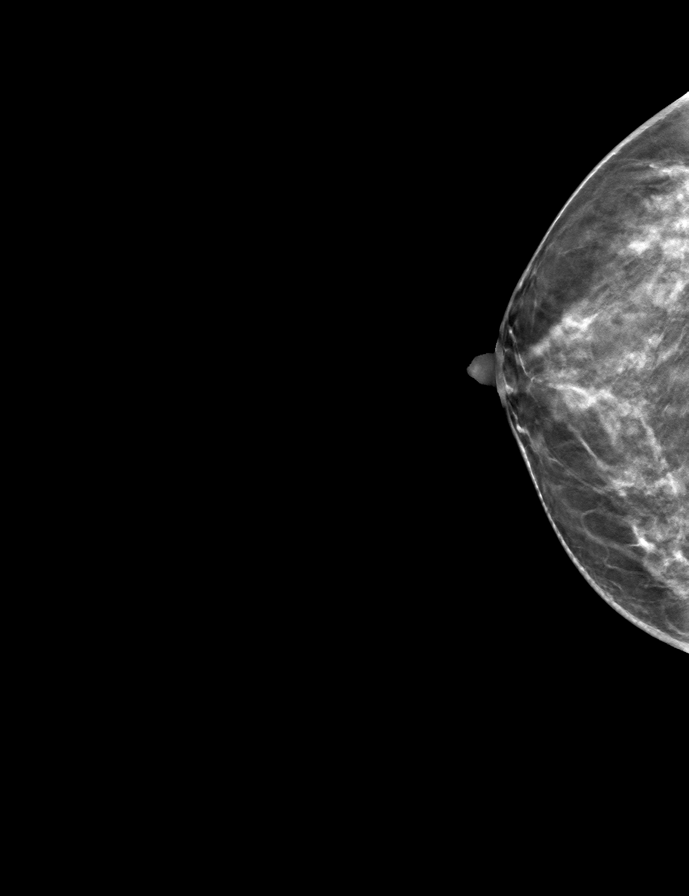

[R MLO tomo · tomo slice 33/66.0]
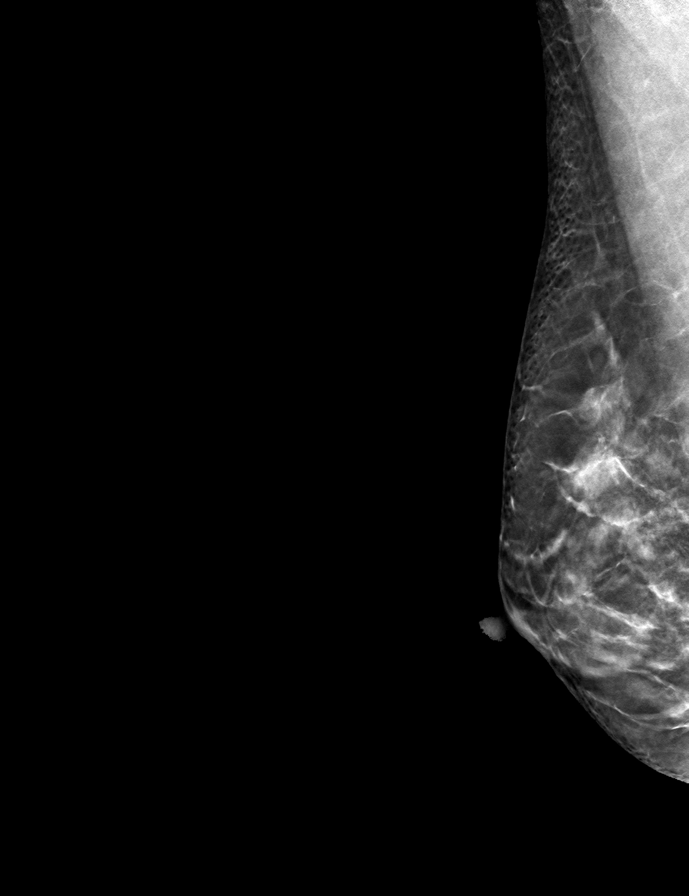

[L CC tomo · tomo slice 35/70.0]
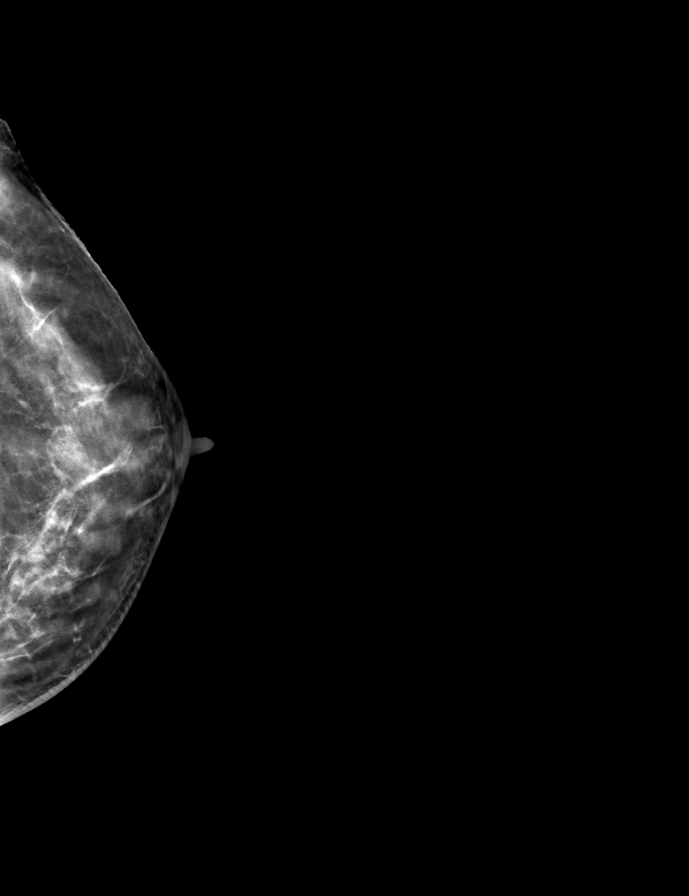

[L MLO tomo · tomo slice 31/62.0]
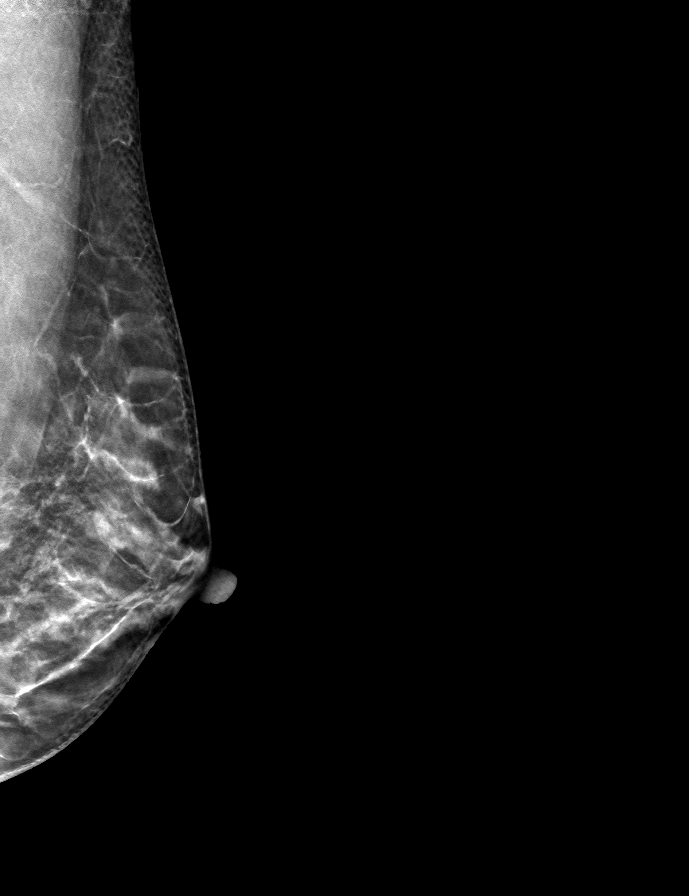

[9 of 24 positions shown; findings below may reference images not displayed]

ACR Breast Density Category c: The breast tissue is heterogeneously
dense, which may obscure small masses.
FINDINGS: In the left breast, a possible asymmetry warrants further
evaluation. In the right breast, no findings suspicious for
malignancy. Images were processed with CAD.
IMPRESSION: Further evaluation is suggested for possible asymmetry in the left
breast.

RECOMMENDATION:
Diagnostic mammogram and possibly ultrasound of the left breast.
(Code:[id number])

The patient will be contacted regarding the findings, and additional
imaging will be scheduled.

BI-RADS CATEGORY  0: Incomplete. Need additional imaging evaluation
and/or prior mammograms for comparison.

## 2019-12-01 DIAGNOSIS — D2262 Melanocytic nevi of left upper limb, including shoulder: Secondary | ICD-10-CM | POA: Diagnosis not present

## 2019-12-01 DIAGNOSIS — B351 Tinea unguium: Secondary | ICD-10-CM | POA: Diagnosis not present

## 2019-12-01 DIAGNOSIS — D225 Melanocytic nevi of trunk: Secondary | ICD-10-CM | POA: Diagnosis not present

## 2019-12-01 DIAGNOSIS — D2261 Melanocytic nevi of right upper limb, including shoulder: Secondary | ICD-10-CM | POA: Diagnosis not present

## 2019-12-01 DIAGNOSIS — L308 Other specified dermatitis: Secondary | ICD-10-CM | POA: Diagnosis not present

## 2019-12-01 DIAGNOSIS — D2271 Melanocytic nevi of right lower limb, including hip: Secondary | ICD-10-CM | POA: Diagnosis not present

## 2019-12-01 DIAGNOSIS — L700 Acne vulgaris: Secondary | ICD-10-CM | POA: Diagnosis not present

## 2019-12-01 DIAGNOSIS — D2272 Melanocytic nevi of left lower limb, including hip: Secondary | ICD-10-CM | POA: Diagnosis not present

## 2020-03-06 ENCOUNTER — Other Ambulatory Visit: Payer: Self-pay | Admitting: Family

## 2020-03-06 DIAGNOSIS — I493 Ventricular premature depolarization: Secondary | ICD-10-CM

## 2020-05-21 ENCOUNTER — Other Ambulatory Visit: Payer: Self-pay

## 2020-05-21 MED FILL — Propranolol HCl Tab 20 MG: ORAL | 90 days supply | Qty: 90 | Fill #0 | Status: AC

## 2020-06-04 ENCOUNTER — Other Ambulatory Visit: Payer: Self-pay

## 2020-06-04 MED FILL — Levonorgestrel & Ethinyl Estradiol Tab 0.15 MG-30 MCG: ORAL | 84 days supply | Qty: 84 | Fill #0 | Status: AC

## 2020-07-10 ENCOUNTER — Other Ambulatory Visit: Payer: Self-pay | Admitting: Family

## 2020-07-10 DIAGNOSIS — Z1231 Encounter for screening mammogram for malignant neoplasm of breast: Secondary | ICD-10-CM

## 2020-07-19 NOTE — Progress Notes (Addendum)
PCP:  [name], FNP   Chief Complaint  Patient presents with   Gynecologic Exam    No concerns     HPI:      [name] is a 45 y.o. G1P1001 whose LMP was Patient's last menstrual period was 07/11/2020 (exact date)., presents today for her annual examination.  Her menses are regular every 28-30 days, lasting 4-5 days on OCPs.  Dysmenorrhea none. She has occas intermenstrual bleeding. Hx of small leio on u/s and menstrual migraines wk of placebo pills. Hasn't tried cont dosing pills for HA. Hx of hemochromatosis so important to have monthly bleeding.    Sex activity: single partner, contraception - OCP (estrogen/progesterone).  Last Pap: 07/21/19 Results were: ASCUS with NEGATIVE high risk HPV   Last mammogram: 08/22/19 Results were: normal--routine follow-up in 12 months. Has appt 7/22 There is a FH of breast cancer in her pat aunt and 2 mat grt aunts, genetic testing not indicated for pt. There is no FH of ovarian cancer. The patient does do self-breast exams.  Tobacco use: The patient denies current or previous tobacco use. Alcohol use: social drinker No drug use.  Exercise: moderately active  She does get adequate calcium and Vitamin D in her diet. Labs with PCP  Past Medical History:  Diagnosis Date   Atopic dermatitis    Basal cell carcinoma    15 years ago.   Hemochromatosis    Heterozygous for two genes   Palpitations    a. 04/2013 48h Holter: sinus rhythm/sinus arrhythmia, rare PAC's/PVC's.   Shoulder pain, left 01/24/2015   Shoulder pain, right 01/24/2015    Past Surgical History:  Procedure Laterality Date   Excision of basal cell carcinoma     WISDOM TOOTH EXTRACTION      Family History  Problem Relation Age of Onset   Hemochromatosis Mother    Basal cell carcinoma Mother    Hypertension Father    Melanoma Father    Colon polyps Father 20       adenomatous   Hyperlipidemia Maternal Grandmother    Hypertension Maternal Grandmother     Osteoporosis Maternal Grandmother    Hyperlipidemia Maternal Grandfather    Hypertension Maternal Grandfather    Diabetes Maternal Grandfather    Stroke Maternal Grandfather    Alzheimer's disease Paternal Grandmother    Breast cancer Paternal Aunt 2   Kidney cancer Paternal Aunt    Alzheimer's disease Paternal Aunt    Breast cancer Other    Breast cancer Other    Colon cancer Neg Hx     Social History   Socioeconomic History   Marital status: Married    Spouse name: Not on file   Number of children: 1   Years of education: Not on file   Highest education level: Not on file  Occupational History   Occupation: Dietitian  Tobacco Use   Smoking status: Never   Smokeless tobacco: Never  Vaping Use   Vaping Use: Never used  Substance and Sexual Activity   Alcohol use: Yes    Alcohol/week: 2.0 standard drinks    Types: 2 Cans of beer per week    Comment: 1-2 beers a week   Drug use: No   Sexual activity: Yes    Partners: Male    Birth control/protection: Pill  Other Topics Concern   Not on file  Social History Narrative   Married      Daughter -16 yrs.  Dietitian for [employer]   Social Determinants of Health   Financial Resource Strain: Not on file  Food Insecurity: Not on file  Transportation Needs: Not on file  Physical Activity: Not on file  Stress: Not on file  Social Connections: Not on file  Intimate Partner Violence: Not on file     Current Outpatient Medications:    cholecalciferol (VITAMIN D) 1000 units tablet, Take 1,000 Units by mouth 2 (two) times daily., Disp: , Rfl:    Cyanocobalamin (VITAMIN B 12 PO), Take 1,000 mcg by mouth daily., Disp: , Rfl:    ibuprofen (ADVIL,MOTRIN) 200 MG tablet, Take 200 mg by mouth every 6 (six) hours as needed., Disp: , Rfl:    levonorgestrel-ethinyl estradiol (AVIANE) 0.1-20 MG-MCG tablet, Take 1 tablet by mouth daily., Disp: 84 tablet, Rfl: 3   propranolol (INDERAL) 20 MG tablet, TAKE 1 TABLET BY MOUTH DAILY AS  NEEDED., Disp: 90 tablet, Rfl: 1     ROS:  Review of Systems  Constitutional:  Negative for fatigue, fever and unexpected weight change.  Respiratory:  Negative for cough, shortness of breath and wheezing.   Cardiovascular:  Negative for chest pain, palpitations and leg swelling.  Gastrointestinal:  Negative for blood in stool, constipation, diarrhea, nausea and vomiting.  Endocrine: Negative for cold intolerance, heat intolerance and polyuria.  Genitourinary:  Negative for dyspareunia, dysuria, flank pain, frequency, genital sores, hematuria, menstrual problem, pelvic pain, urgency, vaginal bleeding, vaginal discharge and vaginal pain.  Musculoskeletal:  Negative for back pain, joint swelling and myalgias.  Skin:  Negative for rash.  Neurological:  Positive for headaches. Negative for dizziness, syncope, light-headedness and numbness.  Hematological:  Negative for adenopathy.  Psychiatric/Behavioral:  Negative for agitation, confusion, sleep disturbance and suicidal ideas. The patient is not nervous/anxious.   BREAST: No symptoms   Objective: BP 120/80   Ht 5\' 6"  (1.676 m)   Wt 139 lb (63 kg)   LMP 07/11/2020 (Exact Date)   BMI 22.44 kg/m    Physical Exam Constitutional:      Appearance: She is well-developed.  Genitourinary:     Vulva normal.     Right Labia: No rash, tenderness or lesions.    Left Labia: No tenderness, lesions or rash.    No vaginal discharge, erythema or tenderness.      Right Adnexa: not tender and no mass present.    Left Adnexa: not tender and no mass present.    No cervical friability or polyp.     Uterus is not enlarged or tender.  Breasts:    Right: No mass, nipple discharge, skin change or tenderness.     Left: No mass, nipple discharge, skin change or tenderness.  Neck:     Thyroid: No thyromegaly.  Cardiovascular:     Rate and Rhythm: Normal rate and regular rhythm.     Heart sounds: Normal heart sounds. No murmur heard. Pulmonary:      Effort: Pulmonary effort is normal.     Breath sounds: Normal breath sounds.  Abdominal:     Palpations: Abdomen is soft.     Tenderness: There is no abdominal tenderness. There is no guarding or rebound.  Musculoskeletal:        General: Normal range of motion.     Cervical back: Normal range of motion.  Lymphadenopathy:     Cervical: No cervical adenopathy.  Neurological:     General: No focal deficit present.     Mental Status: She is alert and oriented to  person, place, and time.     Cranial Nerves: No cranial nerve deficit.  Skin:    General: Skin is warm and dry.  Psychiatric:        Mood and Affect: Mood normal.        Behavior: Behavior normal.        Thought Content: Thought content normal.        Judgment: Judgment normal.  Vitals reviewed.    Assessment/Plan: Encounter for annual routine gynecological examination  Cervical cancer screening - Plan: Cytology - PAP  ASCUS of cervix with negative high risk HPV - Plan: Cytology - PAP; repeat pap today. Will f/u if abn.  Encounter for surveillance of contraceptive pills - Plan: levonorgestrel-ethinyl estradiol (AVIANE) 0.1-20 MG-MCG tablet; change to lower dose OCPs for menstrual migraines to see if helps. If pt doesn't have withdrawal bleed, will increase again to 30 mcg dose (due to hx of hemochromatosis) and try short term cont dosing OCPs. F/u prn.   Encounter for screening mammogram for malignant neoplasm of breast--pt has mammo sched.  Intractable menstrual migraine without status migrainosus--change OCPs to see if sx improve. Can also try cont dosing pills.   Meds ordered this encounter  Medications   levonorgestrel-ethinyl estradiol (AVIANE) 0.1-20 MG-MCG tablet    Sig: Take 1 tablet by mouth daily.    Dispense:  84 tablet    Refill:  3    Order Specific Question:   Supervising Provider    Answer:   Gae Dry [[id number]]             GYN counsel breast self exam, mammography screening, adequate intake  of calcium and vitamin D, diet and exercise     F/U  Return in about 1 year (around 07/30/2021).  Hanne Kegg B. Lorell Thibodaux, PA-C 07/30/2020 9:24 AM

## 2020-07-20 DIAGNOSIS — R87610 Atypical squamous cells of undetermined significance on cytologic smear of cervix (ASC-US): Secondary | ICD-10-CM | POA: Diagnosis not present

## 2020-07-27 ENCOUNTER — Encounter: Payer: Self-pay | Admitting: Family

## 2020-07-30 ENCOUNTER — Other Ambulatory Visit: Payer: Self-pay

## 2020-07-30 ENCOUNTER — Encounter: Payer: Self-pay | Admitting: Obstetrics and Gynecology

## 2020-07-30 ENCOUNTER — Other Ambulatory Visit (HOSPITAL_COMMUNITY)
Admission: RE | Admit: 2020-07-30 | Discharge: 2020-07-30 | Disposition: A | Discharge status: Home / Self Care | Payer: 59 | Source: Ambulatory Visit | Attending: Obstetrics and Gynecology | Admitting: Obstetrics and Gynecology

## 2020-07-30 ENCOUNTER — Ambulatory Visit (INDEPENDENT_AMBULATORY_CARE_PROVIDER_SITE_OTHER): Payer: 59 | Admitting: Obstetrics and Gynecology

## 2020-07-30 VITALS — BP 120/80 | Ht 66.0 in | Wt 139.0 lb

## 2020-07-30 DIAGNOSIS — R87610 Atypical squamous cells of undetermined significance on cytologic smear of cervix (ASC-US): Secondary | ICD-10-CM | POA: Insufficient documentation

## 2020-07-30 DIAGNOSIS — Z1231 Encounter for screening mammogram for malignant neoplasm of breast: Secondary | ICD-10-CM

## 2020-07-30 DIAGNOSIS — G43839 Menstrual migraine, intractable, without status migrainosus: Secondary | ICD-10-CM

## 2020-07-30 DIAGNOSIS — Z3041 Encounter for surveillance of contraceptive pills: Secondary | ICD-10-CM

## 2020-07-30 DIAGNOSIS — Z124 Encounter for screening for malignant neoplasm of cervix: Secondary | ICD-10-CM | POA: Diagnosis not present

## 2020-07-30 DIAGNOSIS — Z01419 Encounter for gynecological examination (general) (routine) without abnormal findings: Secondary | ICD-10-CM

## 2020-07-30 MED ORDER — LEVONORGESTREL-ETHINYL ESTRAD 0.1-20 MG-MCG PO TABS
1.0000 | ORAL_TABLET | Freq: Every day | ORAL | 3 refills | Status: DC
  Filled 2020-07-30: qty 84, 84d supply, fill #0
  Filled 2020-11-11: qty 84, 84d supply, fill #1
  Filled 2021-02-12: qty 84, 84d supply, fill #2
  Filled 2021-05-03: qty 84, 84d supply, fill #3

## 2020-08-01 ENCOUNTER — Other Ambulatory Visit: Payer: Self-pay

## 2020-08-01 DIAGNOSIS — Z0000 Encounter for general adult medical examination without abnormal findings: Secondary | ICD-10-CM

## 2020-08-02 LAB — CYTOLOGY - PAP
Comment: NEGATIVE
Diagnosis: UNDETERMINED — AB
High risk HPV: NEGATIVE

## 2020-08-23 ENCOUNTER — Other Ambulatory Visit: Payer: Self-pay

## 2020-08-23 ENCOUNTER — Ambulatory Visit
Admission: RE | Admit: 2020-08-23 | Discharge: 2020-08-23 | Disposition: A | Discharge status: Home / Self Care | Payer: 59 | Source: Ambulatory Visit | Attending: Family | Admitting: Family

## 2020-08-23 DIAGNOSIS — Z1231 Encounter for screening mammogram for malignant neoplasm of breast: Secondary | ICD-10-CM | POA: Insufficient documentation

## 2020-08-29 ENCOUNTER — Other Ambulatory Visit: Payer: Self-pay

## 2020-08-29 ENCOUNTER — Other Ambulatory Visit: Payer: Self-pay | Admitting: Family

## 2020-08-29 DIAGNOSIS — I493 Ventricular premature depolarization: Secondary | ICD-10-CM

## 2020-08-29 MED ORDER — PROPRANOLOL HCL 20 MG PO TABS
ORAL_TABLET | Freq: Every day | ORAL | 1 refills | Status: DC | PRN
  Filled 2020-08-29: qty 90, 90d supply, fill #0

## 2020-09-07 ENCOUNTER — Encounter: Payer: Self-pay | Admitting: Family

## 2020-09-07 ENCOUNTER — Other Ambulatory Visit: Payer: Self-pay

## 2020-09-07 ENCOUNTER — Ambulatory Visit (INDEPENDENT_AMBULATORY_CARE_PROVIDER_SITE_OTHER): Payer: 59 | Admitting: Family

## 2020-09-07 VITALS — BP 112/62 | HR 78 | Temp 98.2°F | Ht 65.95 in | Wt 136.0 lb

## 2020-09-07 DIAGNOSIS — I493 Ventricular premature depolarization: Secondary | ICD-10-CM

## 2020-09-07 DIAGNOSIS — E538 Deficiency of other specified B group vitamins: Secondary | ICD-10-CM

## 2020-09-07 DIAGNOSIS — Z0000 Encounter for general adult medical examination without abnormal findings: Secondary | ICD-10-CM

## 2020-09-07 LAB — COMPREHENSIVE METABOLIC PANEL
ALT: 15 U/L (ref 0–35)
AST: 15 U/L (ref 0–37)
Albumin: 3.9 g/dL (ref 3.5–5.2)
Alkaline Phosphatase: 53 U/L (ref 39–117)
BUN: 11 mg/dL (ref 6–23)
CO2: 31 mEq/L (ref 19–32)
Calcium: 8.9 mg/dL (ref 8.4–10.5)
Chloride: 103 mEq/L (ref 96–112)
Creatinine, Ser: 0.69 mg/dL (ref 0.40–1.20)
GFR: 105.38 mL/min (ref >60.00)
Glucose, Bld: 90 mg/dL (ref 70–99)
Potassium: 3.9 mEq/L (ref 3.5–5.1)
Sodium: 140 mEq/L (ref 135–145)
Total Bilirubin: 0.3 mg/dL (ref 0.2–1.2)
Total Protein: 6.4 g/dL (ref 6.0–8.3)

## 2020-09-07 LAB — CBC WITH DIFFERENTIAL/PLATELET
Basophils Absolute: 0 10*3/uL (ref 0.0–0.1)
Basophils Relative: 0.5 % (ref 0.0–3.0)
Eosinophils Absolute: 0.1 10*3/uL (ref 0.0–0.7)
Eosinophils Relative: 1.4 % (ref 0.0–5.0)
HCT: 42.4 % (ref 36.0–46.0)
Hemoglobin: 14.3 g/dL (ref 12.0–15.0)
Lymphocytes Relative: 31.9 % (ref 12.0–46.0)
Lymphs Abs: 1.4 10*3/uL (ref 0.7–4.0)
MCHC: 33.6 g/dL (ref 30.0–36.0)
MCV: 98.1 fl (ref 78.0–100.0)
Monocytes Absolute: 0.4 10*3/uL (ref 0.1–1.0)
Monocytes Relative: 10 % (ref 3.0–12.0)
Neutro Abs: 2.5 10*3/uL (ref 1.4–7.7)
Neutrophils Relative %: 56.2 % (ref 43.0–77.0)
Platelets: 240 10*3/uL (ref 150.0–400.0)
RBC: 4.33 Mil/uL (ref 3.87–5.11)
RDW: 12.5 % (ref 11.5–15.5)
WBC: 4.4 10*3/uL (ref 4.0–10.5)

## 2020-09-07 LAB — LIPID PANEL
Cholesterol: 185 mg/dL (ref 0–200)
HDL: 60.6 mg/dL (ref >39.00)
LDL Cholesterol: 113 mg/dL — ABNORMAL HIGH (ref 0–99)
NonHDL: 123.91
Total CHOL/HDL Ratio: 3
Triglycerides: 54 mg/dL (ref 0.0–149.0)
VLDL: 10.8 mg/dL (ref 0.0–40.0)

## 2020-09-07 LAB — HEMOGLOBIN A1C: Hgb A1c MFr Bld: 5.2 % (ref 4.6–6.5)

## 2020-09-07 LAB — VITAMIN D 25 HYDROXY (VIT D DEFICIENCY, FRACTURES): VITD: 46.37 ng/mL (ref 30.00–100.00)

## 2020-09-07 LAB — IBC + FERRITIN
Ferritin: 66.6 ng/mL (ref 10.0–291.0)
Iron: 125 ug/dL (ref 42–145)
Saturation Ratios: 33.3 % (ref 20.0–50.0)
Transferrin: 268 mg/dL (ref 212.0–360.0)

## 2020-09-07 LAB — B12 AND FOLATE PANEL
Folate: 10.2 ng/mL (ref >5.9)
Vitamin B-12: 534 pg/mL (ref 211–911)

## 2020-09-07 LAB — TSH: TSH: 0.81 u[IU]/mL (ref 0.35–5.50)

## 2020-09-07 MED ORDER — PROPRANOLOL HCL 20 MG PO TABS
20.0000 mg | ORAL_TABLET | Freq: Every day | ORAL | 3 refills | Status: DC
  Filled 2020-09-07 – 2020-12-04 (×2): qty 90, 90d supply, fill #0
  Filled 2021-02-12: qty 90, 90d supply, fill #1
  Filled 2021-05-23: qty 90, 90d supply, fill #2
  Filled 2021-09-02: qty 90, 90d supply, fill #3

## 2020-09-07 NOTE — Assessment & Plan Note (Signed)
Patient politely declines clinical breast exam, pelvic exam as she follows with Fraser Din, CMN.  Discussed that she will be 45 years of age in 63 months time.  We agreed to go ahead and place referral for colonoscopy and also endoscopy in setting of GERD.  Encouraged continued exercise Referral to podiatry

## 2020-09-07 NOTE — Progress Notes (Signed)
Subjective:    Patient ID: Adrienne Harris, female    DOB: 02/19/1975, 45 y.o.   MRN: [id number]  CC: Adrienne Harris is a 45 y.o. female who presents today for physical exam.    HPI: Left great toe discolored, thick. She has tried topical antifungals without improvement   She also reports that she from time to time has acid reflux and epigastric burning.  No trouble swallowing or pain with swallowing, constipation     No depression.   History of hemochromatosis- had followed with Dr Janese Banks, last visit 01/2018 History of PVCs-compliant with propranolol 20 mg QD.  She finds medication helpful for PVCs and also anxiety.  She feels better taking every day versus as needed  Mother history of basal cell carcinoma, father history melanoma; she follows with Dr Evorn Gong  Colorectal Cancer Screening: No family h/o colon cancer.  Breast Cancer Screening: Mammogram UTD; strong family history   breast cancer Cervical Cancer Screening: UTD, performed by Fraser Din last month.  Showed ASCUS negative HPV.  Plan to repeat Pap smear in 3 years Bone Health screening/DEXA for 65+: No increased fracture risk. Defer screening at this time.  Lung Cancer Screening: Doesn't have 20 year pack year history and age > 64 years yo 71 years         Tetanus - utd         Labs: Screening labs today. Exercise: Gets regular exercise.   Alcohol use: Occasional Smoking/tobacco use: Nonsmoker.     HISTORY:  Past Medical History:  Diagnosis Date   Atopic dermatitis    Basal cell carcinoma    15 years ago.   Hemochromatosis    Heterozygous for two genes   Palpitations    a. 04/2013 48h Holter: sinus rhythm/sinus arrhythmia, rare PAC's/PVC's.   Shoulder pain, left 01/24/2015   Shoulder pain, right 01/24/2015    Past Surgical History:  Procedure Laterality Date   Excision of basal cell carcinoma     WISDOM TOOTH EXTRACTION     Family History  Problem Relation Age of Onset   Hemochromatosis Mother     Basal cell carcinoma Mother    Hypertension Father    Melanoma Father    Colon polyps Father 30       adenomatous   Hyperlipidemia Maternal Grandmother    Hypertension Maternal Grandmother    Osteoporosis Maternal Grandmother    Hyperlipidemia Maternal Grandfather    Hypertension Maternal Grandfather    Diabetes Maternal Grandfather    Stroke Maternal Grandfather    Alzheimer's disease Paternal Grandmother    Breast cancer Paternal Aunt 57   Kidney cancer Paternal Aunt    Alzheimer's disease Paternal Aunt    Breast cancer Other    Breast cancer Other    Colon cancer Neg Hx       ALLERGIES: Cortizone-10 [hydrocortisone] and Propylene glycol  Current Outpatient Medications on File Prior to Visit  Medication Sig Dispense Refill   cholecalciferol (VITAMIN D) 1000 units tablet Take 1,000 Units by mouth daily.     Cyanocobalamin (VITAMIN B 12 PO) Take 1,000 mcg by mouth daily.     ibuprofen (ADVIL,MOTRIN) 200 MG tablet Take 200 mg by mouth every 6 (six) hours as needed.     levonorgestrel-ethinyl estradiol (AVIANE) 0.1-20 MG-MCG tablet Take 1 tablet by mouth daily. 84 tablet 3   No current facility-administered medications on file prior to visit.    Social History   Tobacco Use   Smoking status: Never  Smokeless tobacco: Never  Vaping Use   Vaping Use: Never used  Substance Use Topics   Alcohol use: Yes    Alcohol/week: 2.0 standard drinks    Types: 2 Cans of beer per week    Comment: 1-2 beers a week   Drug use: No    Review of Systems  Constitutional:  Negative for chills, fever and unexpected weight change.  HENT:  Negative for congestion and trouble swallowing.   Respiratory:  Negative for cough.   Cardiovascular:  Negative for chest pain, palpitations and leg swelling.  Gastrointestinal:  Negative for constipation, nausea and vomiting.  Musculoskeletal:  Negative for arthralgias and myalgias.  Skin:  Negative for rash.  Neurological:  Negative for headaches.   Hematological:  Negative for adenopathy.  Psychiatric/Behavioral:  Negative for confusion.      Objective:    BP 112/62 (BP Location: Left Arm, Patient Position: Sitting, Cuff Size: Large)   Pulse 78   Temp 98.2 F (36.8 C) (Oral)   Ht 5' 5.95" (1.675 m)   Wt 136 lb (61.7 kg)   SpO2 98%   BMI 21.98 kg/m   BP Readings from Last 3 Encounters:  09/07/20 112/62  07/30/20 120/80  09/07/19 (!) 126/88   Wt Readings from Last 3 Encounters:  09/07/20 136 lb (61.7 kg)  07/30/20 139 lb (63 kg)  09/07/19 133 lb 6.4 oz (60.5 kg)    Physical Exam Vitals reviewed.  Constitutional:      Appearance: Normal appearance. She is well-developed.  Eyes:     Conjunctiva/sclera: Conjunctivae normal.  Neck:     Thyroid: No thyroid mass or thyromegaly.  Cardiovascular:     Rate and Rhythm: Normal rate and regular rhythm.     Pulses: Normal pulses.     Heart sounds: Normal heart sounds.  Pulmonary:     Effort: Pulmonary effort is normal.     Breath sounds: Normal breath sounds. No wheezing, rhonchi or rales.  Abdominal:     General: Bowel sounds are normal. There is no distension.     Palpations: Abdomen is soft. Abdomen is not rigid. There is no fluid wave or mass.     Tenderness: There is no abdominal tenderness. There is no guarding or rebound.  Lymphadenopathy:     Head:     Right side of head: No submental, submandibular, tonsillar, preauricular, posterior auricular or occipital adenopathy.     Left side of head: No submental, submandibular, tonsillar, preauricular, posterior auricular or occipital adenopathy.     Cervical: No cervical adenopathy.  Skin:    General: Skin is warm and dry.     Comments: Left great toenail is thickened, brittle and yellow in color  Neurological:     Mental Status: She is alert.  Psychiatric:        Speech: Speech normal.        Behavior: Behavior normal.        Thought Content: Thought content normal.       Assessment & Plan:   Problem List  Items Addressed This Visit       Cardiovascular and Mediastinum   PVC's (premature ventricular contractions)    Chronic, stable.  Continue propranolol 20 mg daily       Relevant Medications   propranolol (INDERAL) 20 MG tablet     Other   Hemochromatosis    Chronic, stable.  Pending ferritin, iron saturation.       Relevant Orders   IBC + Ferritin  Routine physical examination - Primary    Patient politely declines clinical breast exam, pelvic exam as she follows with Fraser Din, CMN.  Discussed that she will be 45 years of age in 13 months time.  We agreed to go ahead and place referral for colonoscopy and also endoscopy in setting of GERD.  Encouraged continued exercise Referral to podiatry       Relevant Orders   VITAMIN D 25 Hydroxy (Vit-D Deficiency, Fractures)   Hemoglobin A1c   TSH   CBC with Differential/Platelet   Comprehensive metabolic panel   Lipid panel   Ambulatory referral to Gastroenterology   Ambulatory referral to Podiatry   Other Visit Diagnoses     Low vitamin B12 level       Relevant Orders   B12 and Folate Panel        I have changed Adrienne Harris's propranolol. I am also having her maintain her ibuprofen, cholecalciferol, Cyanocobalamin (VITAMIN B 12 PO), and levonorgestrel-ethinyl estradiol.   Meds ordered this encounter  Medications   propranolol (INDERAL) 20 MG tablet    Sig: Take 1 tablet (20 mg total) by mouth daily.    Dispense:  90 tablet    Refill:  3    Order Specific Question:   Supervising Provider    Answer:   Crecencio Mc [[id number]]    Return precautions given.   Risks, benefits, and alternatives of the medications and treatment plan prescribed today were discussed, and patient expressed understanding.   Education regarding symptom management and diagnosis given to patient on AVS.   Continue to follow with [name], FNP for routine health maintenance.   Adrienne Harris and I agreed with plan.    Mable Paris, FNP

## 2020-09-07 NOTE — Assessment & Plan Note (Signed)
Chronic, stable.  Continue propranolol 20 mg daily

## 2020-09-07 NOTE — Assessment & Plan Note (Signed)
Chronic, stable.  Pending ferritin, iron saturation.

## 2020-09-07 NOTE — Patient Instructions (Signed)
Referral to podiatry, gastroenterology Let us know if you dont hear back within a week in regards to an appointment being scheduled.  Health Maintenance, Female Adopting a healthy lifestyle and getting preventive care are important in promoting health and wellness. Ask your health care provider about: The right schedule for you to have regular tests and exams. Things you can do on your own to prevent diseases and keep yourself healthy. What should I know about diet, weight, and exercise? Eat a healthy diet  Eat a diet that includes plenty of vegetables, fruits, low-fat dairy products, and lean protein. Do not eat a lot of foods that are high in solid fats, added sugars, or sodium.  Maintain a healthy weight Body mass index (BMI) is used to identify weight problems. It estimates body fat based on height and weight. Your health care provider can help determineyour BMI and help you achieve or maintain a healthy weight. Get regular exercise Get regular exercise. This is one of the most important things you can do for your health. Most adults should: Exercise for at least 150 minutes each week. The exercise should increase your heart rate and make you sweat (moderate-intensity exercise). Do strengthening exercises at least twice a week. This is in addition to the moderate-intensity exercise. Spend less time sitting. Even light physical activity can be beneficial. Watch cholesterol and blood lipids Have your blood tested for lipids and cholesterol at 45 years of age, then havethis test every 5 years. Have your cholesterol levels checked more often if: Your lipid or cholesterol levels are high. You are older than 45 years of age. You are at high risk for heart disease. What should I know about cancer screening? Depending on your health history and family history, you may need to have cancer screening at various ages. This may include screening for: Breast cancer. Cervical cancer. Colorectal  cancer. Skin cancer. Lung cancer. What should I know about heart disease, diabetes, and high blood pressure? Blood pressure and heart disease High blood pressure causes heart disease and increases the risk of stroke. This is more likely to develop in people who have high blood pressure readings, are of African descent, or are overweight. Have your blood pressure checked: Every 3-5 years if you are 39-20 years of age. Every year if you are 51 years old or older. Diabetes Have regular diabetes screenings. This checks your fasting blood sugar level. Have the screening done: Once every three years after age 85 if you are at a normal weight and have a low risk for diabetes. More often and at a younger age if you are overweight or have a high risk for diabetes. What should I know about preventing infection? Hepatitis B If you have a higher risk for hepatitis B, you should be screened for this virus. Talk with your health care provider to find out if you are at risk forhepatitis B infection. Hepatitis C Testing is recommended for: Everyone born from [birth year] through 1965. Anyone with known risk factors for hepatitis C. Sexually transmitted infections (STIs) Get screened for STIs, including gonorrhea and chlamydia, if: You are sexually active and are younger than 45 years of age. You are older than 45 years of age and your health care provider tells you that you are at risk for this type of infection. Your sexual activity has changed since you were last screened, and you are at increased risk for chlamydia or gonorrhea. Ask your health care provider if you are at risk. Ask your  health care provider about whether you are at high risk for HIV. Your health care provider may recommend a prescription medicine to help prevent HIV infection. If you choose to take medicine to prevent HIV, you should first get tested for HIV. You should then be tested every 3 months for as long as you are taking the  medicine. Pregnancy If you are about to stop having your period (premenopausal) and you may become pregnant, seek counseling before you get pregnant. Take 400 to 800 micrograms (mcg) of folic acid every day if you become pregnant. Ask for birth control (contraception) if you want to prevent pregnancy. Osteoporosis and menopause Osteoporosis is a disease in which the bones lose minerals and strength with aging. This can result in bone fractures. If you are 66 years old or older, or if you are at risk for osteoporosis and fractures, ask your health care provider if you should: Be screened for bone loss. Take a calcium or vitamin D supplement to lower your risk of fractures. Be given hormone replacement therapy (HRT) to treat symptoms of menopause. Follow these instructions at home: Lifestyle Do not use any products that contain nicotine or tobacco, such as cigarettes, e-cigarettes, and chewing tobacco. If you need help quitting, ask your health care provider. Do not use street drugs. Do not share needles. Ask your health care provider for help if you need support or information about quitting drugs. Alcohol use Do not drink alcohol if: Your health care provider tells you not to drink. You are pregnant, may be pregnant, or are planning to become pregnant. If you drink alcohol: Limit how much you use to 0-1 drink a day. Limit intake if you are breastfeeding. Be aware of how much alcohol is in your drink. In the [place] one drink equals one 12 oz bottle of beer (355 mL), one 5 oz glass of wine (148 mL), or one 1 oz glass of hard liquor (44 mL). General instructions Schedule regular health, dental, and eye exams. Stay current with your vaccines. Tell your health care provider if: You often feel depressed. You have ever been abused or do not feel safe at home. Summary Adopting a healthy lifestyle and getting preventive care are important in promoting health and wellness. Follow your health  care provider's instructions about healthy diet, exercising, and getting tested or screened for diseases. Follow your health care provider's instructions on monitoring your cholesterol and blood pressure. This information is not intended to replace advice given to you by your health care provider. Make sure you discuss any questions you have with your healthcare provider. Document Revised: 01/20/2018 Document Reviewed: 01/20/2018 Elsevier Patient Education  2022 [employer].

## 2020-09-10 NOTE — Telephone Encounter (Signed)
Please advise 

## 2020-09-12 ENCOUNTER — Telehealth: Payer: Self-pay | Admitting: Family

## 2020-09-12 NOTE — Telephone Encounter (Signed)
Rejection Reason - Other - PATIENT WILL SEE A DIFFERENT PROVIDER - PER INSURANCE 7/29 JG" Adrienne Harris said on Sep 07, 2020 3:59 PM  Msg from [facility] podiatry

## 2020-09-14 ENCOUNTER — Encounter: Payer: Self-pay | Admitting: Family

## 2020-09-14 ENCOUNTER — Other Ambulatory Visit: Payer: Self-pay | Admitting: Family

## 2020-09-14 DIAGNOSIS — L609 Nail disorder, unspecified: Secondary | ICD-10-CM

## 2020-09-14 NOTE — Progress Notes (Signed)
close

## 2020-09-25 ENCOUNTER — Ambulatory Visit (INDEPENDENT_AMBULATORY_CARE_PROVIDER_SITE_OTHER): Payer: 59 | Admitting: Podiatry

## 2020-09-25 ENCOUNTER — Other Ambulatory Visit: Payer: Self-pay

## 2020-09-25 DIAGNOSIS — B351 Tinea unguium: Secondary | ICD-10-CM

## 2020-09-25 NOTE — Progress Notes (Signed)
Subjective:  Patient ID: Adrienne Harris, female    DOB: 10-11-1975,  MRN: [id number]  Chief Complaint  Patient presents with   Nail Problem    Nail fungus for about a year on left hallux has tried OTC treatments     45 y.o. female presents with the above complaint. Pain.  Patient presents with complaint of left hallux nail fungus that has been going on for about a year is progressive gotten worse.  She has tried all the over-the-counter medication.  She has not tried prescription medication.  She also has history of hemochromatosis which can affect the liver.  She is not an ideal candidate for Lamisil therapy.  She would like to discuss other options including laser.  There is no pain associated with it.  She denies any other acute complaints she has not seen her wellness prior to seeing me.   Review of Systems: Negative except as noted in the HPI. Denies N/V/F/Ch.  Past Medical History:  Diagnosis Date   Atopic dermatitis    Basal cell carcinoma    15 years ago.   Hemochromatosis    Heterozygous for two genes   Palpitations    a. 04/2013 48h Holter: sinus rhythm/sinus arrhythmia, rare PAC's/PVC's.   Shoulder pain, left 01/24/2015   Shoulder pain, right 01/24/2015    Current Outpatient Medications:    cholecalciferol (VITAMIN D) 1000 units tablet, Take 1,000 Units by mouth daily., Disp: , Rfl:    Cyanocobalamin (VITAMIN B 12 PO), Take 1,000 mcg by mouth daily., Disp: , Rfl:    ibuprofen (ADVIL,MOTRIN) 200 MG tablet, Take 200 mg by mouth every 6 (six) hours as needed., Disp: , Rfl:    levonorgestrel-ethinyl estradiol (AVIANE) 0.1-20 MG-MCG tablet, Take 1 tablet by mouth daily., Disp: 84 tablet, Rfl: 3   propranolol (INDERAL) 20 MG tablet, Take 1 tablet (20 mg total) by mouth daily., Disp: 90 tablet, Rfl: 3  Social History   Tobacco Use  Smoking Status Never  Smokeless Tobacco Never    Allergies  Allergen Reactions   Cortizone-10 [Hydrocortisone]     Allergic to all topical  corticosteriods   Propylene Glycol     Skin rash   Objective:  There were no vitals filed for this visit. There is no height or weight on file to calculate BMI. Constitutional Well developed. Well nourished.  Vascular Dorsalis pedis pulses palpable bilaterally. Posterior tibial pulses palpable bilaterally. Capillary refill normal to all digits.  No cyanosis or clubbing noted. Pedal hair growth normal.  Neurologic Normal speech. Oriented to person, place, and time. Epicritic sensation to light touch grossly present bilaterally.  Dermatologic Left hallux onychomycosis with thickened elongated dystrophic toenail to the left hallux.  Mild pain on palpation.  Rest of the nails within normal limits  Orthopedic: Normal joint ROM without pain or crepitus bilaterally. No visible deformities. No bony tenderness.   Radiographs: None Assessment:   1. Nail fungus   2. Onychomycosis due to dermatophyte    Plan:  Patient was evaluated and treated and all questions answered.  Left hallux onychomycosis -Educated the patient on the etiology of onychomycosis and various treatment options associated with improving the fungal load.  I explained to the patient that there is 3 treatment options available to treat the onychomycosis including topical, p.o., laser treatment.  Patient elected to undergo laser therapy.  I did start discussed with her in extensive detail the benefits of laser therapy.  Discussed with her takes about 6 or 7 sessions.  She states understanding would like to proceed with that.  She is not an ideal candidate for Lamisil due to hemochromatosis.   No follow-ups on file.

## 2020-10-05 ENCOUNTER — Other Ambulatory Visit: Payer: 59

## 2020-10-18 DIAGNOSIS — H5213 Myopia, bilateral: Secondary | ICD-10-CM | POA: Diagnosis not present

## 2020-10-19 ENCOUNTER — Other Ambulatory Visit: Payer: 59

## 2020-10-29 ENCOUNTER — Other Ambulatory Visit: Payer: Self-pay

## 2020-10-29 ENCOUNTER — Ambulatory Visit (INDEPENDENT_AMBULATORY_CARE_PROVIDER_SITE_OTHER): Payer: 59

## 2020-10-29 DIAGNOSIS — B351 Tinea unguium: Secondary | ICD-10-CM

## 2020-10-29 NOTE — Patient Instructions (Signed)

## 2020-10-29 NOTE — Progress Notes (Signed)
Patient presents today for the 1st laser treatment. Diagnosed with mycotic nail infection by Dr. Posey Pronto.   Toenail most affected left hallux.  All other systems are negative.  Nails were filed thin. Laser therapy was administered to left hallux toenails  and patient tolerated the treatment well. All safety precautions were in place.    Follow up in 4 weeks for laser # 2.

## 2020-11-07 ENCOUNTER — Telehealth: Payer: Self-pay

## 2020-11-07 NOTE — Telephone Encounter (Signed)
Pt. Called to cancel upcoming appt but instead want to schedule colonoscopy

## 2020-11-08 ENCOUNTER — Other Ambulatory Visit: Payer: Self-pay

## 2020-11-08 DIAGNOSIS — Z1211 Encounter for screening for malignant neoplasm of colon: Secondary | ICD-10-CM

## 2020-11-08 MED ORDER — NA SULFATE-K SULFATE-MG SULF 17.5-3.13-1.6 GM/177ML PO SOLN
1.0000 | Freq: Once | ORAL | 0 refills | Status: AC
  Filled 2020-11-08: qty 354, 1d supply, fill #0

## 2020-11-08 NOTE — Telephone Encounter (Signed)
Procedure has been scheduled for 01/24/21.

## 2020-11-08 NOTE — Progress Notes (Signed)
Gastroenterology Pre-Procedure Review  Request Date: 01/24/2021 Requesting Physician: Dr. Bonito Norris  PATIENT REVIEW QUESTIONS: The patient responded to the following health history questions as indicated:    1. Are you having any GI issues? no 2. Do you have a personal history of Polyps? no 3. Do you have a family history of Colon Cancer or Polyps? yes (Father colon polyps) 4. Diabetes Mellitus? no 5. Joint replacements in the past 12 months?no 6. Major health problems in the past 3 months?no 7. Any artificial heart valves, MVP, or defibrillator?no    MEDICATIONS & ALLERGIES:    Patient reports the following regarding taking any anticoagulation/antiplatelet therapy:   Plavix, Coumadin, Eliquis, Xarelto, Lovenox, Pradaxa, Brilinta, or Effient? no Aspirin? no  Patient confirms/reports the following medications:  Current Outpatient Medications  Medication Sig Dispense Refill   cholecalciferol (VITAMIN D) 1000 units tablet Take 1,000 Units by mouth daily.     Cyanocobalamin (VITAMIN B 12 PO) Take 1,000 mcg by mouth daily.     ibuprofen (ADVIL,MOTRIN) 200 MG tablet Take 200 mg by mouth every 6 (six) hours as needed.     levonorgestrel-ethinyl estradiol (AVIANE) 0.1-20 MG-MCG tablet Take 1 tablet by mouth daily. 84 tablet 3   propranolol (INDERAL) 20 MG tablet Take 1 tablet (20 mg total) by mouth daily. 90 tablet 3   No current facility-administered medications for this visit.    Patient confirms/reports the following allergies:  Allergies  Allergen Reactions   Cortizone-10 [Hydrocortisone]     Allergic to all topical corticosteriods   Propylene Glycol     Skin rash    No orders of the defined types were placed in this encounter.   AUTHORIZATION INFORMATION Primary Insurance: 1D#: Group #:  Secondary Insurance: 1D#: Group #:  SCHEDULE INFORMATION: Date: 01/24/21 Time: Location: [facility]

## 2020-11-12 ENCOUNTER — Other Ambulatory Visit: Payer: Self-pay

## 2020-11-26 ENCOUNTER — Other Ambulatory Visit: Payer: Self-pay

## 2020-11-26 ENCOUNTER — Ambulatory Visit (INDEPENDENT_AMBULATORY_CARE_PROVIDER_SITE_OTHER): Payer: Self-pay

## 2020-11-26 DIAGNOSIS — B351 Tinea unguium: Secondary | ICD-10-CM

## 2020-11-26 NOTE — Progress Notes (Signed)
Patient presents today for the 2nd laser treatment. Diagnosed with mycotic nail infection by Dr. Posey Pronto.   Toenail most affected left hallux.  All other systems are negative.  Nails were filed thin. Laser therapy was administered to left hallux toenails  and patient tolerated the treatment well. All safety precautions were in place.    Follow up in 4 weeks for laser # 3.

## 2020-11-27 ENCOUNTER — Ambulatory Visit: Payer: 59 | Admitting: Gastroenterology

## 2020-12-04 ENCOUNTER — Other Ambulatory Visit: Payer: Self-pay

## 2020-12-06 ENCOUNTER — Other Ambulatory Visit: Payer: Self-pay

## 2020-12-06 DIAGNOSIS — D2261 Melanocytic nevi of right upper limb, including shoulder: Secondary | ICD-10-CM | POA: Diagnosis not present

## 2020-12-06 DIAGNOSIS — D485 Neoplasm of uncertain behavior of skin: Secondary | ICD-10-CM | POA: Diagnosis not present

## 2020-12-06 DIAGNOSIS — D2262 Melanocytic nevi of left upper limb, including shoulder: Secondary | ICD-10-CM | POA: Diagnosis not present

## 2020-12-06 DIAGNOSIS — D2271 Melanocytic nevi of right lower limb, including hip: Secondary | ICD-10-CM | POA: Diagnosis not present

## 2020-12-06 DIAGNOSIS — L308 Other specified dermatitis: Secondary | ICD-10-CM | POA: Diagnosis not present

## 2020-12-06 DIAGNOSIS — L538 Other specified erythematous conditions: Secondary | ICD-10-CM | POA: Diagnosis not present

## 2020-12-06 DIAGNOSIS — B078 Other viral warts: Secondary | ICD-10-CM | POA: Diagnosis not present

## 2020-12-06 DIAGNOSIS — R208 Other disturbances of skin sensation: Secondary | ICD-10-CM | POA: Diagnosis not present

## 2020-12-06 DIAGNOSIS — L821 Other seborrheic keratosis: Secondary | ICD-10-CM | POA: Diagnosis not present

## 2020-12-06 DIAGNOSIS — D225 Melanocytic nevi of trunk: Secondary | ICD-10-CM | POA: Diagnosis not present

## 2020-12-06 MED ORDER — TACROLIMUS 0.1 % EX OINT
TOPICAL_OINTMENT | CUTANEOUS | 2 refills | Status: DC
  Filled 2020-12-06: qty 60, 30d supply, fill #0

## 2020-12-06 MED ORDER — DESOXIMETASONE 0.05 % EX OINT
TOPICAL_OINTMENT | CUTANEOUS | 0 refills | Status: DC
  Filled 2020-12-06: qty 60, 30d supply, fill #0

## 2020-12-28 ENCOUNTER — Other Ambulatory Visit: Payer: 59

## 2021-01-07 ENCOUNTER — Other Ambulatory Visit: Payer: Self-pay

## 2021-01-07 ENCOUNTER — Ambulatory Visit (INDEPENDENT_AMBULATORY_CARE_PROVIDER_SITE_OTHER): Payer: 59

## 2021-01-07 DIAGNOSIS — B351 Tinea unguium: Secondary | ICD-10-CM

## 2021-01-07 NOTE — Progress Notes (Signed)
Patient presents today for the 3rd laser treatment. Diagnosed with mycotic nail infection by Dr. Posey Pronto.   Toenail most affected left hallux.  All other systems are negative.  Nails were filed thin. Laser therapy was administered to left hallux toenails  and patient tolerated the treatment well. All safety precautions were in place.    Follow up in 6 weeks for laser # 4.

## 2021-01-11 ENCOUNTER — Telehealth: Payer: Self-pay | Admitting: Gastroenterology

## 2021-01-11 NOTE — Telephone Encounter (Signed)
Patient stated that she's already called on 01/07/2021 and hasn't gotten a returned phone call. Patient wants CMA to return her call, she has a question about taking Birth Control before procedure and other questions.

## 2021-01-14 ENCOUNTER — Other Ambulatory Visit: Payer: Self-pay

## 2021-01-14 NOTE — Telephone Encounter (Signed)
Spoke with pt regarding her inquiring about taking her birth control the day of the colonoscopy. Advised pt this will be okay.

## 2021-01-23 ENCOUNTER — Encounter: Payer: Self-pay | Admitting: Gastroenterology

## 2021-01-24 ENCOUNTER — Ambulatory Visit
Admission: RE | Admit: 2021-01-24 | Discharge: 2021-01-24 | Disposition: A | Discharge status: Home / Self Care | Payer: 59 | Attending: Gastroenterology | Admitting: Gastroenterology

## 2021-01-24 ENCOUNTER — Ambulatory Visit: Payer: 59 | Admitting: Certified Registered"

## 2021-01-24 ENCOUNTER — Encounter: Payer: Self-pay | Admitting: Gastroenterology

## 2021-01-24 ENCOUNTER — Encounter
Admission: RE | Disposition: A | Discharge status: Home / Self Care | Payer: Self-pay | Source: Home / Self Care | Attending: Gastroenterology

## 2021-01-24 DIAGNOSIS — Z1211 Encounter for screening for malignant neoplasm of colon: Secondary | ICD-10-CM

## 2021-01-24 HISTORY — PX: COLONOSCOPY WITH PROPOFOL: SHX5780

## 2021-01-24 LAB — POCT PREGNANCY, URINE: Preg Test, Ur: NEGATIVE

## 2021-01-24 SURGERY — COLONOSCOPY WITH PROPOFOL
Anesthesia: General

## 2021-01-24 MED ORDER — PROPOFOL 10 MG/ML IV BOLUS
INTRAVENOUS | Status: DC | PRN
  Administered 2021-01-24: 70 mg via INTRAVENOUS
  Administered 2021-01-24: 30 mg via INTRAVENOUS

## 2021-01-24 MED ORDER — MIDAZOLAM HCL 2 MG/2ML IJ SOLN
INTRAMUSCULAR | Status: AC
  Filled 2021-01-24: qty 2

## 2021-01-24 MED ORDER — MIDAZOLAM HCL 5 MG/5ML IJ SOLN
INTRAMUSCULAR | Status: DC | PRN
  Administered 2021-01-24: 2 mg via INTRAVENOUS

## 2021-01-24 MED ORDER — SODIUM CHLORIDE 0.9 % IV SOLN: INTRAVENOUS | Status: DC

## 2021-01-24 MED ORDER — LIDOCAINE 2% (20 MG/ML) 5 ML SYRINGE
INTRAMUSCULAR | Status: DC | PRN
  Administered 2021-01-24: 20 mg via INTRAVENOUS

## 2021-01-24 MED ORDER — PROPOFOL 500 MG/50ML IV EMUL
INTRAVENOUS | Status: DC | PRN
  Administered 2021-01-24: 120 ug/kg/min via INTRAVENOUS

## 2021-01-24 NOTE — Transfer of Care (Signed)
Immediate Anesthesia Transfer of Care Note  Patient: Adrienne Harris  Procedure(s) Performed: COLONOSCOPY WITH PROPOFOL  Patient Location: Endoscopy Unit  Anesthesia Type:General  Level of Consciousness: drowsy  Airway & Oxygen Therapy: Patient Spontanous Breathing  Post-op Assessment: Report given to RN and Post -op Vital signs reviewed and stable  Post vital signs: Reviewed  Last Vitals:  Vitals Value Taken Time  BP 98/59 01/24/21 0828  Temp    Pulse 75 01/24/21 0828  Resp 19 01/24/21 0828  SpO2 99 % 01/24/21 0828  Vitals shown include unvalidated device data.  Last Pain:  Vitals:   01/24/21 0735  TempSrc: Temporal  PainSc: 0-No pain         Complications: No notable events documented.

## 2021-01-24 NOTE — Anesthesia Preprocedure Evaluation (Addendum)
Anesthesia Evaluation  Patient identified by MRN, date of birth, ID band Patient awake    Reviewed: Allergy & Precautions, NPO status , Patient's Chart, lab work & pertinent test results, reviewed documented beta blocker date and time   Airway Mallampati: II  TM Distance: >3 FB Neck ROM: Full    Dental no notable dental hx.    Pulmonary neg pulmonary ROS,    Pulmonary exam normal breath sounds clear to auscultation       Cardiovascular Exercise Tolerance: Good Normal cardiovascular exam+ dysrhythmias (rare PAC's/PVC's 2015)  Rhythm:Regular Rate:Normal     Neuro/Psych negative neurological ROS  negative psych ROS   GI/Hepatic negative GI ROS, Neg liver ROS,   Endo/Other  negative endocrine ROS  Renal/GU negative Renal ROS  negative genitourinary   Musculoskeletal negative musculoskeletal ROS (+)   Abdominal   Peds negative pediatric ROS (+)  Hematology  (+) Blood dyscrasia (Hemochromatosis), ,   Anesthesia Other Findings   Reproductive/Obstetrics negative OB ROS                            Anesthesia Physical Anesthesia Plan  ASA: 2  Anesthesia Plan:    Post-op Pain Management:    Induction: Intravenous  PONV Risk Score and Plan: Propofol infusion  Airway Management Planned: Nasal Cannula and Natural Airway  Additional Equipment:   Intra-op Plan:   Post-operative Plan:   Informed Consent: I have reviewed the patients History and Physical, chart, labs and discussed the procedure including the risks, benefits and alternatives for the proposed anesthesia with the patient or authorized representative who has indicated his/her understanding and acceptance.     Dental advisory given  Plan Discussed with: CRNA and Anesthesiologist  Anesthesia Plan Comments:        Anesthesia Quick Evaluation

## 2021-01-24 NOTE — Op Note (Signed)
[facility] Gastroenterology Patient Name: Adrienne Harris Procedure Date: 01/24/2021 8:10 AM MRN: [id number] Account #: [id number] Date of Birth: 1975/11/02 Admit Type: Outpatient Age: 45 Room: [facility] ENDO ROOM 4 Gender: Female Note Status: Finalized Instrument Name: Jasper Riling [id number] Procedure:             Colonoscopy Indications:           Screening for colorectal malignant neoplasm Providers:             [name] MD, MD Referring MD:          Yvetta Coder. Arnett (Referring MD) Medicines:             Propofol per Anesthesia Complications:         No immediate complications. Procedure:             Pre-Anesthesia Assessment:                        - Prior to the procedure, a History and Physical was                         performed, and patient medications and allergies were                         reviewed. The patient's tolerance of previous                         anesthesia was also reviewed. The risks and benefits                         of the procedure and the sedation options and risks                         were discussed with the patient. All questions were                         answered, and informed consent was obtained. Prior                         Anticoagulants: The patient has taken no previous                         anticoagulant or antiplatelet agents. ASA Grade                         Assessment: II - A patient with mild systemic disease.                         After reviewing the risks and benefits, the patient                         was deemed in satisfactory condition to undergo the                         procedure.                        After obtaining informed consent, the colonoscope was  passed under direct vision. Throughout the procedure,                         the patient's blood pressure, pulse, and oxygen                         saturations were monitored continuously. The                          Colonoscope was introduced through the anus and                         advanced to the the cecum, identified by appendiceal                         orifice and ileocecal valve. The colonoscopy was                         performed without difficulty. The patient tolerated                         the procedure well. The quality of the bowel                         preparation was excellent. Findings:      The perianal and digital rectal examinations were normal.      The colon (entire examined portion) appeared normal. Impression:            - The entire examined colon is normal.                        - No specimens collected. Recommendation:        - Discharge patient to home.                        - Resume previous diet.                        - Continue present medications. Procedure Code(s):     --- Professional ---                        [phone], Colonoscopy, flexible; diagnostic, including                         collection of specimen(s) by brushing or washing, when                         performed (separate procedure) Diagnosis Code(s):     --- Professional ---                        Z12.11, Encounter for screening for malignant neoplasm                         of colon CPT copyright 2019 American [employer]. All rights reserved. The codes documented in this report are preliminary and upon coder review may  be revised to meet current compliance requirements. [name] MD, MD 01/24/2021 8:27:06 AM This report has been signed electronically. Number of Addenda: 0 Note Initiated  On: 01/24/2021 8:10 AM Scope Withdrawal Time: 0 hours 6 minutes 0 seconds  Total Procedure Duration: 0 hours 9 minutes 18 seconds  Estimated Blood Loss:  Estimated blood loss: none.      [facility]

## 2021-01-24 NOTE — H&P (Signed)
[name], MD [address] [place], [place] [address] Phone: [phone] Fax : [phone]  Primary Care Physician:  [name], FNP Primary Gastroenterologist:  Dr. Heeney Norris  Pre-Procedure History & Physical: HPI:  Adrienne Harris is a 45 y.o. female is here for a screening colonoscopy.   Past Medical History:  Diagnosis Date   Atopic dermatitis    Basal cell carcinoma    15 years ago.   Hemochromatosis    Heterozygous for two genes   Palpitations    a. 04/2013 48h Holter: sinus rhythm/sinus arrhythmia, rare PAC's/PVC's.   Shoulder pain, left 01/24/2015   Shoulder pain, right 01/24/2015    Past Surgical History:  Procedure Laterality Date   Excision of basal cell carcinoma     WISDOM TOOTH EXTRACTION      Prior to Admission medications   Medication Sig Start Date End Date Taking? Authorizing Provider  cholecalciferol (VITAMIN D) 1000 units tablet Take 1,000 Units by mouth daily.   Yes [provider]  Cyanocobalamin (VITAMIN B 12 PO) Take 1,000 mcg by mouth daily.   Yes [provider]  propranolol (INDERAL) 20 MG tablet Take 1 tablet (20 mg total) by mouth daily. 09/07/20 09/07/21 Yes Arnett, Yvetta Coder, FNP  Desoximetasone (TOPICORT) 0.05 % OINT Apply thin film to affected hands twice daily until clear 12/06/20     ibuprofen (ADVIL,MOTRIN) 200 MG tablet Take 200 mg by mouth every 6 (six) hours as needed.    [provider]  levonorgestrel-ethinyl estradiol (AVIANE) 0.1-20 MG-MCG tablet Take 1 tablet by mouth daily. 2/70/35   Copland, Deirdre Evener, PA-C  tacrolimus (PROTOPIC) 0.1 % ointment Apply to affected areas twice daily as needed 12/06/20       Allergies as of 11/08/2020 - Review Complete 09/25/2020  Allergen Reaction Noted   Cortizone-10 [hydrocortisone]  07/10/2016   Propylene glycol  07/10/2016    Family History  Problem Relation Age of Onset   Hemochromatosis Mother    Basal cell carcinoma Mother     Hypertension Father    Melanoma Father    Colon polyps Father 4       adenomatous   Hyperlipidemia Maternal Grandmother    Hypertension Maternal Grandmother    Osteoporosis Maternal Grandmother    Hyperlipidemia Maternal Grandfather    Hypertension Maternal Grandfather    Diabetes Maternal Grandfather    Stroke Maternal Grandfather    Alzheimer's disease Paternal Grandmother    Breast cancer Paternal Aunt 70   Kidney cancer Paternal Aunt    Alzheimer's disease Paternal Aunt    Breast cancer Other    Breast cancer Other    Colon cancer Neg Hx     Social History   Socioeconomic History   Marital status: Married    Spouse name: Not on file   Number of children: 1   Years of education: Not on file   Highest education level: Not on file  Occupational History   Occupation: Dietitian  Tobacco Use   Smoking status: Never   Smokeless tobacco: Never  Vaping Use   Vaping Use: Never used  Substance and Sexual Activity   Alcohol use: Yes    Alcohol/week: 2.0 standard drinks    Types: 2 Cans of beer per week    Comment: 1-2 beers a week   Drug use: No   Sexual activity: Yes    Partners: Male    Birth control/protection: Pill  Other Topics Concern   Not on file  Social History Narrative   Married      Daughter -8 yrs.       Dietitian for [employer]   Social Determinants of Health   Financial Resource Strain: Not on file  Food Insecurity: Not on file  Transportation Needs: Not on file  Physical Activity: Not on file  Stress: Not on file  Social Connections: Not on file  Intimate Partner Violence: Not on file    Review of Systems: See HPI, otherwise negative ROS  Physical Exam: BP 118/83    Pulse 68    Temp (!) 97.2 F (36.2 C) (Temporal)    Resp 11    Ht 5\' 6"  (1.676 m)    Wt 62.6 kg    SpO2 100%    BMI 22.27 kg/m  General:   Alert,  pleasant and cooperative in NAD Head:  Normocephalic and atraumatic. Neck:  Supple; no masses or thyromegaly. Lungs:  Clear  throughout to auscultation.    Heart:  Regular rate and rhythm. Abdomen:  Soft, nontender and nondistended. Normal bowel sounds, without guarding, and without rebound.   Neurologic:  Alert and  oriented x4;  grossly normal neurologically.  Impression/Plan: Adrienne Harris is now here to undergo a screening colonoscopy.  Risks, benefits, and alternatives regarding colonoscopy have been reviewed with the patient.  Questions have been answered.  All parties agreeable.

## 2021-01-24 NOTE — Anesthesia Postprocedure Evaluation (Signed)
Anesthesia Post Note  Patient: Adrienne Harris  Procedure(s) Performed: COLONOSCOPY WITH PROPOFOL  Patient location during evaluation: Endoscopy Anesthesia Type: General Level of consciousness: awake and alert Pain management: pain level controlled Vital Signs Assessment: post-procedure vital signs reviewed and stable Respiratory status: spontaneous breathing, nonlabored ventilation and respiratory function stable Cardiovascular status: blood pressure returned to baseline and stable Postop Assessment: no apparent nausea or vomiting Anesthetic complications: no   No notable events documented.   Last Vitals:  Vitals:   01/24/21 0838 01/24/21 0848  BP: 114/81 118/83  Pulse: 82 68  Resp: 12 11  Temp:    SpO2: 100% 100%    Last Pain:  Vitals:   01/24/21 0848  TempSrc:   PainSc: 0-No pain                 [place] Ouch

## 2021-01-25 ENCOUNTER — Encounter: Payer: Self-pay | Admitting: Gastroenterology

## 2021-02-10 DIAGNOSIS — Z1371 Encounter for nonprocreative screening for genetic disease carrier status: Secondary | ICD-10-CM

## 2021-02-10 HISTORY — DX: Encounter for nonprocreative screening for genetic disease carrier status: Z13.71

## 2021-02-12 ENCOUNTER — Other Ambulatory Visit: Payer: Self-pay

## 2021-02-14 ENCOUNTER — Other Ambulatory Visit: Payer: Self-pay

## 2021-02-22 ENCOUNTER — Ambulatory Visit (INDEPENDENT_AMBULATORY_CARE_PROVIDER_SITE_OTHER): Payer: 59

## 2021-02-22 ENCOUNTER — Other Ambulatory Visit: Payer: Self-pay

## 2021-02-22 DIAGNOSIS — B351 Tinea unguium: Secondary | ICD-10-CM

## 2021-02-22 NOTE — Progress Notes (Signed)
Patient presents today for the 4th laser treatment. Diagnosed with mycotic nail infection by Dr. Posey Pronto.   Toenail most affected left hallux.  All other systems are negative.  Nails were filed thin. Laser therapy was administered to left hallux toenails  and patient tolerated the treatment well. All safety precautions were in place.    Follow up in 6 weeks for laser # 5.

## 2021-04-05 ENCOUNTER — Other Ambulatory Visit: Payer: Self-pay

## 2021-04-05 ENCOUNTER — Ambulatory Visit (INDEPENDENT_AMBULATORY_CARE_PROVIDER_SITE_OTHER): Payer: 59

## 2021-04-05 DIAGNOSIS — B351 Tinea unguium: Secondary | ICD-10-CM

## 2021-04-05 NOTE — Progress Notes (Signed)
Patient presents today for the 5th laser treatment. Diagnosed with mycotic nail infection by Dr. Posey Pronto.   Toenail most affected left hallux.  All other systems are negative.  Nails were filed thin. Laser therapy was administered to left hallux toenails  and patient tolerated the treatment well. All safety precautions were in place.   The left hallux is slow to grow and has not had much improvement in the appearance. A small amount of new nail growth is noted at today's visit.  Follow up in 8 weeks for laser # 6.

## 2021-04-23 ENCOUNTER — Other Ambulatory Visit: Payer: Self-pay

## 2021-05-03 ENCOUNTER — Other Ambulatory Visit: Payer: Self-pay

## 2021-05-03 ENCOUNTER — Encounter: Payer: Self-pay | Admitting: Obstetrics and Gynecology

## 2021-05-23 ENCOUNTER — Other Ambulatory Visit: Payer: Self-pay

## 2021-05-31 ENCOUNTER — Other Ambulatory Visit: Payer: 59

## 2021-06-07 ENCOUNTER — Ambulatory Visit (INDEPENDENT_AMBULATORY_CARE_PROVIDER_SITE_OTHER): Payer: Self-pay

## 2021-06-07 DIAGNOSIS — B351 Tinea unguium: Secondary | ICD-10-CM

## 2021-06-07 NOTE — Patient Instructions (Signed)

## 2021-06-07 NOTE — Progress Notes (Signed)
Patient presents today for the 6th laser treatment. Diagnosed with mycotic nail infection by Dr. Posey Pronto.  ? ?Toenail most affected left hallux. ? ?All other systems are negative. ? ?Nails were filed thin. Laser therapy was administered to left hallux toenails  and patient tolerated the treatment well. All safety precautions were in place.  ? ?The left hallux is slow to grow and has not had much improvement in the appearance. A small amount of new nail growth is noted at today's visit. ? ?Patient has completed the recommended laser treatments. He will follow up with Dr. Posey Pronto in 3 months to evaluate progress.   ? ? ?

## 2021-07-10 ENCOUNTER — Other Ambulatory Visit: Payer: Self-pay | Admitting: Family

## 2021-07-10 DIAGNOSIS — Z1231 Encounter for screening mammogram for malignant neoplasm of breast: Secondary | ICD-10-CM

## 2021-08-06 ENCOUNTER — Other Ambulatory Visit (HOSPITAL_COMMUNITY)
Admission: RE | Admit: 2021-08-06 | Discharge: 2021-08-06 | Disposition: A | Discharge status: Home / Self Care | Payer: 59 | Source: Ambulatory Visit | Attending: Obstetrics and Gynecology | Admitting: Obstetrics and Gynecology

## 2021-08-06 ENCOUNTER — Other Ambulatory Visit: Payer: Self-pay

## 2021-08-06 ENCOUNTER — Ambulatory Visit (INDEPENDENT_AMBULATORY_CARE_PROVIDER_SITE_OTHER): Payer: 59 | Admitting: Obstetrics and Gynecology

## 2021-08-06 ENCOUNTER — Encounter: Payer: Self-pay | Admitting: Obstetrics and Gynecology

## 2021-08-06 VITALS — BP 122/90 | Ht 66.0 in | Wt 135.0 lb

## 2021-08-06 DIAGNOSIS — R87610 Atypical squamous cells of undetermined significance on cytologic smear of cervix (ASC-US): Secondary | ICD-10-CM | POA: Diagnosis not present

## 2021-08-06 DIAGNOSIS — Z124 Encounter for screening for malignant neoplasm of cervix: Secondary | ICD-10-CM

## 2021-08-06 DIAGNOSIS — Z1151 Encounter for screening for human papillomavirus (HPV): Secondary | ICD-10-CM | POA: Diagnosis not present

## 2021-08-06 DIAGNOSIS — Z1231 Encounter for screening mammogram for malignant neoplasm of breast: Secondary | ICD-10-CM | POA: Diagnosis not present

## 2021-08-06 DIAGNOSIS — Z3041 Encounter for surveillance of contraceptive pills: Secondary | ICD-10-CM | POA: Diagnosis not present

## 2021-08-06 DIAGNOSIS — Z01419 Encounter for gynecological examination (general) (routine) without abnormal findings: Secondary | ICD-10-CM | POA: Diagnosis not present

## 2021-08-06 MED ORDER — LEVONORGESTREL-ETHINYL ESTRAD 0.1-20 MG-MCG PO TABS
1.0000 | ORAL_TABLET | Freq: Every day | ORAL | 3 refills | Status: DC
  Filled 2021-08-06: qty 84, 84d supply, fill #0

## 2021-08-07 ENCOUNTER — Other Ambulatory Visit: Payer: Self-pay

## 2021-08-08 LAB — CYTOLOGY - PAP
Comment: NEGATIVE
Diagnosis: UNDETERMINED — AB
High risk HPV: NEGATIVE

## 2021-08-30 ENCOUNTER — Ambulatory Visit
Admission: RE | Admit: 2021-08-30 | Discharge: 2021-08-30 | Disposition: A | Discharge status: Home / Self Care | Payer: 59 | Source: Ambulatory Visit | Attending: Family | Admitting: Family

## 2021-08-30 DIAGNOSIS — Z1231 Encounter for screening mammogram for malignant neoplasm of breast: Secondary | ICD-10-CM | POA: Insufficient documentation

## 2021-09-02 ENCOUNTER — Other Ambulatory Visit: Payer: Self-pay

## 2021-09-02 ENCOUNTER — Other Ambulatory Visit: Payer: Self-pay | Admitting: Family

## 2021-09-02 DIAGNOSIS — R928 Other abnormal and inconclusive findings on diagnostic imaging of breast: Secondary | ICD-10-CM

## 2021-09-02 DIAGNOSIS — N6489 Other specified disorders of breast: Secondary | ICD-10-CM

## 2021-09-05 ENCOUNTER — Encounter: Payer: Self-pay | Admitting: Podiatry

## 2021-09-05 ENCOUNTER — Ambulatory Visit (INDEPENDENT_AMBULATORY_CARE_PROVIDER_SITE_OTHER): Payer: 59 | Admitting: Podiatry

## 2021-09-05 DIAGNOSIS — L603 Nail dystrophy: Secondary | ICD-10-CM

## 2021-09-05 NOTE — Progress Notes (Signed)
Subjective:  Patient ID: Adrienne Harris, female    DOB: [birth year],  MRN: [id number]  Chief Complaint  Patient presents with   Nail Problem    Fungal nails laser treatment follow up     46 y.o. female presents with the above complaint.  Patient presents with left hallux dystrophic/mycotic nail.  She states that she feels laser.  The nail fungus is still there.  She would like to have the nail removed.  She denies any other acute complaint she is not a candidate for Lamisil.   Review of Systems: Negative except as noted in the HPI. Denies N/V/F/Ch.  Past Medical History:  Diagnosis Date   Atopic dermatitis    Basal cell carcinoma    15 years ago.   Hemochromatosis    Heterozygous for two genes   Palpitations    a. 04/2013 48h Holter: sinus rhythm/sinus arrhythmia, rare PAC's/PVC's.   Shoulder pain, left 01/24/2015   Shoulder pain, right 01/24/2015    Current Outpatient Medications:    cholecalciferol (VITAMIN D) 1000 units tablet, Take 1,000 Units by mouth daily., Disp: , Rfl:    Cyanocobalamin (VITAMIN B 12 PO), Take 1,000 mcg by mouth daily., Disp: , Rfl:    ibuprofen (ADVIL,MOTRIN) 200 MG tablet, Take 200 mg by mouth every 6 (six) hours as needed., Disp: , Rfl:    levonorgestrel-ethinyl estradiol (AVIANE) 0.1-20 MG-MCG tablet, Take 1 tablet by mouth daily., Disp: 84 tablet, Rfl: 3   propranolol (INDERAL) 20 MG tablet, Take 1 tablet (20 mg total) by mouth daily., Disp: 90 tablet, Rfl: 3   tacrolimus (PROTOPIC) 0.1 % ointment, Apply to affected areas twice daily as needed, Disp: 60 g, Rfl: 2  Social History   Tobacco Use  Smoking Status Never  Smokeless Tobacco Never    Allergies  Allergen Reactions   Cortizone-10 [Hydrocortisone]     Allergic to all topical corticosteriods   Propylene Glycol     Skin rash   Objective:  There were no vitals filed for this visit. There is no height or weight on file to calculate BMI. Constitutional Well developed. Well nourished.   Vascular Dorsalis pedis pulses palpable bilaterally. Posterior tibial pulses palpable bilaterally. Capillary refill normal to all digits.  No cyanosis or clubbing noted. Pedal hair growth normal.  Neurologic Normal speech. Oriented to person, place, and time. Epicritic sensation to light touch grossly present bilaterally.  Dermatologic Pain on palpation of the entire/total nail on 1st digit of the left No other open wounds. No skin lesions.  Orthopedic: Normal joint ROM without pain or crepitus bilaterally. No visible deformities. No bony tenderness.   Radiographs: None Assessment:   1. Nail dystrophy    Plan:  Patient was evaluated and treated and all questions answered.  Nail contusion/dystrophy hallux, left -Patient elects to proceed with minor surgery to remove entire toenail today. Consent reviewed and signed by patient. -Entire/total nail excised. See procedure note. -Educated on post-procedure care including soaking. Written instructions provided and reviewed. -Patient to follow up in 2 weeks for nail check.  Procedure: Excision of entire/total nail  Location: Left 1st toe digit Anesthesia: Lidocaine 1% plain; 1.5 mL and Marcaine 0.5% plain; 1.5 mL, digital block. Skin Prep: Betadine. Dressing: Silvadene; telfa; dry, sterile, compression dressing. Technique: Following skin prep, the toe was exsanguinated and a tourniquet was secured at the base of the toe. The affected nail border was freed and excised. The tourniquet was then removed and sterile dressing applied. Disposition: Patient tolerated procedure well.  Patient to return in 2 weeks for follow-up.   No follow-ups on file.

## 2021-09-09 ENCOUNTER — Other Ambulatory Visit: Payer: Self-pay

## 2021-09-09 ENCOUNTER — Ambulatory Visit (INDEPENDENT_AMBULATORY_CARE_PROVIDER_SITE_OTHER): Payer: 59 | Admitting: Family

## 2021-09-09 ENCOUNTER — Encounter: Payer: Self-pay | Admitting: Family

## 2021-09-09 VITALS — BP 126/76 | HR 86 | Temp 98.5°F | Ht 66.0 in | Wt 135.2 lb

## 2021-09-09 DIAGNOSIS — I493 Ventricular premature depolarization: Secondary | ICD-10-CM | POA: Diagnosis not present

## 2021-09-09 DIAGNOSIS — Z0000 Encounter for general adult medical examination without abnormal findings: Secondary | ICD-10-CM | POA: Diagnosis not present

## 2021-09-09 LAB — LIPID PANEL
Cholesterol: 182 mg/dL (ref 0–200)
HDL: 66.3 mg/dL (ref >39.00)
LDL Cholesterol: 98 mg/dL (ref 0–99)
NonHDL: 116.08
Total CHOL/HDL Ratio: 3
Triglycerides: 92 mg/dL (ref 0.0–149.0)
VLDL: 18.4 mg/dL (ref 0.0–40.0)

## 2021-09-09 LAB — CBC WITH DIFFERENTIAL/PLATELET
Basophils Absolute: 0 10*3/uL (ref 0.0–0.1)
Basophils Relative: 0.9 % (ref 0.0–3.0)
Eosinophils Absolute: 0.1 10*3/uL (ref 0.0–0.7)
Eosinophils Relative: 1.5 % (ref 0.0–5.0)
HCT: 41.5 % (ref 36.0–46.0)
Hemoglobin: 13.9 g/dL (ref 12.0–15.0)
Lymphocytes Relative: 35.7 % (ref 12.0–46.0)
Lymphs Abs: 1.6 10*3/uL (ref 0.7–4.0)
MCHC: 33.5 g/dL (ref 30.0–36.0)
MCV: 100.2 fl — ABNORMAL HIGH (ref 78.0–100.0)
Monocytes Absolute: 0.4 10*3/uL (ref 0.1–1.0)
Monocytes Relative: 8.6 % (ref 3.0–12.0)
Neutro Abs: 2.5 10*3/uL (ref 1.4–7.7)
Neutrophils Relative %: 53.3 % (ref 43.0–77.0)
Platelets: 248 10*3/uL (ref 150.0–400.0)
RBC: 4.14 Mil/uL (ref 3.87–5.11)
RDW: 12.6 % (ref 11.5–15.5)
WBC: 4.6 10*3/uL (ref 4.0–10.5)

## 2021-09-09 LAB — COMPREHENSIVE METABOLIC PANEL
ALT: 13 U/L (ref 0–35)
AST: 16 U/L (ref 0–37)
Albumin: 4 g/dL (ref 3.5–5.2)
Alkaline Phosphatase: 52 U/L (ref 39–117)
BUN: 11 mg/dL (ref 6–23)
CO2: 30 mEq/L (ref 19–32)
Calcium: 8.8 mg/dL (ref 8.4–10.5)
Chloride: 103 mEq/L (ref 96–112)
Creatinine, Ser: 0.72 mg/dL (ref 0.40–1.20)
GFR: 100.81 mL/min (ref >60.00)
Glucose, Bld: 80 mg/dL (ref 70–99)
Potassium: 3.9 mEq/L (ref 3.5–5.1)
Sodium: 140 mEq/L (ref 135–145)
Total Bilirubin: 0.5 mg/dL (ref 0.2–1.2)
Total Protein: 6.5 g/dL (ref 6.0–8.3)

## 2021-09-09 LAB — HEMOGLOBIN A1C: Hgb A1c MFr Bld: 5 % (ref 4.6–6.5)

## 2021-09-09 LAB — IBC + FERRITIN
Ferritin: 75.2 ng/mL (ref 10.0–291.0)
Iron: 195 ug/dL — ABNORMAL HIGH (ref 42–145)
Saturation Ratios: 53.2 % — ABNORMAL HIGH (ref 20.0–50.0)
TIBC: 366.8 ug/dL (ref 250.0–450.0)
Transferrin: 262 mg/dL (ref 212.0–360.0)

## 2021-09-09 LAB — VITAMIN D 25 HYDROXY (VIT D DEFICIENCY, FRACTURES): VITD: 32.8 ng/mL (ref 30.00–100.00)

## 2021-09-09 LAB — B12 AND FOLATE PANEL
Folate: 10.1 ng/mL (ref >5.9)
Vitamin B-12: 386 pg/mL (ref 211–911)

## 2021-09-09 LAB — TSH: TSH: 0.96 u[IU]/mL (ref 0.35–5.50)

## 2021-09-09 MED ORDER — PROPRANOLOL HCL 20 MG PO TABS
20.0000 mg | ORAL_TABLET | Freq: Every day | ORAL | 3 refills | Status: DC
  Filled 2021-09-09 – 2021-12-02 (×2): qty 90, 90d supply, fill #0
  Filled 2022-03-10: qty 90, 90d supply, fill #1
  Filled 2022-06-12: qty 90, 90d supply, fill #2
  Filled 2022-09-07: qty 90, 90d supply, fill #3

## 2021-09-09 NOTE — Progress Notes (Signed)
Had PAP at [facility]

## 2021-09-09 NOTE — Progress Notes (Signed)
Subjective:    Patient ID: Adrienne Harris, female    DOB: 03-28-1975, 46 y.o.   MRN: [id number]  CC: Adrienne Harris is a 46 y.o. female who presents today for physical exam.    HPI: Feels well today.  No new complaints   H/o BCC- follows with Dr Evorn Gong  She is compliant propranolol 20 mg once daily and finds this to be helpful for occasional PVCs, anxiety.  She does not feel dose needs to be changed or increase at this time  Colorectal Cancer Screening: UTD ; no polyps.  No family history colon cancer.  She believes she had a letter to come back in 10 years ( no letter in chart) Breast Cancer Screening: Diagnostic mammogram scheduled Cervical Cancer Screening: Done with GYN, [facility] 08/06/2021 negative HPV, showed ASC-US  Bone Health screening/DEXA for 65+: No increased fracture risk. Defer screening at this time.  Lung Cancer Screening: Doesn't have 20 year pack year history and age > 67 years yo 46 years        Tetanus - UTD         Labs: Screening labs today. Exercise: Gets regular exercise 4-5 days per week.  Alcohol use:  Occassional Smoking/tobacco use: Nonsmoker.     HISTORY:  Past Medical History:  Diagnosis Date   Atopic dermatitis    Basal cell carcinoma    15 years ago.   Hemochromatosis    Heterozygous for two genes   Palpitations    a. 04/2013 48h Holter: sinus rhythm/sinus arrhythmia, rare PAC's/PVC's.   Shoulder pain, left 01/24/2015   Shoulder pain, right 01/24/2015    Past Surgical History:  Procedure Laterality Date   COLONOSCOPY WITH PROPOFOL N/A 01/24/2021   Procedure: COLONOSCOPY WITH PROPOFOL;  Surgeon: [name], MD;  Location: [facility] ENDOSCOPY;  Service: Endoscopy;  Laterality: N/A;   Excision of basal cell carcinoma     WISDOM TOOTH EXTRACTION     Family History  Problem Relation Age of Onset   Hemochromatosis Mother    Basal cell carcinoma Mother    Hypertension Father    Melanoma Father    Colon polyps Father 43       adenomatous    Breast cancer Paternal Aunt 93   Kidney cancer Paternal Aunt    Alzheimer's disease Paternal Aunt    Hyperlipidemia Maternal Grandmother    Hypertension Maternal Grandmother    Osteoporosis Maternal Grandmother    Hyperlipidemia Maternal Grandfather    Hypertension Maternal Grandfather    Diabetes Maternal Grandfather    Stroke Maternal Grandfather    Alzheimer's disease Paternal Grandmother    Breast cancer Other        late years   Breast cancer Other        late years   Colon cancer Neg Hx       ALLERGIES: Cortizone-10 [hydrocortisone] and Propylene glycol  Current Outpatient Medications on File Prior to Visit  Medication Sig Dispense Refill   cholecalciferol (VITAMIN D) 1000 units tablet Take 1,000 Units by mouth daily.     Cyanocobalamin (VITAMIN B 12 PO) Take 1,000 mcg by mouth daily.     ibuprofen (ADVIL,MOTRIN) 200 MG tablet Take 200 mg by mouth every 6 (six) hours as needed.     levonorgestrel-ethinyl estradiol (AVIANE) 0.1-20 MG-MCG tablet Take 1 tablet by mouth daily. 84 tablet 3   propranolol (INDERAL) 20 MG tablet Take 1 tablet (20 mg total) by mouth daily. 90 tablet 3   tacrolimus (PROTOPIC) 0.1 %  ointment Apply to affected areas twice daily as needed 60 g 2   No current facility-administered medications on file prior to visit.    Social History   Tobacco Use   Smoking status: Never   Smokeless tobacco: Never  Vaping Use   Vaping Use: Never used  Substance Use Topics   Alcohol use: Yes    Alcohol/week: 2.0 standard drinks of alcohol    Types: 2 Cans of beer per week    Comment: 1-2 beers a week   Drug use: No    Review of Systems  Constitutional:  Negative for chills, fever and unexpected weight change.  HENT:  Negative for congestion.   Respiratory:  Negative for cough.   Cardiovascular:  Negative for chest pain, palpitations and leg swelling.  Gastrointestinal:  Negative for nausea and vomiting.  Musculoskeletal:  Negative for arthralgias and  myalgias.  Skin:  Negative for rash.  Neurological:  Negative for headaches.  Hematological:  Negative for adenopathy.  Psychiatric/Behavioral:  Negative for confusion.       Objective:    BP 126/76 (BP Location: Left Arm, Patient Position: Sitting, Cuff Size: Normal)   Pulse 86   Temp 98.5 F (36.9 C) (Oral)   Ht '5\' 6"'$  (1.676 m)   Wt 135 lb 3.2 oz (61.3 kg)   LMP 08/13/2021   SpO2 99%   BMI 21.82 kg/m   BP Readings from Last 3 Encounters:  09/09/21 126/76  08/06/21 122/90  01/24/21 118/83   Wt Readings from Last 3 Encounters:  09/09/21 135 lb 3.2 oz (61.3 kg)  08/06/21 135 lb (61.2 kg)  01/24/21 138 lb (62.6 kg)    Physical Exam Vitals reviewed.  Constitutional:      Appearance: Normal appearance. She is well-developed.  Eyes:     Conjunctiva/sclera: Conjunctivae normal.  Neck:     Thyroid: No thyroid mass or thyromegaly.  Cardiovascular:     Rate and Rhythm: Normal rate and regular rhythm.     Pulses: Normal pulses.     Heart sounds: Normal heart sounds.  Pulmonary:     Effort: Pulmonary effort is normal.     Breath sounds: Normal breath sounds. No wheezing, rhonchi or rales.  Chest:  Breasts:    Breasts are symmetrical.     Right: No inverted nipple, mass, nipple discharge, skin change or tenderness.     Left: No inverted nipple, mass, nipple discharge, skin change or tenderness.  Abdominal:     General: Bowel sounds are normal. There is no distension.     Palpations: Abdomen is soft. Abdomen is not rigid. There is no fluid wave or mass.     Tenderness: There is no abdominal tenderness. There is no guarding or rebound.  Lymphadenopathy:     Head:     Right side of head: No submental, submandibular, tonsillar, preauricular, posterior auricular or occipital adenopathy.     Left side of head: No submental, submandibular, tonsillar, preauricular, posterior auricular or occipital adenopathy.     Cervical: No cervical adenopathy.     Right cervical: No  superficial, deep or posterior cervical adenopathy.    Left cervical: No superficial, deep or posterior cervical adenopathy.  Skin:    General: Skin is warm and dry.  Neurological:     Mental Status: She is alert.  Psychiatric:        Speech: Speech normal.        Behavior: Behavior normal.        Thought Content: Thought  content normal.        Assessment & Plan:   Problem List Items Addressed This Visit       Cardiovascular and Mediastinum   PVC's (premature ventricular contractions)    Chronic, stable.  Continue propranolol 20 mg once daily as also helpful for anxiety.  We did discuss this medication typically dosed twice daily or 3 times daily.  She does not feel medication needs to be changed at this time.  She will certainly monitor her symptoms.        Other   Routine physical examination    Clinical breast exam performed today.  Mammogram is scheduled.  Patient will confirm when colonoscopy is to be repeated by contacting [facility] to ensure that it is 10 years.  Encouraged continued exercise.        I am having Adrienne Harris maintain her ibuprofen, cholecalciferol, Cyanocobalamin (VITAMIN B 12 PO), propranolol, tacrolimus, and levonorgestrel-ethinyl estradiol.   No orders of the defined types were placed in this encounter.   Return precautions given.   Risks, benefits, and alternatives of the medications and treatment plan prescribed today were discussed, and patient expressed understanding.   Education regarding symptom management and diagnosis given to patient on AVS.   Continue to follow with [name], FNP for routine health maintenance.   Adrienne Harris and I agreed with plan.   Mable Paris, FNP

## 2021-09-09 NOTE — Assessment & Plan Note (Signed)
Clinical breast exam performed today.  Mammogram is scheduled.  Patient will confirm when colonoscopy is to be repeated by contacting [facility] to ensure that it is 10 years.  Encouraged continued exercise.

## 2021-09-09 NOTE — Patient Instructions (Signed)
Please confirm with [facility] when colonoscopy is due and let me know  Health Maintenance, Female Adopting a healthy lifestyle and getting preventive care are important in promoting health and wellness. Ask your health care provider about: The right schedule for you to have regular tests and exams. Things you can do on your own to prevent diseases and keep yourself healthy. What should I know about diet, weight, and exercise? Eat a healthy diet  Eat a diet that includes plenty of vegetables, fruits, low-fat dairy products, and lean protein. Do not eat a lot of foods that are high in solid fats, added sugars, or sodium. Maintain a healthy weight Body mass index (BMI) is used to identify weight problems. It estimates body fat based on height and weight. Your health care provider can help determine your BMI and help you achieve or maintain a healthy weight. Get regular exercise Get regular exercise. This is one of the most important things you can do for your health. Most adults should: Exercise for at least 150 minutes each week. The exercise should increase your heart rate and make you sweat (moderate-intensity exercise). Do strengthening exercises at least twice a week. This is in addition to the moderate-intensity exercise. Spend less time sitting. Even light physical activity can be beneficial. Watch cholesterol and blood lipids Have your blood tested for lipids and cholesterol at 46 years of age, then have this test every 5 years. Have your cholesterol levels checked more often if: Your lipid or cholesterol levels are high. You are older than 46 years of age. You are at high risk for heart disease. What should I know about cancer screening? Depending on your health history and family history, you may need to have cancer screening at various ages. This may include screening for: Breast cancer. Cervical cancer. Colorectal cancer. Skin cancer. Lung cancer. What should I know about  heart disease, diabetes, and high blood pressure? Blood pressure and heart disease High blood pressure causes heart disease and increases the risk of stroke. This is more likely to develop in people who have high blood pressure readings or are overweight. Have your blood pressure checked: Every 3-5 years if you are 58-23 years of age. Every year if you are 26 years old or older. Diabetes Have regular diabetes screenings. This checks your fasting blood sugar level. Have the screening done: Once every three years after age 81 if you are at a normal weight and have a low risk for diabetes. More often and at a younger age if you are overweight or have a high risk for diabetes. What should I know about preventing infection? Hepatitis B If you have a higher risk for hepatitis B, you should be screened for this virus. Talk with your health care provider to find out if you are at risk for hepatitis B infection. Hepatitis C Testing is recommended for: Everyone born from [birth year] through 1965. Anyone with known risk factors for hepatitis C. Sexually transmitted infections (STIs) Get screened for STIs, including gonorrhea and chlamydia, if: You are sexually active and are younger than 46 years of age. You are older than 46 years of age and your health care provider tells you that you are at risk for this type of infection. Your sexual activity has changed since you were last screened, and you are at increased risk for chlamydia or gonorrhea. Ask your health care provider if you are at risk. Ask your health care provider about whether you are at high risk for  HIV. Your health care provider may recommend a prescription medicine to help prevent HIV infection. If you choose to take medicine to prevent HIV, you should first get tested for HIV. You should then be tested every 3 months for as long as you are taking the medicine. Pregnancy If you are about to stop having your period (premenopausal) and you may  become pregnant, seek counseling before you get pregnant. Take 400 to 800 micrograms (mcg) of folic acid every day if you become pregnant. Ask for birth control (contraception) if you want to prevent pregnancy. Osteoporosis and menopause Osteoporosis is a disease in which the bones lose minerals and strength with aging. This can result in bone fractures. If you are 48 years old or older, or if you are at risk for osteoporosis and fractures, ask your health care provider if you should: Be screened for bone loss. Take a calcium or vitamin D supplement to lower your risk of fractures. Be given hormone replacement therapy (HRT) to treat symptoms of menopause. Follow these instructions at home: Alcohol use Do not drink alcohol if: Your health care provider tells you not to drink. You are pregnant, may be pregnant, or are planning to become pregnant. If you drink alcohol: Limit how much you have to: 0-1 drink a day. Know how much alcohol is in your drink. In the [place] one drink equals one 12 oz bottle of beer (355 mL), one 5 oz glass of wine (148 mL), or one 1 oz glass of hard liquor (44 mL). Lifestyle Do not use any products that contain nicotine or tobacco. These products include cigarettes, chewing tobacco, and vaping devices, such as e-cigarettes. If you need help quitting, ask your health care provider. Do not use street drugs. Do not share needles. Ask your health care provider for help if you need support or information about quitting drugs. General instructions Schedule regular health, dental, and eye exams. Stay current with your vaccines. Tell your health care provider if: You often feel depressed. You have ever been abused or do not feel safe at home. Summary Adopting a healthy lifestyle and getting preventive care are important in promoting health and wellness. Follow your health care provider's instructions about healthy diet, exercising, and getting tested or screened for  diseases. Follow your health care provider's instructions on monitoring your cholesterol and blood pressure. This information is not intended to replace advice given to you by your health care provider. Make sure you discuss any questions you have with your health care provider. Document Revised: 06/18/2020 Document Reviewed: 06/18/2020 Elsevier Patient Education  [place].

## 2021-09-09 NOTE — Assessment & Plan Note (Addendum)
Chronic, stable.  Continue propranolol 20 mg once daily as also helpful for anxiety.  We did discuss this medication typically dosed twice daily or 3 times daily.  She does not feel medication needs to be changed at this time.  She will certainly monitor her symptoms.

## 2021-09-10 ENCOUNTER — Telehealth: Payer: Self-pay

## 2021-09-10 ENCOUNTER — Encounter: Payer: Self-pay | Admitting: Gastroenterology

## 2021-09-10 NOTE — Telephone Encounter (Signed)
Pt wanted to confirm when her next colonoscopy will be due... I did let her know the recall is for 10 year... Pt would like to confirm as her father has Hx of colon polyps... Please advise

## 2021-09-18 ENCOUNTER — Encounter: Payer: Self-pay | Admitting: Family

## 2021-09-18 ENCOUNTER — Ambulatory Visit
Admission: RE | Admit: 2021-09-18 | Discharge: 2021-09-18 | Disposition: A | Discharge status: Home / Self Care | Payer: 59 | Source: Ambulatory Visit | Attending: Family | Admitting: Family

## 2021-09-18 DIAGNOSIS — R928 Other abnormal and inconclusive findings on diagnostic imaging of breast: Secondary | ICD-10-CM | POA: Diagnosis not present

## 2021-09-18 DIAGNOSIS — N6489 Other specified disorders of breast: Secondary | ICD-10-CM | POA: Diagnosis not present

## 2021-09-19 ENCOUNTER — Other Ambulatory Visit: Payer: Self-pay | Admitting: Family

## 2021-09-19 DIAGNOSIS — N63 Unspecified lump in unspecified breast: Secondary | ICD-10-CM

## 2021-09-19 DIAGNOSIS — R928 Other abnormal and inconclusive findings on diagnostic imaging of breast: Secondary | ICD-10-CM

## 2021-09-25 ENCOUNTER — Ambulatory Visit
Admission: RE | Admit: 2021-09-25 | Discharge: 2021-09-25 | Disposition: A | Discharge status: Home / Self Care | Payer: 59 | Source: Ambulatory Visit | Attending: Family | Admitting: Family

## 2021-09-25 DIAGNOSIS — N63 Unspecified lump in unspecified breast: Secondary | ICD-10-CM | POA: Diagnosis not present

## 2021-09-25 DIAGNOSIS — C50811 Malignant neoplasm of overlapping sites of right female breast: Secondary | ICD-10-CM | POA: Diagnosis not present

## 2021-09-25 DIAGNOSIS — R928 Other abnormal and inconclusive findings on diagnostic imaging of breast: Secondary | ICD-10-CM | POA: Diagnosis not present

## 2021-09-25 DIAGNOSIS — C50911 Malignant neoplasm of unspecified site of right female breast: Secondary | ICD-10-CM | POA: Diagnosis not present

## 2021-09-25 HISTORY — PX: BREAST BIOPSY: SHX20

## 2021-09-26 ENCOUNTER — Encounter: Payer: Self-pay | Admitting: *Deleted

## 2021-09-26 DIAGNOSIS — C50919 Malignant neoplasm of unspecified site of unspecified female breast: Secondary | ICD-10-CM

## 2021-09-26 NOTE — Progress Notes (Signed)
Received referral for newly diagnosed breast cancer from [facility].  Navigation initiated.  [name] would like to see Dr. Donne Hazel at [facility], referral sent.   She will see Dr. Janese Banks on Tuesday 8/22 at 2:30.

## 2021-09-27 ENCOUNTER — Telehealth: Payer: Self-pay | Admitting: Family

## 2021-09-27 NOTE — Telephone Encounter (Signed)
Called patient and spoke with her as a relates to results of right breast biopsy and new diagnosis of breast cancer.  She has spoken with the oncology nurse navigator.  Has appointment set up with oncology and surgery next week.  She declines any medication from me at this time for anxiety or depression.  She will let me know how she is doing

## 2021-10-01 ENCOUNTER — Inpatient Hospital Stay: Payer: 59

## 2021-10-01 ENCOUNTER — Inpatient Hospital Stay: Payer: 59 | Attending: Oncology | Admitting: Oncology

## 2021-10-01 ENCOUNTER — Encounter: Payer: Self-pay | Admitting: Oncology

## 2021-10-01 ENCOUNTER — Encounter: Payer: Self-pay | Admitting: *Deleted

## 2021-10-01 VITALS — BP 155/95 | HR 76 | Temp 97.9°F | Resp 18 | Wt 135.0 lb

## 2021-10-01 DIAGNOSIS — M25512 Pain in left shoulder: Secondary | ICD-10-CM | POA: Insufficient documentation

## 2021-10-01 DIAGNOSIS — R1032 Left lower quadrant pain: Secondary | ICD-10-CM | POA: Diagnosis not present

## 2021-10-01 DIAGNOSIS — Z7189 Other specified counseling: Secondary | ICD-10-CM | POA: Diagnosis not present

## 2021-10-01 DIAGNOSIS — C50311 Malignant neoplasm of lower-inner quadrant of right female breast: Secondary | ICD-10-CM | POA: Diagnosis not present

## 2021-10-01 DIAGNOSIS — Z170 Estrogen receptor positive status [ER+]: Secondary | ICD-10-CM | POA: Diagnosis not present

## 2021-10-01 DIAGNOSIS — I493 Ventricular premature depolarization: Secondary | ICD-10-CM | POA: Insufficient documentation

## 2021-10-01 DIAGNOSIS — Z808 Family history of malignant neoplasm of other organs or systems: Secondary | ICD-10-CM | POA: Diagnosis not present

## 2021-10-01 DIAGNOSIS — Z803 Family history of malignant neoplasm of breast: Secondary | ICD-10-CM | POA: Insufficient documentation

## 2021-10-01 NOTE — Progress Notes (Signed)
Hematology/Oncology Consult note [facility] [phone] [phone]  Patient Care Team: [name], FNP as PCP - General (Family Medicine) Daiva Huge, RN as Oncology Nurse Navigator   Name of the patient: Adrienne Harris  [id number]  May 18, 1975    Reason for referral-new diagnosis of breast cancer   Referring 57 Arnett, FNP  Date of visit: 10/01/21   History of presenting illness-patient is a 46 year old female who recently underwent aLateral screening mammogram which showed a possible area of concern in the right breast.  This was followed by diagnostic mammogram and ultrasound which showed a 7 mm mass in the right lower breast.  No suspicious right axillary adenopathy.  Biopsy confirmed invasive mammary carcinoma 6 mm grade 3 ER 51 to 90% positive PR 51 to 90% positive and HER2 negative.  Patient was on birth control which she stopped taking about a week ago.  Menarche at the age of 46.  She is premenopausal.  1 daughter 63 years old.  Family history of breast cancer in her paternal aunt.  Father had melanoma.  Maternal great aunt had breast cancer.  She also has a history of hemochromatosis but has not required any recent phlebotomies.  ECOG PS- 0  Pain scale- 0   Review of systems- Review of Systems  Constitutional:  Negative for chills, fever, malaise/fatigue and weight loss.  HENT:  Negative for congestion, ear discharge and nosebleeds.   Eyes:  Negative for blurred vision.  Respiratory:  Negative for cough, hemoptysis, sputum production, shortness of breath and wheezing.   Cardiovascular:  Negative for chest pain, palpitations, orthopnea and claudication.  Gastrointestinal:  Negative for abdominal pain, blood in stool, constipation, diarrhea, heartburn, melena, nausea and vomiting.  Genitourinary:  Negative for dysuria, flank pain, frequency, hematuria and urgency.  Musculoskeletal:  Negative for back pain, joint  pain and myalgias.  Skin:  Negative for rash.  Neurological:  Negative for dizziness, tingling, focal weakness, seizures, weakness and headaches.  Endo/Heme/Allergies:  Does not bruise/bleed easily.  Psychiatric/Behavioral:  Negative for depression and suicidal ideas. The patient does not have insomnia.     Allergies  Allergen Reactions   Cortizone-10 [Hydrocortisone]     Allergic to all topical corticosteriods   Propylene Glycol     Skin rash    Patient Active Problem List   Diagnosis Date Noted   Malignant neoplasm of lower-inner quadrant of right breast of female, estrogen receptor positive ([place]) 10/01/2021   Colon cancer screening    Intractable menstrual migraine without status migrainosus 07/30/2020   Nonallopathic lesion of sacral region 12/30/2017   Trigger point of left shoulder region 10/06/2017   Slipped rib syndrome 08/05/2017   Nonallopathic lesion of rib cage 08/05/2017   Nonallopathic lesion of cervical region 08/05/2017   Nonallopathic lesion of thoracic region 08/05/2017   Nonallopathic lesion of lumbosacral region 08/05/2017   Chronic left shoulder pain 07/10/2017   Chronic LLQ pain 05/17/2017   Atopic dermatitis 07/10/2016   Hepatic cyst 07/10/2016   Routine physical examination 07/10/2016   PVC's (premature ventricular contractions) 04/22/2013   Hemochromatosis 04/22/2013     Past Medical History:  Diagnosis Date   Atopic dermatitis    Basal cell carcinoma    15 years ago.   Hemochromatosis    Heterozygous for two genes   Palpitations    a. 04/2013 48h Holter: sinus rhythm/sinus arrhythmia, rare PAC's/PVC's.   Shoulder pain, left 01/24/2015   Shoulder pain, right 01/24/2015  Past Surgical History:  Procedure Laterality Date   BREAST BIOPSY Right 09/25/2021   COLONOSCOPY WITH PROPOFOL N/A 01/24/2021   Procedure: COLONOSCOPY WITH PROPOFOL;  Surgeon: [name], MD;  Location: [facility] ENDOSCOPY;  Service: Endoscopy;  Laterality: N/A;    Excision of basal cell carcinoma     WISDOM TOOTH EXTRACTION      Social History   Socioeconomic History   Marital status: Married    Spouse name: Not on file   Number of children: 1   Years of education: Not on file   Highest education level: Not on file  Occupational History   Occupation: Dietitian  Tobacco Use   Smoking status: Never   Smokeless tobacco: Never  Vaping Use   Vaping Use: Never used  Substance and Sexual Activity   Alcohol use: Yes    Alcohol/week: 2.0 standard drinks of alcohol    Types: 2 Cans of beer per week    Comment: 2-4 beer/wine aweek.   Drug use: No   Sexual activity: Yes    Partners: Male    Birth control/protection: Pill  Other Topics Concern   Not on file  Social History Narrative   Married      Daughter -1 yrs.       Dietitian for [employer]   Social Determinants of Health   Financial Resource Strain: Not on file  Food Insecurity: Not on file  Transportation Needs: Not on file  Physical Activity: Inactive (05/15/2017)   Exercise Vital Sign    Days of Exercise per Week: 0 days    Minutes of Exercise per Session: 0 min  Stress: No Stress Concern Present (05/15/2017)   [place]    Feeling of Stress : Only a little  Social Connections: Not on file  Intimate Partner Violence: Not on file     Family History  Problem Relation Age of Onset   Hemochromatosis Mother    Basal cell carcinoma Mother    Hypertension Father    Melanoma Father    Colon polyps Father 7       adenomatous   Breast cancer Paternal Aunt 70   Kidney cancer Paternal Aunt    Alzheimer's disease Paternal Aunt    Hyperlipidemia Maternal Grandmother    Hypertension Maternal Grandmother    Osteoporosis Maternal Grandmother    Hyperlipidemia Maternal Grandfather    Hypertension Maternal Grandfather    Diabetes Maternal Grandfather    Stroke Maternal Grandfather    Alzheimer's disease Paternal  Grandmother    Breast cancer Other        late years   Breast cancer Other        late years   Colon cancer Neg Hx      Current Outpatient Medications:    acetaminophen (TYLENOL) 500 MG tablet, Take 500 mg by mouth every 6 (six) hours as needed., Disp: , Rfl:    cholecalciferol (VITAMIN D) 1000 units tablet, Take 1,000 Units by mouth daily., Disp: , Rfl:    Cyanocobalamin (VITAMIN B 12 PO), Take 1,000 mcg by mouth daily., Disp: , Rfl:    levonorgestrel-ethinyl estradiol (AVIANE) 0.1-20 MG-MCG tablet, Take 1 tablet by mouth daily., Disp: 84 tablet, Rfl: 3   propranolol (INDERAL) 20 MG tablet, Take 1 tablet (20 mg total) by mouth daily., Disp: 90 tablet, Rfl: 3   tacrolimus (PROTOPIC) 0.1 % ointment, Apply to affected areas twice daily as needed, Disp: 60 g, Rfl: 2   ibuprofen (ADVIL,MOTRIN)  200 MG tablet, Take 200 mg by mouth every 6 (six) hours as needed. (Patient not taking: Reported on 10/01/2021), Disp: , Rfl:    Physical exam:  Vitals:   10/01/21 1437  BP: (!) 155/95  Pulse: 76  Resp: 18  Temp: 97.9 F (36.6 C)  SpO2: 100%  Weight: 135 lb (61.2 kg)   Physical Exam Constitutional:      General: She is not in acute distress. Cardiovascular:     Rate and Rhythm: Normal rate and regular rhythm.     Heart sounds: Normal heart sounds.  Pulmonary:     Effort: Pulmonary effort is normal.     Breath sounds: Normal breath sounds.  Skin:    General: Skin is warm and dry.  Neurological:     Mental Status: She is alert and oriented to person, place, and time.   Breast exam: There is bruising and induration noted at the site of recent right breast biopsy.  No distinct palpable mass.  No palpable masses in the left breast.  No palpable bilateral axillary adenopathy.       Latest Ref Rng & Units 09/09/2021    8:36 AM  CMP  Glucose 70 - 99 mg/dL 80   BUN 6 - 23 mg/dL 11   Creatinine 0.40 - 1.20 mg/dL 0.72   Sodium 135 - 145 mEq/L 140   Potassium 3.5 - 5.1 mEq/L 3.9    Chloride 96 - 112 mEq/L 103   CO2 19 - 32 mEq/L 30   Calcium 8.4 - 10.5 mg/dL 8.8   Total Protein 6.0 - 8.3 g/dL 6.5   Total Bilirubin 0.2 - 1.2 mg/dL 0.5   Alkaline Phos 39 - 117 U/L 52   AST 0 - 37 U/L 16   ALT 0 - 35 U/L 13       Latest Ref Rng & Units 09/09/2021    8:36 AM  CBC  WBC 4.0 - 10.5 K/uL 4.6   Hemoglobin 12.0 - 15.0 g/dL 13.9   Hematocrit 36.0 - 46.0 % 41.5   Platelets 150.0 - 400.0 K/uL 248.0     No images are attached to the encounter.  [place] RT BREAST BX W LOC DEV 1ST LESION IMG BX SPEC US GUIDE  Addendum Date: 09/26/2021   ADDENDUM REPORT: 09/26/2021 12:37 ADDENDUM: PATHOLOGY revealed: A. BREAST, RIGHT, MASS; CORE BIOPSIES: - INVASIVE MAMMARY CARCINOMA, NO SPECIAL TYPE. Size of invasive carcinoma: 6 mm in this sample. Grade 3 of 3. Ductal carcinoma in situ: Present. Lymphovascular invasion: Not identified. Pathology results are CONCORDANT with imaging findings, per Dr. Nolon Nations. Pathology results and recommendations were discussed with patient via telephone on 09/26/2021. Patient reported biopsy site doing well with no adverse symptoms, and only slight tenderness at the site. Post biopsy care instructions were reviewed, questions were answered and my direct phone number was provided. Patient was instructed to call [facility] for any additional questions or concerns related to biopsy site. RECOMMENDATIONS: Surgical and oncological consultation. Request for surgical and oncological consultation relayed to Casper Harrison RN at [facility] by Electa Sniff RN on 09/26/2021. Pathology results reported by Electa Sniff RN on 09/26/2021. Electronically Signed   By: Nolon Nations M.D.   On: 09/26/2021 12:37   Result Date: 09/26/2021 CLINICAL DATA:  Patient presents for ultrasound-guided core biopsy of RIGHT mass. EXAM: ULTRASOUND GUIDED RIGHT BREAST CORE NEEDLE BIOPSY COMPARISON:  Previous exam(s). PROCEDURE: I met with the patient and we discussed the  procedure of  ultrasound-guided biopsy, including benefits and alternatives. We discussed the high likelihood of a successful procedure. We discussed the risks of the procedure, including infection, bleeding, tissue injury, clip migration, and inadequate sampling. Informed written consent was given. The usual time-out protocol was performed immediately prior to the procedure. Lesion quadrant: 6 o'clock RIGHT breast Using sterile technique and 1% Lidocaine as local anesthetic, under direct ultrasound visualization, a 12 gauge spring-loaded device was used to perform biopsy of mass in the 6 o'clock location of the RIGHT breast using a MEDIAL approach. At the conclusion of the procedure ribbon shaped tissue marker clip was deployed into the biopsy cavity. Follow up 2 view mammogram was performed and dictated separately. IMPRESSION: Ultrasound guided biopsy of RIGHT breast mass. No apparent complications. Electronically Signed: By: Nolon Nations M.D. On: 09/25/2021 09:27   MM CLIP PLACEMENT RIGHT  Result Date: 09/25/2021 CLINICAL DATA:  Status post ultrasound-guided core biopsy of RIGHT breast mass. EXAM: 3D DIAGNOSTIC RIGHT MAMMOGRAM POST ULTRASOUND BIOPSY COMPARISON:  Previous exam(s). FINDINGS: 3D Mammographic images were obtained following ultrasound guided biopsy of mass in the 6 o'clock location of the RIGHT breast and placement of a ribbon shaped clip. The biopsy marking clip is in expected position at the site of biopsy. IMPRESSION: Appropriate positioning of the ribbon shaped biopsy marking clip at the site of biopsy in the LOWER central RIGHT breast. Final Assessment: Post Procedure Mammograms for Marker Placement Electronically Signed   By: Nolon Nations M.D.   On: 09/25/2021 09:40  MM DIAG BREAST TOMO UNI RIGHT  Result Date: 09/18/2021 CLINICAL DATA:  Callback for RIGHT breast asymmetry. EXAM: DIGITAL DIAGNOSTIC UNILATERAL RIGHT MAMMOGRAM WITH TOMOSYNTHESIS; ULTRASOUND RIGHT BREAST LIMITED  TECHNIQUE: Right digital diagnostic mammography and breast tomosynthesis was performed.; Targeted ultrasound examination of the right breast was performed COMPARISON:  Previous exam(s). ACR Breast Density Category c: The breast tissue is heterogeneously dense, which may obscure small masses. FINDINGS: Spot compression tomosynthesis views confirm persistence of an irregular mass in the RIGHT lower breast with associated architectural distortion. It is best seen on spot CC slice 56 with correlate identified on repeat MLO slice 35 and spot MLO 38. On physical exam, there is a firm mass in the RIGHT lower breast. Targeted ultrasound was performed of the RIGHT lower breast. At 6 o'clock 3 cm from the nipple, there is an irregular hypoechoic mass with irregular margins. It measures 7 x 6 by 7 mm. This corresponds to the site of screening mammographic concern. Targeted ultrasound was performed of the RIGHT axilla. No suspicious axillary lymph nodes are visualized. IMPRESSION: 1. A 7 mm mass in the RIGHT lower breast is concerning for malignancy. Recommend ultrasound-guided biopsy for definitive characterization. 2. No suspicious RIGHT axillary adenopathy. RECOMMENDATION: RIGHT breast ultrasound-guided biopsy x1 I have discussed the findings and recommendations with the patient. The biopsy procedure was discussed with the patient and questions were answered. Patient expressed their understanding of the biopsy recommendation. Patient will be scheduled for biopsy at her earliest convenience by the schedulers. Ordering provider will be notified. If applicable, a reminder letter will be sent to the patient regarding the next appointment. BI-RADS CATEGORY  5: Highly suggestive of malignancy. Electronically Signed   By: Valentino Saxon M.D.   On: 09/18/2021 16:46  US BREAST LTD UNI RIGHT INC AXILLA  Result Date: 09/18/2021 CLINICAL DATA:  Callback for RIGHT breast asymmetry. EXAM: DIGITAL DIAGNOSTIC UNILATERAL RIGHT  MAMMOGRAM WITH TOMOSYNTHESIS; ULTRASOUND RIGHT BREAST LIMITED TECHNIQUE: Right digital diagnostic mammography and breast tomosynthesis  was performed.; Targeted ultrasound examination of the right breast was performed COMPARISON:  Previous exam(s). ACR Breast Density Category c: The breast tissue is heterogeneously dense, which may obscure small masses. FINDINGS: Spot compression tomosynthesis views confirm persistence of an irregular mass in the RIGHT lower breast with associated architectural distortion. It is best seen on spot CC slice 56 with correlate identified on repeat MLO slice 35 and spot MLO 38. On physical exam, there is a firm mass in the RIGHT lower breast. Targeted ultrasound was performed of the RIGHT lower breast. At 6 o'clock 3 cm from the nipple, there is an irregular hypoechoic mass with irregular margins. It measures 7 x 6 by 7 mm. This corresponds to the site of screening mammographic concern. Targeted ultrasound was performed of the RIGHT axilla. No suspicious axillary lymph nodes are visualized. IMPRESSION: 1. A 7 mm mass in the RIGHT lower breast is concerning for malignancy. Recommend ultrasound-guided biopsy for definitive characterization. 2. No suspicious RIGHT axillary adenopathy. RECOMMENDATION: RIGHT breast ultrasound-guided biopsy x1 I have discussed the findings and recommendations with the patient. The biopsy procedure was discussed with the patient and questions were answered. Patient expressed their understanding of the biopsy recommendation. Patient will be scheduled for biopsy at her earliest convenience by the schedulers. Ordering provider will be notified. If applicable, a reminder letter will be sent to the patient regarding the next appointment. BI-RADS CATEGORY  5: Highly suggestive of malignancy. Electronically Signed   By: Valentino Saxon M.D.   On: 09/18/2021 16:46   Assessment and plan- Patient is a 46 y.o. female with newly diagnosed clinical prognostic stage Ia  invasive mammary carcinoma of the right breast cT1b cN0 cM0 ER 51 to 90% positive, PR 51 to 90% positive and HER2 negative.  She is here to discuss further management  I have discussed the results of mammogram and pathology with the patient in great detail.  Overall she has a 7 mm grade 3 ER/PR positive HER2 negative tumor noted on mammogram and biopsy.  At this time I would recommend upfront lumpectomy with a sentinel lymph node biopsy.  Given that she has a grade 3 tumor more than 5 mm she would require Oncotype testing on either her biopsy specimen on her final pathology specimen based on the final size.  Discussed what Oncotype testing is and how the results are interpreted.  At her age if her score is 63 or less she will be in the low risk group and would not benefit from adjuvant chemotherapy.  Chemotherapy would be indicated if her score is 26 or higher.  If her score is between 11-26 she would fall in the intermediate risk group and chemotherapy is a potential consideration for scores between 16-25.  I will discuss with her in greater detail after her Oncotype testing back  There would also be role for adjuvant radiation therapy after lumpectomy depending on whether she needs chemotherapy or not.  Also given the fact that she has ER/PR positive tumor there would be role for hormone therapy.  Given that she is premenopausal she would likely need to take that for 10 years.  Treatment will be given with a curative intent.  In terms of future birth control I would recommend copper IUD which is a nonhormonal measure over any hormonal methods of contraception.  Patient will be seeing Dr. Donne Hazel to discuss lumpectomy in more detail and I will see her back after final pathology and Oncotype testing results are back  We are  also sending blood work out for genetic testing today and subsequently patient will be seen by genetic counseling   Cancer Staging  Malignant neoplasm of lower-inner quadrant of  right breast of female, estrogen receptor positive ([place]) Staging form: Breast, AJCC 8th Edition - Clinical stage from 10/01/2021: Stage IA (cT1b, cN0, cM0, G3, ER+, PR+, HER2-) - Signed by [name], MD on 10/01/2021 Histologic grading system: 3 grade system     Thank you for this kind referral and the opportunity to participate in the care of this patient   Visit Diagnosis 1. Malignant neoplasm of lower-inner quadrant of right breast of female, estrogen receptor positive ([place])   2. Goals of care, counseling/discussion     Dr. Randa Evens, MD, MPH [facility] at [facility] [phone] 10/01/2021

## 2021-10-01 NOTE — Progress Notes (Signed)
Accompanied patient and family to initial medical oncology appointment.   Reviewed Breast Cancer treatment handbook.   Care plan summary given to patient.   

## 2021-10-01 NOTE — Progress Notes (Signed)
Pt will like to discuss birth control but does not plan on re-starting.

## 2021-10-02 ENCOUNTER — Encounter: Payer: Self-pay | Admitting: *Deleted

## 2021-10-02 DIAGNOSIS — C50311 Malignant neoplasm of lower-inner quadrant of right female breast: Secondary | ICD-10-CM

## 2021-10-03 ENCOUNTER — Inpatient Hospital Stay: Payer: 59

## 2021-10-03 ENCOUNTER — Inpatient Hospital Stay (HOSPITAL_BASED_OUTPATIENT_CLINIC_OR_DEPARTMENT_OTHER): Payer: 59 | Admitting: Licensed Clinical Social Worker

## 2021-10-03 ENCOUNTER — Encounter: Payer: Self-pay | Admitting: Licensed Clinical Social Worker

## 2021-10-03 DIAGNOSIS — Z803 Family history of malignant neoplasm of breast: Secondary | ICD-10-CM | POA: Diagnosis not present

## 2021-10-03 DIAGNOSIS — C50311 Malignant neoplasm of lower-inner quadrant of right female breast: Secondary | ICD-10-CM

## 2021-10-03 DIAGNOSIS — Z808 Family history of malignant neoplasm of other organs or systems: Secondary | ICD-10-CM

## 2021-10-03 DIAGNOSIS — Z170 Estrogen receptor positive status [ER+]: Secondary | ICD-10-CM

## 2021-10-03 DIAGNOSIS — Z8051 Family history of malignant neoplasm of kidney: Secondary | ICD-10-CM | POA: Diagnosis not present

## 2021-10-03 NOTE — Progress Notes (Signed)
REFERRING PROVIDER: [name], MD [place],  [place] [address]  PRIMARY PROVIDER:  [name], FNP  PRIMARY REASON FOR VISIT:  1. Malignant neoplasm of lower-inner quadrant of right breast of female, estrogen receptor positive ([place])   2. Family history of breast cancer   3. Family history of kidney cancer   4. Family history of melanoma      HISTORY OF PRESENT ILLNESS:   Adrienne Harris, a 46 y.o. female, was seen for a  cancer genetics consultation at the request of Dr. Janese Banks due to a personal and family history of breast cancer.  Adrienne Harris presents to clinic today to discuss the possibility of a hereditary predisposition to cancer, genetic testing, and to further clarify her future cancer risks, as well as potential cancer risks for family members.   In 2023, at the age of 41, Adrienne Harris was diagnosed with invasive mammary carcinoma of the right breast, ER/PR+, Her2-. The treatment plan is still being determined, but initial plan is for lumpectomy. Adrienne Harris will meet with Dr. Donne Hazel 8/25.   CANCER HISTORY:  Oncology History  Malignant neoplasm of lower-inner quadrant of right breast of female, estrogen receptor positive ([place])  10/01/2021 Initial Diagnosis   Malignant neoplasm of lower-inner quadrant of right breast of female, estrogen receptor positive ([place])   10/01/2021 Cancer Staging   Staging form: Breast, AJCC 8th Edition - Clinical stage from 10/01/2021: Stage IA (cT1b, cN0, cM0, G3, ER+, PR+, HER2-) - Signed by [name], MD on 10/01/2021 Histologic grading system: 3 grade system     RISK FACTORS:  Menarche was at age 18.  First live birth at age 43.  OCP use: yes Ovaries intact: yes.  Hysterectomy: no.  Menopausal status: premenopausal.  Colonoscopy: yes; normal. Mammogram within the last year: yes. Number of breast biopsies: 1.  Past Medical History:  Diagnosis Date   Atopic dermatitis    Basal cell carcinoma    15 years  ago.   Hemochromatosis    Heterozygous for two genes   Palpitations    a. 04/2013 48h Holter: sinus rhythm/sinus arrhythmia, rare PAC's/PVC's.   Shoulder pain, left 01/24/2015   Shoulder pain, right 01/24/2015    Past Surgical History:  Procedure Laterality Date   BREAST BIOPSY Right 09/25/2021   COLONOSCOPY WITH PROPOFOL N/A 01/24/2021   Procedure: COLONOSCOPY WITH PROPOFOL;  Surgeon: [name], MD;  Location: [facility] ENDOSCOPY;  Service: Endoscopy;  Laterality: N/A;   Excision of basal cell carcinoma     WISDOM TOOTH EXTRACTION      FAMILY HISTORY:  We obtained a detailed, 4-generation family history.  Significant diagnoses are listed below: Family History  Problem Relation Age of Onset   Hemochromatosis Mother    Basal cell carcinoma Mother    Hypertension Father    Melanoma Father        multiple removed   Colon polyps Father 4       adenomatous   Breast cancer Paternal Aunt 15   Kidney cancer Paternal Aunt    Alzheimer's disease Paternal Aunt    Hyperlipidemia Maternal Grandmother    Hypertension Maternal Grandmother    Osteoporosis Maternal Grandmother    Hyperlipidemia Maternal Grandfather    Hypertension Maternal Grandfather    Diabetes Maternal Grandfather    Stroke Maternal Grandfather    Alzheimer's disease Paternal Grandmother    Breast cancer Other        late years   Breast cancer Other  late years   Colon cancer Neg Hx    Adrienne Harris has 1 daughter, 17. She has 1 brother, 22, with no cancer history.  Adrienne Harris mother is living at 1 and has had basal cell carcinoma. Patient has 1 maternal uncle, no cancers. Maternal grandmother passed at 56. Grandfather passed in his 81s-80s, two of his sisters had breast cancer in their 28s-70s.  Adrienne Harris father has had approximately 10 colon polyps and multiple melanomas removed, he is living at 45. Patient has 1 paternal aunt, 1 uncle. Her aunt had breast cancer in her 42s-60s and then kidney cancer, she  passed at 1. No cancers for paternal grandparents.  Adrienne Harris is unaware of previous family history of genetic testing for hereditary cancer risks. There is no reported Ashkenazi Jewish ancestry. There is no known consanguinity.    GENETIC COUNSELING ASSESSMENT: Adrienne Harris is a 46 y.o. female with a personal and family history of breast cancer which is somewhat suggestive of a hereditary cancer syndrome and predisposition to cancer. We, therefore, discussed and recommended the following at today's visit.   DISCUSSION: We discussed that approximately 10% of breast cancer is hereditary. Most cases of hereditary breast cancer are associated with BRCA1/BRCA2 genes, although there are other genes associated with hereditary cancer as well. Cancers and risks are gene specific. We discussed that testing is beneficial for several reasons including knowing about cancer risks, identifying potential screening and risk-reduction options that may be appropriate, and to understand if other family members could be at risk for cancer and allow them to undergo genetic testing.   We reviewed the characteristics, features and inheritance patterns of hereditary cancer syndromes. We also discussed genetic testing, including the appropriate family members to test, the process of testing, insurance coverage and turn-around-time for results. We discussed the implications of a negative, positive and/or variant of uncertain significant result. We recommended Adrienne Harris pursue genetic testing for the [facility] Breast Cancer STAT+ Multi-Cancer+RNA gene panel.   Based on Ms. Winstanley's personal and family history of cancer, she meets medical criteria for genetic testing. Despite that she meets criteria, she may still have an out of pocket cost. We discussed that if her out of pocket cost for testing is over $100, the laboratory will call and confirm whether she wants to proceed with testing.  If the out of pocket cost of testing is less  than $100 she will be billed by the genetic testing laboratory.   PLAN: After considering the risks, benefits, and limitations, Adrienne Harris provided informed consent to pursue genetic testing and the blood sample was sent to [facility] for analysis of the Breast Cancer STAT + Multi-Cancer+RNA panel. Results should be available within approximately 5-12 days time, at which point they will be disclosed by telephone to Adrienne Harris, as will any additional recommendations warranted by these results. Ms. Colla will receive a summary of her genetic counseling visit and a copy of her results once available. This information will also be available in Epic.   Ms. Zeleznik questions were answered to her satisfaction today. Our contact information was provided should additional questions or concerns arise. Thank you for the referral and allowing [place] to share in the care of your patient.   Faith Rogue, MS, [facility] Genetic Counselor [email or web address] Phone: [phone]  The patient was seen for a total of 25 minutes in face-to-face genetic counseling.  Dr. Grayland Ormond was available for discussion regarding this case.   _______________________________________________________________________ For Office Staff:  Number of  people involved in session: 1 Was an Intern/ student involved with case: no

## 2021-10-04 DIAGNOSIS — C50311 Malignant neoplasm of lower-inner quadrant of right female breast: Secondary | ICD-10-CM | POA: Diagnosis not present

## 2021-10-04 DIAGNOSIS — Z170 Estrogen receptor positive status [ER+]: Secondary | ICD-10-CM | POA: Diagnosis not present

## 2021-10-07 ENCOUNTER — Encounter: Payer: Self-pay | Admitting: Physical Therapy

## 2021-10-07 ENCOUNTER — Other Ambulatory Visit: Payer: Self-pay

## 2021-10-07 ENCOUNTER — Ambulatory Visit: Payer: 59 | Attending: General Surgery | Admitting: Physical Therapy

## 2021-10-07 ENCOUNTER — Other Ambulatory Visit: Payer: Self-pay | Admitting: *Deleted

## 2021-10-07 DIAGNOSIS — Z170 Estrogen receptor positive status [ER+]: Secondary | ICD-10-CM | POA: Diagnosis not present

## 2021-10-07 DIAGNOSIS — R293 Abnormal posture: Secondary | ICD-10-CM | POA: Diagnosis not present

## 2021-10-07 DIAGNOSIS — C50311 Malignant neoplasm of lower-inner quadrant of right female breast: Secondary | ICD-10-CM | POA: Diagnosis not present

## 2021-10-07 NOTE — Therapy (Signed)
OUTPATIENT PHYSICAL THERAPY BREAST CANCER BASELINE EVALUATION   Patient Name: Adrienne Harris MRN: [id number] DOB:[birth year], 46 y.o., female Today's Date: 10/07/2021   PT End of Session - 10/07/21 1410     Visit Number 1    Number of Visits 2    Date for PT Re-Evaluation 12/02/21    PT Start Time 1438    PT Stop Time 1355    PT Time Calculation (min) 60 min    Activity Tolerance Patient tolerated treatment well    Behavior During Therapy WFL for tasks assessed/performed             Past Medical History:  Diagnosis Date   Atopic dermatitis    Basal cell carcinoma    15 years ago.   Hemochromatosis    Heterozygous for two genes   Palpitations    a. 04/2013 48h Holter: sinus rhythm/sinus arrhythmia, rare PAC's/PVC's.   Shoulder pain, left 01/24/2015   Shoulder pain, right 01/24/2015   Past Surgical History:  Procedure Laterality Date   BREAST BIOPSY Right 09/25/2021   COLONOSCOPY WITH PROPOFOL N/A 01/24/2021   Procedure: COLONOSCOPY WITH PROPOFOL;  Surgeon: [name], MD;  Location: [facility] ENDOSCOPY;  Service: Endoscopy;  Laterality: N/A;   Excision of basal cell carcinoma     WISDOM TOOTH EXTRACTION     Patient Active Problem List   Diagnosis Date Noted   Malignant neoplasm of lower-inner quadrant of right breast of female, estrogen receptor positive ([place]) 10/01/2021   Colon cancer screening    Intractable menstrual migraine without status migrainosus 07/30/2020   Nonallopathic lesion of sacral region 12/30/2017   Trigger point of left shoulder region 10/06/2017   Slipped rib syndrome 08/05/2017   Nonallopathic lesion of rib cage 08/05/2017   Nonallopathic lesion of cervical region 08/05/2017   Nonallopathic lesion of thoracic region 08/05/2017   Nonallopathic lesion of lumbosacral region 08/05/2017   Chronic left shoulder pain 07/10/2017   Chronic LLQ pain 05/17/2017   Atopic dermatitis 07/10/2016   Hepatic cyst 07/10/2016   Routine physical examination  07/10/2016   PVC's (premature ventricular contractions) 04/22/2013   Hemochromatosis 04/22/2013    REFERRING PROVIDER: Dr. Rolm Bookbinder  REFERRING DIAG: Right breast cancer  THERAPY DIAG:  Malignant neoplasm of lower-inner quadrant of right breast of female, estrogen receptor positive ([place]) - Plan: PT plan of care cert/re-cert  Abnormal posture - Plan: PT plan of care cert/re-cert  Rationale for Evaluation and Treatment Rehabilitation  ONSET DATE: 09/25/2021  SUBJECTIVE  SUBJECTIVE STATEMENT: Patient reports she is here today to be seen by her medical team for her newly diagnosed right breast cancer.   PERTINENT HISTORY:  Patient was diagnosed on 09/25/2021 with right grade 3 invasive mammary carcinoma of no particular type. It measures 7 mm and is located in the lower inner quadrant. It is ER/PR positive and HER2 negative and the Ki67 was not measured.   PATIENT GOALS   reduce lymphedema risk and learn post op HEP.   PAIN:  Are you having pain? Yes: NPRS scale: varies/10 Pain location: Left upper trap and shoulder Pain description: achy/tight Aggravating factors: stress Relieving factors: massage   PRECAUTIONS: Active CA   HAND DOMINANCE: right  WEIGHT BEARING RESTRICTIONS No  FALLS:  Has patient fallen in last 6 months? No  LIVING ENVIRONMENT: Patient lives with: her husband and 43 y.o. daughter who is a Equities trader in high school Lives in: House/apartment Has following equipment at home: None  OCCUPATION: She is a full time dietician at [employer]: She walks on a treadmill 5 days per week for 30 minutes and lifts upper body weights twice a week  PRIOR LEVEL OF FUNCTION: Independent   OBJECTIVE  COGNITION:  Overall cognitive status: Within functional limits for  tasks assessed    POSTURE:  Forward head and rounded shoulders posture  UPPER EXTREMITY AROM/PROM:  A/PROM RIGHT   eval   Shoulder extension 50  Shoulder flexion 137  Shoulder abduction 166  Shoulder internal rotation 66  Shoulder external rotation 82    (Blank rows = not tested)  A/PROM LEFT   eval  Shoulder extension 41  Shoulder flexion 144  Shoulder abduction 153  Shoulder internal rotation 66  Shoulder external rotation 90    (Blank rows = not tested)   CERVICAL AROM: All within normal limits:    Percent limited  Flexion WNL  Extension WNL  Right lateral flexion 25% limited  Left lateral flexion 25% limited  Right rotation 25% limited  Left rotation 25% limited     UPPER EXTREMITY STRENGTH: WNL   LYMPHEDEMA ASSESSMENTS:   LANDMARK RIGHT   eval  10 cm proximal to olecranon process 25.3  Olecranon process 23.6  10 cm proximal to ulnar styloid process 19.8  Just proximal to ulnar styloid process 14.8  Across hand at thumb web space 17.8  At base of 2nd digit 5.7  (Blank rows = not tested)  LANDMARK LEFT   eval  10 cm proximal to olecranon process 26.5  Olecranon process 23.5  10 cm proximal to ulnar styloid process 19.4  Just proximal to ulnar styloid process 15.3  Across hand at thumb web space 17.9  At base of 2nd digit 5.5  (Blank rows = not tested)   L-DEX LYMPHEDEMA SCREENING:  The patient was assessed using the L-Dex machine today to produce a lymphedema index baseline score. The patient will be reassessed on a regular basis (typically every 3 months) to obtain new L-Dex scores. If the score is > 6.5 points away from his/her baseline score indicating onset of subclinical lymphedema, it will be recommended to wear a compression garment for 4 weeks, 12 hours per day and then be reassessed. If the score continues to be > 6.5 points from baseline at reassessment, we will initiate lymphedema treatment. Assessing in this manner has a 95% rate of  preventing clinically significant lymphedema.   L-DEX FLOWSHEETS - 10/07/21 1400       L-DEX LYMPHEDEMA SCREENING  Measurement Type Unilateral    L-DEX MEASUREMENT EXTREMITY Upper Extremity    POSITION  Standing    DOMINANT SIDE Right    At Risk Side Right    BASELINE SCORE (UNILATERAL) -6.4              QUICK DASH SURVEY:  Katina Dung - 10/07/21 0001     Open a tight or new jar No difficulty    Do heavy household chores (wash walls, wash floors) No difficulty    Carry a shopping bag or briefcase No difficulty    Wash your back No difficulty    Use a knife to cut food No difficulty    Recreational activities in which you take some force or impact through your arm, shoulder, or hand (golf, hammering, tennis) No difficulty    During the past week, to what extent has your arm, shoulder or hand problem interfered with your normal social activities with family, friends, neighbors, or groups? Not at all    During the past week, to what extent has your arm, shoulder or hand problem limited your work or other regular daily activities Not at all    Arm, shoulder, or hand pain. None    Tingling (pins and needles) in your arm, shoulder, or hand None    Difficulty Sleeping No difficulty    DASH Score 0 %              PATIENT EDUCATION:  Education details: Lymphedema risk reduction and post op shoulder/posture HEP Person educated: Patient Education method: Explanation, Demonstration, Handout Education comprehension: Patient verbalized understanding and returned demonstration   HOME EXERCISE PROGRAM: Patient was instructed today in a home exercise program today for post op shoulder range of motion. These included active assist shoulder flexion in sitting, scapular retraction, wall walking with shoulder abduction, and hands behind head external rotation.  She was encouraged to do these twice a day, holding 3 seconds and repeating 5 times when permitted by her  physician.   ASSESSMENT:  CLINICAL IMPRESSION: Patient was diagnosed on 09/25/2021 with right grade 3 invasive mammary carcinoma of no particular type. It measures 7 mm and is located in the lower inner quadrant. It is ER/PR positive and HER2 negative and the Ki67 was not measured.  Her multidisciplinary medical team met prior to her assessments to determine a recommended treatment plan. She is planning to have genetic testing. As long as that is negative, she will undergo a right lumpectomy and sentinel node biopsy followed by Oncotype testing, radiation, and anti-estrogen therapy. She will benefit from a post op PT reassessment to determine needs and from L-Dex screens every 3 months for 2 years to detect subclinical lymphedema.  Pt will benefit from skilled therapeutic intervention to improve on the following deficits: Decreased knowledge of precautions, impaired UE functional use, pain, decreased ROM, postural dysfunction.   PT treatment/interventions: ADL/self-care home management, pt/family education, therapeutic exercise  REHAB POTENTIAL: Excellent  CLINICAL DECISION MAKING: Stable/uncomplicated  EVALUATION COMPLEXITY: Low   GOALS: Goals reviewed with patient? YES  LONG TERM GOALS: (STG=LTG)    Name Target Date Goal status  1 Pt will be able to verbalize understanding of pertinent lymphedema risk reduction practices relevant to her dx specifically related to skin care.  Baseline:  No knowledge 10/07/2021 Achieved at eval  2 Pt will be able to return demo and/or verbalize understanding of the post op HEP related to regaining shoulder ROM. Baseline:  No knowledge 10/07/2021 Achieved at eval  3  Pt will be able to verbalize understanding of the importance of attending the post op After Breast CA Class for further lymphedema risk reduction education and therapeutic exercise.  Baseline:  No knowledge 10/07/2021 Achieved at eval  4 Pt will demo she has regained full shoulder ROM and  function post operatively compared to baselines.  Baseline: See objective measurements taken today. 12/02/2021      PLAN: PT FREQUENCY/DURATION: EVAL and 1 follow up appointment.   PLAN FOR NEXT SESSION: will reassess 3-4 weeks post op to determine needs.   Patient will follow up at outpatient cancer rehab 3-4 weeks following surgery.  If the patient requires physical therapy at that time, a specific plan will be dictated and sent to the referring physician for approval. The patient was educated today on appropriate basic range of motion exercises to begin post operatively and the importance of attending the After Breast Cancer class following surgery.  Patient was educated today on lymphedema risk reduction practices as it pertains to recommendations that will benefit the patient immediately following surgery.  She verbalized good understanding.    Physical Therapy Information for After Breast Cancer Surgery/Treatment:  Lymphedema is a swelling condition that you may be at risk for in your arm if you have lymph nodes removed from the armpit area.  After a sentinel node biopsy, the risk is approximately 5-9% and is higher after an axillary node dissection.  There is treatment available for this condition and it is not life-threatening.  Contact your physician or physical therapist with concerns. You may begin the 4 shoulder/posture exercises (see additional sheet) when permitted by your physician (typically a week after surgery).  If you have drains, you may need to wait until those are removed before beginning range of motion exercises.  A general recommendation is to not lift your arms above shoulder height until drains are removed.  These exercises should be done to your tolerance and gently.  This is not a "no pain/no gain" type of recovery so listen to your body and stretch into the range of motion that you can tolerate, stopping if you have pain.  If you are having immediate reconstruction, ask  your plastic surgeon about doing exercises as he or she may want you to wait. We encourage you to attend the free one time ABC (After Breast Cancer) class offered by [facility].  You will learn information related to lymphedema risk, prevention and treatment and additional exercises to regain mobility following surgery.  You can call [phone] for more information.  This is offered the 1st and 3rd Monday of each month.  You only attend the class one time. While undergoing any medical procedure or treatment, try to avoid blood pressure being taken or needle sticks from occurring on the arm on the side of cancer.   This recommendation begins after surgery and continues for the rest of your life.  This may help reduce your risk of getting lymphedema (swelling in your arm). An excellent resource for those seeking information on lymphedema is the National Lymphedema Network's web site. It can be accessed at [place].org If you notice swelling in your hand, arm or breast at any time following surgery (even if it is many years from now), please contact your doctor or physical therapist to discuss this.  Lymphedema can be treated at any time but it is easier for you if it is treated early on.  If you feel like your shoulder motion is not returning  to normal in a reasonable amount of time, please contact your surgeon or physical therapist.  [place] [phone]. [address], [address], [place] [address]  ABC CLASS After Breast Cancer Class  After Breast Cancer Class is a specially designed exercise class to assist you in a safe recover after having breast cancer surgery.  In this class you will learn how to get back to full function whether your drains were just removed or if you had surgery a month ago.  This one-time class is held the 1st and 3rd Monday of every month from 11:00 a.m. until 12:00 noon virtually.  This class is FREE and  space is limited. For more information or to register for the next available class, call [phone].  Class Goals  Understand specific stretches to improve the flexibility of you chest and shoulder. Learn ways to safely strengthen your upper body and improve your posture. Understand the warning signs of infection and why you may be at risk for an arm infection. Learn about Lymphedema and prevention.  ** You do not attend this class until after surgery.  Drains must be removed to participate  Patient was instructed today in a home exercise program today for post op shoulder range of motion. These included active assist shoulder flexion in sitting, scapular retraction, wall walking with shoulder abduction, and hands behind head external rotation.  She was encouraged to do these twice a day, holding 3 seconds and repeating 5 times when permitted by her physician.  Annia Friendly, [place] 10/07/21 2:19 PM

## 2021-10-08 ENCOUNTER — Other Ambulatory Visit: Payer: Self-pay | Admitting: General Surgery

## 2021-10-08 DIAGNOSIS — Z170 Estrogen receptor positive status [ER+]: Secondary | ICD-10-CM

## 2021-10-08 NOTE — Pre-Procedure Instructions (Signed)
Surgical Instructions    Your procedure is scheduled on Thursday 10/17/21.   Report to [facility] Main Entrance "A" at 09:30 A.M., then check in with the Admitting office.  Call this number if you have problems the morning of surgery:  [phone]   If you have any questions prior to your surgery date call [phone]: Open Monday-Friday 8am-4pm    Remember:  Do not eat after midnight the night before your surgery  You may drink clear liquids until 08:30 A.M. the morning of your surgery.   Clear liquids allowed are: Water, Non-Citrus Juices (without pulp), Carbonated Beverages, Clear Tea, Black Coffee ONLY (NO MILK, CREAM OR POWDERED CREAMER of any kind), and Gatorade   Patient Instructions  The night before surgery:  No food after midnight. ONLY clear liquids after midnight  The day of surgery (if you do NOT have diabetes):  Drink ONE (1) Pre-Surgery Clear Ensure by 08:30 A.M. the morning of surgery. Drink in one sitting. Do not sip.  This drink was given to you during your hospital  pre-op appointment visit.  Nothing else to drink after completing the  Pre-Surgery Clear Ensure.         If you have questions, please contact your surgeon's office.   Take these medicines the morning of surgery with A SIP OF WATER:   propranolol (INDERAL)   Take these medicines if needed:  acetaminophen (TYLENOL)   tacrolimus (PROTOPIC)   As of today, STOP taking any Aspirin (unless otherwise instructed by your surgeon) Aleve, Naproxen, Ibuprofen, Motrin, Advil, Goody's, BC's, all herbal medications, fish oil, and all vitamins.           Do not wear jewelry or makeup. Do not wear lotions, powders, perfumes/cologne or deodorant. Do not shave 48 hours prior to surgery.  Men may shave face and neck. Do not bring valuables to the hospital. Do not wear nail polish, gel polish, artificial nails, or any other type of covering on natural nails (fingers and toes) If you have artificial nails or  gel coating that need to be removed by a nail salon, please have this removed prior to surgery. Artificial nails or gel coating may interfere with anesthesia's ability to adequately monitor your vital signs.  [place] is not responsible for any belongings or valuables.    Do NOT Smoke (Tobacco/Vaping)  24 hours prior to your procedure  If you use a CPAP at night, you may bring your mask for your overnight stay.   Contacts, glasses, hearing aids, dentures or partials may not be worn into surgery, please bring cases for these belongings   For patients admitted to the hospital, discharge time will be determined by your treatment team.   Patients discharged the day of surgery will not be allowed to drive home, and someone needs to stay with them for 24 hours.   SURGICAL WAITING ROOM VISITATION Patients having surgery or a procedure may have no more than 2 support people in the waiting area - these visitors may rotate.   Children under the age of 6 must have an adult with them who is not the patient. If the patient needs to stay at the hospital during part of their recovery, the visitor guidelines for inpatient rooms apply. Pre-op nurse will coordinate an appropriate time for 1 support person to accompany patient in pre-op.  This support person may not rotate.   Please refer to the [facility] website for the visitor guidelines for Inpatients (after your surgery is over  and you are in a regular room).    Special instructions:    Oral Hygiene is also important to reduce your risk of infection.  Remember - BRUSH YOUR TEETH THE MORNING OF SURGERY WITH YOUR REGULAR TOOTHPASTE   [facility]- Preparing For Surgery  Before surgery, you can play an important role. Because skin is not sterile, your skin needs to be as free of germs as possible. You can reduce the number of germs on your skin by washing with CHG (chlorahexidine gluconate) Soap before surgery.  CHG is an antiseptic cleaner which  kills germs and bonds with the skin to continue killing germs even after washing.     Please do not use if you have an allergy to CHG or antibacterial soaps. If your skin becomes reddened/irritated stop using the CHG.  Do not shave (including legs and underarms) for at least 48 hours prior to first CHG shower. It is OK to shave your face.  Please follow these instructions carefully.     Shower the NIGHT BEFORE SURGERY and the MORNING OF SURGERY with CHG Soap.   If you chose to wash your hair, wash your hair first as usual with your normal shampoo. After you shampoo, rinse your hair and body thoroughly to remove the shampoo.  Then [employer] and genitals (private parts) with your normal soap and rinse thoroughly to remove soap.  After that Use CHG Soap as you would any other liquid soap. You can apply CHG directly to the skin and wash gently with a scrungie or a clean washcloth.   Apply the CHG Soap to your body ONLY FROM THE NECK DOWN.  Do not use on open wounds or open sores. Avoid contact with your eyes, ears, mouth and genitals (private parts). Wash Face and genitals (private parts)  with your normal soap.   Wash thoroughly, paying special attention to the area where your surgery will be performed.  Thoroughly rinse your body with warm water from the neck down.  DO NOT shower/wash with your normal soap after using and rinsing off the CHG Soap.  Pat yourself dry with a CLEAN TOWEL.  Wear CLEAN PAJAMAS to bed the night before surgery  Place CLEAN SHEETS on your bed the night before your surgery  DO NOT SLEEP WITH PETS.   Day of Surgery:  Take a shower with CHG soap. Wear Clean/Comfortable clothing the morning of surgery Do not apply any deodorants/lotions.   Remember to brush your teeth WITH YOUR REGULAR TOOTHPASTE.    If you received a COVID test during your pre-op visit, it is requested that you wear a mask when out in public, stay away from anyone that may not be feeling  well, and notify your surgeon if you develop symptoms. If you have been in contact with anyone that has tested positive in the last 10 days, please notify your surgeon.    Please read over the following fact sheets that you were given.

## 2021-10-09 ENCOUNTER — Encounter (HOSPITAL_COMMUNITY): Payer: Self-pay

## 2021-10-09 ENCOUNTER — Encounter (HOSPITAL_COMMUNITY)
Admission: RE | Admit: 2021-10-09 | Discharge: 2021-10-09 | Disposition: A | Discharge status: Home / Self Care | Payer: 59 | Source: Ambulatory Visit | Attending: General Surgery | Admitting: General Surgery

## 2021-10-09 ENCOUNTER — Other Ambulatory Visit: Payer: Self-pay

## 2021-10-09 VITALS — BP 139/87 | HR 73 | Temp 97.8°F | Resp 17 | Ht 66.0 in | Wt 133.6 lb

## 2021-10-09 DIAGNOSIS — I493 Ventricular premature depolarization: Secondary | ICD-10-CM | POA: Diagnosis not present

## 2021-10-09 DIAGNOSIS — Z01818 Encounter for other preprocedural examination: Secondary | ICD-10-CM | POA: Diagnosis not present

## 2021-10-09 HISTORY — DX: Ventricular premature depolarization: I49.3

## 2021-10-09 HISTORY — DX: Cardiac arrhythmia, unspecified: I49.9

## 2021-10-09 LAB — BASIC METABOLIC PANEL
Anion gap: 6 (ref 5–15)
BUN: 13 mg/dL (ref 6–20)
CO2: 29 mmol/L (ref 22–32)
Calcium: 9.1 mg/dL (ref 8.9–10.3)
Chloride: 107 mmol/L (ref 98–111)
Creatinine, Ser: 0.76 mg/dL (ref 0.44–1.00)
GFR, Estimated: >60 mL/min (ref >60)
Glucose, Bld: 98 mg/dL (ref 70–99)
Potassium: 3.9 mmol/L (ref 3.5–5.1)
Sodium: 142 mmol/L (ref 135–145)

## 2021-10-09 LAB — CBC
HCT: 43.3 % (ref 36.0–46.0)
Hemoglobin: 14.7 g/dL (ref 12.0–15.0)
MCH: 33.6 pg (ref 26.0–34.0)
MCHC: 33.9 g/dL (ref 30.0–36.0)
MCV: 99.1 fL (ref 80.0–100.0)
Platelets: 268 10*3/uL (ref 150–400)
RBC: 4.37 MIL/uL (ref 3.87–5.11)
RDW: 11.9 % (ref 11.5–15.5)
WBC: 6.1 10*3/uL (ref 4.0–10.5)
nRBC: 0 % (ref 0.0–0.2)

## 2021-10-09 NOTE — Progress Notes (Signed)
For Short Stay: [place] appointment date: Date of COVID positive in last 49 days: NO  Bowel Prep reminder:   For Anesthesia: PCP - Mable Paris: FNP Cardiologist - Dr. Ida Rogue. LOV: 2018  Chest x-ray -  EKG -  Stress Test -  ECHO -  Cardiac Cath -  Pacemaker/ICD device last checked: Pacemaker orders received: Device Rep notified:  Spinal Cord Stimulator:  Sleep Study -  CPAP -   Fasting Blood Sugar -  Checks Blood Sugar _____ times a day Date and result of last Hgb A1c-  Blood Thinner Instructions: Aspirin Instructions: Last Dose:  Activity level: Can go up a flight of stairs and activities of daily living without stopping and without chest pain and/or shortness of breath   Able to exercise without chest pain and/or shortness of breath   Unable to go up a flight of stairs without chest pain and/or shortness of breath     Anesthesia review: Hx: PVC's,Palpitations.  Patient denies shortness of breath, fever, cough and chest pain at PAT appointment   Patient verbalized understanding of instructions that were given to them at the PAT appointment. Patient was also instructed that they will need to review over the PAT instructions again at home before surgery.

## 2021-10-10 ENCOUNTER — Other Ambulatory Visit: Payer: Self-pay

## 2021-10-10 ENCOUNTER — Telehealth: Payer: Self-pay

## 2021-10-10 DIAGNOSIS — I493 Ventricular premature depolarization: Secondary | ICD-10-CM | POA: Diagnosis not present

## 2021-10-10 DIAGNOSIS — Z01818 Encounter for other preprocedural examination: Secondary | ICD-10-CM | POA: Diagnosis not present

## 2021-10-10 DIAGNOSIS — C50311 Malignant neoplasm of lower-inner quadrant of right female breast: Secondary | ICD-10-CM

## 2021-10-10 LAB — HCG, QUANTITATIVE, PREGNANCY: hCG, Beta Chain, Quant, S: 1 m[IU]/mL (ref <5)

## 2021-10-10 NOTE — Telephone Encounter (Signed)
Research nurse called patient to ascertain her interest in participating in the [employer] blood study after she read the protocol consent form provided to her on 10/01/2021. The patient does want to participate. Consent visit with labs scheduled for 10/11/2021 at 0830 am per patient request. Patient will meet research nurse at the research office.  Jeral Fruit, RN 10/10/21 2:08 PM

## 2021-10-10 NOTE — Progress Notes (Signed)
Anesthesia Chart Review:  Case: [id number] Date/Time: 10/17/21 1115   Procedure: RIGHT BREAST LUMPECTOMY WITH RADIOACTIVE SEED AND AXILLARY SENTINEL LYMPH NODE BIOPSY (Right: Breast)   Anesthesia type: General   Pre-op diagnosis: RIGHT BREAST CANCER   Location: [place] OR ROOM 09 / [place] OR   Surgeons: Rolm Bookbinder, MD       DISCUSSION: Patient is a 46 year old female scheduled for the above procedure.  History includes never smoker, palpitations, dysrhythmia (sinus arrhythmia, rare APCs, PVCs 2015 Holter monitor; on propranolol), skin cancer (BCC excision), hemochromatosis (heterozygous), right breast cancer (09/25/21 right breat biopsy: invasive mammary carcinoma)  She is scheduled for RSL on 10/16/21. Urine pregnancy test was not done at PAT visit, so I contacted our main Lab. There were able to add-on serum quantitative (not qualitative) to PAT labs--result was 1 which is consistent with a non-pregnant female. LMP 10/02/21.  Anesthesia team to evaluate on the day of surgery.    VS: BP 139/87   Pulse 73   Temp 36.6 C (Oral)   Resp 17   Ht '5\' 6"'$  (1.676 m)   Wt 60.6 kg   LMP 10/02/2021   SpO2 100%   BMI 21.56 kg/m    PROVIDERS: [name], FNP is PCP  Randa Evens, MD is HEM-ONC. PCP communicated 09/09/21 labs showing iron 195, ferritin 75.2, saturation ratios 53.2%. Ferritin goal of < 100, so yearly labs planned. She has not required phlebotomy recently.  - She is not routinely followed by cardiology, but had evaluation with Ida Rogue, MD in 2015 and Murray Hodgkins, NP in 2017 for palpitations.   LABS: Labs reviewed: Acceptable for surgery. A1c 5.0% on 09/09/21.  (all labs ordered are listed, but only abnormal results are displayed)  Labs Reviewed  CBC  BASIC METABOLIC PANEL     EKG: 10/13/81: Normal sinus rhythm Normal ECG No previous ECGs available Confirmed by Gwyndolyn Kaufman [phone]) on 10/09/2021 12:36:53 PM   CV: 48 hour Holter monitor 04/28/13:   NSR with rare APCs, PVCs. Sinus arrhythmia noted.   Past Medical History:  Diagnosis Date   Atopic dermatitis    Basal cell carcinoma    15 years ago.   Dysrhythmia    Hemochromatosis    Heterozygous for two genes   Palpitations    a. 04/2013 48h Holter: sinus rhythm/sinus arrhythmia, rare PAC's/PVC's.   PVC (premature ventricular contraction)    Shoulder pain, left 01/24/2015   Shoulder pain, right 01/24/2015    Past Surgical History:  Procedure Laterality Date   BREAST BIOPSY Right 09/25/2021   COLONOSCOPY WITH PROPOFOL N/A 01/24/2021   Procedure: COLONOSCOPY WITH PROPOFOL;  Surgeon: [name], MD;  Location: [facility] ENDOSCOPY;  Service: Endoscopy;  Laterality: N/A;   Excision of basal cell carcinoma     WISDOM TOOTH EXTRACTION      MEDICATIONS:  acetaminophen (TYLENOL) 500 MG tablet   cholecalciferol (VITAMIN D) 1000 units tablet   Cyanocobalamin (VITAMIN B 12 PO)   ibuprofen (ADVIL,MOTRIN) 200 MG tablet   levonorgestrel-ethinyl estradiol (AVIANE) 0.1-20 MG-MCG tablet   propranolol (INDERAL) 20 MG tablet   tacrolimus (PROTOPIC) 0.1 % ointment   No current facility-administered medications for this encounter.    Myra Gianotti, PA-C Surgical Short Stay/Anesthesiology [facility] Phone [phone] [facility] Phone [phone] 10/10/2021 3:07 PM

## 2021-10-10 NOTE — Anesthesia Preprocedure Evaluation (Addendum)
Anesthesia Evaluation  Patient identified by MRN, date of birth, ID band Patient awake    Reviewed: Allergy & Precautions, NPO status , Patient's Chart, lab work & pertinent test results  Airway Mallampati: I  TM Distance: >3 FB Neck ROM: Full    Dental no notable dental hx. (+) Teeth Intact, Dental Advisory Given   Pulmonary neg pulmonary ROS,    Pulmonary exam normal breath sounds clear to auscultation       Cardiovascular Normal cardiovascular exam+ dysrhythmias (PVCs on inderal)  Rhythm:Regular Rate:Normal     Neuro/Psych  Headaches, negative psych ROS   GI/Hepatic negative GI ROS, Neg liver ROS,   Endo/Other  negative endocrine ROS  Renal/GU negative Renal ROS  negative genitourinary   Musculoskeletal negative musculoskeletal ROS (+)   Abdominal   Peds negative pediatric ROS (+)  Hematology negative hematology ROS (+)   Anesthesia Other Findings   Reproductive/Obstetrics negative OB ROS                             Anesthesia Physical Anesthesia Plan  ASA: 2  Anesthesia Plan: General and Regional   Post-op Pain Management: Regional block*, Tylenol PO (pre-op)* and Toradol IV (intra-op)*   Induction: Intravenous  PONV Risk Score and Plan: 3 and Ondansetron, Dexamethasone, Midazolam and Treatment may vary due to age or medical condition  Airway Management Planned: LMA  Additional Equipment: None  Intra-op Plan:   Post-operative Plan: Extubation in OR  Informed Consent: I have reviewed the patients History and Physical, chart, labs and discussed the procedure including the risks, benefits and alternatives for the proposed anesthesia with the patient or authorized representative who has indicated his/her understanding and acceptance.     Dental advisory given  Plan Discussed with: CRNA  Anesthesia Plan Comments:        Anesthesia Quick Evaluation

## 2021-10-11 ENCOUNTER — Inpatient Hospital Stay: Payer: 59 | Attending: Oncology

## 2021-10-11 ENCOUNTER — Inpatient Hospital Stay: Payer: 59

## 2021-10-11 DIAGNOSIS — C50311 Malignant neoplasm of lower-inner quadrant of right female breast: Secondary | ICD-10-CM

## 2021-10-11 NOTE — Research (Addendum)
This Nurse has reviewed this patient's inclusion and exclusion criteria as a second review and confirms Adrienne Harris is eligible for study participation.  Patient may continue with enrollment.   Maxwell Marion, RN, BSN, [place] Clinical Research Nurse Lead 10/11/2021 9:38 AM    Trial: [facility]-015 - Tissue and Bodily Fluids: Translational Medicine: Discovery and Evaluation of Biomarkers/Pharmacogenomics for the Diagnosis and Personalized Management of Patients    Patient Adrienne Harris was identified by Jeral Fruit, RN as a potential candidate for the above listed study.  This Clinical Research Nurse met with Adrienne Harris, Adrienne Harris, on 10/11/21 in a manner and location that ensures patient privacy to discuss participation in the above listed research study.  Patient is Unaccompanied.  A copy of the informed consent document and separate HIPAA Authorization was provided to the patient.  Patient reads, speaks, and understands Adrienne Harris.    Patient was provided with the business card of this Nurse and encouraged to contact the research team with any questions.  Patient was provided the option of taking informed consent documents home to review and was encouraged to review at their convenience with their support network, including other care providers. Patient took the consent documents home to review on October 01, 2021.   As outlined in the informed consent form, this Nurse and Adrienne Harris discussed the purpose of the research study, the investigational nature of the study, study procedures and requirements for study participation, potential risks and benefits of study participation, as well as alternatives to participation. This study is not blinded. The patient understands participation is voluntary and they may withdraw from study participation at any time.  This study does not involve randomization.  This study does not involve an investigational drug or device. This study does not involve a  placebo. Patient understands enrollment is pending full eligibility review.   Confidentiality and how the patient's information will be used as part of study participation were discussed.  Patient was informed there is reimbursement provided for their time and effort spent on trial participation.  The patient is encouraged to discuss research study participation with their insurance provider to determine what costs they may incur as part of study participation, including research related injury.    All questions were answered to patient's satisfaction.  The informed consent and separate HIPAA Authorization was reviewed page by page.  The patient's mental and emotional status is appropriate to provide informed consent, and the patient verbalizes an understanding of study participation.  Patient has agreed to participate in the above listed research study and has voluntarily signed the informed consent version 7.0 and separate HIPAA Authorization, version dated 12/31/2020  on 10/11/21 at 0841AM.  The patient was provided with a copy of the signed informed consent form and separate HIPAA Authorization for their reference.  No study specific procedures were obtained prior to the signing of the informed consent document.  Approximately 20 minutes were spent with the patient reviewing the informed consent documents.  After obtaining informed consent patient, voluntarily signed the optional Release of Information form for use throughout trial participation.   Patient was escorted to the lab by Adrienne Harris, CRS and Adrienne Miu, RN for study lab collection after her consent was signed. Adrienne Harris gave the patient her gift card for reimbursement after her blood was obtained.   The following social, medical, and family history was collected from the patient on 10/11/21.  Date and time of last meal prior to blood collection: 0630 am  10/11/2021  Does the patient drink alcohol? Yes  Does the patient use tobacco  products? No  Menopausal status? perimenopausal  Date of last menstrual cycle? 10/02/2021  Does the patient have a personal history of cancer? No   If yes, what type and dates of diagnosis/treatment: N/A  Does the patient have family history of cancer in immediate family (grandparents, parents, and/or siblings)? Yes   If yes, what was the relationship, cancer type, date of diagnosis, and outcome for each? Father- skin melanoma, Paternal Aunt- Breast and Kidney  Has the patient received a COVID-19 vaccination? Yes   If yes, vaccine manufacturer? [place]  Number of doses received? 3  Date of last dose? Unknown  Has the patient ever tested positive for COVID? Yes   If yes, date of last positive COVID test? 09/2020  Date of last COVID test taken? 09/2020  Has the patient received any other vaccines in the past year? Yes   If yes, which vaccines and dates? Flu  The patient's medication list was reviewed with the patient and it was correct and start dates were verified. Has the patient stopped any medications in the past 30 days? Yes - she stopped taking her birth control pills  Jeral Fruit, RN 10/11/21 9:30 AM

## 2021-10-16 DIAGNOSIS — C50911 Malignant neoplasm of unspecified site of right female breast: Secondary | ICD-10-CM | POA: Diagnosis not present

## 2021-10-17 DIAGNOSIS — Z01818 Encounter for other preprocedural examination: Secondary | ICD-10-CM

## 2021-10-18 ENCOUNTER — Telehealth: Payer: Self-pay | Admitting: Licensed Clinical Social Worker

## 2021-10-18 NOTE — Telephone Encounter (Signed)
I contacted Ms. Collazos to discuss her genetic testing results. No pathogenic variants were identified in the initial 9 genes analyzed with highest risks for breast cancer. Remainder of testing is pending, we will reach out once it is back. Detailed clinic note to follow.      Faith Rogue, MS, [facility] Genetic Counselor [email or web address] Phone: [phone]

## 2021-10-21 ENCOUNTER — Other Ambulatory Visit: Payer: Self-pay

## 2021-10-21 ENCOUNTER — Encounter (HOSPITAL_BASED_OUTPATIENT_CLINIC_OR_DEPARTMENT_OTHER): Payer: Self-pay | Admitting: General Surgery

## 2021-10-23 ENCOUNTER — Ambulatory Visit
Admission: RE | Admit: 2021-10-23 | Discharge: 2021-10-23 | Disposition: A | Discharge status: Home / Self Care | Payer: 59 | Source: Ambulatory Visit | Attending: General Surgery | Admitting: General Surgery

## 2021-10-23 ENCOUNTER — Other Ambulatory Visit: Payer: Self-pay | Admitting: General Surgery

## 2021-10-23 DIAGNOSIS — C50311 Malignant neoplasm of lower-inner quadrant of right female breast: Secondary | ICD-10-CM

## 2021-10-23 DIAGNOSIS — C50811 Malignant neoplasm of overlapping sites of right female breast: Secondary | ICD-10-CM | POA: Diagnosis not present

## 2021-10-24 ENCOUNTER — Ambulatory Visit: Payer: Self-pay | Admitting: Licensed Clinical Social Worker

## 2021-10-24 ENCOUNTER — Encounter: Payer: Self-pay | Admitting: Licensed Clinical Social Worker

## 2021-10-24 DIAGNOSIS — H5213 Myopia, bilateral: Secondary | ICD-10-CM | POA: Diagnosis not present

## 2021-10-24 DIAGNOSIS — Z1379 Encounter for other screening for genetic and chromosomal anomalies: Secondary | ICD-10-CM

## 2021-10-24 NOTE — Progress Notes (Signed)
HPI:   Adrienne Harris was previously seen in the [facility] due to a personal and family history of cancer and concerns regarding a hereditary predisposition to cancer. Please refer to our prior cancer genetics clinic note for more information regarding our discussion, assessment and recommendations, at the time. Adrienne Harris recent genetic test results were disclosed to her, as were recommendations warranted by these results. These results and recommendations are discussed in more detail below.  CANCER HISTORY:  Oncology History  Malignant neoplasm of lower-inner quadrant of right breast of female, estrogen receptor positive ([facility])  10/01/2021 Initial Diagnosis   Malignant neoplasm of lower-inner quadrant of right breast of female, estrogen receptor positive ([place])   10/01/2021 Cancer Staging   Staging form: Breast, AJCC 8th Edition - Clinical stage from 10/01/2021: Stage IA (cT1b, cN0, cM0, G3, ER+, PR+, HER2-) - Signed by [name], MD on 10/01/2021 Histologic grading system: 3 grade system    Genetic Testing   Negative genetic testing. No pathogenic variants identified on the [facility] STAT Breast Cancer Panel + Multi-Cancer+RNA panel. VUS in APC called c.2666A>G identified. The final report date is 10/23/2021.  The Multi-Cancer Panel + RNA offered by [facility] includes sequencing and/or deletion duplication testing of the following 84 genes: AIP, ALK, APC, ATM, AXIN2,BAP1,  BARD1, BLM, BMPR1A, BRCA1, BRCA2, BRIP1, CASR, CDC73, CDH1, CDK4, CDKN1B, CDKN1C, CDKN2A (p14ARF), CDKN2A (p16INK4a), CEBPA, CHEK2, CTNNA1, DICER1, DIS3L2, EGFR (c.2369C>T, p.Thr790Met variant only), EPCAM (Deletion/duplication testing only), FH, FLCN, GATA2, GPC3, GREM1 (Promoter region deletion/duplication testing only), HOXB13 (c.251G>A, p.Gly84Glu), HRAS, KIT, MAX, MEN1, MET, MITF (c.952G>A, p.Glu318Lys variant only), MLH1, MSH2, MSH3, MSH6, MUTYH, NBN, NF1, NF2, NTHL1, PALB2, PDGFRA, PHOX2B, PMS2, POLD1,  POLE, POT1, PRKAR1A, PTCH1, PTEN, RAD50, RAD51C, RAD51D, RB1, RECQL4, RET, RUNX1, SDHAF2, SDHA (sequence changes only), SDHB, SDHC, SDHD, SMAD4, SMARCA4, SMARCB1, SMARCE1, STK11, SUFU, TERC, TERT, TMEM127, TP53, TSC1, TSC2, VHL, WRN and WT1.     FAMILY HISTORY:  We obtained a detailed, 4-generation family history.  Significant diagnoses are listed below: Family History  Problem Relation Age of Onset   Hemochromatosis Mother    Basal cell carcinoma Mother    Hypertension Father    Melanoma Father        multiple removed   Colon polyps Father 69       adenomatous   Breast cancer Paternal Aunt 70   Kidney cancer Paternal Aunt    Alzheimer's disease Paternal Aunt    Hyperlipidemia Maternal Grandmother    Hypertension Maternal Grandmother    Osteoporosis Maternal Grandmother    Hyperlipidemia Maternal Grandfather    Hypertension Maternal Grandfather    Diabetes Maternal Grandfather    Stroke Maternal Grandfather    Alzheimer's disease Paternal Grandmother    Breast cancer Other        late years   Breast cancer Other        late years   Colon cancer Neg Hx      Adrienne Harris has 1 daughter, 33. She has 1 brother, 73, with no cancer history.   Adrienne Harris mother is living at 76 and has had basal cell carcinoma. Patient has 1 maternal uncle, no cancers. Maternal grandmother passed at 74. Grandfather passed in his 20s-80s, two of his sisters had breast cancer in their 5s-70s.   Adrienne Harris father has had approximately 10 colon polyps and multiple melanomas removed, he is living at 30. Patient has 1 paternal aunt, 1 uncle. Her aunt had breast cancer in her  50s-60s and then kidney cancer, she passed at 62. No cancers for paternal grandparents.   Adrienne Harris is unaware of previous family history of genetic testing for hereditary cancer risks. There is no reported Ashkenazi Jewish ancestry. There is no known consanguinity.      GENETIC TEST RESULTS:  The [facility] Breast Cancer STAT +  Multi-Cancer+RNA Panel found no pathogenic mutations.   The Multi-Cancer Panel + RNA offered by [facility] includes sequencing and/or deletion duplication testing of the following 84 genes: AIP, ALK, APC, ATM, AXIN2,BAP1,  BARD1, BLM, BMPR1A, BRCA1, BRCA2, BRIP1, CASR, CDC73, CDH1, CDK4, CDKN1B, CDKN1C, CDKN2A (p14ARF), CDKN2A (p16INK4a), CEBPA, CHEK2, CTNNA1, DICER1, DIS3L2, EGFR (c.2369C>T, p.Thr790Met variant only), EPCAM (Deletion/duplication testing only), FH, FLCN, GATA2, GPC3, GREM1 (Promoter region deletion/duplication testing only), HOXB13 (c.251G>A, p.Gly84Glu), HRAS, KIT, MAX, MEN1, MET, MITF (c.952G>A, p.Glu318Lys variant only), MLH1, MSH2, MSH3, MSH6, MUTYH, NBN, NF1, NF2, NTHL1, PALB2, PDGFRA, PHOX2B, PMS2, POLD1, POLE, POT1, PRKAR1A, PTCH1, PTEN, RAD50, RAD51C, RAD51D, RB1, RECQL4, RET, RUNX1, SDHAF2, SDHA (sequence changes only), SDHB, SDHC, SDHD, SMAD4, SMARCA4, SMARCB1, SMARCE1, STK11, SUFU, TERC, TERT, TMEM127, TP53, TSC1, TSC2, VHL, WRN and WT1.  The test report has been scanned into EPIC and is located under the Molecular Pathology section of the Results Review tab.  A portion of the result report is included below for reference. Genetic testing reported out on 10/23/2021.      Genetic testing identified a variant of uncertain significance (VUS) in the APC gene called c.2666A>G.  At this time, it is unknown if this variant is associated with an increased risk for cancer or if it is benign, but most uncertain variants are reclassified to benign. It should not be used to make medical management decisions. With time, we suspect the laboratory will determine the significance of this variant, if any. If the laboratory reclassifies this variant, we will attempt to contact Adrienne Harris to discuss it further.   Even though a pathogenic variant was not identified, possible explanations for the cancer in the family may include: There may be no hereditary risk for cancer in the family. The cancers in  Adrienne Harris and/or her family may be sporadic/familial or due to other genetic and environmental factors. There may be a gene mutation in one of these genes that current testing methods cannot detect but that chance is small. There could be another gene that has not yet been discovered, or that we have not yet tested, that is responsible for the cancer diagnoses in the family.  It is also possible there is a hereditary cause for the cancer in the family that Adrienne Harris did not inherit. The variant of uncertain significance detected in the APC gene may be reclassified as a pathogenic variant in the future. At this time, we do not know if this variant increases the risk for cancer.  Therefore, it is important to remain in touch with cancer genetics in the future so that we can continue to offer Adrienne Harris the most up to date genetic testing.   ADDITIONAL GENETIC TESTING:  We discussed with Adrienne Harris that her genetic testing was fairly extensive.  If there are additional relevant genes identified to increase cancer risk that can be analyzed in the future, we would be happy to discuss and coordinate this testing at that time.    CANCER SCREENING RECOMMENDATIONS:  Adrienne Harris test result is considered negative (normal).  This means that we have not identified a hereditary cause for her personal and family history of  cancer at this time.   An individual's cancer risk and medical management are not determined by genetic test results alone. Overall cancer risk assessment incorporates additional factors, including personal medical history, family history, and any available genetic information that may result in a personalized plan for cancer prevention and surveillance. Therefore, it is recommended she continue to follow the cancer management and screening guidelines provided by her oncology and primary healthcare provider.  RECOMMENDATIONS FOR FAMILY MEMBERS:   Since she did not inherit a identifiable mutation  in a cancer predisposition gene included on this panel, her children could not have inherited a known mutation from her in one of these genes. Individuals in this family might be at some increased risk of developing cancer, over the general population risk, due to the family history of cancer.  Individuals in the family should notify their providers of the family history of cancer. We recommend women in this family have a yearly mammogram beginning at age 65, or 55 years younger than the earliest onset of cancer, an annual clinical breast exam, and perform monthly breast self-exams.  Family members should have colonoscopies by at age 58, or earlier, as recommended by their providers. We do not recommend familial testing for the APC variant of uncertain significance (VUS).  FOLLOW-UP:  Lastly, we discussed with Adrienne Harris that cancer genetics is a rapidly advancing field and it is possible that new genetic tests will be appropriate for her and/or her family members in the future. We encouraged her to remain in contact with cancer genetics on an annual basis so we can update her personal and family histories and let her know of advances in cancer genetics that may benefit this family.   Our contact number was provided. Adrienne Harris questions were answered to her satisfaction, and she knows she is welcome to call us at anytime with additional questions or concerns.    Faith Rogue, MS, [facility] Genetic Counselor [email or web address] Phone: [phone]

## 2021-10-28 ENCOUNTER — Other Ambulatory Visit: Payer: Self-pay

## 2021-10-28 ENCOUNTER — Ambulatory Visit (HOSPITAL_BASED_OUTPATIENT_CLINIC_OR_DEPARTMENT_OTHER): Payer: 59 | Admitting: Vascular Surgery

## 2021-10-28 ENCOUNTER — Encounter (HOSPITAL_BASED_OUTPATIENT_CLINIC_OR_DEPARTMENT_OTHER)
Admission: RE | Disposition: A | Discharge status: Home / Self Care | Payer: Self-pay | Source: Home / Self Care | Attending: General Surgery

## 2021-10-28 ENCOUNTER — Other Ambulatory Visit (HOSPITAL_COMMUNITY): Payer: Self-pay

## 2021-10-28 ENCOUNTER — Ambulatory Visit
Admission: RE | Admit: 2021-10-28 | Discharge: 2021-10-28 | Disposition: A | Discharge status: Home / Self Care | Payer: 59 | Source: Ambulatory Visit | Attending: General Surgery | Admitting: General Surgery

## 2021-10-28 ENCOUNTER — Ambulatory Visit (HOSPITAL_BASED_OUTPATIENT_CLINIC_OR_DEPARTMENT_OTHER)
Admission: RE | Admit: 2021-10-28 | Discharge: 2021-10-28 | Disposition: A | Discharge status: Home / Self Care | Payer: 59 | Attending: General Surgery | Admitting: General Surgery

## 2021-10-28 ENCOUNTER — Encounter (HOSPITAL_BASED_OUTPATIENT_CLINIC_OR_DEPARTMENT_OTHER): Payer: Self-pay | Admitting: General Surgery

## 2021-10-28 DIAGNOSIS — C50912 Malignant neoplasm of unspecified site of left female breast: Secondary | ICD-10-CM | POA: Diagnosis not present

## 2021-10-28 DIAGNOSIS — C50311 Malignant neoplasm of lower-inner quadrant of right female breast: Secondary | ICD-10-CM | POA: Diagnosis not present

## 2021-10-28 DIAGNOSIS — N6089 Other benign mammary dysplasias of unspecified breast: Secondary | ICD-10-CM | POA: Insufficient documentation

## 2021-10-28 DIAGNOSIS — N6021 Fibroadenosis of right breast: Secondary | ICD-10-CM | POA: Diagnosis not present

## 2021-10-28 DIAGNOSIS — D0511 Intraductal carcinoma in situ of right breast: Secondary | ICD-10-CM | POA: Insufficient documentation

## 2021-10-28 DIAGNOSIS — N6489 Other specified disorders of breast: Secondary | ICD-10-CM | POA: Insufficient documentation

## 2021-10-28 DIAGNOSIS — Z170 Estrogen receptor positive status [ER+]: Secondary | ICD-10-CM | POA: Diagnosis not present

## 2021-10-28 DIAGNOSIS — G8918 Other acute postprocedural pain: Secondary | ICD-10-CM | POA: Diagnosis not present

## 2021-10-28 DIAGNOSIS — C50911 Malignant neoplasm of unspecified site of right female breast: Secondary | ICD-10-CM | POA: Diagnosis not present

## 2021-10-28 DIAGNOSIS — Z01818 Encounter for other preprocedural examination: Secondary | ICD-10-CM

## 2021-10-28 HISTORY — PX: BREAST LUMPECTOMY: SHX2

## 2021-10-28 HISTORY — PX: BREAST LUMPECTOMY WITH RADIOACTIVE SEED AND SENTINEL LYMPH NODE BIOPSY: SHX6550

## 2021-10-28 LAB — POCT PREGNANCY, URINE: Preg Test, Ur: NEGATIVE

## 2021-10-28 SURGERY — BREAST LUMPECTOMY WITH RADIOACTIVE SEED AND SENTINEL LYMPH NODE BIOPSY
Anesthesia: General | Site: Breast | Laterality: Right

## 2021-10-28 MED ORDER — BUPIVACAINE LIPOSOME 1.3 % IJ SUSP
INTRAMUSCULAR | Status: DC | PRN
  Administered 2021-10-28: 10 mL via PERINEURAL

## 2021-10-28 MED ORDER — LIDOCAINE HCL (CARDIAC) PF 100 MG/5ML IV SOSY
PREFILLED_SYRINGE | INTRAVENOUS | Status: DC | PRN
  Administered 2021-10-28: 30 mg via INTRAVENOUS

## 2021-10-28 MED ORDER — PROPOFOL 10 MG/ML IV BOLUS
INTRAVENOUS | Status: DC | PRN
  Administered 2021-10-28: 150 mg via INTRAVENOUS

## 2021-10-28 MED ORDER — PROPOFOL 500 MG/50ML IV EMUL
INTRAVENOUS | Status: DC | PRN
  Administered 2021-10-28: 25 ug/kg/min via INTRAVENOUS

## 2021-10-28 MED ORDER — ONDANSETRON HCL 4 MG/2ML IJ SOLN
INTRAMUSCULAR | Status: AC
  Filled 2021-10-28: qty 2

## 2021-10-28 MED ORDER — ACETAMINOPHEN 500 MG PO TABS
1000.0000 mg | ORAL_TABLET | ORAL | Status: AC
  Administered 2021-10-28: 1000 mg via ORAL

## 2021-10-28 MED ORDER — DEXAMETHASONE SODIUM PHOSPHATE 4 MG/ML IJ SOLN
INTRAMUSCULAR | Status: DC | PRN
  Administered 2021-10-28: 10 mg via INTRAVENOUS

## 2021-10-28 MED ORDER — ORAL CARE MOUTH RINSE: 15.0000 mL | Freq: Once | OROMUCOSAL | Status: DC

## 2021-10-28 MED ORDER — TRAMADOL HCL 50 MG PO TABS
50.0000 mg | ORAL_TABLET | Freq: Four times a day (QID) | ORAL | 0 refills | Status: DC | PRN
  Filled 2021-10-28: qty 10, 3d supply, fill #0

## 2021-10-28 MED ORDER — ENSURE PRE-SURGERY PO LIQD: 296.0000 mL | Freq: Once | ORAL | Status: DC

## 2021-10-28 MED ORDER — CEFAZOLIN SODIUM-DEXTROSE 2-4 GM/100ML-% IV SOLN
INTRAVENOUS | Status: AC
  Filled 2021-10-28: qty 100

## 2021-10-28 MED ORDER — ACETAMINOPHEN 500 MG PO TABS
ORAL_TABLET | ORAL | Status: AC
  Filled 2021-10-28: qty 2

## 2021-10-28 MED ORDER — MIDAZOLAM HCL 2 MG/2ML IJ SOLN
INTRAMUSCULAR | Status: AC
  Filled 2021-10-28: qty 2

## 2021-10-28 MED ORDER — LIDOCAINE 2% (20 MG/ML) 5 ML SYRINGE
INTRAMUSCULAR | Status: AC
  Filled 2021-10-28: qty 5

## 2021-10-28 MED ORDER — MAGTRACE LYMPHATIC TRACER
INTRAMUSCULAR | Status: DC | PRN
  Administered 2021-10-28: 2 mL via INTRAMUSCULAR

## 2021-10-28 MED ORDER — KETOROLAC TROMETHAMINE 15 MG/ML IJ SOLN
15.0000 mg | INTRAMUSCULAR | Status: AC
  Administered 2021-10-28: 15 mg via INTRAVENOUS

## 2021-10-28 MED ORDER — FENTANYL CITRATE (PF) 100 MCG/2ML IJ SOLN
INTRAMUSCULAR | Status: DC | PRN
  Administered 2021-10-28: 50 ug via INTRAVENOUS

## 2021-10-28 MED ORDER — CEFAZOLIN SODIUM-DEXTROSE 2-4 GM/100ML-% IV SOLN
2.0000 g | INTRAVENOUS | Status: AC
  Administered 2021-10-28: 2 g via INTRAVENOUS

## 2021-10-28 MED ORDER — PROPOFOL 10 MG/ML IV BOLUS
INTRAVENOUS | Status: AC
  Filled 2021-10-28: qty 20

## 2021-10-28 MED ORDER — FENTANYL CITRATE (PF) 100 MCG/2ML IJ SOLN
INTRAMUSCULAR | Status: AC
  Filled 2021-10-28: qty 2

## 2021-10-28 MED ORDER — 0.9 % SODIUM CHLORIDE (POUR BTL) OPTIME
TOPICAL | Status: DC | PRN
  Administered 2021-10-28: 100 mL

## 2021-10-28 MED ORDER — EPHEDRINE 5 MG/ML INJ
INTRAVENOUS | Status: AC
  Filled 2021-10-28: qty 10

## 2021-10-28 MED ORDER — KETOROLAC TROMETHAMINE 15 MG/ML IJ SOLN
INTRAMUSCULAR | Status: AC
  Filled 2021-10-28: qty 1

## 2021-10-28 MED ORDER — MIDAZOLAM HCL 2 MG/2ML IJ SOLN
2.0000 mg | Freq: Once | INTRAMUSCULAR | Status: AC
  Administered 2021-10-28: 2 mg via INTRAVENOUS

## 2021-10-28 MED ORDER — ROPIVACAINE HCL 5 MG/ML IJ SOLN
INTRAMUSCULAR | Status: DC | PRN
  Administered 2021-10-28: 20 mL via PERINEURAL

## 2021-10-28 MED ORDER — LACTATED RINGERS IV SOLN: INTRAVENOUS | Status: DC

## 2021-10-28 MED ORDER — BUPIVACAINE HCL (PF) 0.25 % IJ SOLN
INTRAMUSCULAR | Status: DC | PRN
  Administered 2021-10-28: 3 mL

## 2021-10-28 MED ORDER — CHLORHEXIDINE GLUCONATE 0.12 % MT SOLN: 15.0000 mL | Freq: Once | OROMUCOSAL | Status: DC

## 2021-10-28 MED ORDER — FENTANYL CITRATE (PF) 100 MCG/2ML IJ SOLN
100.0000 ug | Freq: Once | INTRAMUSCULAR | Status: AC
  Administered 2021-10-28: 100 ug via INTRAVENOUS

## 2021-10-28 MED ORDER — EPHEDRINE SULFATE (PRESSORS) 50 MG/ML IJ SOLN
INTRAMUSCULAR | Status: DC | PRN
  Administered 2021-10-28: 5 mg via INTRAVENOUS
  Administered 2021-10-28: 10 mg via INTRAVENOUS

## 2021-10-28 MED ORDER — PHENYLEPHRINE HCL (PRESSORS) 10 MG/ML IV SOLN
INTRAVENOUS | Status: DC | PRN
  Administered 2021-10-28: 80 ug via INTRAVENOUS

## 2021-10-28 SURGICAL SUPPLY — 56 items
ADH SKN CLS APL DERMABOND .7 (GAUZE/BANDAGES/DRESSINGS) ×1
APL PRP STRL LF DISP 70% ISPRP (MISCELLANEOUS) ×1
APPLIER CLIP 9.375 MED OPEN (MISCELLANEOUS)
APR CLP MED 9.3 20 MLT OPN (MISCELLANEOUS)
BINDER BREAST LRG (GAUZE/BANDAGES/DRESSINGS) IMPLANT
BINDER BREAST MEDIUM (GAUZE/BANDAGES/DRESSINGS) IMPLANT
BINDER BREAST XLRG (GAUZE/BANDAGES/DRESSINGS) IMPLANT
BINDER BREAST XXLRG (GAUZE/BANDAGES/DRESSINGS) IMPLANT
BLADE SURG 15 STRL LF DISP TIS (BLADE) ×1 IMPLANT
BLADE SURG 15 STRL SS (BLADE) ×1
CANISTER SUC SOCK COL 7IN (MISCELLANEOUS) IMPLANT
CANISTER SUCT 1200ML W/VALVE (MISCELLANEOUS) IMPLANT
CHLORAPREP W/TINT 26 (MISCELLANEOUS) ×1 IMPLANT
CLIP APPLIE 9.375 MED OPEN (MISCELLANEOUS) IMPLANT
CLIP TI WIDE RED SMALL 6 (CLIP) ×1 IMPLANT
COVER BACK TABLE 60X90IN (DRAPES) ×1 IMPLANT
COVER MAYO STAND STRL (DRAPES) ×1 IMPLANT
COVER PROBE W GEL 5X96 (DRAPES) ×1 IMPLANT
DERMABOND ADVANCED .7 DNX12 (GAUZE/BANDAGES/DRESSINGS) ×1 IMPLANT
DRAPE LAPAROSCOPIC ABDOMINAL (DRAPES) ×1 IMPLANT
DRAPE UTILITY XL STRL (DRAPES) ×1 IMPLANT
ELECT COATED BLADE 2.86 ST (ELECTRODE) ×1 IMPLANT
ELECT REM PT RETURN 9FT ADLT (ELECTROSURGICAL) ×1
ELECTRODE REM PT RTRN 9FT ADLT (ELECTROSURGICAL) ×1 IMPLANT
GLOVE BIO SURGEON STRL SZ7 (GLOVE) ×2 IMPLANT
GLOVE BIOGEL PI IND STRL 7.5 (GLOVE) ×1 IMPLANT
GOWN STRL REUS W/ TWL LRG LVL3 (GOWN DISPOSABLE) ×2 IMPLANT
GOWN STRL REUS W/TWL LRG LVL3 (GOWN DISPOSABLE) ×2
HEMOSTAT ARISTA ABSORB 3G PWDR (HEMOSTASIS) IMPLANT
KIT MARKER MARGIN INK (KITS) ×1 IMPLANT
NDL HYPO 25X1 1.5 SAFETY (NEEDLE) ×1 IMPLANT
NDL SAFETY ECLIP 18X1.5 (MISCELLANEOUS) IMPLANT
NEEDLE HYPO 25X1 1.5 SAFETY (NEEDLE) ×1 IMPLANT
NS IRRIG 1000ML POUR BTL (IV SOLUTION) IMPLANT
PACK BASIN DAY SURGERY FS (CUSTOM PROCEDURE TRAY) ×1 IMPLANT
PENCIL SMOKE EVACUATOR (MISCELLANEOUS) ×1 IMPLANT
RETRACTOR ONETRAX LX 90X20 (MISCELLANEOUS) IMPLANT
SLEEVE SCD COMPRESS KNEE MED (STOCKING) ×1 IMPLANT
SPIKE FLUID TRANSFER (MISCELLANEOUS) IMPLANT
SPONGE T-LAP 4X18 ~~LOC~~+RFID (SPONGE) ×1 IMPLANT
STRIP CLOSURE SKIN 1/2X4 (GAUZE/BANDAGES/DRESSINGS) ×1 IMPLANT
SUT ETHILON 2 0 FS 18 (SUTURE) IMPLANT
SUT MNCRL AB 4-0 PS2 18 (SUTURE) ×1 IMPLANT
SUT MON AB 5-0 PS2 18 (SUTURE) IMPLANT
SUT SILK 2 0 SH (SUTURE) IMPLANT
SUT VIC AB 2-0 SH 27 (SUTURE) ×2
SUT VIC AB 2-0 SH 27XBRD (SUTURE) ×1 IMPLANT
SUT VIC AB 3-0 SH 27 (SUTURE) ×1
SUT VIC AB 3-0 SH 27X BRD (SUTURE) ×1 IMPLANT
SUT VIC AB 5-0 PS2 18 (SUTURE) IMPLANT
SYR CONTROL 10ML LL (SYRINGE) ×1 IMPLANT
TOWEL GREEN STERILE FF (TOWEL DISPOSABLE) ×1 IMPLANT
TRACER MAGTRACE VIAL (MISCELLANEOUS) IMPLANT
TRAY FAXITRON CT DISP (TRAY / TRAY PROCEDURE) ×1 IMPLANT
TUBE CONNECTING 20X1/4 (TUBING) IMPLANT
YANKAUER SUCT BULB TIP NO VENT (SUCTIONS) IMPLANT

## 2021-10-28 NOTE — Anesthesia Postprocedure Evaluation (Signed)
Anesthesia Post Note  Patient: Adrienne Harris  Procedure(s) Performed: RIGHT BREAST LUMPECTOMY WITH RADIOACTIVE SEED AND AXILLARY SENTINEL LYMPH NODE BIOPSY (Right: Breast)     Patient location during evaluation: PACU Anesthesia Type: General and Regional Level of consciousness: awake and alert, oriented and patient cooperative Pain management: pain level controlled Vital Signs Assessment: post-procedure vital signs reviewed and stable Respiratory status: spontaneous breathing, nonlabored ventilation and respiratory function stable Cardiovascular status: blood pressure returned to baseline and stable Postop Assessment: no apparent nausea or vomiting Anesthetic complications: no   No notable events documented.  Last Vitals:  Vitals:   10/28/21 1135 10/28/21 1315  BP: 117/83 (!) 101/57  Pulse: (!) 54 (!) 55  Resp: (!) 23 10  Temp:  36.4 C  SpO2: 100% 100%    Last Pain:  Vitals:   10/28/21 1315  TempSrc:   PainSc: [place]

## 2021-10-28 NOTE — Anesthesia Procedure Notes (Signed)
Anesthesia Regional Block: Pectoralis block   Pre-Anesthetic Checklist: , timeout performed,  Correct Patient, Correct Site, Correct Laterality,  Correct Procedure, Correct Position, site marked,  Risks and benefits discussed,  Surgical consent,  Pre-op evaluation,  At surgeon's request and post-op pain management  Laterality: Right  Prep: Maximum Sterile Barrier Precautions used, chloraprep       Needles:  Injection technique: Single-shot  Needle Type: Echogenic Stimulator Needle     Needle Length: 9cm  Needle Gauge: 22     Additional Needles:   Procedures:,,,, ultrasound used (permanent image in chart),,    Narrative:  Start time: 10/28/2021 11:30 AM End time: 10/28/2021 11:35 AM Injection made incrementally with aspirations every 5 mL.  Performed by: Personally  Anesthesiologist: Pervis Hocking, DO  Additional Notes: Monitors applied. No increased pain on injection. No increased resistance to injection. Injection made in 5cc increments. Good needle visualization. Patient tolerated procedure well.

## 2021-10-28 NOTE — Interval H&P Note (Signed)
History and Physical Interval Note:  10/28/2021 10:48 AM  Adrienne Harris  has presented today for surgery, with the diagnosis of RIGHT BREAST CANCER.  The various methods of treatment have been discussed with the patient and family. After consideration of risks, benefits and other options for treatment, the patient has consented to  Procedure(s): RIGHT BREAST LUMPECTOMY WITH RADIOACTIVE SEED AND AXILLARY SENTINEL LYMPH NODE BIOPSY (Right) as a surgical intervention.  The patient's history has been reviewed, patient examined, no change in status, stable for surgery.  I have reviewed the patient's chart and labs.  Questions were answered to the patient's satisfaction.     Rolm Bookbinder

## 2021-10-28 NOTE — Transfer of Care (Signed)
Immediate Anesthesia Transfer of Care Note  Patient: Adrienne Harris  Procedure(s) Performed: RIGHT BREAST LUMPECTOMY WITH RADIOACTIVE SEED AND AXILLARY SENTINEL LYMPH NODE BIOPSY (Right: Breast)  Patient Location: PACU  Anesthesia Type:GA combined with regional for post-op pain  Level of Consciousness: sedated  Airway & Oxygen Therapy: Patient Spontanous Breathing and Patient connected to face mask oxygen  Post-op Assessment: Report given to RN and Post -op Vital signs reviewed and stable  Post vital signs: Reviewed and stable  Last Vitals:  Vitals Value Taken Time  BP 101/57 10/28/21 1315  Temp    Pulse 55 10/28/21 1316  Resp 10 10/28/21 1316  SpO2 100 % 10/28/21 1316  Vitals shown include unvalidated device data.  Last Pain:  Vitals:   10/28/21 1135  TempSrc:   PainSc: 0-No pain         Complications: No notable events documented.

## 2021-10-28 NOTE — H&P (View-Only) (Signed)
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 She has no mass or dc. She has no prior breast history. She has c density breasts. She had a right breast asymmetry. On [place] there is a 7x6x7 mm mass. There is no axillary adenopathy by [place]. Biopsy is IMC grade III (pathology doesn't differentiate) and er and pr are both 51-90%, her 2 is negative. Genetics is negative. I have reviewed her note. She is here with her husband to discuss options  Review of Systems: A complete review of systems was obtained from the patient. I have reviewed this information and discussed as appropriate with the patient. See HPI as well for other ROS.  Review of Systems  All other systems reviewed and are negative.   Medical History: History reviewed. No pertinent past medical history.  Patient Active Problem List  Diagnosis  Malignant neoplasm of lower-inner quadrant of right breast of female, estrogen receptor positive (CMS-HCC)   Past Surgical History:  Procedure Laterality Date  ORAL SURGERY OPERATIVE NOTE    Allergies  Allergen Reactions  Hydrocortisone Rash  Allergic to all topical corticosteriods   Current Outpatient Medications on File Prior to Visit  Medication Sig Dispense Refill  cyanocobalamin (VITAMIN B12) 1,000 mcg/mL injection Take by mouth  propranoloL (INDERAL) 20 MG tablet    Family History  Problem Relation Age of Onset  Skin cancer Mother  High blood pressure (Hypertension) Father  Skin cancer Father    Social History   Tobacco Use  Smoking Status Never  Smokeless Tobacco Never  Marital status: Married  Tobacco Use  Smoking status: Never  Smokeless tobacco: Never  Substance and Sexual Activity  Alcohol use: Yes  Drug use: Never   Objective:   Vitals:  10/04/21 1047  BP: 118/70  Pulse: 72  Temp: 36.8 C (98.3 F)  SpO2: 98%  Weight: 60.5 kg (133 lb 6.4 oz)  Height: 167.6 cm ('5\' 6"'$ )   Body mass index is 21.53  kg/m.  Physical Exam Vitals reviewed.  Constitutional:  Appearance: Normal appearance.  Chest:  Breasts: Right: No inverted nipple, mass or nipple discharge.  Left: No inverted nipple, mass or nipple discharge.  Lymphadenopathy:  Upper Body:  Right upper body: No supraclavicular or axillary adenopathy.  Left upper body: No supraclavicular or axillary adenopathy.  Neurological:  Mental Status: She is alert.    Assessment and Plan:   Malignant neoplasm of lower-inner quadrant of right breast of female, estrogen receptor positive (CMS-HCC) - Ambulatory Referral to Physical Therapy  right breast seed guided lumpectomy, right axillary sentinel node biopsy   We discussed the staging and pathophysiology of breast cancer. We discussed all of the different options for treatment for breast cancer including surgery, chemotherapy, radiation therapy, Herceptin, and antiestrogen therapy.  We discussed a sentinel lymph node biopsy as she does not appear to having lymph node involvement right now. We discussed the performance of that with injection of tracer. We discussed that there is a chance of having a positive node with a sentinel lymph node biopsy and we will await the permanent pathology to make any other first further decisions in terms of her treatment. We discussed up to a 5% risk lifetime of chronic shoulder pain as well as lymphedema associated with a sentinel lymph node biopsy.  We discussed the options for treatment of the breast cancer which included lumpectomy versus a mastectomy. We discussed the performance of the lumpectomy with radioactive seed placement. We discussed  a 5-10% chance of a positive margin requiring reexcision in the operating room. We also discussed that she will need radiation therapy if she undergoes lumpectomy. We discussed mastectomy and the postoperative care for that as well. Mastectomy can be followed by reconstruction. The decision for lumpectomy vs  mastectomy has no impact on decision for chemotherapy. Most mastectomy patients will not need radiation therapy. We discussed that there is no difference in her survival whether she undergoes lumpectomy with radiation therapy or antiestrogen therapy versus a mastectomy. There is also no real difference between her recurrence in the breast.\  We discussed the risks of operation including bleeding, infection, possible reoperation. She understands her further therapy will be based on what her stages at the time of her operation.

## 2021-10-28 NOTE — Discharge Instructions (Addendum)
[facility] Phone Number [phone]  POST OP INSTRUCTIONS Take 400 mg of ibuprofen every 8 hours or 650 mg tylenol every 6 hours for next 72 hours then as needed. Use ice several times daily also.  A prescription for pain medication may be given to you upon discharge.  Take your pain medication as prescribed, if needed.  If narcotic pain medicine is not needed, then you may take acetaminophen (Tylenol), naprosyn (Alleve) or ibuprofen (Advil) as needed. Take your usually prescribed medications unless otherwise directed If you need a refill on your pain medication, please contact your pharmacy.  They will contact our office to request authorization.  Prescriptions will not be filled after 5pm or on week-ends. You should eat very light the first 24 hours after surgery, such as soup, crackers, pudding, etc.  Resume your normal diet the day after surgery. Most patients will experience some swelling and bruising in the breast.  Ice packs and a good support bra will help.  Wear the breast binder provided or a sports bra for 72 hours day and night.  After that wear a sports bra during the day until you return to the office. Swelling and bruising can take several days to resolve.  It is common to experience some constipation if taking pain medication after surgery.  Increasing fluid intake and taking a stool softener will usually help or prevent this problem from occurring.  A mild laxative (Milk of Magnesia or Miralax) should be taken according to package directions if there are no bowel movements after 48 hours. I used skin glue on the incision, you may shower in 24 hours.  The glue will flake off over the next 2-3 weeks.  Any sutures or staples will be removed at the office during your follow-up visit. ACTIVITIES:  You may resume regular daily activities (gradually increasing) beginning the next day.  Wearing a good support bra or sports bra minimizes pain and swelling.  You may have  sexual intercourse when it is comfortable. You may drive when you no longer are taking prescription pain medication, you can comfortably wear a seatbelt, and you can safely maneuver your car and apply brakes. RETURN TO WORK:  ______________________________________________________________________________________ Dennis Bast should see your doctor in the office for a follow-up appointment approximately two weeks after your surgery.  Your doctor's nurse will typically make your follow-up appointment when she calls you with your pathology report.  Expect your pathology report 3-4 business days after your surgery.  You may call to check if you do not hear from [place] after three days. OTHER INSTRUCTIONS: _______________________________________________________________________________________________ _____________________________________________________________________________________________________________________________________ _____________________________________________________________________________________________________________________________________ _____________________________________________________________________________________________________________________________________  WHEN TO CALL DR WAKEFIELD: Fever over 101.0 Nausea and/or vomiting. Extreme swelling or bruising. Continued bleeding from incision. Increased pain, redness, or drainage from the incision.  The clinic staff is available to answer your questions during regular business hours.  Please don't hesitate to call and ask to speak to one of the nurses for clinical concerns.  If you have a medical emergency, go to the nearest emergency room or call 911.  A surgeon from [facility] is always on call at the hospital.  For further questions, please visit [email or web address]  No tylenol or ibuprofen until after 5pm if needed today.    Post Anesthesia Home Care Instructions  Activity: Get plenty of rest for  the remainder of the day. A responsible individual must stay with you for 24 hours following the procedure.  For the next 24 hours, DO NOT: -Drive a  car -Paediatric nurse -Drink alcoholic beverages -Take any medication unless instructed by your physician -Make any legal decisions or sign important papers.  Meals: Start with liquid foods such as gelatin or soup. Progress to regular foods as tolerated. Avoid greasy, spicy, heavy foods. If nausea and/or vomiting occur, drink only clear liquids until the nausea and/or vomiting subsides. Call your physician if vomiting continues.  Special Instructions/Symptoms: Your throat may feel dry or sore from the anesthesia or the breathing tube placed in your throat during surgery. If this causes discomfort, gargle with warm salt water. The discomfort should disappear within 24 hours.  If you had a scopolamine patch placed behind your ear for the management of post- operative nausea and/or vomiting:  1. The medication in the patch is effective for 72 hours, after which it should be removed.  Wrap patch in a tissue and discard in the trash. Wash hands thoroughly with soap and water. 2. You may remove the patch earlier than 72 hours if you experience unpleasant side effects which may include dry mouth, dizziness or visual disturbances.  3. Avoid touching the patch. Wash your hands with soap and water after contact with the patch.  Information for Discharge Teaching: EXPAREL (bupivacaine liposome injectable suspension)   Your surgeon or anesthesiologist gave you EXPAREL(bupivacaine) to help control your pain after surgery.  EXPAREL is a local anesthetic that provides pain relief by numbing the tissue around the surgical site. EXPAREL is designed to release pain medication over time and can control pain for up to 72 hours. Depending on how you respond to EXPAREL, you may require less pain medication during your recovery.  Possible side effects: Temporary  loss of sensation or ability to move in the area where bupivacaine was injected. Nausea, vomiting, constipation Rarely, numbness and tingling in your mouth or lips, lightheadedness, or anxiety may occur. Call your doctor right away if you think you may be experiencing any of these sensations, or if you have other questions regarding possible side effects.  Follow all other discharge instructions given to you by your surgeon or nurse. Eat a healthy diet and drink plenty of water or other fluids.  If you return to the hospital for any reason within 96 hours following the administration of EXPAREL, it is important for health care providers to know that you have received this anesthetic. A teal colored band has been placed on your arm with the date, time and amount of EXPAREL you have received in order to alert and inform your health care providers. Please leave this armband in place for the full 96 hours following administration, and then you may remove the band.

## 2021-10-28 NOTE — Op Note (Addendum)
Preoperative diagnosis: Clinical stage I right breast cancer Postoperative diagnosis: Same as above Procedure: 1.  Right breast radioactive seed guided lumpectomy 2.  Right deep axillary sentinel lymph node biopsy 3.  Injection of mag trace for sentinel lymph node identification Surgeon: Dr. Serita Grammes Anesthesia: General with a pectoral block Estimated blood loss: Minimal Specimens: 1.  Right breast tissue containing seed marked with paint 2.  Additional medial and posterior margins marked short superior, long lateral, double deep 3.  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  She had a right breast asymmetry. On [place] there is a 7x6x7 mm mass. There is no axillary adenopathy by [place]. Biopsy is IMC grade III (pathology doesn't differentiate) and er and pr are both 51-90%, her 2 is negative. Genetics is negative. We discussed lumpectomy and sn biopsy.    Procedure: After informed consent was obtained she had a seed placed first.  I had these mammograms available in the operating room.  I preoperatively prepped the area lateral to the areola injected 2 cc of mag trace in the subareolar position.  She then underwent a pectoral block.  Antibiotics were given and SCDs were in place.  She was placed under general anesthesia without complication.  She was prepped and draped in the standard sterile surgical fashion.  Surgical timeout was then performed.   The tumor was in the central lower breast I made a periareolar incision in  order to hide the scar later.  I dissected tothe seed.  I removed the seed with an attempt a get a clear margin all around this.  I then marked this with paint.  3D CT scan was taken and I thought I might be close to a few  margins so I did remove additional medial and posterior margins.  Hemostasis was obtained in the cavity. The posterior margin is the pectoralis muscle.   I placed some clips in the cavity to mark this for radiation.  I then closed the cavity completely with 2-0 Vicryl.  The skin was closed with 3-0 Vicryl and 5-0 Monocryl.  Glue and Steri-Strips were applied.   I then identified the activity in the axilla.  I made an incision below the axillary hairline.  I dissected to the axillary fascia.  Using the probe I was able to identify what appeared to be a couple lymph nodes containing the magtrace.  I excised these.  There were no other palpable nodes and no other nodes that were containing tracer.  I passed these off the table.  I then obtained hemostasis in the axilla.  I closed this with 2-0 Vicryl.  The skin was closed with 3-0 Vicryl and 4 Monocryl.  Glue and Steri-Strips were applied.  She tolerated this well was extubated and transferred to recovery stable.

## 2021-10-28 NOTE — Anesthesia Procedure Notes (Signed)
Procedure Name: LMA Insertion Date/Time: 10/28/2021 12:10 PM  Performed by: Tawni Millers, CRNAPre-anesthesia Checklist: Patient identified, Emergency Drugs available, Suction available and Patient being monitored Patient Re-evaluated:Patient Re-evaluated prior to induction Oxygen Delivery Method: Circle system utilized Preoxygenation: Pre-oxygenation with 100% oxygen Induction Type: IV induction Ventilation: Mask ventilation without difficulty LMA: LMA inserted LMA Size: 4.0 Number of attempts: 1 Airway Equipment and Method: Bite block Placement Confirmation: positive ETCO2 Tube secured with: Tape Dental Injury: Teeth and Oropharynx as per pre-operative assessment

## 2021-10-28 NOTE — Progress Notes (Signed)
Assisted Dr. Finucane with right, pectoralis, ultrasound guided block. Side rails up, monitors on throughout procedure. See vital signs in flow sheet. Tolerated Procedure well. 

## 2021-10-28 NOTE — H&P (Addendum)
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 She has no mass or dc. She has no prior breast history. She has c density breasts. She had a right breast asymmetry. On [place] there is a 7x6x7 mm mass. There is no axillary adenopathy by [place]. Biopsy is IMC grade III (pathology doesn't differentiate) and er and pr are both 51-90%, her 2 is negative. Genetics is negative. I have reviewed her note. She is here with her husband to discuss options  Review of Systems: A complete review of systems was obtained from the patient. I have reviewed this information and discussed as appropriate with the patient. See HPI as well for other ROS.  Review of Systems  All other systems reviewed and are negative.   Medical History: History reviewed. No pertinent past medical history.  Patient Active Problem List  Diagnosis  Malignant neoplasm of lower-inner quadrant of right breast of female, estrogen receptor positive (CMS-HCC)   Past Surgical History:  Procedure Laterality Date  ORAL SURGERY OPERATIVE NOTE    Allergies  Allergen Reactions  Hydrocortisone Rash  Allergic to all topical corticosteriods   Current Outpatient Medications on File Prior to Visit  Medication Sig Dispense Refill  cyanocobalamin (VITAMIN B12) 1,000 mcg/mL injection Take by mouth  propranoloL (INDERAL) 20 MG tablet    Family History  Problem Relation Age of Onset  Skin cancer Mother  High blood pressure (Hypertension) Father  Skin cancer Father    Social History   Tobacco Use  Smoking Status Never  Smokeless Tobacco Never  Marital status: Married  Tobacco Use  Smoking status: Never  Smokeless tobacco: Never  Substance and Sexual Activity  Alcohol use: Yes  Drug use: Never   Objective:   Vitals:  10/04/21 1047  BP: 118/70  Pulse: 72  Temp: 36.8 C (98.3 F)  SpO2: 98%  Weight: 60.5 kg (133 lb 6.4 oz)  Height: 167.6 cm ('5\' 6"'$ )   Body mass index is 21.53  kg/m.  Physical Exam Vitals reviewed.  Constitutional:  Appearance: Normal appearance.  Chest:  Breasts: Right: No inverted nipple, mass or nipple discharge.  Left: No inverted nipple, mass or nipple discharge.  Lymphadenopathy:  Upper Body:  Right upper body: No supraclavicular or axillary adenopathy.  Left upper body: No supraclavicular or axillary adenopathy.  Neurological:  Mental Status: She is alert.    Assessment and Plan:   Malignant neoplasm of lower-inner quadrant of right breast of female, estrogen receptor positive (CMS-HCC) - Ambulatory Referral to Physical Therapy  right breast seed guided lumpectomy, right axillary sentinel node biopsy   We discussed the staging and pathophysiology of breast cancer. We discussed all of the different options for treatment for breast cancer including surgery, chemotherapy, radiation therapy, Herceptin, and antiestrogen therapy.  We discussed a sentinel lymph node biopsy as she does not appear to having lymph node involvement right now. We discussed the performance of that with injection of tracer. We discussed that there is a chance of having a positive node with a sentinel lymph node biopsy and we will await the permanent pathology to make any other first further decisions in terms of her treatment. We discussed up to a 5% risk lifetime of chronic shoulder pain as well as lymphedema associated with a sentinel lymph node biopsy.  We discussed the options for treatment of the breast cancer which included lumpectomy versus a mastectomy. We discussed the performance of the lumpectomy with radioactive seed placement. We discussed  a 5-10% chance of a positive margin requiring reexcision in the operating room. We also discussed that she will need radiation therapy if she undergoes lumpectomy. We discussed mastectomy and the postoperative care for that as well. Mastectomy can be followed by reconstruction. The decision for lumpectomy vs  mastectomy has no impact on decision for chemotherapy. Most mastectomy patients will not need radiation therapy. We discussed that there is no difference in her survival whether she undergoes lumpectomy with radiation therapy or antiestrogen therapy versus a mastectomy. There is also no real difference between her recurrence in the breast.\  We discussed the risks of operation including bleeding, infection, possible reoperation. She understands her further therapy will be based on what her stages at the time of her operation.

## 2021-10-29 ENCOUNTER — Encounter (HOSPITAL_BASED_OUTPATIENT_CLINIC_OR_DEPARTMENT_OTHER): Payer: Self-pay | Admitting: General Surgery

## 2021-10-30 LAB — SURGICAL PATHOLOGY

## 2021-10-31 ENCOUNTER — Telehealth: Payer: Self-pay | Admitting: *Deleted

## 2021-10-31 ENCOUNTER — Encounter: Payer: Self-pay | Admitting: *Deleted

## 2021-10-31 DIAGNOSIS — Z170 Estrogen receptor positive status [ER+]: Secondary | ICD-10-CM

## 2021-10-31 NOTE — Telephone Encounter (Signed)
Oncotype Dx submitted on surgical path [id number], Order ID  [id number]

## 2021-10-31 NOTE — Progress Notes (Signed)
Spoke with Jaleea to see how she is doing since surgery, she is doing well, just some soreness.   Oncotype Dx sent on surgical pathology (see phone note).   She would also like to be referred to Dr. Isidore Moos for radiation.

## 2021-11-04 NOTE — Progress Notes (Signed)
Radiation Oncology         [phone] ________________________________  Initial Outpatient Consultation  Name: Adrienne Harris MRN: [id number]  Date: 11/05/2021  DOB: 09/21/1975  GB:TDVVOH, Yvetta Coder, FNP  [name], MD   REFERRING PHYSICIAN: [name], MD  DIAGNOSIS:    ICD-10-CM   1. Malignant neoplasm of lower-inner quadrant of right breast of female, estrogen receptor positive ([place])  C50.311 Pregnancy, urine   Z17.0      S/p lumpectomy and SLN biopsies: Right Breast LIQ, Invasive ductal carcinoma and intermediate to high-grade DCIS, ER+ / PR+ / Her2- by IHC, Grade 2   Cancer Staging  Malignant neoplasm of lower-inner quadrant of right breast of female, estrogen receptor positive ([place]) Staging form: Breast, AJCC 8th Edition - Clinical stage from 10/01/2021: Stage IA (cT1b, cN0, cM0, G3, ER+, PR+, HER2-) - Signed by [name], MD on 10/01/2021  CHIEF COMPLAINT: Here to discuss management of right breast cancer  HISTORY OF PRESENT ILLNESS::Adrienne Harris is a 46 y.o. female who presented with a right breast abnormality on the following imaging: bilateral screening mammogram on the date of 08/30/21.  No symptoms, if any, were reported at that time. Diagnostic right breast mammogram and right breast ultrasound on 09/18/21 revealed a mass in the 6 o'clock right breast, 3 cmfn, concerning for malignancy, measuring 7 x 6 by 7 mm. No suspicious right axillary adenopathy was appreciated.   Biopsy of the right breast mass on date of 09/25/21 showed grade 3 invasive ductal carcinoma measuring 6 mm in the greatest linear extent of the sample with DCIS.  ER status: 51-90% positive with strong staining intensity; PR status 51-90% positive with moderate staining intensity; Her2 status negative by IHC; Grade 3. No lymph nodes were examined  Accordingly, the patient was referred to Dr. Janese Banks, [facility], on 10/01/21 who recommended proceeding with breast conserving surgery.  Dr. Janese Banks also recommended Oncotype testing on the final surgical sample given her high grade tumor and tumor size. Given her ER/PR positive tumor and pre-menopausal status, she was also recommended hormone therapy for 10 years following adjuvant treatment (if indicated).    [facility] genetic testing performed on 10/01/21 showed no pathogenic variants. However, a variant of uncertain significance was detected ([id number]) in the APC gene. Of note: patient has a family history significant for breast cancer in her paternal aunt and maternal great aunt. Her father also has a history of melanoma.   The patient was referred to general surgery and opted to proceed with right breast lumpectomy with SLN biopsies on 10/28/21 under the care of Dr. Donne Hazel. Pathology from the procedure revealed: tumor the size of 0.7 x 0.6 x 0.6 cm; histology of grade 2 invasive ductal carcinoma and intermediate to high grade DCIS with focal necrosis; all margins negative for both invasive and in-situ disease; margin status to invasive disease of 3 mm from the final posterior margin; margin status to in-situ disease of 2 mm from the final posterior margin; nodal status of 5/5 right axillary sentinel lymph node excisions negative for carcinoma. ER status: 51-90% positive with strong staining intensity; PR status 51-90% positive with moderate staining intensity; Her2 status negative by IHC; Grade 2.   Oncotype testing has been ordered and is pending at this time.   She is in her usual state of health.  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  She is here with her husband today who appears to  be very supportive.  She denies any fevers or chills.  PREVIOUS RADIATION THERAPY: No  PAST MEDICAL HISTORY:  has a past medical history of Atopic dermatitis, Basal cell carcinoma, Dysrhythmia, Hemochromatosis, Palpitations, PVC (premature ventricular contraction), Shoulder pain, left (01/24/2015), and Shoulder  pain, right (01/24/2015).    PAST SURGICAL HISTORY: Past Surgical History:  Procedure Laterality Date   BREAST BIOPSY Right 09/25/2021   BREAST LUMPECTOMY WITH RADIOACTIVE SEED AND SENTINEL LYMPH NODE BIOPSY Right 10/28/2021   Procedure: RIGHT BREAST LUMPECTOMY WITH RADIOACTIVE SEED AND AXILLARY SENTINEL LYMPH NODE BIOPSY;  Surgeon: Rolm Bookbinder, MD;  Location: [facility];  Service: General;  Laterality: Right;   COLONOSCOPY WITH PROPOFOL N/A 01/24/2021   Procedure: COLONOSCOPY WITH PROPOFOL;  Surgeon: [name], MD;  Location: [facility];  Service: Endoscopy;  Laterality: N/A;   Excision of basal cell carcinoma     WISDOM TOOTH EXTRACTION      FAMILY HISTORY: family history includes Alzheimer's disease in her paternal aunt and paternal grandmother; Basal cell carcinoma in her mother; Breast cancer in some other family members; Breast cancer (age of onset: 61) in her paternal aunt; Colon polyps (age of onset: 35) in her father; Diabetes in her maternal grandfather; Hemochromatosis in her mother; Hyperlipidemia in her maternal grandfather and maternal grandmother; Hypertension in her father, maternal grandfather, and maternal grandmother; Kidney cancer in her paternal aunt; Melanoma in her father; Osteoporosis in her maternal grandmother; Stroke in her maternal grandfather.  SOCIAL HISTORY:  reports that she has never smoked. She has never used smokeless tobacco. She reports current alcohol use of about 2.0 standard drinks of alcohol per week. She reports that she does not use drugs.  ALLERGIES: Propylene glycol, Hydrocortisone, and Other  MEDICATIONS:  Current Outpatient Medications  Medication Sig Dispense Refill   acetaminophen (TYLENOL) 500 MG tablet Take 1,000 mg by mouth every 8 (eight) hours as needed for moderate pain.     cholecalciferol (VITAMIN D) 1000 units tablet Take 1,000 Units by mouth daily.     Cyanocobalamin (VITAMIN B 12 PO) Take 1,000 mcg by  mouth daily.     ibuprofen (ADVIL,MOTRIN) 200 MG tablet Take 400 mg by mouth every 8 (eight) hours as needed for moderate pain.     propranolol (INDERAL) 20 MG tablet Take 1 tablet (20 mg total) by mouth daily. 90 tablet 3   tacrolimus (PROTOPIC) 0.1 % ointment Apply to affected areas twice daily as needed 60 g 2   levonorgestrel-ethinyl estradiol (AVIANE) 0.1-20 MG-MCG tablet Take 1 tablet by mouth daily. (Patient not taking: Reported on 10/08/2021) 84 tablet 3   traMADol (ULTRAM) 50 MG tablet Take 1 tablet (50 mg total) by mouth every 6 (six) hours as needed. (Patient not taking: Reported on 11/05/2021) 10 tablet 0   No current facility-administered medications for this encounter.    REVIEW OF SYSTEMS: As above in HPI.   PHYSICAL EXAM:  weight is 133 lb 2 oz (60.4 kg). Her oral temperature is 98.5 F (36.9 C). Her blood pressure is 137/84 and her pulse is 88. Her oxygen saturation is 100%.   General: Alert and oriented, in no acute distress HEENT: Head is normocephalic. Extraocular movements are intact.   Heart: Regular in rate and rhythm with no murmurs, rubs, or gallops. Chest: Clear to auscultation bilaterally, with no rhonchi, wheezes, or rales. Abdomen: Soft, nontender, nondistended, with no rigidity or guarding. Extremities: No cyanosis or edema. Skin: No concerning lesions. Musculoskeletal: symmetric strength and muscle tone throughout. Neurologic:  Cranial nerves II through XII are grossly intact. No obvious focalities. Speech is fluent. Coordination is intact. Psychiatric: Judgment and insight are intact. Affect is appropriate. Breasts: Postsurgical bruising throughout right breast; swelling and redness at right axillary scar.  No excessive warmth to the breast or axilla.  At this time there does not appear to be any obvious infection.    ECOG = 0  0 - Asymptomatic (Fully active, able to carry on all predisease activities without restriction)  1 - Symptomatic but completely  ambulatory (Restricted in physically strenuous activity but ambulatory and able to carry out work of a light or sedentary nature. For example, light housework, office work)  2 - Symptomatic, <50% in bed during the day (Ambulatory and capable of all self care but unable to carry out any work activities. Up and about more than 50% of waking hours)  3 - Symptomatic, >50% in bed, but not bedbound (Capable of only limited self-care, confined to bed or chair 50% or more of waking hours)  4 - Bedbound (Completely disabled. Cannot carry on any self-care. Totally confined to bed or chair)  5 - Death   Eustace Pen MM, Creech RH, Tormey DC, et al. [phone]). "Toxicity and response criteria of the [facility]". [place] Oncol. 5 (6): 649-55   LABORATORY DATA:  Lab Results  Component Value Date   WBC 6.1 10/09/2021   HGB 14.7 10/09/2021   HCT 43.3 10/09/2021   MCV 99.1 10/09/2021   PLT 268 10/09/2021   CMP     Component Value Date/Time   NA 142 10/09/2021 0937   K 3.9 10/09/2021 0937   CL 107 10/09/2021 0937   CO2 29 10/09/2021 0937   GLUCOSE 98 10/09/2021 0937   BUN 13 10/09/2021 0937   CREATININE 0.76 10/09/2021 0937   CREATININE 0.68 01/24/2014 1435   CALCIUM 9.1 10/09/2021 0937   PROT 6.5 09/09/2021 0836   PROT 6.9 01/24/2014 1435   ALBUMIN 4.0 09/09/2021 0836   ALBUMIN 3.6 01/24/2014 1435   AST 16 09/09/2021 0836   AST 13 (L) 01/24/2014 1435   ALT 13 09/09/2021 0836   ALT 19 01/24/2014 1435   ALKPHOS 52 09/09/2021 0836   ALKPHOS 60 01/24/2014 1435   BILITOT 0.5 09/09/2021 0836   BILITOT 0.2 01/24/2014 1435   GFRNONAA >60 10/09/2021 0937   GFRNONAA >60 01/24/2014 1435   GFRNONAA >60 02/22/2013 1018   GFRAA >60 01/21/2018 0758   GFRAA >60 01/24/2014 1435   GFRAA >60 02/22/2013 1018         RADIOGRAPHY: MM Breast Surgical Specimen  Result Date: 10/28/2021 CLINICAL DATA:  46 year old with biopsy-proven grade 3 invasive ductal carcinoma and DCIS in the  lower RIGHT breast. Radioactive seed localization was performed 10/23/2021 in anticipation of today's lumpectomy. EXAM: SPECIMEN RADIOGRAPH OF THE RIGHT BREAST COMPARISON:  Previous exam(s). FINDINGS: Status post excision of the RIGHT breast. The radioactive seed and the ribbon shaped tissue marking clip are present in the non-compressed specimen. The seed is intact. This was discussed with the operating room nurse by telephone at the time of interpretation on 10/28/2021 at 12:33 p.m. IMPRESSION: Specimen radiograph of the RIGHT breast. Electronically Signed   By: Evangeline Dakin M.D.   On: 10/28/2021 12:36  MM CLIP PLACEMENT RIGHT  Result Date: 10/25/2021 CLINICAL DATA:  Status post ultrasound-guided radioactive seed localization prior to surgery. EXAM: 3D DIAGNOSTIC RIGHT MAMMOGRAM POST ULTRASOUND BIOPSY COMPARISON:  Previous exam(s). FINDINGS: 3D Mammographic images were obtained following  ultrasound guided radioactive seed placement prior to surgery. IMPRESSION: Appropriate positioning of the radioactive seed adjacent to the ribbon shaped clip. Final Assessment: Post Procedure Mammograms for Marker Placement Electronically Signed   By: Lillia Mountain M.D.   On: 10/25/2021 07:48  [place] RT RADIOACTIVE SEED LOC  Result Date: 10/23/2021 CLINICAL DATA:  Biopsy proven invasive mammary carcinoma in the right breast. Radioactive seed localization prior to surgery. EXAM: ULTRASOUND GUIDED RADIOACTIVE SEED LOCALIZATION OF THE RIGHT BREAST COMPARISON:  Previous exam(s). FINDINGS: Patient presents for radioactive seed localization prior to surgery. I met with the patient and we discussed the procedure of seed localization including benefits and alternatives. We discussed the high likelihood of a successful procedure. We discussed the risks of the procedure including infection, bleeding, tissue injury and further surgery. We discussed the low dose of radioactivity involved in the procedure. Informed, written consent was  given. The usual time-out protocol was performed immediately prior to the procedure. Using ultrasound guidance, sterile technique, 1% lidocaine and an I-125 radioactive seed, the mass in the 6 o'clock region of the right breast was localized using a lateral to medial approach. The follow-up mammogram images confirm the seed in the expected location and were marked for Dr. Donne Hazel. Follow-up survey of the patient confirms presence of the radioactive seed. Order number of I-125 seed:  [id number]. Total activity:  2.694 millicuries reference Date: 09/11/2021 The patient tolerated the procedure well and was released from the [facility]. She was given instructions regarding seed removal. IMPRESSION: Radioactive seed localization right breast. No apparent complications. Electronically Signed   By: Lillia Mountain M.D.   On: 10/23/2021 15:01     IMPRESSION/PLAN: This is a delightful 46 year old woman with stage I ER/PR positive HER2 negative right breast cancer.  Oncotype results are pending.  I anticipate that medical oncology will refer the patient to me when she is ready for radiation planning.  The patient also has my contact information to let me know once she has her Oncotype results.  It was a pleasure meeting the patient today. We discussed the risks, benefits, and side effects of radiotherapy. I recommend radiotherapy to the right breast to reduce her risk of locoregional recurrence by 2/3.  We discussed that radiation would take approximately 4 weeks to complete and that I would give the patient a few weeks to heal following surgery before starting treatment planning.  If chemotherapy were to be given, this would precede radiotherapy. We spoke about acute effects including skin irritation and fatigue as well as much less common late effects including internal organ injury or irritation.  We discussed the risk of rib fracture, swelling of the breast, lymphedema, shrinkage of the breast, injury to lung, rare  secondary malignancy. We spoke about the latest technology that is used to minimize the risk of late effects for patients undergoing radiotherapy to the breast or chest wall.  We spoke about the importance of not becoming pregnant during oncologic therapy and she knows to contact me if she is concerned about pregnancy. No guarantees of treatment were given. The patient is enthusiastic about proceeding with treatment.  Consent form was signed today.  I look forward to participating in the patient's care at the appropriate time.    We talked about nutritional guidelines for patients with breast cancer.  We also talked about vaccinations and she knows it is fine for her to get routine vaccinations as recommended by her PCP -I recommended that she receive those in her contralateral arm to  avoid swollen lymph nodes in the surgical site.  Urine pregnancy test has been ordered for her next visit in radiation oncology.  She sees her surgeon later this week to inspect how she is healing postoperatively.  On date of service, in total, I spent 55 minutes on this encounter. Patient was seen in person.   __________________________________________   Eppie Gibson, MD  This document serves as a record of services personally performed by Eppie Gibson, MD. It was created on her behalf by Roney Mans, a trained medical scribe. The creation of this record is based on the scribe's personal observations and the provider's statements to them. This document has been checked and approved by the attending provider.

## 2021-11-05 ENCOUNTER — Other Ambulatory Visit: Payer: Self-pay

## 2021-11-05 ENCOUNTER — Ambulatory Visit
Admission: RE | Admit: 2021-11-05 | Discharge: 2021-11-05 | Disposition: A | Discharge status: Home / Self Care | Payer: 59 | Source: Ambulatory Visit | Attending: Radiation Oncology | Admitting: Radiation Oncology

## 2021-11-05 ENCOUNTER — Encounter: Payer: Self-pay | Admitting: Radiation Oncology

## 2021-11-05 VITALS — BP 137/84 | HR 88 | Temp 98.5°F | Wt 133.1 lb

## 2021-11-05 VITALS — BP 137/84 | HR 88 | Temp 98.5°F | Wt 133.2 lb

## 2021-11-05 DIAGNOSIS — I493 Ventricular premature depolarization: Secondary | ICD-10-CM | POA: Insufficient documentation

## 2021-11-05 DIAGNOSIS — C50311 Malignant neoplasm of lower-inner quadrant of right female breast: Secondary | ICD-10-CM | POA: Diagnosis not present

## 2021-11-05 DIAGNOSIS — Z79899 Other long term (current) drug therapy: Secondary | ICD-10-CM | POA: Insufficient documentation

## 2021-11-05 DIAGNOSIS — Z85820 Personal history of malignant melanoma of skin: Secondary | ICD-10-CM | POA: Insufficient documentation

## 2021-11-05 DIAGNOSIS — Z170 Estrogen receptor positive status [ER+]: Secondary | ICD-10-CM | POA: Diagnosis not present

## 2021-11-05 DIAGNOSIS — Z85828 Personal history of other malignant neoplasm of skin: Secondary | ICD-10-CM | POA: Insufficient documentation

## 2021-11-05 DIAGNOSIS — Z803 Family history of malignant neoplasm of breast: Secondary | ICD-10-CM | POA: Diagnosis not present

## 2021-11-05 NOTE — Progress Notes (Addendum)
Location of Breast Cancer:  Malignant neoplasm of lower-inner quadrant of right breast of female, estrogen receptor positive  Histology per Pathology Report:  10/28/2021 A. RIGHT BREAST, LUMPECTOMY:  - Invasive moderately differentiated adenocarcinoma, grade 2 (3+3+1)  - Tumor measures 0.7 x 0.6 x 0.6 cm (pT1b)  - Ductal carcinoma in situ, intermediate to high nuclear grade, solid type with focal necrosis  - Margins free of carcinoma  - DCIS 0.35 mm from posterior margin  - Invasive carcinoma 1 mm from posterior margin  - Changes consistent with prior biopsy  B. RIGHT AXILLARY SENTINEL LYMPH NODE, EXCISION:  - One benign lymph node, negative for carcinoma (0/1)  C. RIGHT AXILLARY SENTINEL LYMPH NODE, EXCISION:  - One benign lymph node, negative for carcinoma (0/1)  D. RIGHT AXILLARY SENTINEL LYMPH NODE, EXCISION:  - One benign lymph node, negative for carcinoma (0/1)  E. RIGHT AXILLARY SENTINEL LYMPH NODE, EXCISION:  - One benign lymph node, negative for carcinoma (0/1)  F. RIGHT AXILLARY SENTINEL LYMPH NODE, EXCISION:  - One benign lymph node, negative for carcinoma (0/1)  G. RIGHT BREAST, ADDITIONAL MEDIAL MARGJIN, EXCISION:  - Benign breast with fibrocystic changes including stromal fibrosis, cystic dilatation of ducts, adenosis and usual duct hyperplasia Negative for carcinoma  H. RIGHT BREAST, ADDITIONAL POSTERIOR MARGJIN, EXCISION:  - Focal ductal carcinoma in situ, intermediate nuclear grade, solid type  without necrosis  - Negative for invasive carcinoma  - True margin free of carcinoma  - DCIS 2 mm from true margin  - Benign breast with fibrocystic changes including stromal fibrosis, adenosis and cystic papillary apocrine metaplasia   Receptor Status: ER(51-90%), PR (51-90%), Her2-neu (Negative via IHC), Ki-67(Not performed)  Did patient present with symptoms (if so, please note symptoms) or was this found on screening mammography?: (from Dr. Elroy Channel last office note):  "Patient underwent lateral screening mammogram which showed a possible area of concern in the right breast.  This was followed by diagnostic mammogram and ultrasound which showed a 7 mm mass in the right lower breast.  No suspicious right axillary adenopathy"  Past/Anticipated interventions by surgeon, if any:  10/28/2021 --Dr. Rolm Bookbinder Left breast radioactive seed guided lumpectomy Left deep axillary sentinel lymph node biopsy Injection of mag trace for sentinel lymph node identification  Past/Anticipated interventions by medical oncology, if any:  Under care of Dr. Randa Evens 10/01/2021 At this time I would recommend upfront lumpectomy with a sentinel lymph node biopsy.   Given that she has a grade 3 tumor more than 5 mm she would require Oncotype testing on either her biopsy specimen on her final pathology specimen based on the final size.   I will discuss with her in greater detail after her Oncotype testing back There would also be role for adjuvant radiation therapy after lumpectomy depending on whether she needs chemotherapy or not.  Also given the fact that she has ER/PR positive tumor there would be role for hormone therapy.  Given that she is premenopausal she would likely need to take that for 10 years.  Treatment will be given with a curative intent. In terms of future birth control I would recommend copper IUD which is a nonhormonal measure over any hormonal methods of contraception. Patient will be seeing Dr. Donne Hazel to discuss lumpectomy in more detail and I will see her back after final pathology and Oncotype testing results are back We are also sending blood work out for genetic testing today and subsequently patient will be seen by genetic counseling  Lymphedema issues, if any:  Some    Pain issues, if any:  4 Breast   SAFETY ISSUES: Prior radiation? No Pacemaker/ICD? No Possible current pregnancy? No--LMP: 10/02/2021 Is the patient on methotrexate?  No  Current Complaints / other details:        Vitals:   11/05/21 1237  BP: 137/84  Pulse: 88  Temp: 98.5 F (36.9 C)  TempSrc: Oral  SpO2: 100%  Weight: 60.4 kg

## 2021-11-06 ENCOUNTER — Telehealth: Payer: Self-pay | Admitting: *Deleted

## 2021-11-06 NOTE — Telephone Encounter (Signed)
CALLED PATIENT TO INFORM OF LAB ON 11-18-21 @ 9 AM @ [facility], SPOKE WITH PATIENT AND SHE IS AWARE OF THIS LAB

## 2021-11-07 ENCOUNTER — Ambulatory Visit: Payer: Self-pay | Admitting: Physical Therapy

## 2021-11-11 DIAGNOSIS — C50311 Malignant neoplasm of lower-inner quadrant of right female breast: Secondary | ICD-10-CM | POA: Diagnosis not present

## 2021-11-11 DIAGNOSIS — Z170 Estrogen receptor positive status [ER+]: Secondary | ICD-10-CM | POA: Diagnosis not present

## 2021-11-12 ENCOUNTER — Encounter: Payer: Self-pay | Admitting: Oncology

## 2021-11-12 ENCOUNTER — Encounter (HOSPITAL_COMMUNITY): Payer: Self-pay

## 2021-11-13 ENCOUNTER — Other Ambulatory Visit: Payer: Self-pay | Admitting: General Surgery

## 2021-11-13 ENCOUNTER — Encounter: Payer: Self-pay | Admitting: Oncology

## 2021-11-13 ENCOUNTER — Inpatient Hospital Stay: Payer: 59 | Attending: Oncology | Admitting: Oncology

## 2021-11-13 VITALS — BP 136/91 | HR 81 | Temp 97.9°F | Resp 16 | Wt 135.4 lb

## 2021-11-13 DIAGNOSIS — Z7189 Other specified counseling: Secondary | ICD-10-CM | POA: Diagnosis not present

## 2021-11-13 DIAGNOSIS — Z170 Estrogen receptor positive status [ER+]: Secondary | ICD-10-CM | POA: Insufficient documentation

## 2021-11-13 DIAGNOSIS — C50311 Malignant neoplasm of lower-inner quadrant of right female breast: Secondary | ICD-10-CM | POA: Insufficient documentation

## 2021-11-13 DIAGNOSIS — Z5111 Encounter for antineoplastic chemotherapy: Secondary | ICD-10-CM | POA: Diagnosis not present

## 2021-11-13 NOTE — Progress Notes (Signed)
Hematology/Oncology Consult note [facility]  Telephone:([phone] [phone]  Patient Care Team: [name], FNP as PCP - General (Family Medicine) Daiva Huge, RN as Oncology Nurse Navigator   Name of the patient: Adrienne Harris  [id number]  1975/06/22   Date of visit: 11/13/21  Diagnosis-pathological prognostic stage Ia invasive mammary carcinoma of the right breast ER/PR positive HER2 negative  Chief complaint/ Reason for visit-discuss final pathology results and further management  Heme/Onc history: patient is a 46 year old female who recently underwent aLateral screening mammogram which showed a possible area of concern in the right breast.  This was followed by diagnostic mammogram and ultrasound which showed a 7 mm mass in the right lower breast.  No suspicious right axillary adenopathy.  Biopsy confirmed invasive mammary carcinoma 6 mm grade 3 ER 51 to 90% positive PR 51 to 90% positive and HER2 negative.    Interval history-patient is doing well post lumpectomy and denies any specific complaints at this time  ECOG PS- 0 Pain scale- 0   Review of systems- Review of Systems  Constitutional:  Negative for chills, fever, malaise/fatigue and weight loss.  HENT:  Negative for congestion, ear discharge and nosebleeds.   Eyes:  Negative for blurred vision.  Respiratory:  Negative for cough, hemoptysis, sputum production, shortness of breath and wheezing.   Cardiovascular:  Negative for chest pain, palpitations, orthopnea and claudication.  Gastrointestinal:  Negative for abdominal pain, blood in stool, constipation, diarrhea, heartburn, melena, nausea and vomiting.  Genitourinary:  Negative for dysuria, flank pain, frequency, hematuria and urgency.  Musculoskeletal:  Negative for back pain, joint pain and myalgias.  Skin:  Negative for rash.  Neurological:  Negative for dizziness, tingling, focal weakness, seizures, weakness and  headaches.  Endo/Heme/Allergies:  Does not bruise/bleed easily.  Psychiatric/Behavioral:  Negative for depression and suicidal ideas. The patient does not have insomnia.       Allergies  Allergen Reactions   Propylene Glycol     Skin rash   Hydrocortisone Rash    Allergic to all topical steriods Allergic to all topical corticosteriods   Other Rash    Topical steroid      Past Medical History:  Diagnosis Date   Atopic dermatitis    Basal cell carcinoma    15 years ago.   Dysrhythmia    Hemochromatosis    Heterozygous for two genes   Palpitations    a. 04/2013 48h Holter: sinus rhythm/sinus arrhythmia, rare PAC's/PVC's.   PVC (premature ventricular contraction)    Shoulder pain, left 01/24/2015   Shoulder pain, right 01/24/2015     Past Surgical History:  Procedure Laterality Date   BREAST BIOPSY Right 09/25/2021   BREAST LUMPECTOMY WITH RADIOACTIVE SEED AND SENTINEL LYMPH NODE BIOPSY Right 10/28/2021   Procedure: RIGHT BREAST LUMPECTOMY WITH RADIOACTIVE SEED AND AXILLARY SENTINEL LYMPH NODE BIOPSY;  Surgeon: Rolm Bookbinder, MD;  Location: [place];  Service: General;  Laterality: Right;   COLONOSCOPY WITH PROPOFOL N/A 01/24/2021   Procedure: COLONOSCOPY WITH PROPOFOL;  Surgeon: [name], MD;  Location: [facility];  Service: Endoscopy;  Laterality: N/A;   Excision of basal cell carcinoma     WISDOM TOOTH EXTRACTION      Social History   Socioeconomic History   Marital status: Married    Spouse name: Not on file   Number of children: 1   Years of education: Not on file   Highest education level: Not on file  Occupational History   Occupation: Dietitian  Tobacco Use   Smoking status: Never   Smokeless tobacco: Never  Vaping Use   Vaping Use: Never used  Substance and Sexual Activity   Alcohol use: Yes    Alcohol/week: 2.0 standard drinks of alcohol    Types: 2 Cans of beer per week    Comment: 2-4 beer/wine aweek.   Drug use: No    Sexual activity: Yes    Partners: Male  Other Topics Concern   Not on file  Social History Narrative   Married      Daughter -44 yrs.       Dietitian for [employer]   Social Determinants of Health   Financial Resource Strain: Not on file  Food Insecurity: Not on file  Transportation Needs: Not on file  Physical Activity: Inactive (05/15/2017)   Exercise Vital Sign    Days of Exercise per Week: 0 days    Minutes of Exercise per Session: 0 min  Stress: No Stress Concern Present (05/15/2017)   [place]    Feeling of Stress : Only a little  Social Connections: Not on file  Intimate Partner Violence: Not on file    Family History  Problem Relation Age of Onset   Hemochromatosis Mother    Basal cell carcinoma Mother    Hypertension Father    Melanoma Father        multiple removed   Colon polyps Father 45       adenomatous   Breast cancer Paternal Aunt 79   Kidney cancer Paternal Aunt    Alzheimer's disease Paternal Aunt    Hyperlipidemia Maternal Grandmother    Hypertension Maternal Grandmother    Osteoporosis Maternal Grandmother    Hyperlipidemia Maternal Grandfather    Hypertension Maternal Grandfather    Diabetes Maternal Grandfather    Stroke Maternal Grandfather    Alzheimer's disease Paternal Grandmother    Breast cancer Other        late years   Breast cancer Other        late years   Colon cancer Neg Hx      Current Outpatient Medications:    acetaminophen (TYLENOL) 500 MG tablet, Take 1,000 mg by mouth every 8 (eight) hours as needed for moderate pain., Disp: , Rfl:    cholecalciferol (VITAMIN D) 1000 units tablet, Take 1,000 Units by mouth daily., Disp: , Rfl:    Cyanocobalamin (VITAMIN B 12 PO), Take 1,000 mcg by mouth daily., Disp: , Rfl:    ibuprofen (ADVIL,MOTRIN) 200 MG tablet, Take 400 mg by mouth every 8 (eight) hours as needed for moderate pain., Disp: , Rfl:    propranolol (INDERAL)  20 MG tablet, Take 1 tablet (20 mg total) by mouth daily., Disp: 90 tablet, Rfl: 3   levonorgestrel-ethinyl estradiol (AVIANE) 0.1-20 MG-MCG tablet, Take 1 tablet by mouth daily. (Patient not taking: Reported on 10/08/2021), Disp: 84 tablet, Rfl: 3   tacrolimus (PROTOPIC) 0.1 % ointment, Apply to affected areas twice daily as needed (Patient not taking: Reported on 11/13/2021), Disp: 60 g, Rfl: 2   traMADol (ULTRAM) 50 MG tablet, Take 1 tablet (50 mg total) by mouth every 6 (six) hours as needed. (Patient not taking: Reported on 11/05/2021), Disp: 10 tablet, Rfl: 0  Physical exam:  Vitals:   11/13/21 1427  BP: (!) 136/91  Pulse: 81  Resp: 16  Temp: 97.9 F (36.6 C)  SpO2: 100%  Weight: 135 lb 6.4  oz (61.4 kg)   Physical Exam Constitutional:      General: She is not in acute distress. Cardiovascular:     Rate and Rhythm: Normal rate and regular rhythm.     Heart sounds: Normal heart sounds.  Pulmonary:     Effort: Pulmonary effort is normal.  Skin:    General: Skin is warm and dry.  Neurological:     Mental Status: She is alert and oriented to person, place, and time.         Latest Ref Rng & Units 10/09/2021    9:37 AM  CMP  Glucose 70 - 99 mg/dL 98   BUN 6 - 20 mg/dL 13   Creatinine 0.44 - 1.00 mg/dL 0.76   Sodium 135 - 145 mmol/L 142   Potassium 3.5 - 5.1 mmol/L 3.9   Chloride 98 - 111 mmol/L 107   CO2 22 - 32 mmol/L 29   Calcium 8.9 - 10.3 mg/dL 9.1       Latest Ref Rng & Units 10/09/2021    9:37 AM  CBC  WBC 4.0 - 10.5 K/uL 6.1   Hemoglobin 12.0 - 15.0 g/dL 14.7   Hematocrit 36.0 - 46.0 % 43.3   Platelets 150 - 400 K/uL 268     No images are attached to the encounter.  MM Breast Surgical Specimen  Result Date: 10/28/2021 CLINICAL DATA:  46 year old with biopsy-proven grade 3 invasive ductal carcinoma and DCIS in the lower RIGHT breast. Radioactive seed localization was performed 10/23/2021 in anticipation of today's lumpectomy. EXAM: SPECIMEN RADIOGRAPH OF THE  RIGHT BREAST COMPARISON:  Previous exam(s). FINDINGS: Status post excision of the RIGHT breast. The radioactive seed and the ribbon shaped tissue marking clip are present in the non-compressed specimen. The seed is intact. This was discussed with the operating room nurse by telephone at the time of interpretation on 10/28/2021 at 12:33 p.m. IMPRESSION: Specimen radiograph of the RIGHT breast. Electronically Signed   By: Evangeline Dakin M.D.   On: 10/28/2021 12:36  MM CLIP PLACEMENT RIGHT  Result Date: 10/25/2021 CLINICAL DATA:  Status post ultrasound-guided radioactive seed localization prior to surgery. EXAM: 3D DIAGNOSTIC RIGHT MAMMOGRAM POST ULTRASOUND BIOPSY COMPARISON:  Previous exam(s). FINDINGS: 3D Mammographic images were obtained following ultrasound guided radioactive seed placement prior to surgery. IMPRESSION: Appropriate positioning of the radioactive seed adjacent to the ribbon shaped clip. Final Assessment: Post Procedure Mammograms for Marker Placement Electronically Signed   By: Lillia Mountain M.D.   On: 10/25/2021 07:48  [place] RT RADIOACTIVE SEED LOC  Result Date: 10/23/2021 CLINICAL DATA:  Biopsy proven invasive mammary carcinoma in the right breast. Radioactive seed localization prior to surgery. EXAM: ULTRASOUND GUIDED RADIOACTIVE SEED LOCALIZATION OF THE RIGHT BREAST COMPARISON:  Previous exam(s). FINDINGS: Patient presents for radioactive seed localization prior to surgery. I met with the patient and we discussed the procedure of seed localization including benefits and alternatives. We discussed the high likelihood of a successful procedure. We discussed the risks of the procedure including infection, bleeding, tissue injury and further surgery. We discussed the low dose of radioactivity involved in the procedure. Informed, written consent was given. The usual time-out protocol was performed immediately prior to the procedure. Using ultrasound guidance, sterile technique, 1% lidocaine and  an I-125 radioactive seed, the mass in the 6 o'clock region of the right breast was localized using a lateral to medial approach. The follow-up mammogram images confirm the seed in the expected location and were marked for Dr. Donne Hazel. Follow-up survey  of the patient confirms presence of the radioactive seed. Order number of I-125 seed:  [id number]. Total activity:  2.426 millicuries reference Date: 09/11/2021 The patient tolerated the procedure well and was released from the [facility]. She was given instructions regarding seed removal. IMPRESSION: Radioactive seed localization right breast. No apparent complications. Electronically Signed   By: Lillia Mountain M.D.   On: 10/23/2021 15:01    Assessment and plan- Patient is a 46 y.o. female with pathological prognostic stage Ia invasive mammary carcinoma of the right breast's pT1b N0 M0 ER 51 to 90% positive PR 51 to 90% positive and HER2 negative.  She is here to discuss final pathology results and further management  On her initial biopsy patient was reported to have a grade 3 malignancy and on final pathology it was a grade 2.  Tumor was 51 to 90% ER and PR positive on biopsy.  However an Oncotype testing the PR weightage was negative.  Oncotype testing shows a score of 23 and given that patient is less than 38 years of agePremenopausal this translates to a chemo benefit of about 6.5% in general for the group but looking at the graphs, risk of recurrence with endocrine therapy alone is close to 8%.  I discussed the results of Tailrx trial and the question is what chemotherapy be beneficial or can we get away with ovarian suppression plus endocrine therapy.  Part of the benefit of chemotherapy comes from ovarian suppression itself.  Chemo and OS plus AI have not been compared head-to-head.  However given her age, grade 2-3 tumor, Oncotype score of 23 I would recommend 4 cycles of adjuvant TC chemotherapy for her Taxotere would be given at 75 mg per metered  square along with Cytoxan IV every 3 weeks for 4 cycles.  Discussed risks and benefits of chemotherapy including all but not limited to nausea, vomiting, low blood counts, risk of infections and hospitalizations.  Treatment will be given with a curative intent.  I did discuss hair loss associated with Taxotere as well and opportunity to consider Dignicap was offered.  However we do not have this in [place] and she will need to get her treatments in [place].  Cost and benefits versus risks were discussed with the patient and she chooses to proceed with chemotherapy here at [facility].  I have also communicated with Dr. Donne Hazel who will arrange for port placement next week and following that patient will proceed with cycle 1 of TC chemotherapy on day 1 with Udenyca on day 3.   Cancer Staging  Malignant neoplasm of lower-inner quadrant of right breast of female, estrogen receptor positive ([place]) Staging form: Breast, AJCC 8th Edition - Clinical stage from 10/01/2021: Stage IA (cT1b, cN0, cM0, G3, ER+, PR+, HER2-) - Signed by [name], MD on 10/01/2021 Histologic grading system: 3 grade system - Pathologic stage from 11/14/2021: Stage IA (pT1b, pN0, cM0, G2, ER+, PR+, HER2-, Oncotype DX score: 23) - Signed by [name], MD on 11/14/2021 Multigene prognostic tests performed: Oncotype DX Recurrence score range: Greater than or equal to 11 Histologic grading system: 3 grade system Residual tumor (R): R0 - None     Visit Diagnosis 1. Malignant neoplasm of lower-inner quadrant of right breast of female, estrogen receptor positive ([place])   2. Goals of care, counseling/discussion      Dr. Randa Evens, MD, MPH [facility] at [facility] [phone] 11/13/2021 5:08 PM

## 2021-11-14 ENCOUNTER — Encounter: Payer: Self-pay | Admitting: Oncology

## 2021-11-14 ENCOUNTER — Encounter (HOSPITAL_BASED_OUTPATIENT_CLINIC_OR_DEPARTMENT_OTHER): Payer: Self-pay | Admitting: General Surgery

## 2021-11-14 ENCOUNTER — Other Ambulatory Visit: Payer: Self-pay

## 2021-11-14 MED ORDER — DEXAMETHASONE 4 MG PO TABS
ORAL_TABLET | ORAL | 1 refills | Status: DC
  Filled 2021-11-14: qty 30, 7d supply, fill #0

## 2021-11-14 MED ORDER — ONDANSETRON HCL 8 MG PO TABS
8.0000 mg | ORAL_TABLET | Freq: Three times a day (TID) | ORAL | 1 refills | Status: DC | PRN
  Filled 2021-11-14: qty 30, 10d supply, fill #0

## 2021-11-14 MED ORDER — PROCHLORPERAZINE MALEATE 10 MG PO TABS
10.0000 mg | ORAL_TABLET | Freq: Four times a day (QID) | ORAL | 1 refills | Status: DC | PRN
  Filled 2021-11-14: qty 30, 8d supply, fill #0

## 2021-11-14 MED ORDER — LIDOCAINE-PRILOCAINE 2.5-2.5 % EX CREA
TOPICAL_CREAM | CUTANEOUS | 3 refills | Status: DC
  Filled 2021-11-14: qty 30, 10d supply, fill #0

## 2021-11-14 NOTE — Progress Notes (Signed)
   11/14/21 1148  PAT Phone Screen  Do You Have Diabetes? No  Do You Have Hypertension? No  Have You Ever Been to the ER for Asthma? No  Have You Taken Oral Steroids in the Past 3 Months? No  Do you Take Phenteramine or any Other Diet Drugs? No  Recent  Lab Work, EKG, CXR? Yes  Where was this test performed? EKG [facility] 10/09/21  Do you have a history of heart problems? (S)  No;Cardiologist (2015 Dr Rockey Situ for evaluation, f/u 2017 for palpitations)  Have You Ever Had Tests on Your Heart? Yes  Where? 2015 holter monitor, now on propranolol for sinus arrhythmia, rare PVCs, PACs.  Any Recent Hospitalizations? No  Height '5\' 6"'$  (1.676 m)  Weight 61 kg  Pat Appointment Scheduled No  Reason for No Appointment Not Needed

## 2021-11-14 NOTE — Progress Notes (Signed)
START ON PATHWAY REGIMEN - Breast     A cycle is every 21 days:     Docetaxel      Cyclophosphamide   **Always confirm dose/schedule in your pharmacy ordering system**  Patient Characteristics: Postoperative without Neoadjuvant Therapy (Pathologic Staging), Invasive Disease, Adjuvant Therapy, HER2 Negative/Unknown/Equivocal, ER Positive, Node Negative, pT1a, pN56m or pT1b-c, pN0/N137mor pT2 or Higher, pN0, Oncotype Intermediate Risk (16-25),  Premenopausal, Chemotherapy Preferred Therapeutic Status: Postoperative without Neoadjuvant Therapy (Pathologic Staging) AJCC Grade: G2 AJCC N Category: pN0 AJCC M Category: cM0 ER Status: Positive (+) AJCC 8 Stage Grouping: IA HER2 Status: Negative (-) Oncotype Dx Recurrence Score: 23 AJCC T Category: pT1b PR Status: Positive (+) Adjuvant Therapy Status: No Adjuvant Therapy Received Yet or Changing Initial Adjuvant Regimen due to Tolerance Has this patient completed genomic testing<= Yes - Oncotype DX(R) Menopausal Status: Premenopausal Treatment Preferred: Chemotherapy Intent of Therapy: Curative Intent, Discussed with Patient

## 2021-11-15 ENCOUNTER — Other Ambulatory Visit: Payer: Self-pay

## 2021-11-17 ENCOUNTER — Encounter: Payer: Self-pay | Admitting: Oncology

## 2021-11-18 ENCOUNTER — Inpatient Hospital Stay: Payer: 59

## 2021-11-18 ENCOUNTER — Ambulatory Visit: Payer: 59 | Attending: Family

## 2021-11-19 ENCOUNTER — Encounter (HOSPITAL_BASED_OUTPATIENT_CLINIC_OR_DEPARTMENT_OTHER)
Admission: RE | Disposition: A | Discharge status: Home / Self Care | Payer: Self-pay | Source: Home / Self Care | Attending: General Surgery

## 2021-11-19 ENCOUNTER — Ambulatory Visit (HOSPITAL_BASED_OUTPATIENT_CLINIC_OR_DEPARTMENT_OTHER)
Admission: RE | Admit: 2021-11-19 | Discharge: 2021-11-19 | Disposition: A | Discharge status: Home / Self Care | Payer: 59 | Attending: General Surgery | Admitting: General Surgery

## 2021-11-19 ENCOUNTER — Ambulatory Visit: Payer: 59 | Admitting: Oncology

## 2021-11-19 ENCOUNTER — Other Ambulatory Visit: Payer: Self-pay

## 2021-11-19 ENCOUNTER — Ambulatory Visit (HOSPITAL_COMMUNITY): Payer: 59

## 2021-11-19 ENCOUNTER — Ambulatory Visit (HOSPITAL_BASED_OUTPATIENT_CLINIC_OR_DEPARTMENT_OTHER): Payer: 59 | Admitting: Anesthesiology

## 2021-11-19 ENCOUNTER — Telehealth: Payer: Self-pay

## 2021-11-19 ENCOUNTER — Encounter (HOSPITAL_COMMUNITY): Payer: Self-pay

## 2021-11-19 ENCOUNTER — Other Ambulatory Visit: Payer: 59

## 2021-11-19 ENCOUNTER — Encounter (HOSPITAL_BASED_OUTPATIENT_CLINIC_OR_DEPARTMENT_OTHER): Payer: Self-pay | Admitting: General Surgery

## 2021-11-19 DIAGNOSIS — Z452 Encounter for adjustment and management of vascular access device: Secondary | ICD-10-CM | POA: Diagnosis not present

## 2021-11-19 DIAGNOSIS — C50311 Malignant neoplasm of lower-inner quadrant of right female breast: Secondary | ICD-10-CM | POA: Diagnosis not present

## 2021-11-19 DIAGNOSIS — Z01818 Encounter for other preprocedural examination: Secondary | ICD-10-CM

## 2021-11-19 DIAGNOSIS — C50919 Malignant neoplasm of unspecified site of unspecified female breast: Secondary | ICD-10-CM | POA: Diagnosis not present

## 2021-11-19 HISTORY — PX: PORTACATH PLACEMENT: SHX2246

## 2021-11-19 LAB — POCT PREGNANCY, URINE: Preg Test, Ur: NEGATIVE

## 2021-11-19 SURGERY — INSERTION, TUNNELED CENTRAL VENOUS DEVICE, WITH PORT
Anesthesia: General | Site: Chest | Laterality: Left

## 2021-11-19 MED ORDER — LACTATED RINGERS IV SOLN: INTRAVENOUS | Status: DC

## 2021-11-19 MED ORDER — BUPIVACAINE HCL (PF) 0.25 % IJ SOLN
INTRAMUSCULAR | Status: DC | PRN
  Administered 2021-11-19: 4 mL

## 2021-11-19 MED ORDER — PHENYLEPHRINE HCL (PRESSORS) 10 MG/ML IV SOLN
INTRAVENOUS | Status: DC | PRN
  Administered 2021-11-19 (×3): 80 ug via INTRAVENOUS

## 2021-11-19 MED ORDER — DEXMEDETOMIDINE HCL IN NACL 80 MCG/20ML IV SOLN
INTRAVENOUS | Status: DC | PRN
  Administered 2021-11-19: 12 ug via BUCCAL

## 2021-11-19 MED ORDER — MIDAZOLAM HCL 5 MG/5ML IJ SOLN
INTRAMUSCULAR | Status: DC | PRN
  Administered 2021-11-19: 2 mg via INTRAVENOUS

## 2021-11-19 MED ORDER — PHENYLEPHRINE 80 MCG/ML (10ML) SYRINGE FOR IV PUSH (FOR BLOOD PRESSURE SUPPORT)
PREFILLED_SYRINGE | INTRAVENOUS | Status: AC
  Filled 2021-11-19: qty 10

## 2021-11-19 MED ORDER — ACETAMINOPHEN 325 MG PO TABS: 325.0000 mg | ORAL_TABLET | ORAL | Status: DC | PRN

## 2021-11-19 MED ORDER — LIDOCAINE 2% (20 MG/ML) 5 ML SYRINGE
INTRAMUSCULAR | Status: DC | PRN
  Administered 2021-11-19: 60 mg via INTRAVENOUS

## 2021-11-19 MED ORDER — PROPOFOL 10 MG/ML IV BOLUS
INTRAVENOUS | Status: AC
  Filled 2021-11-19: qty 20

## 2021-11-19 MED ORDER — MIDAZOLAM HCL 2 MG/2ML IJ SOLN
INTRAMUSCULAR | Status: AC
  Filled 2021-11-19: qty 2

## 2021-11-19 MED ORDER — PROPOFOL 500 MG/50ML IV EMUL
INTRAVENOUS | Status: DC | PRN
  Administered 2021-11-19: 200 ug/kg/min via INTRAVENOUS

## 2021-11-19 MED ORDER — PROPOFOL 10 MG/ML IV BOLUS
INTRAVENOUS | Status: DC | PRN
  Administered 2021-11-19: 50 mg via INTRAVENOUS
  Administered 2021-11-19: 150 mg via INTRAVENOUS

## 2021-11-19 MED ORDER — CEFAZOLIN SODIUM-DEXTROSE 2-4 GM/100ML-% IV SOLN
INTRAVENOUS | Status: AC
  Filled 2021-11-19: qty 100

## 2021-11-19 MED ORDER — MEPERIDINE HCL 25 MG/ML IJ SOLN: 6.2500 mg | INTRAMUSCULAR | Status: DC | PRN

## 2021-11-19 MED ORDER — DEXMEDETOMIDINE HCL IN NACL 80 MCG/20ML IV SOLN
INTRAVENOUS | Status: AC
  Filled 2021-11-19: qty 20

## 2021-11-19 MED ORDER — ONDANSETRON HCL 4 MG/2ML IJ SOLN: 4.0000 mg | Freq: Once | INTRAMUSCULAR | Status: DC | PRN

## 2021-11-19 MED ORDER — OXYCODONE HCL 5 MG PO TABS: 5.0000 mg | ORAL_TABLET | Freq: Once | ORAL | Status: DC | PRN

## 2021-11-19 MED ORDER — FENTANYL CITRATE (PF) 100 MCG/2ML IJ SOLN
25.0000 ug | INTRAMUSCULAR | Status: DC | PRN
  Administered 2021-11-19: 50 ug via INTRAVENOUS

## 2021-11-19 MED ORDER — FENTANYL CITRATE (PF) 100 MCG/2ML IJ SOLN
INTRAMUSCULAR | Status: DC | PRN
  Administered 2021-11-19 (×2): 50 ug via INTRAVENOUS

## 2021-11-19 MED ORDER — HEPARIN SOD (PORK) LOCK FLUSH 100 UNIT/ML IV SOLN
INTRAVENOUS | Status: AC
  Filled 2021-11-19: qty 5

## 2021-11-19 MED ORDER — ACETAMINOPHEN 500 MG PO TABS: 1000.0000 mg | ORAL_TABLET | ORAL | Status: AC

## 2021-11-19 MED ORDER — FENTANYL CITRATE (PF) 100 MCG/2ML IJ SOLN
INTRAMUSCULAR | Status: AC
  Filled 2021-11-19: qty 2

## 2021-11-19 MED ORDER — KETOROLAC TROMETHAMINE 30 MG/ML IJ SOLN
INTRAMUSCULAR | Status: AC
  Filled 2021-11-19: qty 1

## 2021-11-19 MED ORDER — ACETAMINOPHEN 160 MG/5ML PO SOLN: 325.0000 mg | ORAL | Status: DC | PRN

## 2021-11-19 MED ORDER — OXYCODONE HCL 5 MG/5ML PO SOLN: 5.0000 mg | Freq: Once | ORAL | Status: DC | PRN

## 2021-11-19 MED ORDER — HEPARIN SOD (PORK) LOCK FLUSH 100 UNIT/ML IV SOLN
INTRAVENOUS | Status: DC | PRN
  Administered 2021-11-19: 400 [IU] via INTRAVENOUS

## 2021-11-19 MED ORDER — HEPARIN (PORCINE) IN NACL 1000-0.9 UT/500ML-% IV SOLN
INTRAVENOUS | Status: AC
  Filled 2021-11-19: qty 500

## 2021-11-19 MED ORDER — KETOROLAC TROMETHAMINE 30 MG/ML IJ SOLN
INTRAMUSCULAR | Status: DC | PRN
  Administered 2021-11-19: 30 mg via INTRAVENOUS

## 2021-11-19 MED ORDER — ACETAMINOPHEN 500 MG PO TABS
ORAL_TABLET | ORAL | Status: AC
  Filled 2021-11-19: qty 2

## 2021-11-19 MED ORDER — HEPARIN (PORCINE) IN NACL 2-0.9 UNITS/ML
INTRAMUSCULAR | Status: AC | PRN
  Administered 2021-11-19: 1 via INTRAVENOUS

## 2021-11-19 MED ORDER — ONDANSETRON HCL 4 MG/2ML IJ SOLN
INTRAMUSCULAR | Status: DC | PRN
  Administered 2021-11-19: 4 mg via INTRAVENOUS

## 2021-11-19 MED ORDER — CEFAZOLIN SODIUM-DEXTROSE 2-4 GM/100ML-% IV SOLN
2.0000 g | INTRAVENOUS | Status: AC
  Administered 2021-11-19: 2 g via INTRAVENOUS

## 2021-11-19 MED ORDER — ACETAMINOPHEN 500 MG PO TABS
1000.0000 mg | ORAL_TABLET | Freq: Once | ORAL | Status: AC
  Administered 2021-11-19: 1000 mg via ORAL

## 2021-11-19 MED FILL — Dexamethasone Sodium Phosphate Inj 100 MG/10ML: INTRAMUSCULAR | Qty: 1 | Status: AC

## 2021-11-19 SURGICAL SUPPLY — 50 items
ADH SKN CLS APL DERMABOND .7 (GAUZE/BANDAGES/DRESSINGS) ×1
APL PRP STRL LF DISP 70% ISPRP (MISCELLANEOUS) ×1
APL SKNCLS STERI-STRIP NONHPOA (GAUZE/BANDAGES/DRESSINGS) ×1
BAG DECANTER FOR FLEXI CONT (MISCELLANEOUS) ×1 IMPLANT
BENZOIN TINCTURE PRP APPL 2/3 (GAUZE/BANDAGES/DRESSINGS) ×1 IMPLANT
BLADE SURG 11 STRL SS (BLADE) ×1 IMPLANT
BLADE SURG 15 STRL LF DISP TIS (BLADE) ×1 IMPLANT
BLADE SURG 15 STRL SS (BLADE) ×1
CANISTER SUCT 1200ML W/VALVE (MISCELLANEOUS) IMPLANT
CHLORAPREP W/TINT 26 (MISCELLANEOUS) ×1 IMPLANT
COVER BACK TABLE 60X90IN (DRAPES) ×1 IMPLANT
COVER MAYO STAND STRL (DRAPES) ×1 IMPLANT
COVER PROBE 5X48 (MISCELLANEOUS) ×1
DERMABOND ADVANCED .7 DNX12 (GAUZE/BANDAGES/DRESSINGS) ×1 IMPLANT
DRAPE C-ARM 42X72 X-RAY (DRAPES) ×1 IMPLANT
DRAPE LAPAROSCOPIC ABDOMINAL (DRAPES) ×1 IMPLANT
DRAPE UTILITY XL STRL (DRAPES) ×1 IMPLANT
DRSG TEGADERM 4X4.75 (GAUZE/BANDAGES/DRESSINGS) IMPLANT
ELECT COATED BLADE 2.86 ST (ELECTRODE) ×1 IMPLANT
ELECT REM PT RETURN 9FT ADLT (ELECTROSURGICAL) ×1
ELECTRODE REM PT RTRN 9FT ADLT (ELECTROSURGICAL) ×1 IMPLANT
GAUZE 4X4 16PLY ~~LOC~~+RFID DBL (SPONGE) ×1 IMPLANT
GAUZE SPONGE 4X4 12PLY STRL (GAUZE/BANDAGES/DRESSINGS) IMPLANT
GAUZE SPONGE 4X4 12PLY STRL LF (GAUZE/BANDAGES/DRESSINGS) ×1 IMPLANT
GLOVE BIO SURGEON STRL SZ7 (GLOVE) ×1 IMPLANT
GLOVE BIOGEL PI IND STRL 7.5 (GLOVE) ×1 IMPLANT
GOWN STRL REUS W/ TWL LRG LVL3 (GOWN DISPOSABLE) ×2 IMPLANT
GOWN STRL REUS W/TWL LRG LVL3 (GOWN DISPOSABLE) ×2
IV KIT MINILOC 20X1 SAFETY (NEEDLE) IMPLANT
KIT CVR 48X5XPRB PLUP LF (MISCELLANEOUS) IMPLANT
KIT PORT POWER 8FR ISP CVUE (Port) IMPLANT
NDL HYPO 25X1 1.5 SAFETY (NEEDLE) ×1 IMPLANT
NDL SAFETY ECLIP 18X1.5 (MISCELLANEOUS) IMPLANT
NEEDLE HYPO 25X1 1.5 SAFETY (NEEDLE) ×1 IMPLANT
PACK BASIN DAY SURGERY FS (CUSTOM PROCEDURE TRAY) ×1 IMPLANT
PENCIL SMOKE EVACUATOR (MISCELLANEOUS) ×1 IMPLANT
SLEEVE SCD COMPRESS KNEE MED (STOCKING) ×1 IMPLANT
SPIKE FLUID TRANSFER (MISCELLANEOUS) IMPLANT
STRIP CLOSURE SKIN 1/2X4 (GAUZE/BANDAGES/DRESSINGS) ×1 IMPLANT
SUT MNCRL AB 4-0 PS2 18 (SUTURE) ×1 IMPLANT
SUT PROLENE 2 0 SH DA (SUTURE) ×1 IMPLANT
SUT SILK 2 0 TIES 17X18 (SUTURE)
SUT SILK 2-0 18XBRD TIE BLK (SUTURE) IMPLANT
SUT VIC AB 3-0 SH 27 (SUTURE) ×3
SUT VIC AB 3-0 SH 27X BRD (SUTURE) ×1 IMPLANT
SYR 5ML LUER SLIP (SYRINGE) ×1 IMPLANT
SYR CONTROL 10ML LL (SYRINGE) ×1 IMPLANT
TOWEL GREEN STERILE FF (TOWEL DISPOSABLE) ×1 IMPLANT
TUBE CONNECTING 20X1/4 (TUBING) IMPLANT
YANKAUER SUCT BULB TIP NO VENT (SUCTIONS) IMPLANT

## 2021-11-19 NOTE — Anesthesia Preprocedure Evaluation (Signed)
Anesthesia Evaluation  Patient identified by MRN, date of birth, ID band Patient awake    Reviewed: Allergy & Precautions, NPO status , Patient's Chart, lab work & pertinent test results  Airway Mallampati: I  TM Distance: >3 FB Neck ROM: Full    Dental no notable dental hx. (+) Teeth Intact, Dental Advisory Given   Pulmonary neg pulmonary ROS,    Pulmonary exam normal breath sounds clear to auscultation       Cardiovascular Normal cardiovascular exam+ dysrhythmias (PVCs on inderal)  Rhythm:Regular Rate:Normal     Neuro/Psych  Headaches, negative psych ROS   GI/Hepatic negative GI ROS, Neg liver ROS,   Endo/Other  negative endocrine ROS  Renal/GU negative Renal ROS  negative genitourinary   Musculoskeletal negative musculoskeletal ROS (+)   Abdominal   Peds negative pediatric ROS (+)  Hematology negative hematology ROS (+)   Anesthesia Other Findings   Reproductive/Obstetrics negative OB ROS                             Anesthesia Physical  Anesthesia Plan  ASA: 2  Anesthesia Plan: General   Post-op Pain Management: Tylenol PO (pre-op)*   Induction: Intravenous  PONV Risk Score and Plan: 3 and Ondansetron, Dexamethasone and Treatment may vary due to age or medical condition  Airway Management Planned: LMA  Additional Equipment: None  Intra-op Plan:   Post-operative Plan: Extubation in OR  Informed Consent: I have reviewed the patients History and Physical, chart, labs and discussed the procedure including the risks, benefits and alternatives for the proposed anesthesia with the patient or authorized representative who has indicated his/her understanding and acceptance.     Dental advisory given  Plan Discussed with: CRNA and Anesthesiologist  Anesthesia Plan Comments:         Anesthesia Quick Evaluation

## 2021-11-19 NOTE — Discharge Instructions (Addendum)
PORT-A-CATH: POST OP INSTRUCTIONS  Always review your discharge instruction sheet given to you by the facility where your surgery was performed.   A prescription for pain medication may be given to you upon discharge. Take your pain medication as prescribed, if needed. If narcotic pain medicine is not needed, then you make take acetaminophen (Tylenol) or ibuprofen (Advil) as needed.  Take your usually prescribed medications unless otherwise directed. If you need a refill on your pain medication, please contact our office. All narcotic pain medicine now requires a paper prescription.  Phoned in and fax refills are no longer allowed by law.  Prescriptions will not be filled after 5 pm or on weekends.  You should follow a light diet for the remainder of the day after your procedure. Most patients will experience some mild swelling and/or bruising in the area of the incision. It may take several days to resolve. It is common to experience some constipation if taking pain medication after surgery. Increasing fluid intake and taking a stool softener (such as Colace) will usually help or prevent this problem from occurring. A mild laxative (Milk of Magnesia or Miralax) should be taken according to package directions if there are no bowel movements after 48 hours.  Unless discharge instructions indicate otherwise, you may remove your bandages 48 hours after surgery, and you may shower at that time. You may have steri-strips (small white skin tapes) in place directly over the incision.  These strips should be left on the skin for 7-10 days.  If your surgeon used Dermabond (skin glue) on the incision, you may shower in 24 hours.  The glue will flake off over the next 2-3 weeks.  If your port is left accessed at the end of surgery (needle left in port), the dressing cannot get wet and should only by changed by a healthcare professional. When the port is no longer accessed (when the needle has been removed),  follow step 7.   ACTIVITIES:  Limit activity involving your arms for the next 72 hours. Do no strenuous exercise or activity for 1 week. You may drive when you are no longer taking prescription pain medication, you can comfortably wear a seatbelt, and you can maneuver your car. 10.You may need to see your doctor in the office for a follow-up appointment.  Please       check with your doctor.  11.When you receive a new Port-a-Cath, you will get a product guide and        ID card.  Please keep them in case you need them.  WHEN TO CALL YOUR DOCTOR [phone]): Fever over 101.0 Chills Continued bleeding from incision Increased redness and tenderness at the site Shortness of breath, difficulty breathing   The clinic staff is available to answer your questions during regular business hours. Please don't hesitate to call and ask to speak to one of the nurses or medical assistants for clinical concerns. If you have a medical emergency, go to the nearest emergency room or call 911.  A surgeon from [facility] is always on call at the hospital.     For further information, please visit [email or web address]     Post Anesthesia Home Care Instructions  Activity: Get plenty of rest for the remainder of the day. A responsible individual must stay with you for 24 hours following the procedure.  For the next 24 hours, DO NOT: -Drive a car -Paediatric nurse -Drink alcoholic beverages -Take any medication unless instructed by your  physician -Make any legal decisions or sign important papers.  Meals: Start with liquid foods such as gelatin or soup. Progress to regular foods as tolerated. Avoid greasy, spicy, heavy foods. If nausea and/or vomiting occur, drink only clear liquids until the nausea and/or vomiting subsides. Call your physician if vomiting continues.  Special Instructions/Symptoms: Your throat may feel dry or sore from the anesthesia or the breathing tube  placed in your throat during surgery. If this causes discomfort, gargle with warm salt water. The discomfort should disappear within 24 hours.  If you had a scopolamine patch placed behind your ear for the management of post- operative nausea and/or vomiting:  1. The medication in the patch is effective for 72 hours, after which it should be removed.  Wrap patch in a tissue and discard in the trash. Wash hands thoroughly with soap and water. 2. You may remove the patch earlier than 72 hours if you experience unpleasant side effects which may include dry mouth, dizziness or visual disturbances. 3. Avoid touching the patch. Wash your hands with soap and water after contact with the patch.

## 2021-11-19 NOTE — Interval H&P Note (Signed)
History and Physical Interval Note:  11/19/2021 1:18 PM Oncotype high so will get chemotherapy. Discussed port placement Avannah B Blazier  has presented today for surgery, with the diagnosis of BREAST CANCER.  The various methods of treatment have been discussed with the patient and family. After consideration of risks, benefits and other options for treatment, the patient has consented to  Procedure(s): INSERTION PORT-A-CATH (N/A) as a surgical intervention.  The patient's history has been reviewed, patient examined, no change in status, stable for surgery.  I have reviewed the patient's chart and labs.  Questions were answered to the patient's satisfaction.     Rolm Bookbinder

## 2021-11-19 NOTE — Anesthesia Procedure Notes (Addendum)
Procedure Name: LMA Insertion Date/Time: 11/19/2021 1:45 PM  Performed by: Ezequiel Kayser, CRNAPre-anesthesia Checklist: Patient identified, Emergency Drugs available, Suction available and Patient being monitored Patient Re-evaluated:Patient Re-evaluated prior to induction Oxygen Delivery Method: Circle System Utilized Preoxygenation: Pre-oxygenation with 100% oxygen Induction Type: IV induction Ventilation: Mask ventilation without difficulty LMA: LMA inserted LMA Size: 4.0 Number of attempts: 1 Airway Equipment and Method: Bite block Placement Confirmation: positive ETCO2 Tube secured with: Tape Dental Injury: Teeth and Oropharynx as per pre-operative assessment

## 2021-11-19 NOTE — Op Note (Signed)
Preoperative diagnosis: right  breast cancer, need for venous access Postoperative diagnosis: Same as above Procedure: Left internal jugular port placement  Surgeon: Dr. Serita Grammes Anesthesia: General Estimated blood loss: Minimal Complications: None Drains: None Special count was correct at completion Disposition recovery stable condition  Indications: This is a 57 yof s/p lumpectomy/sn biopsy who has high oncotype. She is due to begin chemotherapy tomorrow and we discussed port placement.   Procedure: After informed consent was obtained the patient was taken to the operating room.  She was given antibiotics.  SCDs were in place.  She was placed under general anesthesia with an LMA.  She was then prepped and draped in the standard sterile surgical fashion.  Surgical timeout was then performed.  I identified the left IJ with the ultrasound.  I made a small nick in the skin.  I accessed the needle under ultrasound guidance.  The wire was placed.  The wire was in good position on the x-ray.  The ultrasound also showed the wire to be in the vein.  I then infiltrated Marcaine and lidocaine below the clavicle.  I made incision and developed a pocket for the port.  I placed a Carroll tendon passer between the sites and brought the line through both of those.  I then placed the dilator under fluoroscopic vision over the wire.  This was in good position.  The wire and the dilator were then removed leaving the sheath in place.  The line was placed in the sheath.  The sheath was then removed.  I pulled the line back to be in the distal SVC.  This was all in good position.  I then connected the port and placed this in the pocket.  I sutured this into position with a 2-0 Prolene suture.  This flushed easily and and aspirated blood.  I then did 1 final x-ray.  I closed this with 3-0 Vicryl and 4-0 Monocryl.  A Steri-Strip was placed over the incision to protect it.  I then accessed the port as she is due to  get chemotherapy.  I placed a large dressing over this.  She tolerated this well was extubated and transferred to recovery stable.

## 2021-11-19 NOTE — Transfer of Care (Signed)
Immediate Anesthesia Transfer of Care Note  Patient: Dia Crawford  Procedure(s) Performed: INSERTION PORT-A-CATH (Left: Chest)  Patient Location: PACU  Anesthesia Type:General  Level of Consciousness: drowsy  Airway & Oxygen Therapy: Patient Spontanous Breathing and Patient connected to face mask oxygen  Post-op Assessment: Report given to RN and Post -op Vital signs reviewed and stable  Post vital signs: Reviewed and stable  Last Vitals:  Vitals Value Taken Time  BP 107/64   Temp 97.6   Pulse 85   Resp 14   SpO2 100     Last Pain:  Vitals:   11/19/21 1144  TempSrc: Oral  PainSc: 0-No pain      Patients Stated Pain Goal: 3 (52/48/18 5909)  Complications: No notable events documented.

## 2021-11-19 NOTE — Anesthesia Postprocedure Evaluation (Signed)
Anesthesia Post Note  Patient: Adrienne Harris  Procedure(s) Performed: INSERTION PORT-A-CATH (Left: Chest)     Patient location during evaluation: PACU Anesthesia Type: General Level of consciousness: awake and alert Pain management: pain level controlled Vital Signs Assessment: post-procedure vital signs reviewed and stable Respiratory status: spontaneous breathing, nonlabored ventilation, respiratory function stable and patient connected to nasal cannula oxygen Cardiovascular status: blood pressure returned to baseline and stable Postop Assessment: no apparent nausea or vomiting Anesthetic complications: no   No notable events documented.  Last Vitals:  Vitals:   11/19/21 1500 11/19/21 1524  BP: 114/80 122/81  Pulse: 78 64  Resp: 11 18  Temp:  36.5 C  SpO2: 99% 99%    Last Pain:  Vitals:   11/19/21 1524  TempSrc: Temporal  PainSc: 2                  Timberly Yott

## 2021-11-19 NOTE — Telephone Encounter (Signed)
FMLA paperwork has been faxed off to [employer] at [phone] and recieved a fax confirmation. Pt will be notified via MyChart that paperwork has been sent.

## 2021-11-20 ENCOUNTER — Inpatient Hospital Stay (HOSPITAL_BASED_OUTPATIENT_CLINIC_OR_DEPARTMENT_OTHER): Payer: 59 | Admitting: Oncology

## 2021-11-20 ENCOUNTER — Encounter: Payer: Self-pay | Admitting: Oncology

## 2021-11-20 ENCOUNTER — Ambulatory Visit: Payer: 59 | Admitting: Oncology

## 2021-11-20 ENCOUNTER — Inpatient Hospital Stay: Payer: 59

## 2021-11-20 VITALS — BP 137/80 | HR 85 | Temp 98.8°F | Resp 16 | Wt 133.2 lb

## 2021-11-20 VITALS — BP 145/85 | HR 82 | Temp 98.5°F | Resp 16

## 2021-11-20 DIAGNOSIS — Z170 Estrogen receptor positive status [ER+]: Secondary | ICD-10-CM

## 2021-11-20 DIAGNOSIS — C50311 Malignant neoplasm of lower-inner quadrant of right female breast: Secondary | ICD-10-CM

## 2021-11-20 DIAGNOSIS — Z5111 Encounter for antineoplastic chemotherapy: Secondary | ICD-10-CM | POA: Diagnosis not present

## 2021-11-20 LAB — COMPREHENSIVE METABOLIC PANEL
ALT: 13 U/L (ref 0–44)
AST: 20 U/L (ref 15–41)
Albumin: 3.8 g/dL (ref 3.5–5.0)
Alkaline Phosphatase: 67 U/L (ref 38–126)
Anion gap: 5 (ref 5–15)
BUN: 13 mg/dL (ref 6–20)
CO2: 28 mmol/L (ref 22–32)
Calcium: 9.1 mg/dL (ref 8.9–10.3)
Chloride: 107 mmol/L (ref 98–111)
Creatinine, Ser: 0.62 mg/dL (ref 0.44–1.00)
GFR, Estimated: >60 mL/min (ref >60)
Glucose, Bld: 121 mg/dL — ABNORMAL HIGH (ref 70–99)
Potassium: 3.9 mmol/L (ref 3.5–5.1)
Sodium: 140 mmol/L (ref 135–145)
Total Bilirubin: 0.5 mg/dL (ref 0.3–1.2)
Total Protein: 6.9 g/dL (ref 6.5–8.1)

## 2021-11-20 LAB — CBC WITH DIFFERENTIAL/PLATELET
Abs Immature Granulocytes: 0.01 10*3/uL (ref 0.00–0.07)
Basophils Absolute: 0 10*3/uL (ref 0.0–0.1)
Basophils Relative: 1 %
Eosinophils Absolute: 0.1 10*3/uL (ref 0.0–0.5)
Eosinophils Relative: 2 %
HCT: 41.3 % (ref 36.0–46.0)
Hemoglobin: 14.2 g/dL (ref 12.0–15.0)
Immature Granulocytes: 0 %
Lymphocytes Relative: 18 %
Lymphs Abs: 1.2 10*3/uL (ref 0.7–4.0)
MCH: 33.6 pg (ref 26.0–34.0)
MCHC: 34.4 g/dL (ref 30.0–36.0)
MCV: 97.9 fL (ref 80.0–100.0)
Monocytes Absolute: 0.6 10*3/uL (ref 0.1–1.0)
Monocytes Relative: 9 %
Neutro Abs: 4.7 10*3/uL (ref 1.7–7.7)
Neutrophils Relative %: 70 %
Platelets: 278 10*3/uL (ref 150–400)
RBC: 4.22 MIL/uL (ref 3.87–5.11)
RDW: 11.9 % (ref 11.5–15.5)
WBC: 6.6 10*3/uL (ref 4.0–10.5)
nRBC: 0 % (ref 0.0–0.2)

## 2021-11-20 MED ORDER — HEPARIN SOD (PORK) LOCK FLUSH 100 UNIT/ML IV SOLN
500.0000 [IU] | Freq: Once | INTRAVENOUS | Status: AC | PRN
  Administered 2021-11-20: 500 [IU]
  Filled 2021-11-20: qty 5

## 2021-11-20 MED ORDER — SODIUM CHLORIDE 0.9 % IV SOLN
Freq: Once | INTRAVENOUS | Status: AC
  Filled 2021-11-20: qty 250

## 2021-11-20 MED ORDER — SODIUM CHLORIDE 0.9 % IV SOLN
10.0000 mg | Freq: Once | INTRAVENOUS | Status: AC
  Administered 2021-11-20: 10 mg via INTRAVENOUS
  Filled 2021-11-20: qty 10

## 2021-11-20 MED ORDER — SODIUM CHLORIDE 0.9 % IV SOLN
1000.0000 mg | Freq: Once | INTRAVENOUS | Status: AC
  Administered 2021-11-20: 1000 mg via INTRAVENOUS
  Filled 2021-11-20: qty 50

## 2021-11-20 MED ORDER — SODIUM CHLORIDE 0.9% FLUSH
10.0000 mL | Freq: Once | INTRAVENOUS | Status: AC
  Administered 2021-11-20: 10 mL via INTRAVENOUS
  Filled 2021-11-20: qty 10

## 2021-11-20 MED ORDER — PALONOSETRON HCL INJECTION 0.25 MG/5ML
0.2500 mg | Freq: Once | INTRAVENOUS | Status: AC
  Administered 2021-11-20: 0.25 mg via INTRAVENOUS
  Filled 2021-11-20: qty 5

## 2021-11-20 MED ORDER — SODIUM CHLORIDE 0.9 % IV SOLN
75.0000 mg/m2 | Freq: Once | INTRAVENOUS | Status: AC
  Administered 2021-11-20: 130 mg via INTRAVENOUS
  Filled 2021-11-20: qty 13

## 2021-11-20 NOTE — Progress Notes (Signed)
Dr. Donne Hazel performed a DC-G to verify tip placement of port a cath which showed port tip in the distal SVC on 11/19/2021. Details are in his operative note on 11/19/2021. Informed Dr. Janese Banks and she checked images as well. Per Dr. Janese Banks, we can use port a cath for chemotherapy treatments. No chest x ray needed.

## 2021-11-20 NOTE — Patient Instructions (Addendum)
[facility] AT [place]  Discharge Instructions: Thank you for choosing [facility] to provide your oncology and hematology care.  If you have a lab appointment with the [facility], please go directly to the [facility] and check in at the registration area.  Wear comfortable clothing and clothing appropriate for easy access to any Portacath or PICC line.   We strive to give you quality time with your provider. You may need to reschedule your appointment if you arrive late (15 or more minutes).  Arriving late affects you and other patients whose appointments are after yours.  Also, if you miss three or more appointments without notifying the office, you may be dismissed from the clinic at the provider's discretion.      For prescription refill requests, have your pharmacy contact our office and allow 72 hours for refills to be completed.    Today you received the following chemotherapy and/or immunotherapy agents Taxotere, Cytoxan      To help prevent nausea and vomiting after your treatment, we encourage you to take your nausea medication as directed.  BELOW ARE SYMPTOMS THAT SHOULD BE REPORTED IMMEDIATELY: *FEVER GREATER THAN 100.4 F (38 C) OR HIGHER *CHILLS OR SWEATING *NAUSEA AND VOMITING THAT IS NOT CONTROLLED WITH YOUR NAUSEA MEDICATION *UNUSUAL SHORTNESS OF BREATH *UNUSUAL BRUISING OR BLEEDING *URINARY PROBLEMS (pain or burning when urinating, or frequent urination) *BOWEL PROBLEMS (unusual diarrhea, constipation, pain near the anus) TENDERNESS IN MOUTH AND THROAT WITH OR WITHOUT PRESENCE OF ULCERS (sore throat, sores in mouth, or a toothache) UNUSUAL RASH, SWELLING OR PAIN  UNUSUAL VAGINAL DISCHARGE OR ITCHING   Items with * indicate a potential emergency and should be followed up as soon as possible or go to the Emergency Department if any problems should occur.  Please show the CHEMOTHERAPY ALERT CARD or IMMUNOTHERAPY ALERT CARD at  check-in to the Emergency Department and triage nurse.  Should you have questions after your visit or need to cancel or reschedule your appointment, please contact [facility] CANCER [place] AT [place]  [phone] and follow the prompts.  Office hours are 8:00 a.m. to 4:30 p.m. Monday - Friday. Please note that voicemails left after 4:00 p.m. may not be returned until the following business day.  We are closed weekends and major holidays. You have access to a nurse at all times for urgent questions. Please call the main number to the clinic [phone] and follow the prompts.  For any non-urgent questions, you may also contact your provider using MyChart. We now offer e-Visits for anyone 35 and older to request care online for non-urgent symptoms. For details visit [email or web address].   Also download the MyChart app! Go to the app store, search "MyChart", open the app, select [facility], and log in with your MyChart username and password.  Masks are optional in the cancer centers. If you would like for your care team to wear a mask while they are taking care of you, please let them know. For doctor visits, patients may have with them one support person who is at least 46 years old. At this time, visitors are not allowed in the infusion area.  Docetaxel Injection What is this medication? DOCETAXEL (doe se TAX el) treats some types of cancer. It works by slowing down the growth of cancer cells. This medicine may be used for other purposes; ask your health care provider or pharmacist if you have questions. COMMON BRAND NAME(S): Docefrez, Taxotere What should I tell my  care team before I take this medication? They need to know if you have any of these conditions: Kidney disease Liver disease Low white blood cell levels Tingling of the fingers or toes or other nerve disorder An unusual or allergic reaction to docetaxel, polysorbate 80, other medications, foods, dyes, or  preservatives Pregnant or trying to get pregnant Breast-feeding How should I use this medication? This medication is injected into a vein. It is given by your care team in a hospital or clinic setting. Talk to your care team about the use of this medication in children. Special care may be needed. Overdosage: If you think you have taken too much of this medicine contact a poison control center or emergency room at once. NOTE: This medicine is only for you. Do not share this medicine with others. What if I miss a dose? Keep appointments for follow-up doses. It is important not to miss your dose. Call your care team if you are unable to keep an appointment. What may interact with this medication? Do not take this medication with any of the following: Live virus vaccines This medication may also interact with the following: Certain antibiotics, such as clarithromycin, telithromycin Certain antivirals for HIV or hepatitis Certain medications for fungal infections, such as itraconazole, ketoconazole, voriconazole Grapefruit juice Nefazodone Supplements, such as St. John's wort This list may not describe all possible interactions. Give your health care provider a list of all the medicines, herbs, non-prescription drugs, or dietary supplements you use. Also tell them if you smoke, drink alcohol, or use illegal drugs. Some items may interact with your medicine. What should I watch for while using this medication? This medication may make you feel generally unwell. This is not uncommon as chemotherapy can affect healthy cells as well as cancer cells. Report any side effects. Continue your course of treatment even though you feel ill unless your care team tells you to stop. You may need blood work done while you are taking this medication. This medication can cause serious side effects and infusion reactions. To reduce the risk, your care team may give you other medications to take before receiving  this one. Be sure to follow the directions from your care team. This medication may increase your risk of getting an infection. Call your care team for advice if you get a fever, chills, sore throat, or other symptoms of a cold or flu. Do not treat yourself. Try to avoid being around people who are sick. Avoid taking medications that contain aspirin, acetaminophen, ibuprofen, naproxen, or ketoprofen unless instructed by your care team. These medications may hide a fever. Be careful brushing or flossing your teeth or using a toothpick because you may get an infection or bleed more easily. If you have any dental work done, tell your dentist you are receiving this medication. Some products may contain alcohol. Ask your care team if this medication contains alcohol. Be sure to tell all care teams you are taking this medicine. Certain medications, like metronidazole and disulfiram, can cause an unpleasant reaction when taken with alcohol. The reaction includes flushing, headache, nausea, vomiting, sweating, and increased thirst. The reaction can last from 30 minutes to several hours. This medication may affect your coordination, reaction time, or judgement. Do not drive or operate machinery until you know how this medication affects you. Sit up or stand slowly to reduce the risk of dizzy or fainting spells. Drinking alcohol with this medication can increase the risk of these side effects. Talk to your  care team about your risk of cancer. You may be more at risk for certain types of cancer if you take this medication. Talk to your care team if you wish to become pregnant or think you might be pregnant. This medication can cause serious birth defects if taken during pregnancy or if you get pregnant within 2 months after stopping therapy. A negative pregnancy test is required before starting this medication. A reliable form of contraception is recommended while taking this medication and for 2 months after stopping  it. Talk to your care team about reliable forms of contraception. Do not breast-feed while taking this medication and for 1 week after stopping therapy. Use a condom during sex and for 4 months after stopping therapy. Tell your care team right away if you think your partner might be pregnant. This medication can cause serious birth defects. This medication may cause infertility. Talk to your care team if you are concerned about your fertility. What side effects may I notice from receiving this medication? Side effects that you should report to your care team as soon as possible: Allergic reactions--skin rash, itching, hives, swelling of the face, lips, tongue, or throat Change in vision such as blurry vision, seeing halos around lights, vision loss Infection--fever, chills, cough, or sore throat Infusion reactions--chest pain, shortness of breath or trouble breathing, feeling faint or lightheaded Low red blood cell level--unusual weakness or fatigue, dizziness, headache, trouble breathing Pain, tingling, or numbness in the hands or feet Painful swelling, warmth, or redness of the skin, blisters or sores at the infusion site Redness, blistering, peeling, or loosening of the skin, including inside the mouth Sudden or severe stomach pain, bloody diarrhea, fever, nausea, vomiting Swelling of the ankles, hands, or feet Tumor lysis syndrome (TLS)--nausea, vomiting, diarrhea, decrease in the amount of urine, dark urine, unusual weakness or fatigue, confusion, muscle pain or cramps, fast or irregular heartbeat, joint pain Unusual bruising or bleeding Side effects that usually do not require medical attention (report to your care team if they continue or are bothersome): Change in nail shape, thickness, or color Change in taste Hair loss Increased tears This list may not describe all possible side effects. Call your doctor for medical advice about side effects. You may report side effects to FDA at  1-800-FDA-1088. Where should I keep my medication? This medication is given in a hospital or clinic. It will not be stored at home. NOTE: This sheet is a summary. It may not cover all possible information. If you have questions about this medicine, talk to your doctor, pharmacist, or health care provider.  2023 Elsevier/Gold Standard (2021-03-29 00:00:00)  Cyclophosphamide Injection What is this medication? CYCLOPHOSPHAMIDE (sye kloe FOSS fa mide) treats some types of cancer. It works by slowing down the growth of cancer cells. This medicine may be used for other purposes; ask your health care provider or pharmacist if you have questions. COMMON BRAND NAME(S): Cyclophosphamide, Cytoxan, Neosar What should I tell my care team before I take this medication? They need to know if you have any of these conditions: Heart disease Irregular heartbeat or rhythm Infection Kidney problems Liver disease Low blood cell levels (white cells, platelets, or red blood cells) Lung disease Previous radiation Trouble passing urine An unusual or allergic reaction to cyclophosphamide, other medications, foods, dyes, or preservatives Pregnant or trying to get pregnant Breast-feeding How should I use this medication? This medication is injected into a vein. It is given by your care team in a hospital or  clinic setting. Talk to your care team about the use of this medication in children. Special care may be needed. Overdosage: If you think you have taken too much of this medicine contact a poison control center or emergency room at once. NOTE: This medicine is only for you. Do not share this medicine with others. What if I miss a dose? Keep appointments for follow-up doses. It is important not to miss your dose. Call your care team if you are unable to keep an appointment. What may interact with this medication? Amphotericin B Amiodarone Azathioprine Certain antivirals for HIV or hepatitis Certain  medications for blood pressure, such as enalapril, lisinopril, quinapril Cyclosporine Diuretics Etanercept Indomethacin Medications that relax muscles Metronidazole Natalizumab Tamoxifen Warfarin This list may not describe all possible interactions. Give your health care provider a list of all the medicines, herbs, non-prescription drugs, or dietary supplements you use. Also tell them if you smoke, drink alcohol, or use illegal drugs. Some items may interact with your medicine. What should I watch for while using this medication? This medication may make you feel generally unwell. This is not uncommon as chemotherapy can affect healthy cells as well as cancer cells. Report any side effects. Continue your course of treatment even though you feel ill unless your care team tells you to stop. You may need blood work while you are taking this medication. This medication may increase your risk of getting an infection. Call your care team for advice if you get a fever, chills, sore throat, or other symptoms of a cold or flu. Do not treat yourself. Try to avoid being around people who are sick. Avoid taking medications that contain aspirin, acetaminophen, ibuprofen, naproxen, or ketoprofen unless instructed by your care team. These medications may hide a fever. Be careful brushing or flossing your teeth or using a toothpick because you may get an infection or bleed more easily. If you have any dental work done, tell your dentist you are receiving this medication. Drink water or other fluids as directed. Urinate often, even at night. Some products may contain alcohol. Ask your care team if this medication contains alcohol. Be sure to tell all care teams you are taking this medicine. Certain medicines, like metronidazole and disulfiram, can cause an unpleasant reaction when taken with alcohol. The reaction includes flushing, headache, nausea, vomiting, sweating, and increased thirst. The reaction can last  from 30 minutes to several hours. Talk to your care team if you wish to become pregnant or think you might be pregnant. This medication can cause serious birth defects if taken during pregnancy and for 1 year after the last dose. A negative pregnancy test is required before starting this medication. A reliable form of contraception is recommended while taking this medication and for 1 year after the last dose. Talk to your care team about reliable forms of contraception. Do not father a child while taking this medication and for 4 months after the last dose. Use a condom during this time period. Do not breast-feed while taking this medication or for 1 week after the last dose. This medication may cause infertility. Talk to your care team if you are concerned about your fertility. Talk to your care team about your risk of cancer. You may be more at risk for certain types of cancer if you take this medication. What side effects may I notice from receiving this medication? Side effects that you should report to your care team as soon as possible: Allergic reactions--skin rash,  itching, hives, swelling of the face, lips, tongue, or throat Dry cough, shortness of breath or trouble breathing Heart failure--shortness of breath, swelling of the ankles, feet, or hands, sudden weight gain, unusual weakness or fatigue Heart muscle inflammation--unusual weakness or fatigue, shortness of breath, chest pain, fast or irregular heartbeat, dizziness, swelling of the ankles, feet, or hands Heart rhythm changes--fast or irregular heartbeat, dizziness, feeling faint or lightheaded, chest pain, trouble breathing Infection--fever, chills, cough, sore throat, wounds that don't heal, pain or trouble when passing urine, general feeling of discomfort or being unwell Kidney injury--decrease in the amount of urine, swelling of the ankles, hands, or feet Liver injury--right upper belly pain, loss of appetite, nausea, light-colored  stool, dark yellow or brown urine, yellowing skin or eyes, unusual weakness or fatigue Low red blood cell level--unusual weakness or fatigue, dizziness, headache, trouble breathing Low sodium level--muscle weakness, fatigue, dizziness, headache, confusion Red or dark brown urine Unusual bruising or bleeding Side effects that usually do not require medical attention (report to your care team if they continue or are bothersome): Hair loss Irregular menstrual cycles or spotting Loss of appetite Nausea Pain, redness, or swelling with sores inside the mouth or throat Vomiting This list may not describe all possible side effects. Call your doctor for medical advice about side effects. You may report side effects to FDA at 1-800-FDA-1088. Where should I keep my medication? This medication is given in a hospital or clinic. It will not be stored at home. NOTE: This sheet is a summary. It may not cover all possible information. If you have questions about this medicine, talk to your doctor, pharmacist, or health care provider.  2023 Elsevier/Gold Standard (2021-05-08 00:00:00)

## 2021-11-20 NOTE — Progress Notes (Signed)
Hematology/Oncology Consult note [facility]  [phone] [phone]  Patient Care Team: [name], FNP as PCP - General (Family Medicine) Daiva Huge, RN as Oncology Nurse Navigator   Name of the patient: Adrienne Harris  [id number]  November 20, 1975   Date of visit: 11/20/21  Diagnosis- pathological prognostic stage Ia invasive mammary carcinoma of the right breast ER/PR positive HER2 negative  Chief complaint/ Reason for visit-on treatment assessment prior to cycle 1 of adjuvant TC chemotherapy  Heme/Onc history: patient is a 46 year old female who recently underwent aLateral screening mammogram which showed a possible area of concern in the right breast.  This was followed by diagnostic mammogram and ultrasound which showed a 7 mm mass in the right lower breast.  No suspicious right axillary adenopathy.  Biopsy confirmed invasive mammary carcinoma 6 mm grade 3 ER 51 to 90% positive PR 51 to 90% positive and HER2 negative.    Oncotype score came back at 23 with an absolute chemotherapy benefit of about 6.5%.  Adjuvant TC chemotherapy was recommended.  Interval history-she had port placement yesterday and does report soreness at the port site but denies other complaints at this time  ECOG PS- 0 Pain scale- 0   Review of systems- Review of Systems  Constitutional:  Negative for chills, fever, malaise/fatigue and weight loss.  HENT:  Negative for congestion, ear discharge and nosebleeds.   Eyes:  Negative for blurred vision.  Respiratory:  Negative for cough, hemoptysis, sputum production, shortness of breath and wheezing.   Cardiovascular:  Negative for chest pain, palpitations, orthopnea and claudication.  Gastrointestinal:  Negative for abdominal pain, blood in stool, constipation, diarrhea, heartburn, melena, nausea and vomiting.  Genitourinary:  Negative for dysuria, flank pain, frequency, hematuria and urgency.  Musculoskeletal:   Negative for back pain, joint pain and myalgias.  Skin:  Negative for rash.  Neurological:  Negative for dizziness, tingling, focal weakness, seizures, weakness and headaches.  Endo/Heme/Allergies:  Does not bruise/bleed easily.  Psychiatric/Behavioral:  Negative for depression and suicidal ideas. The patient does not have insomnia.       Allergies  Allergen Reactions   Propylene Glycol     Skin rash   Hydrocortisone Rash    Allergic to all topical steriods Allergic to all topical corticosteriods   Other Rash    Topical steroid      Past Medical History:  Diagnosis Date   Atopic dermatitis    Basal cell carcinoma    15 years ago.   Dysrhythmia    Hemochromatosis    Heterozygous for two genes   Palpitations    a. 04/2013 48h Holter: sinus rhythm/sinus arrhythmia, rare PAC's/PVC's.   PVC (premature ventricular contraction)    Shoulder pain, left 01/24/2015   Shoulder pain, right 01/24/2015     Past Surgical History:  Procedure Laterality Date   BREAST BIOPSY Right 09/25/2021   BREAST LUMPECTOMY WITH RADIOACTIVE SEED AND SENTINEL LYMPH NODE BIOPSY Right 10/28/2021   Procedure: RIGHT BREAST LUMPECTOMY WITH RADIOACTIVE SEED AND AXILLARY SENTINEL LYMPH NODE BIOPSY;  Surgeon: Rolm Bookbinder, MD;  Location: [place];  Service: General;  Laterality: Right;   COLONOSCOPY WITH PROPOFOL N/A 01/24/2021   Procedure: COLONOSCOPY WITH PROPOFOL;  Surgeon: [name], MD;  Location: [facility];  Service: Endoscopy;  Laterality: N/A;   Excision of basal cell carcinoma     PORTACATH PLACEMENT Left 11/19/2021   Procedure: INSERTION PORT-A-CATH;  Surgeon: Rolm Bookbinder, MD;  Location: [facility];  Service: General;  Laterality: Left;   WISDOM TOOTH EXTRACTION      Social History   Socioeconomic History   Marital status: Married    Spouse name: Not on file   Number of children: 1   Years of education: Not on file   Highest education level:  Not on file  Occupational History   Occupation: Dietitian  Tobacco Use   Smoking status: Never   Smokeless tobacco: Never  Vaping Use   Vaping Use: Never used  Substance and Sexual Activity   Alcohol use: Yes    Alcohol/week: 2.0 standard drinks of alcohol    Types: 2 Cans of beer per week    Comment: 2-4 beer/wine aweek.   Drug use: No   Sexual activity: Yes    Partners: Male  Other Topics Concern   Not on file  Social History Narrative   Married      Daughter -63 yrs.       Dietitian for [employer]   Social Determinants of Health   Financial Resource Strain: Not on file  Food Insecurity: Not on file  Transportation Needs: Not on file  Physical Activity: Inactive (05/15/2017)   Exercise Vital Sign    Days of Exercise per Week: 0 days    Minutes of Exercise per Session: 0 min  Stress: No Stress Concern Present (05/15/2017)   [place]    Feeling of Stress : Only a little  Social Connections: Not on file  Intimate Partner Violence: Not on file    Family History  Problem Relation Age of Onset   Hemochromatosis Mother    Basal cell carcinoma Mother    Hypertension Father    Melanoma Father        multiple removed   Colon polyps Father 21       adenomatous   Breast cancer Paternal Aunt 61   Kidney cancer Paternal Aunt    Alzheimer's disease Paternal Aunt    Hyperlipidemia Maternal Grandmother    Hypertension Maternal Grandmother    Osteoporosis Maternal Grandmother    Hyperlipidemia Maternal Grandfather    Hypertension Maternal Grandfather    Diabetes Maternal Grandfather    Stroke Maternal Grandfather    Alzheimer's disease Paternal Grandmother    Breast cancer Other        late years   Breast cancer Other        late years   Colon cancer Neg Hx      Current Outpatient Medications:    acetaminophen (TYLENOL) 500 MG tablet, Take 1,000 mg by mouth every 8 (eight) hours as needed for moderate  pain., Disp: , Rfl:    cholecalciferol (VITAMIN D) 1000 units tablet, Take 1,000 Units by mouth daily., Disp: , Rfl:    Cyanocobalamin (VITAMIN B 12 PO), Take 1,000 mcg by mouth daily., Disp: , Rfl:    dexamethasone (DECADRON) 4 MG tablet, Take 2 tabs by mouth 2 times daily starting day before chemo. Then take 2 tabs daily for 2 days starting day after chemo. Take with food., Disp: 30 tablet, Rfl: 1   ibuprofen (ADVIL,MOTRIN) 200 MG tablet, Take 400 mg by mouth every 8 (eight) hours as needed for moderate pain., Disp: , Rfl:    lidocaine-prilocaine (EMLA) cream, Apply to affected area once, Disp: 30 g, Rfl: 3   propranolol (INDERAL) 20 MG tablet, Take 1 tablet (20 mg total) by mouth daily., Disp: 90 tablet, Rfl: 3  levonorgestrel-ethinyl estradiol (AVIANE) 0.1-20 MG-MCG tablet, Take 1 tablet by mouth daily. (Patient not taking: Reported on 10/08/2021), Disp: 84 tablet, Rfl: 3   ondansetron (ZOFRAN) 8 MG tablet, Take 1 tablet (8 mg total) by mouth every 8 (eight) hours as needed for nausea or vomiting. Start on the third day after chemotherapy. (Patient not taking: Reported on 11/20/2021), Disp: 30 tablet, Rfl: 1   prochlorperazine (COMPAZINE) 10 MG tablet, Take 1 tablet (10 mg total) by mouth every 6 (six) hours as needed for nausea or vomiting. (Patient not taking: Reported on 11/20/2021), Disp: 30 tablet, Rfl: 1   tacrolimus (PROTOPIC) 0.1 % ointment, Apply to affected areas twice daily as needed (Patient not taking: Reported on 11/13/2021), Disp: 60 g, Rfl: 2   traMADol (ULTRAM) 50 MG tablet, Take 1 tablet (50 mg total) by mouth every 6 (six) hours as needed. (Patient not taking: Reported on 11/20/2021), Disp: 10 tablet, Rfl: 0 No current facility-administered medications for this visit.  Facility-Administered Medications Ordered in Other Visits:    cyclophosphamide (CYTOXAN) 1,000 mg in sodium chloride 0.9 % 250 mL chemo infusion, 1,000 mg, Intravenous, Once, [name], MD, Last Rate: 600  mL/hr at 11/20/21 1220, 1,000 mg at 11/20/21 1220   heparin lock flush 100 unit/mL, 500 Units, Intracatheter, Once PRN, [name], MD  Physical exam:  Vitals:   11/20/21 0852  BP: 137/80  Pulse: 85  Resp: 16  Temp: 98.8 F (37.1 C)  SpO2: 100%  Weight: 133 lb 3.2 oz (60.4 kg)   Physical Exam Constitutional:      General: She is not in acute distress. HENT:     Head: Atraumatic.  Cardiovascular:     Rate and Rhythm: Normal rate and regular rhythm.  Pulmonary:     Effort: Pulmonary effort is normal.     Comments: Left chest wall port in place Skin:    General: Skin is warm and dry.  Neurological:     Mental Status: She is alert and oriented to person, place, and time.         Latest Ref Rng & Units 11/20/2021    8:35 AM  CMP  Glucose 70 - 99 mg/dL 121   BUN 6 - 20 mg/dL 13   Creatinine 0.44 - 1.00 mg/dL 0.62   Sodium 135 - 145 mmol/L 140   Potassium 3.5 - 5.1 mmol/L 3.9   Chloride 98 - 111 mmol/L 107   CO2 22 - 32 mmol/L 28   Calcium 8.9 - 10.3 mg/dL 9.1   Total Protein 6.5 - 8.1 g/dL 6.9   Total Bilirubin 0.3 - 1.2 mg/dL 0.5   Alkaline Phos 38 - 126 U/L 67   AST 15 - 41 U/L 20   ALT 0 - 44 U/L 13       Latest Ref Rng & Units 11/20/2021    8:35 AM  CBC  WBC 4.0 - 10.5 K/uL 6.6   Hemoglobin 12.0 - 15.0 g/dL 14.2   Hematocrit 36.0 - 46.0 % 41.3   Platelets 150 - 400 K/uL 278     No images are attached to the encounter.  DG C-Arm 1-60 Min  Result Date: 11/20/2021 CLINICAL DATA:  Insertion of Port-A-Cath. EXAM: DG C-ARM 1-60 MIN FLUOROSCOPY: Fluoroscopy Time:  26 seconds Radiation Exposure Index (if provided by the fluoroscopic device): 1.85 mGy Number of Acquired Spot Images: 1 COMPARISON:  None Available. FINDINGS: Single fluoroscopic spot view obtained in the operating/procedure room. Left chest port is in  place with tip overlying the mid SVC. No evidence of pneumothorax on the single fluoroscopic spot view. IMPRESSION: Procedural fluoroscopy for  chest port placement. On provided image, the port tip overlies the SVC. Electronically Signed   By: Keith Rake M.D.   On: 11/20/2021 12:12   MM Breast Surgical Specimen  Result Date: 10/28/2021 CLINICAL DATA:  46 year old with biopsy-proven grade 3 invasive ductal carcinoma and DCIS in the lower RIGHT breast. Radioactive seed localization was performed 10/23/2021 in anticipation of today's lumpectomy. EXAM: SPECIMEN RADIOGRAPH OF THE RIGHT BREAST COMPARISON:  Previous exam(s). FINDINGS: Status post excision of the RIGHT breast. The radioactive seed and the ribbon shaped tissue marking clip are present in the non-compressed specimen. The seed is intact. This was discussed with the operating room nurse by telephone at the time of interpretation on 10/28/2021 at 12:33 p.m. IMPRESSION: Specimen radiograph of the RIGHT breast. Electronically Signed   By: Evangeline Dakin M.D.   On: 10/28/2021 12:36  MM CLIP PLACEMENT RIGHT  Result Date: 10/25/2021 CLINICAL DATA:  Status post ultrasound-guided radioactive seed localization prior to surgery. EXAM: 3D DIAGNOSTIC RIGHT MAMMOGRAM POST ULTRASOUND BIOPSY COMPARISON:  Previous exam(s). FINDINGS: 3D Mammographic images were obtained following ultrasound guided radioactive seed placement prior to surgery. IMPRESSION: Appropriate positioning of the radioactive seed adjacent to the ribbon shaped clip. Final Assessment: Post Procedure Mammograms for Marker Placement Electronically Signed   By: Lillia Mountain M.D.   On: 10/25/2021 07:48  [place] RT RADIOACTIVE SEED LOC  Result Date: 10/23/2021 CLINICAL DATA:  Biopsy proven invasive mammary carcinoma in the right breast. Radioactive seed localization prior to surgery. EXAM: ULTRASOUND GUIDED RADIOACTIVE SEED LOCALIZATION OF THE RIGHT BREAST COMPARISON:  Previous exam(s). FINDINGS: Patient presents for radioactive seed localization prior to surgery. I met with the patient and we discussed the procedure of seed localization  including benefits and alternatives. We discussed the high likelihood of a successful procedure. We discussed the risks of the procedure including infection, bleeding, tissue injury and further surgery. We discussed the low dose of radioactivity involved in the procedure. Informed, written consent was given. The usual time-out protocol was performed immediately prior to the procedure. Using ultrasound guidance, sterile technique, 1% lidocaine and an I-125 radioactive seed, the mass in the 6 o'clock region of the right breast was localized using a lateral to medial approach. The follow-up mammogram images confirm the seed in the expected location and were marked for Dr. Donne Hazel. Follow-up survey of the patient confirms presence of the radioactive seed. Order number of I-125 seed:  [id number]. Total activity:  6.834 millicuries reference Date: 09/11/2021 The patient tolerated the procedure well and was released from the [place]. She was given instructions regarding seed removal. IMPRESSION: Radioactive seed localization right breast. No apparent complications. Electronically Signed   By: Lillia Mountain M.D.   On: 10/23/2021 15:01    Assessment and plan- Patient is a 46 y.o. female with pathological prognostic stage Ia invasive mammary carcinoma of the right breast's pT1b N0 M0 ER 51 to 90% positive PR 51 to 90% positive and HER2 negative.  She is here for on treatment assessment prior to cycle 1 of adjuvant TC chemotherapy  Counts okay to proceed with cycle 1 of adjuvant TC chemotherapy today with Udenyca on day 3.  She will start taking Claritin tomorrow onwards for 3 days to reduce the risk of Neulasta induced bone pain.  I will see her back in 3 weeks for cycle 2.  She does have cooling packs  which she intends to use during treatment with Taxotere.  She has as needed nausea medications if need be.   Visit Diagnosis 1. Malignant neoplasm of lower-inner quadrant of right breast of female, estrogen  receptor positive ([place])   2. Encounter for antineoplastic chemotherapy      Dr. Randa Evens, MD, MPH [facility] [phone] 11/20/2021 12:41 PM

## 2021-11-20 NOTE — Progress Notes (Signed)
Pt states she has not started her menstrual since August 23rd;Hcg test negative yesterday. Pt will like to disucss if menstrual will stop with starting chemo.

## 2021-11-21 ENCOUNTER — Encounter: Payer: Self-pay | Admitting: Physical Therapy

## 2021-11-21 ENCOUNTER — Other Ambulatory Visit: Payer: Self-pay

## 2021-11-21 ENCOUNTER — Ambulatory Visit: Payer: 59 | Attending: General Surgery | Admitting: Physical Therapy

## 2021-11-21 DIAGNOSIS — Z483 Aftercare following surgery for neoplasm: Secondary | ICD-10-CM | POA: Diagnosis not present

## 2021-11-21 DIAGNOSIS — R293 Abnormal posture: Secondary | ICD-10-CM | POA: Diagnosis not present

## 2021-11-21 DIAGNOSIS — Z170 Estrogen receptor positive status [ER+]: Secondary | ICD-10-CM | POA: Diagnosis not present

## 2021-11-21 DIAGNOSIS — C50311 Malignant neoplasm of lower-inner quadrant of right female breast: Secondary | ICD-10-CM | POA: Diagnosis not present

## 2021-11-21 NOTE — Therapy (Signed)
OUTPATIENT PHYSICAL THERAPY BREAST CANCER POST OP FOLLOW UP   Patient Name: Adrienne Harris MRN: [id number] DOB:[birth year], 46 y.o., female Today's Date: 11/21/2021   PT End of Session - 11/21/21 0906     Visit Number 2    Number of Visits 3    PT Start Time 0902    PT Stop Time 0945    PT Time Calculation (min) 43 min    Activity Tolerance Patient tolerated treatment well    Behavior During Therapy WFL for tasks assessed/performed             Past Medical History:  Diagnosis Date   Atopic dermatitis    Basal cell carcinoma    15 years ago.   Dysrhythmia    Hemochromatosis    Heterozygous for two genes   Palpitations    a. 04/2013 48h Holter: sinus rhythm/sinus arrhythmia, rare PAC's/PVC's.   PVC (premature ventricular contraction)    Shoulder pain, left 01/24/2015   Shoulder pain, right 01/24/2015   Past Surgical History:  Procedure Laterality Date   BREAST BIOPSY Right 09/25/2021   BREAST LUMPECTOMY WITH RADIOACTIVE SEED AND SENTINEL LYMPH NODE BIOPSY Right 10/28/2021   Procedure: RIGHT BREAST LUMPECTOMY WITH RADIOACTIVE SEED AND AXILLARY SENTINEL LYMPH NODE BIOPSY;  Surgeon: Rolm Bookbinder, MD;  Location: [place];  Service: General;  Laterality: Right;   COLONOSCOPY WITH PROPOFOL N/A 01/24/2021   Procedure: COLONOSCOPY WITH PROPOFOL;  Surgeon: [name], MD;  Location: [facility];  Service: Endoscopy;  Laterality: N/A;   Excision of basal cell carcinoma     PORTACATH PLACEMENT Left 11/19/2021   Procedure: INSERTION PORT-A-CATH;  Surgeon: Rolm Bookbinder, MD;  Location: [place];  Service: General;  Laterality: Left;   WISDOM TOOTH EXTRACTION     Patient Active Problem List   Diagnosis Date Noted   Genetic testing 10/24/2021   Malignant neoplasm of lower-inner quadrant of right breast of female, estrogen receptor positive ([place]) 10/01/2021   Colon cancer screening    Intractable menstrual migraine without status  migrainosus 07/30/2020   Nonallopathic lesion of sacral region 12/30/2017   Trigger point of left shoulder region 10/06/2017   Slipped rib syndrome 08/05/2017   Nonallopathic lesion of rib cage 08/05/2017   Nonallopathic lesion of cervical region 08/05/2017   Nonallopathic lesion of thoracic region 08/05/2017   Nonallopathic lesion of lumbosacral region 08/05/2017   Chronic left shoulder pain 07/10/2017   Chronic LLQ pain 05/17/2017   Atopic dermatitis 07/10/2016   Hepatic cyst 07/10/2016   Routine physical examination 07/10/2016   PVC's (premature ventricular contractions) 04/22/2013   Hemochromatosis 04/22/2013    REFERRING PROVIDER: Dr. Rolm Bookbinder  REFERRING DIAG: Right breast cancer  THERAPY DIAG:  Malignant neoplasm of lower-inner quadrant of right breast of female, estrogen receptor positive ([place])  Abnormal posture  Aftercare following surgery for neoplasm  Rationale for Evaluation and Treatment Rehabilitation  ONSET DATE: 10/28/2021  SUBJECTIVE:  SUBJECTIVE STATEMENT: Patient reports she underwent a right lumpectomy and sentinel node biopsy (5 negative nodes removed) on 10/28/2021. Her Oncotype score was 23 so given her age and grade 2-3 tumor, she is going to undergo chemotherapy (4 cycles of Taxotere and Cytoxan) followed by radiation and anti-estrogen therapy.  PERTINENT HISTORY:  Patient was diagnosed on 09/25/2021 with right grade 3 invasive mammary carcinoma of no particular type. She underwent a right lumpectomy and sentinel node biopsy on 09/28/2021. It is ER/PR positive and HER2 negative and the Ki67 was not measured.   PATIENT GOALS:  Reassess how my recovery is going related to arm function, pain, and swelling.  PAIN:  Are you having pain? Yes: NPRS scale: 1/10 Pain  location: Right posterior upper arm Pain description: numb, burning Aggravating factors: nothing Relieving factors: nothing  PRECAUTIONS: Recent Surgery, right UE Lymphedema risk, Other: Currently undergoing chemotherapy  ACTIVITY LEVEL / LEISURE: She has not returned to exercise   OBJECTIVE:   PATIENT SURVEYS:  QUICK DASH:  Quick Dash - 11/21/21 0001     Open a tight or new jar No difficulty    Do heavy household chores (wash walls, wash floors) No difficulty    Carry a shopping bag or briefcase No difficulty    Wash your back No difficulty    Use a knife to cut food No difficulty    Recreational activities in which you take some force or impact through your arm, shoulder, or hand (golf, hammering, tennis) No difficulty    During the past week, to what extent has your arm, shoulder or hand problem interfered with your normal social activities with family, friends, neighbors, or groups? Not at all    During the past week, to what extent has your arm, shoulder or hand problem limited your work or other regular daily activities Not at all    Arm, shoulder, or hand pain. Mild    Tingling (pins and needles) in your arm, shoulder, or hand None    Difficulty Sleeping Mild difficulty    DASH Score 4.55 %              OBSERVATIONS:  Right axillary and breast incisions are healing well. Some scabs still present in right nipple region but axillary incision is completely healed. No edema, redness, or axillary cording noted.  POSTURE:  Forward head, rounded shoulders  LYMPHEDEMA ASSESSMENT:   UPPER EXTREMITY AROM/PROM:   A/PROM RIGHT   eval   RIGHT 11/21/2021  Shoulder extension 50 47  Shoulder flexion 137 128  Shoulder abduction 166 135  Shoulder internal rotation 66 67  Shoulder external rotation 82 86                          (Blank rows = not tested)   A/PROM LEFT   eval  Shoulder extension 41  Shoulder flexion 144  Shoulder abduction 153  Shoulder internal  rotation 66  Shoulder external rotation 90                          (Blank rows = not tested)      UPPER EXTREMITY STRENGTH: WNL     LYMPHEDEMA ASSESSMENTS:    LANDMARK RIGHT   eval RIGHT 11/21/2021  10 cm proximal to olecranon process 25.3 25.7  Olecranon process 23.6 23.7  10 cm proximal to ulnar styloid process 19.8 19.2  Just proximal to ulnar styloid process 14.8 14.8  Across hand at thumb web space 17.8 18  At base of 2nd digit 5.7 5.6  (Blank rows = not tested)   LANDMARK LEFT   eval LEFT 11/21/2021  10 cm proximal to olecranon process 26.5 26.1  Olecranon process 23.5 23.6  10 cm proximal to ulnar styloid process 19.4 19.3  Just proximal to ulnar styloid process 15.3 15.2  Across hand at thumb web space 17.9 18.3  At base of 2nd digit 5.5 5.6  (Blank rows = not tested)         Surgery type/Date: Right lumpectomy and sentinel node biopsy 10/28/2021 Number of lymph nodes removed: 5 Current/past treatment (chemo, radiation, hormone therapy): Currently undergoing chemotherapy Other symptoms:  Heaviness/tightness Yes Pain Yes Pitting edema No Infections No Decreased scar mobility Yes Stemmer sign No   PATIENT EDUCATION:  Education details: Closed chain flexion and abduction shoulder exercises Person educated: Patient and Spouse Education method: Explanation, Media planner, and Handouts Education comprehension: verbalized understanding and returned demonstration   HOME EXERCISE PROGRAM:  Reviewed previously given post op HEP. Added closed chain flexion and abduction exercises for right shoulder.  ASSESSMENT:  CLINICAL IMPRESSION: Patient is doing very well s/p right lumpectomy and sentinel node biopsy on 10/28/2021. She had 5 nodes removed, all negative. She began chemotherapy yesterday and reports feeling good today. She is lacking some shoulder flexion and abduction ROM but her incisions appear to be healing well and there are no signs of lymphedema.  She will benefit from progressing her HEP and resuming a walking program. She plans to try her new HEP over the month month and return to PT to reassess if she is back to baseline.  Pt will benefit from skilled therapeutic intervention to improve on the following deficits: Decreased knowledge of precautions, impaired UE functional use, pain, decreased ROM, postural dysfunction.   PT treatment/interventions: ADL/Self care home management, Therapeutic exercises, Therapeutic activity, Patient/Family education, Self Care, Manual therapy, and Re-evaluation     GOALS: Goals reviewed with patient? Yes  LONG TERM GOALS:  (STG=LTG)  GOALS Name Target Date  Goal status  1 Pt will demonstrate she has regained full shoulder ROM and function post operatively compared to baselines.  Baseline: 12/02/2021 IN PROGRESS     PLAN: PT FREQUENCY/DURATION: 1 visit in 4 weeks if needed  PLAN FOR NEXT SESSION: Reassess shoulder ROM and determine need for PT   [facility]  [address], [address]  [place] [address]  [phone]  After Breast Cancer Class It is recommended you attend the ABC class to be educated on lymphedema risk reduction. This class is free of charge and lasts for 1 hour. It is a 1-time class. You will need to download the Webex app either on your phone or computer. We will send you a link the night before or the morning of the class. You should be able to click on that link to join the class. This is not a confidential class. You don't have to turn your camera on, but other participants may be able to see your email address. You're scheduled for November 20th at 12:00.  Scar massage You can begin gentle scar massage to you incision sites. Gently place one hand on the incision and move the skin (without sliding on the skin) in various directions. Do this for a few minutes and then you can gently massage either coconut oil or vitamin E cream into the  scars.  Compression garment You should continue wearing your compression  bra until you feel like you no longer have swelling.  Home exercise Program Continue doing the exercises you were given until you feel like you can do them without feeling any tightness at the end.   Walking Program Studies show that 30 minutes of walking per day (fast enough to elevate your heart rate) can significantly reduce the risk of a cancer recurrence. If you can't walk due to other medical reasons, we encourage you to find another activity you could do (like a stationary bike or water exercise).  Posture After breast cancer surgery, people frequently sit with rounded shoulders posture because it puts their incisions on slack and feels better. If you sit like this and scar tissue forms in that position, you can become very tight and have pain sitting or standing with good posture. Try to be aware of your posture and sit and stand up tall to heal properly.  Follow up PT: It is recommended you return every 3 months for the first 3 years following surgery to be assessed on the SOZO machine for an L-Dex score. This helps prevent clinically significant lymphedema in 95% of patients. These follow up screens are 10 minute appointments that you are not billed for. You're scheduled for Dec. 14th at 9:00.   Annia Friendly, [place] 11/21/21 10:13 AM

## 2021-11-21 NOTE — Patient Instructions (Addendum)
Closed Chain: Shoulder Abduction / Adduction - on Wall    One hand on wall, step to side and return. Stepping causes shoulder to abduct and adduct. Step __5_ times, hold 5 seconds, _2__ times per day.  http://ss.exer.us/267   Copyright  VHI. All rights reserved.  Closed Chain: Shoulder Flexion / Extension - on Wall    Hands on wall, step backward. Return. Stepping causes shoulder flexion and extension Step __5_ times, hold 5 seconds, _2__ times per day.  http://ss.exer.us/265   Copyright  VHI. All rights reserved.   After Breast Cancer Class It is recommended you attend the ABC class to be educated on lymphedema risk reduction. This class is free of charge and lasts for 1 hour. It is a 1-time class. You will need to download the Webex app either on your phone or computer. We will send you a link the night before or the morning of the class. You should be able to click on that link to join the class. This is not a confidential class. You don't have to turn your camera on, but other participants may be able to see your email address. You're scheduled for November 20th at 12:00.  Scar massage You can begin gentle scar massage to you incision sites. Gently place one hand on the incision and move the skin (without sliding on the skin) in various directions. Do this for a few minutes and then you can gently massage either coconut oil or vitamin E cream into the scars.  Compression garment You should continue wearing your compression bra until you feel like you no longer have swelling.  Home exercise Program Continue doing the exercises you were given until you feel like you can do them without feeling any tightness at the end.   Walking Program Studies show that 30 minutes of walking per day (fast enough to elevate your heart rate) can significantly reduce the risk of a cancer recurrence. If you can't walk due to other medical reasons, we encourage you to find another activity you could  do (like a stationary bike or water exercise).  Posture After breast cancer surgery, people frequently sit with rounded shoulders posture because it puts their incisions on slack and feels better. If you sit like this and scar tissue forms in that position, you can become very tight and have pain sitting or standing with good posture. Try to be aware of your posture and sit and stand up tall to heal properly.  Follow up PT: It is recommended you return every 3 months for the first 3 years following surgery to be assessed on the SOZO machine for an L-Dex score. This helps prevent clinically significant lymphedema in 95% of patients. These follow up screens are 10 minute appointments that you are not billed for. You're scheduled for Dec. 14th at 9:00.

## 2021-11-22 ENCOUNTER — Inpatient Hospital Stay: Payer: 59

## 2021-11-22 ENCOUNTER — Telehealth: Payer: Self-pay

## 2021-11-22 ENCOUNTER — Encounter: Payer: Self-pay | Admitting: Oncology

## 2021-11-22 VITALS — Temp 98.3°F

## 2021-11-22 DIAGNOSIS — Z5111 Encounter for antineoplastic chemotherapy: Secondary | ICD-10-CM | POA: Diagnosis not present

## 2021-11-22 DIAGNOSIS — C50311 Malignant neoplasm of lower-inner quadrant of right female breast: Secondary | ICD-10-CM | POA: Diagnosis not present

## 2021-11-22 DIAGNOSIS — Z170 Estrogen receptor positive status [ER+]: Secondary | ICD-10-CM | POA: Diagnosis not present

## 2021-11-22 MED ORDER — PEGFILGRASTIM-CBQV 6 MG/0.6ML ~~LOC~~ SOSY
6.0000 mg | PREFILLED_SYRINGE | Freq: Once | SUBCUTANEOUS | Status: AC
  Administered 2021-11-22: 6 mg via SUBCUTANEOUS
  Filled 2021-11-22: qty 0.6

## 2021-11-22 NOTE — Telephone Encounter (Signed)
Telephone call to patient for follow up after receiving first infusion.   Patient states infusion went great.  States eating good and drinking plenty of fluids.   Denies any nausea or vomiting.  Patient does have a rash on both forearms and is seeing Dr. Janese Banks when coming to get injection today.  Encouraged patient to call for any concerns or questions.

## 2021-12-02 ENCOUNTER — Other Ambulatory Visit: Payer: Self-pay

## 2021-12-03 ENCOUNTER — Encounter: Payer: Self-pay | Admitting: Oncology

## 2021-12-09 ENCOUNTER — Ambulatory Visit: Payer: 59

## 2021-12-09 DIAGNOSIS — R293 Abnormal posture: Secondary | ICD-10-CM | POA: Diagnosis not present

## 2021-12-09 DIAGNOSIS — C50311 Malignant neoplasm of lower-inner quadrant of right female breast: Secondary | ICD-10-CM | POA: Diagnosis not present

## 2021-12-09 DIAGNOSIS — Z483 Aftercare following surgery for neoplasm: Secondary | ICD-10-CM

## 2021-12-09 DIAGNOSIS — Z170 Estrogen receptor positive status [ER+]: Secondary | ICD-10-CM | POA: Diagnosis not present

## 2021-12-09 NOTE — Therapy (Signed)
OUTPATIENT PHYSICAL THERAPY BREAST CANCER TREATMENT   Patient Name: Adrienne Harris MRN: [id number] DOB:1975-07-26, 46 y.o., female Today's Date: 12/09/2021   PT End of Session - 12/09/21 0906     Visit Number 3    Number of Visits 11    Date for PT Re-Evaluation 01/06/22    PT Start Time 0902    PT Stop Time 1000    PT Time Calculation (min) 58 min    Activity Tolerance Patient tolerated treatment well    Behavior During Therapy [facility] for tasks assessed/performed             Past Medical History:  Diagnosis Date   Atopic dermatitis    Basal cell carcinoma    15 years ago.   Dysrhythmia    Hemochromatosis    Heterozygous for two genes   Palpitations    a. 04/2013 48h Holter: sinus rhythm/sinus arrhythmia, rare PAC's/PVC's.   PVC (premature ventricular contraction)    Shoulder pain, left 01/24/2015   Shoulder pain, right 01/24/2015   Past Surgical History:  Procedure Laterality Date   BREAST BIOPSY Right 09/25/2021   BREAST LUMPECTOMY WITH RADIOACTIVE SEED AND SENTINEL LYMPH NODE BIOPSY Right 10/28/2021   Procedure: RIGHT BREAST LUMPECTOMY WITH RADIOACTIVE SEED AND AXILLARY SENTINEL LYMPH NODE BIOPSY;  Surgeon: Rolm Bookbinder, MD;  Location: [place];  Service: General;  Laterality: Right;   COLONOSCOPY WITH PROPOFOL N/A 01/24/2021   Procedure: COLONOSCOPY WITH PROPOFOL;  Surgeon: [name], MD;  Location: [facility];  Service: Endoscopy;  Laterality: N/A;   Excision of basal cell carcinoma     PORTACATH PLACEMENT Left 11/19/2021   Procedure: INSERTION PORT-A-CATH;  Surgeon: Rolm Bookbinder, MD;  Location: [place];  Service: General;  Laterality: Left;   WISDOM TOOTH EXTRACTION     Patient Active Problem List   Diagnosis Date Noted   Genetic testing 10/24/2021   Malignant neoplasm of lower-inner quadrant of right breast of female, estrogen receptor positive ([place]) 10/01/2021   Colon cancer screening    Intractable  menstrual migraine without status migrainosus 07/30/2020   Nonallopathic lesion of sacral region 12/30/2017   Trigger point of left shoulder region 10/06/2017   Slipped rib syndrome 08/05/2017   Nonallopathic lesion of rib cage 08/05/2017   Nonallopathic lesion of cervical region 08/05/2017   Nonallopathic lesion of thoracic region 08/05/2017   Nonallopathic lesion of lumbosacral region 08/05/2017   Chronic left shoulder pain 07/10/2017   Chronic LLQ pain 05/17/2017   Atopic dermatitis 07/10/2016   Hepatic cyst 07/10/2016   Routine physical examination 07/10/2016   PVC's (premature ventricular contractions) 04/22/2013   Hemochromatosis 04/22/2013    REFERRING PROVIDER: Dr. Rolm Bookbinder  REFERRING DIAG: Right breast cancer  THERAPY DIAG:  Malignant neoplasm of lower-inner quadrant of right breast of female, estrogen receptor positive ([place])  Abnormal posture  Aftercare following surgery for neoplasm  Rationale for Evaluation and Treatment Rehabilitation  ONSET DATE: 10/28/2021  SUBJECTIVE:  SUBJECTIVE STATEMENT: I noticed some new swelling last week in my Rt axilla but that has resolved now. I do believe I'm left with cording now because of it so I'll need more physical therapy.   PERTINENT HISTORY:  Patient was diagnosed on 09/25/2021 with right grade 3 invasive mammary carcinoma of no particular type. She underwent a right lumpectomy and sentinel node biopsy on 09/28/2021. It is ER/PR positive and HER2 negative and the Ki67 was not measured.   PATIENT GOALS:  Reassess how my recovery is going related to arm function, pain, and swelling.  PAIN:  Are you having pain? No, just feel a pull at my end reach now.  PRECAUTIONS: Recent Surgery, right UE Lymphedema risk, Other: Currently undergoing  chemotherapy  ACTIVITY LEVEL / LEISURE: She has not returned to exercise   OBJECTIVE:   PATIENT SURVEYS:  QUICK DASH:     OBSERVATIONS:  Right axillary and breast incisions are healing well. Some scabs still present in right nipple region but axillary incision is completely healed. No edema, redness, or axillary cording noted.  POSTURE:  Forward head, rounded shoulders  LYMPHEDEMA ASSESSMENT:   UPPER EXTREMITY AROM/PROM:   A/PROM RIGHT   eval   RIGHT 11/21/2021 RIGHT 12/09/21  Shoulder extension 50 47   Shoulder flexion 137 128 142  Shoulder abduction 166 135 121  Shoulder internal rotation 66 67   Shoulder external rotation 82 86                           (Blank rows = not tested)   A/PROM LEFT   eval  Shoulder extension 41  Shoulder flexion 144  Shoulder abduction 153  Shoulder internal rotation 66  Shoulder external rotation 90                          (Blank rows = not tested)      UPPER EXTREMITY STRENGTH: WNL     LYMPHEDEMA ASSESSMENTS:    LANDMARK RIGHT   eval RIGHT 11/21/2021  10 cm proximal to olecranon process 25.3 25.7  Olecranon process 23.6 23.7  10 cm proximal to ulnar styloid process 19.8 19.2  Just proximal to ulnar styloid process 14.8 14.8  Across hand at thumb web space 17.8 18  At base of 2nd digit 5.7 5.6  (Blank rows = not tested)   LANDMARK LEFT   eval LEFT 11/21/2021  10 cm proximal to olecranon process 26.5 26.1  Olecranon process 23.5 23.6  10 cm proximal to ulnar styloid process 19.4 19.3  Just proximal to ulnar styloid process 15.3 15.2  Across hand at thumb web space 17.9 18.3  At base of 2nd digit 5.5 5.6  (Blank rows = not tested)         Surgery type/Date: Right lumpectomy and sentinel node biopsy 10/28/2021 Number of lymph nodes removed: 5 Current/past treatment (chemo, radiation, hormone therapy): Currently undergoing chemotherapy Other symptoms:  Heaviness/tightness Yes Pain Yes Pitting edema  No Infections No Decreased scar mobility Yes Stemmer sign No  TODAY'S TREATMENT 12/09/21 Manual Therapy At beginning of session assessed pts Rt axilla. Her A/ROM of Rt shoulder flexion has improved, but her abduction has decreased due to new cording. This is visible at Rt axilla and stretches into her upper arm. This is limiting to her end ROM reaches than when she was here a few weeks ago.  MFR to Rt axilla and upper arm at  area of cording P/ROM in supine to Rt shoulder into flexion, abduction and D2 to pts available end motions STM superior to incision where scar tissue palpable, also in axilla over scar tissue noted here at base of cording Scap Mobs in Lt S/L to Rt scapula into protraction and retraction      PATIENT EDUCATION:  Education details: Closed chain flexion and abduction shoulder exercises Person educated: Patient and Spouse Education method: Explanation, Media planner, and Handouts Education comprehension: verbalized understanding and returned demonstration   HOME EXERCISE PROGRAM:  Reviewed previously given post op HEP. Added closed chain flexion and abduction exercises for right shoulder.  ASSESSMENT:  CLINICAL IMPRESSION: Pt had her first chemotherapy on 10/11 and report has been feeling better this week since having that. She noticed new area of swelling at inferior aspect of her axilla last week. This has since resolved but she noted the cording after that. So she will benefit from continuing physical therapy at this time to improve her end A/ROM, especially abduction, and work to decrease new cording. Leone Payor, PT was present during session to assess pt and determine need for renewing her POC.   Pt will benefit from skilled therapeutic intervention to improve on the following deficits: Decreased knowledge of precautions, impaired UE functional use, pain, decreased ROM, postural dysfunction, manual therapy, manual lymph drainage, PROM, joint mobilizations, and  dry needling.   PT treatment/interventions: ADL/Self care home management, Therapeutic exercises, Therapeutic activity, Patient/Family education, Self Care, Manual therapy, and Re-evaluation     GOALS: Goals reviewed with patient? Yes  LONG TERM GOALS:  (STG=LTG)  GOALS Name Target Date  Goal status  1 Pt will demonstrate she has regained full shoulder ROM and function post operatively compared to baselines.  Baseline: 01/06/2022 IN PROGRESS  2 Patient reports >/= 50% less tightness and discomfort at end ROM with right shoulder flexion and abduction to tolerate reaching. 01/06/2022 INITIAL  3 Patient will demonstrate no visible sign of axillary cording for improved overall ROM. 01/06/2022 INITIAL     PLAN: PT FREQUENCY/DURATION: 2x/wk, x4 weeks  PLAN FOR NEXT SESSION: Renewal done this visit. Manual therapy techniques to decrease Rt axillary cording and improve her Rt shoulder A/ROM.   Collie Siad, PTA 12/09/21 10:13 AM   PT assessed pt while PTA worked with her. She has visible cording present in right axillary region with a small amount of edema. She reported feeling pull at end ROM flexion and abduction. This is a new change since she was seen post op. New goals established and new plan of care established. Annia Friendly, [place] 12/09/21 4:42 PM

## 2021-12-10 MED FILL — Dexamethasone Sodium Phosphate Inj 100 MG/10ML: INTRAMUSCULAR | Qty: 1 | Status: AC

## 2021-12-11 ENCOUNTER — Encounter: Payer: Self-pay | Admitting: *Deleted

## 2021-12-11 ENCOUNTER — Encounter: Payer: Self-pay | Admitting: Oncology

## 2021-12-11 ENCOUNTER — Inpatient Hospital Stay: Payer: 59

## 2021-12-11 ENCOUNTER — Other Ambulatory Visit: Payer: Self-pay

## 2021-12-11 ENCOUNTER — Inpatient Hospital Stay (HOSPITAL_BASED_OUTPATIENT_CLINIC_OR_DEPARTMENT_OTHER): Payer: 59 | Admitting: Oncology

## 2021-12-11 ENCOUNTER — Inpatient Hospital Stay: Payer: 59 | Attending: Oncology

## 2021-12-11 VITALS — BP 129/85 | HR 93 | Temp 97.4°F | Resp 16 | Wt 136.2 lb

## 2021-12-11 VITALS — BP 130/64 | HR 89 | Resp 16

## 2021-12-11 DIAGNOSIS — L270 Generalized skin eruption due to drugs and medicaments taken internally: Secondary | ICD-10-CM | POA: Diagnosis not present

## 2021-12-11 DIAGNOSIS — Z5111 Encounter for antineoplastic chemotherapy: Secondary | ICD-10-CM

## 2021-12-11 DIAGNOSIS — C50311 Malignant neoplasm of lower-inner quadrant of right female breast: Secondary | ICD-10-CM

## 2021-12-11 DIAGNOSIS — Z170 Estrogen receptor positive status [ER+]: Secondary | ICD-10-CM

## 2021-12-11 DIAGNOSIS — Z5189 Encounter for other specified aftercare: Secondary | ICD-10-CM | POA: Insufficient documentation

## 2021-12-11 DIAGNOSIS — Z79899 Other long term (current) drug therapy: Secondary | ICD-10-CM | POA: Diagnosis not present

## 2021-12-11 LAB — CBC WITH DIFFERENTIAL/PLATELET
Abs Immature Granulocytes: 0.11 10*3/uL — ABNORMAL HIGH (ref 0.00–0.07)
Basophils Absolute: 0 10*3/uL (ref 0.0–0.1)
Basophils Relative: 0 %
Eosinophils Absolute: 0 10*3/uL (ref 0.0–0.5)
Eosinophils Relative: 0 %
HCT: 36.2 % (ref 36.0–46.0)
Hemoglobin: 12.3 g/dL (ref 12.0–15.0)
Immature Granulocytes: 1 %
Lymphocytes Relative: 9 %
Lymphs Abs: 1.3 10*3/uL (ref 0.7–4.0)
MCH: 33.1 pg (ref 26.0–34.0)
MCHC: 34 g/dL (ref 30.0–36.0)
MCV: 97.3 fL (ref 80.0–100.0)
Monocytes Absolute: 1.5 10*3/uL — ABNORMAL HIGH (ref 0.1–1.0)
Monocytes Relative: 10 %
Neutro Abs: 11.9 10*3/uL — ABNORMAL HIGH (ref 1.7–7.7)
Neutrophils Relative %: 80 %
Platelets: 432 10*3/uL — ABNORMAL HIGH (ref 150–400)
RBC: 3.72 MIL/uL — ABNORMAL LOW (ref 3.87–5.11)
RDW: 12.6 % (ref 11.5–15.5)
WBC: 14.9 10*3/uL — ABNORMAL HIGH (ref 4.0–10.5)
nRBC: 0 % (ref 0.0–0.2)

## 2021-12-11 LAB — COMPREHENSIVE METABOLIC PANEL
ALT: 19 U/L (ref 0–44)
AST: 23 U/L (ref 15–41)
Albumin: 3.8 g/dL (ref 3.5–5.0)
Alkaline Phosphatase: 80 U/L (ref 38–126)
Anion gap: 6 (ref 5–15)
BUN: 16 mg/dL (ref 6–20)
CO2: 24 mmol/L (ref 22–32)
Calcium: 9.1 mg/dL (ref 8.9–10.3)
Chloride: 109 mmol/L (ref 98–111)
Creatinine, Ser: 0.49 mg/dL (ref 0.44–1.00)
GFR, Estimated: >60 mL/min (ref >60)
Glucose, Bld: 126 mg/dL — ABNORMAL HIGH (ref 70–99)
Potassium: 3.8 mmol/L (ref 3.5–5.1)
Sodium: 139 mmol/L (ref 135–145)
Total Bilirubin: 0.4 mg/dL (ref 0.3–1.2)
Total Protein: 7 g/dL (ref 6.5–8.1)

## 2021-12-11 MED ORDER — BETAMETHASONE DIPROPIONATE 0.05 % EX CREA
TOPICAL_CREAM | Freq: Two times a day (BID) | CUTANEOUS | 0 refills | Status: DC
  Filled 2021-12-11: qty 30, 30d supply, fill #0

## 2021-12-11 MED ORDER — PALONOSETRON HCL INJECTION 0.25 MG/5ML
0.2500 mg | Freq: Once | INTRAVENOUS | Status: AC
  Administered 2021-12-11: 0.25 mg via INTRAVENOUS
  Filled 2021-12-11: qty 5

## 2021-12-11 MED ORDER — SODIUM CHLORIDE 0.9 % IV SOLN
Freq: Once | INTRAVENOUS | Status: AC
  Filled 2021-12-11: qty 250

## 2021-12-11 MED ORDER — SODIUM CHLORIDE 0.9 % IV SOLN
10.0000 mg | Freq: Once | INTRAVENOUS | Status: AC
  Administered 2021-12-11: 10 mg via INTRAVENOUS
  Filled 2021-12-11: qty 10

## 2021-12-11 MED ORDER — HEPARIN SOD (PORK) LOCK FLUSH 100 UNIT/ML IV SOLN
500.0000 [IU] | Freq: Once | INTRAVENOUS | Status: AC | PRN
  Administered 2021-12-11: 500 [IU]
  Filled 2021-12-11: qty 5

## 2021-12-11 MED ORDER — SODIUM CHLORIDE 0.9 % IV SOLN
75.0000 mg/m2 | Freq: Once | INTRAVENOUS | Status: AC
  Administered 2021-12-11: 130 mg via INTRAVENOUS
  Filled 2021-12-11: qty 13

## 2021-12-11 MED ORDER — SODIUM CHLORIDE 0.9 % IV SOLN
1000.0000 mg | Freq: Once | INTRAVENOUS | Status: AC
  Administered 2021-12-11: 1000 mg via INTRAVENOUS
  Filled 2021-12-11: qty 50

## 2021-12-11 NOTE — Patient Instructions (Signed)
[facility] CANCER CTR AT [place]  Discharge Instructions: Thank you for choosing [facility] to provide your oncology and hematology care.  If you have a lab appointment with the [facility], please go directly to the [facility] and check in at the registration area.  Wear comfortable clothing and clothing appropriate for easy access to any Portacath or PICC line.   We strive to give you quality time with your provider. You may need to reschedule your appointment if you arrive late (15 or more minutes).  Arriving late affects you and other patients whose appointments are after yours.  Also, if you miss three or more appointments without notifying the office, you may be dismissed from the clinic at the provider's discretion.      For prescription refill requests, have your pharmacy contact our office and allow 72 hours for refills to be completed.    Today you received the following chemotherapy and/or immunotherapy agents Taxotere & Cytoxan      To help prevent nausea and vomiting after your treatment, we encourage you to take your nausea medication as directed.  BELOW ARE SYMPTOMS THAT SHOULD BE REPORTED IMMEDIATELY: *FEVER GREATER THAN 100.4 F (38 C) OR HIGHER *CHILLS OR SWEATING *NAUSEA AND VOMITING THAT IS NOT CONTROLLED WITH YOUR NAUSEA MEDICATION *UNUSUAL SHORTNESS OF BREATH *UNUSUAL BRUISING OR BLEEDING *URINARY PROBLEMS (pain or burning when urinating, or frequent urination) *BOWEL PROBLEMS (unusual diarrhea, constipation, pain near the anus) TENDERNESS IN MOUTH AND THROAT WITH OR WITHOUT PRESENCE OF ULCERS (sore throat, sores in mouth, or a toothache) UNUSUAL RASH, SWELLING OR PAIN  UNUSUAL VAGINAL DISCHARGE OR ITCHING   Items with * indicate a potential emergency and should be followed up as soon as possible or go to the Emergency Department if any problems should occur.  Please show the CHEMOTHERAPY ALERT CARD or IMMUNOTHERAPY ALERT CARD at  check-in to the Emergency Department and triage nurse.  Should you have questions after your visit or need to cancel or reschedule your appointment, please contact [facility] CANCER [place] AT [place]  [phone] and follow the prompts.  Office hours are 8:00 a.m. to 4:30 p.m. Monday - Friday. Please note that voicemails left after 4:00 p.m. may not be returned until the following business day.  We are closed weekends and major holidays. You have access to a nurse at all times for urgent questions. Please call the main number to the clinic [phone] and follow the prompts.  For any non-urgent questions, you may also contact your provider using MyChart. We now offer e-Visits for anyone 71 and older to request care online for non-urgent symptoms. For details visit [email or web address].   Also download the MyChart app! Go to the app store, search "MyChart", open the app, select [facility], and log in with your MyChart username and password.  Masks are optional in the cancer centers. If you would like for your care team to wear a mask while they are taking care of you, please let them know. For doctor visits, patients may have with them one support person who is at least 46 years old. At this time, visitors are not allowed in the infusion area.

## 2021-12-11 NOTE — Progress Notes (Signed)
Hematology/Oncology Consult note [facility]  [phone] [phone]  Patient Care Team: [name], FNP as PCP - General (Family Medicine) Daiva Huge, RN as Oncology Nurse Navigator   Name of the patient: Adrienne Harris  [id number]  [birth year]   Date of visit: 12/11/21  Diagnosis-  pathological prognostic stage Ia invasive mammary carcinoma of the right breast ER/PR positive HER2 negative  Chief complaint/ Reason for visit-on treatment assessment prior to cycle 2 of adjuvant TC chemotherapy  Heme/Onc history: patient is a 46 year old female who recently underwent aLateral screening mammogram which showed a possible area of concern in the right breast.  This was followed by diagnostic mammogram and ultrasound which showed a 7 mm mass in the right lower breast.  No suspicious right axillary adenopathy.  Biopsy confirmed invasive mammary carcinoma 6 mm grade 3 ER 51 to 90% positive PR 51 to 90% positive and HER2 negative.     Oncotype score came back at 23 with an absolute chemotherapy benefit of about 6.5%.  Adjuvant TC chemotherapy was recommended.  Interval history-she has tolerated chemo well other than development of skin rash especially over her bilateral thighs.  She did take a dose of Decadron for chemo induced nausea prevention which has made the rash less prominent and itching not as bad.  For about a few days after chemotherapy she experienced some uneasiness along with gas and bloating which was self-limited.  ECOG PS- 0 Pain scale- 0   Review of systems- Review of Systems  Constitutional:  Positive for malaise/fatigue. Negative for chills, fever and weight loss.  HENT:  Negative for congestion, ear discharge and nosebleeds.   Eyes:  Negative for blurred vision.  Respiratory:  Negative for cough, hemoptysis, sputum production, shortness of breath and wheezing.   Cardiovascular:  Negative for chest pain, palpitations,  orthopnea and claudication.  Gastrointestinal:  Negative for abdominal pain, blood in stool, constipation, diarrhea, heartburn, melena, nausea and vomiting.  Genitourinary:  Negative for dysuria, flank pain, frequency, hematuria and urgency.  Musculoskeletal:  Negative for back pain, joint pain and myalgias.  Skin:  Positive for rash.  Neurological:  Negative for dizziness, tingling, focal weakness, seizures, weakness and headaches.  Endo/Heme/Allergies:  Does not bruise/bleed easily.  Psychiatric/Behavioral:  Negative for depression and suicidal ideas. The patient does not have insomnia.       Allergies  Allergen Reactions   Propylene Glycol     Skin rash   Hydrocortisone Rash    Allergic to all topical steriods Allergic to all topical corticosteriods   Other Rash    Topical steroid      Past Medical History:  Diagnosis Date   Atopic dermatitis    Basal cell carcinoma    15 years ago.   Dysrhythmia    Hemochromatosis    Heterozygous for two genes   Palpitations    a. 04/2013 48h Holter: sinus rhythm/sinus arrhythmia, rare PAC's/PVC's.   PVC (premature ventricular contraction)    Shoulder pain, left 01/24/2015   Shoulder pain, right 01/24/2015     Past Surgical History:  Procedure Laterality Date   BREAST BIOPSY Right 09/25/2021   BREAST LUMPECTOMY WITH RADIOACTIVE SEED AND SENTINEL LYMPH NODE BIOPSY Right 10/28/2021   Procedure: RIGHT BREAST LUMPECTOMY WITH RADIOACTIVE SEED AND AXILLARY SENTINEL LYMPH NODE BIOPSY;  Surgeon: Rolm Bookbinder, MD;  Location: [facility];  Service: General;  Laterality: Right;   COLONOSCOPY WITH PROPOFOL N/A 01/24/2021   Procedure: COLONOSCOPY WITH  PROPOFOL;  Surgeon: [name], MD;  Location[facility] ENDOSCOPY;  Service: Endoscopy;  Laterality: N/A;   Excision of basal cell carcinoma     PORTACATH PLACEMENT Left 11/19/2021   Procedure: INSERTION PORT-A-CATH;  Surgeon: Rolm Bookbinder, MD;  Location: [facility];  Service: General;  Laterality: Left;   WISDOM TOOTH EXTRACTION      Social History   Socioeconomic History   Marital status: Married    Spouse name: Not on file   Number of children: 1   Years of education: Not on file   Highest education level: Not on file  Occupational History   Occupation: Dietitian  Tobacco Use   Smoking status: Never   Smokeless tobacco: Never  Vaping Use   Vaping Use: Never used  Substance and Sexual Activity   Alcohol use: Yes    Alcohol/week: 2.0 standard drinks of alcohol    Types: 2 Cans of beer per week    Comment: 2-4 beer/wine aweek.   Drug use: No   Sexual activity: Yes    Partners: Male  Other Topics Concern   Not on file  Social History Narrative   Married      Daughter -72 yrs.       Dietitian for [employer]   Social Determinants of Health   Financial Resource Strain: Not on file  Food Insecurity: Not on file  Transportation Needs: Not on file  Physical Activity: Inactive (05/15/2017)   Exercise Vital Sign    Days of Exercise per Week: 0 days    Minutes of Exercise per Session: 0 min  Stress: No Stress Concern Present (05/15/2017)   [place]    Feeling of Stress : Only a little  Social Connections: Not on file  Intimate Partner Violence: Not on file    Family History  Problem Relation Age of Onset   Hemochromatosis Mother    Basal cell carcinoma Mother    Hypertension Father    Melanoma Father        multiple removed   Colon polyps Father 73       adenomatous   Breast cancer Paternal Aunt 47   Kidney cancer Paternal Aunt    Alzheimer's disease Paternal Aunt    Hyperlipidemia Maternal Grandmother    Hypertension Maternal Grandmother    Osteoporosis Maternal Grandmother    Hyperlipidemia Maternal Grandfather    Hypertension Maternal Grandfather    Diabetes Maternal Grandfather    Stroke Maternal Grandfather    Alzheimer's disease Paternal  Grandmother    Breast cancer Other        late years   Breast cancer Other        late years   Colon cancer Neg Hx      Current Outpatient Medications:    acetaminophen (TYLENOL) 500 MG tablet, Take 1,000 mg by mouth every 8 (eight) hours as needed for moderate pain., Disp: , Rfl:    betamethasone dipropionate 0.05 % cream, Apply topically 2 (two) times daily., Disp: 30 g, Rfl: 0   cholecalciferol (VITAMIN D) 1000 units tablet, Take 1,000 Units by mouth daily., Disp: , Rfl:    Cyanocobalamin (VITAMIN B 12 PO), Take 1,000 mcg by mouth daily., Disp: , Rfl:    dexamethasone (DECADRON) 4 MG tablet, Take 2 tabs by mouth 2 times daily starting day before chemo. Then take 2 tabs daily for 2 days starting day after chemo. Take with food., Disp: 30 tablet, Rfl: 1  ibuprofen (ADVIL,MOTRIN) 200 MG tablet, Take 400 mg by mouth every 8 (eight) hours as needed for moderate pain., Disp: , Rfl:    lidocaine-prilocaine (EMLA) cream, Apply to affected area once, Disp: 30 g, Rfl: 3   loperamide (IMODIUM) 2 MG capsule, Take by mouth as needed for diarrhea or loose stools., Disp: , Rfl:    loratadine (CLARITIN) 10 MG tablet, Take 10 mg by mouth daily., Disp: , Rfl:    propranolol (INDERAL) 20 MG tablet, Take 1 tablet (20 mg total) by mouth daily., Disp: 90 tablet, Rfl: 3   tacrolimus (PROTOPIC) 0.1 % ointment, Apply to affected areas twice daily as needed, Disp: 60 g, Rfl: 2   levonorgestrel-ethinyl estradiol (AVIANE) 0.1-20 MG-MCG tablet, Take 1 tablet by mouth daily. (Patient not taking: Reported on 10/08/2021), Disp: 84 tablet, Rfl: 3   ondansetron (ZOFRAN) 8 MG tablet, Take 1 tablet (8 mg total) by mouth every 8 (eight) hours as needed for nausea or vomiting. Start on the third day after chemotherapy. (Patient not taking: Reported on 11/20/2021), Disp: 30 tablet, Rfl: 1   prochlorperazine (COMPAZINE) 10 MG tablet, Take 1 tablet (10 mg total) by mouth every 6 (six) hours as needed for nausea or vomiting.  (Patient not taking: Reported on 11/20/2021), Disp: 30 tablet, Rfl: 1   traMADol (ULTRAM) 50 MG tablet, Take 1 tablet (50 mg total) by mouth every 6 (six) hours as needed. (Patient not taking: Reported on 11/20/2021), Disp: 10 tablet, Rfl: 0  Physical exam:  Vitals:   12/11/21 0907  BP: 129/85  Pulse: 93  Resp: 16  Temp: (!) 97.4 F (36.3 C)  SpO2: 99%  Weight: 136 lb 3.2 oz (61.8 kg)   Physical Exam Constitutional:      General: She is not in acute distress. Cardiovascular:     Rate and Rhythm: Normal rate and regular rhythm.     Heart sounds: Normal heart sounds.  Pulmonary:     Effort: Pulmonary effort is normal.  Skin:    General: Skin is warm and dry.     Comments: There is a maculopapular erythematous rash present over bilateral thighs mainly over the posterior aspect  Neurological:     Mental Status: She is alert and oriented to person, place, and time.         Latest Ref Rng & Units 12/11/2021    8:46 AM  CMP  Glucose 70 - 99 mg/dL 126   BUN 6 - 20 mg/dL 16   Creatinine 0.44 - 1.00 mg/dL 0.49   Sodium 135 - 145 mmol/L 139   Potassium 3.5 - 5.1 mmol/L 3.8   Chloride 98 - 111 mmol/L 109   CO2 22 - 32 mmol/L 24   Calcium 8.9 - 10.3 mg/dL 9.1   Total Protein 6.5 - 8.1 g/dL 7.0   Total Bilirubin 0.3 - 1.2 mg/dL 0.4   Alkaline Phos 38 - 126 U/L 80   AST 15 - 41 U/L 23   ALT 0 - 44 U/L 19       Latest Ref Rng & Units 12/11/2021    8:46 AM  CBC  WBC 4.0 - 10.5 K/uL 14.9   Hemoglobin 12.0 - 15.0 g/dL 12.3   Hematocrit 36.0 - 46.0 % 36.2   Platelets 150 - 400 K/uL 432     No images are attached to the encounter.  DG C-Arm 1-60 Min  Result Date: 11/20/2021 CLINICAL DATA:  Insertion of Port-A-Cath. EXAM: DG C-ARM 1-60 MIN FLUOROSCOPY: Fluoroscopy Time:  26 seconds Radiation Exposure Index (if provided by the fluoroscopic device): 1.85 mGy Number of Acquired Spot Images: 1 COMPARISON:  None Available. FINDINGS: Single fluoroscopic spot view obtained in the  operating/procedure room. Left chest port is in place with tip overlying the mid SVC. No evidence of pneumothorax on the single fluoroscopic spot view. IMPRESSION: Procedural fluoroscopy for chest port placement. On provided image, the port tip overlies the SVC. Electronically Signed   By: Keith Rake M.D.   On: 11/20/2021 12:12     Assessment and plan- Patient is a 46 y.o. female with pathological prognostic stage Ia invasive mammary carcinoma of the right breast's pT1b N0 M0 ER 51 to 90% positive PR 51 to 90% positive and HER2 negative. she is here for on treatment assessment prior to cycle 2 of adjuvant TC chemotherapy  Counts okay to proceed with cycle 2 of adjuvant TC chemotherapy today with Udenyca on day 3.  Suspect leukocytosis and thrombocytosis is reactive.  I will see her back in 3 weeks for cycle 3.  Plan is to complete 4 cycles followed by adjuvant radiation therapy.  Drug-induced skin rash: Suspect this is secondary to Taxotere.  She does have sensitivity to topical steroid creams and as per her dermatologist it is the least likely that she would react to class C drugs and betamethasone is one of them.  I will send her prescription for topical betamethasone which she will apply for her skin rash over her thigh   Visit Diagnosis 1. Malignant neoplasm of lower-inner quadrant of right breast of female, estrogen receptor positive ([place])   2. Encounter for antineoplastic chemotherapy      Dr. Randa Evens, MD, MPH [facility] [phone] 12/11/2021 1:21 PM

## 2021-12-11 NOTE — Progress Notes (Signed)
Pt experienced diarrhea and loose stools week after first tx. Loose stools(3 or less a day); and two episodes of watery diarrhea throughout the week. Along with cramps and gas; in vaginal area pt expereinced itchiness but not while urinating. Noticed dizziness when changing postions from sitting to standing.

## 2021-12-12 ENCOUNTER — Ambulatory Visit: Payer: 59 | Attending: General Surgery | Admitting: Rehabilitation

## 2021-12-12 ENCOUNTER — Telehealth: Payer: Self-pay | Admitting: *Deleted

## 2021-12-12 ENCOUNTER — Encounter: Payer: Self-pay | Admitting: Rehabilitation

## 2021-12-12 DIAGNOSIS — Z170 Estrogen receptor positive status [ER+]: Secondary | ICD-10-CM | POA: Diagnosis not present

## 2021-12-12 DIAGNOSIS — C50311 Malignant neoplasm of lower-inner quadrant of right female breast: Secondary | ICD-10-CM | POA: Insufficient documentation

## 2021-12-12 DIAGNOSIS — Z483 Aftercare following surgery for neoplasm: Secondary | ICD-10-CM | POA: Diagnosis not present

## 2021-12-12 DIAGNOSIS — R293 Abnormal posture: Secondary | ICD-10-CM | POA: Diagnosis not present

## 2021-12-12 NOTE — Telephone Encounter (Signed)
Called patient today because I was not able to get in touch with her yesterday and I did send her a MyChart message about the cream for if the patient gets another rash.  Today I got in touch with her and she says that she went and picked up the cream and Dr. Janese Banks looked on her list and the class C which was the safest of the steroids and sent in beta Methasone.  Today while I was on the phone with the patient she says that she has had a little bit of itching no rash at all and at this time she is not using the cream but has not at home if needed.  I told the patient that she can give [place] a call if anything else comes back or if she does get a rash and that cream is not working to definitely let us know and she will let us know

## 2021-12-12 NOTE — Therapy (Signed)
OUTPATIENT PHYSICAL THERAPY BREAST CANCER TREATMENT   Patient Name: Adrienne Harris MRN: [id number] DOB:07/04/1975, 46 y.o.,, female Today's Date: 12/12/2021   PT End of Session - 12/12/21 0908     Visit Number 4    Number of Visits 11    Date for PT Re-Evaluation 01/06/22    PT Start Time 0901    PT Stop Time 0944    PT Time Calculation (min) 43 min    Activity Tolerance Patient tolerated treatment well    Behavior During Therapy WFL for tasks assessed/performed              Past Medical History:  Diagnosis Date   Atopic dermatitis    Basal cell carcinoma    15 years ago.   Dysrhythmia    Hemochromatosis    Heterozygous for two genes   Palpitations    a. 04/2013 48h Holter: sinus rhythm/sinus arrhythmia, rare PAC's/PVC's.   PVC (premature ventricular contraction)    Shoulder pain, left 01/24/2015   Shoulder pain, right 01/24/2015   Past Surgical History:  Procedure Laterality Date   BREAST BIOPSY Right 09/25/2021   BREAST LUMPECTOMY WITH RADIOACTIVE SEED AND SENTINEL LYMPH NODE BIOPSY Right 10/28/2021   Procedure: RIGHT BREAST LUMPECTOMY WITH RADIOACTIVE SEED AND AXILLARY SENTINEL LYMPH NODE BIOPSY;  Surgeon: Wakefield, Matthew, MD;  Location: [facility];  Service: General;  Laterality: Right;   COLONOSCOPY WITH PROPOFOL N/A 01/24/2021   Procedure: COLONOSCOPY WITH PROPOFOL;  Surgeon: Wohl, Darren, MD;  Location: [facility];  Service: Endoscopy;  Laterality: N/A;   Excision of basal cell carcinoma     PORTACATH PLACEMENT Left 11/19/2021   Procedure: INSERTION PORT-A-CATH;  Surgeon: Wakefield, Matthew, MD;  Location: [facility];  Service: General;  Laterality: Left;   WISDOM TOOTH EXTRACTION     Patient Active Problem List   Diagnosis Date Noted   Genetic testing 10/24/2021   Malignant neoplasm of lower-inner quadrant of right breast of female, estrogen receptor positive (HCC) 10/01/2021   Colon cancer screening    Intractable  menstrual migraine without status migrainosus 07/30/2020   Nonallopathic lesion of sacral region 12/30/2017   Trigger point of left shoulder region 10/06/2017   Slipped rib syndrome 08/05/2017   Nonallopathic lesion of rib cage 08/05/2017   Nonallopathic lesion of cervical region 08/05/2017   Nonallopathic lesion of thoracic region 08/05/2017   Nonallopathic lesion of lumbosacral region 08/05/2017   Chronic left shoulder pain 07/10/2017   Chronic LLQ pain 05/17/2017   Atopic dermatitis 07/10/2016   Hepatic cyst 07/10/2016   Routine physical examination 07/10/2016   PVC's (premature ventricular contractions) 04/22/2013   Hemochromatosis 04/22/2013    REFERRING PROVIDER: Dr. Matthew Wakefield  REFERRING DIAG: Right breast cancer  THERAPY DIAG:  Malignant neoplasm of lower-inner quadrant of right breast of female, estrogen receptor positive (HCC)  Abnormal posture  Aftercare following surgery for neoplasm  Rationale for Evaluation and Treatment Rehabilitation  ONSET DATE: 10/28/2021  SUBJECTIVE:                                                                                                                                                                                             SUBJECTIVE STATEMENT: She feels like she is gaining more ROM back. She didn't notice any additional swelling. She said that she had her second chemotherapy yesterday and is feeling okay.  PERTINENT HISTORY:  Patient was diagnosed on 09/25/2021 with right grade 3 invasive mammary carcinoma of no particular type. She underwent a right lumpectomy and sentinel node biopsy on 09/28/2021. It is ER/PR positive and HER2 negative and the Ki67 was not measured.   PATIENT GOALS:  Reassess how my recovery is going related to arm function, pain, and swelling.  PAIN:  Are you having pain? No, just feel a pull at my end reach now.  PRECAUTIONS: Recent Surgery, right UE Lymphedema risk, Other: Currently undergoing  chemotherapy  ACTIVITY LEVEL / LEISURE: She has not returned to exercise   OBJECTIVE:   PATIENT SURVEYS:  QUICK DASH:     OBSERVATIONS:  Right axillary and breast incisions are healing well. Some scabs still present in right nipple region but axillary incision is completely healed. No edema, redness, or axillary cording noted.  POSTURE:  Forward head, rounded shoulders  LYMPHEDEMA ASSESSMENT:   UPPER EXTREMITY AROM/PROM:   A/PROM RIGHT   eval   RIGHT 11/21/2021 RIGHT 12/09/21  Shoulder extension 50 47   Shoulder flexion 137 128 142  Shoulder abduction 166 135 121  Shoulder internal rotation 66 67   Shoulder external rotation 82 86                           (Blank rows = not tested)   A/PROM LEFT   eval  Shoulder extension 41  Shoulder flexion 144  Shoulder abduction 153  Shoulder internal rotation 66  Shoulder external rotation 90                          (Blank rows = not tested)      UPPER EXTREMITY STRENGTH: WNL     LYMPHEDEMA ASSESSMENTS:    LANDMARK RIGHT   eval RIGHT 11/21/2021  10 cm proximal to olecranon process 25.3 25.7  Olecranon process 23.6 23.7  10 cm proximal to ulnar styloid process 19.8 19.2  Just proximal to ulnar styloid process 14.8 14.8  Across hand at thumb web space 17.8 18  At base of 2nd digit 5.7 5.6  (Blank rows = not tested)   LANDMARK LEFT   eval LEFT 11/21/2021  10 cm proximal to olecranon process 26.5 26.1  Olecranon process 23.5 23.6  10 cm proximal to ulnar styloid process 19.4 19.3  Just proximal to ulnar styloid process 15.3 15.2  Across hand at thumb web space 17.9 18.3  At base of 2nd digit 5.5 5.6  (Blank rows = not tested)         Surgery type/Date: Right lumpectomy and sentinel node biopsy 10/28/2021 Number of lymph nodes removed: 5 Current/past treatment (chemo, radiation, hormone therapy): Currently undergoing chemotherapy Other symptoms:  Heaviness/tightness Yes Pain Yes Pitting edema  No Infections No Decreased scar mobility Yes Stemmer sign No  TODAY'S TREATMENT 12/09/21 Manual Therapy MFR to Rt axilla and upper arm at area of cording P/ROM in supine to Rt shoulder into flexion, abduction and D2 to pts available end motions STM superior to incision where scar tissue palpable, also in axilla over scar tissue noted here at base of cording Scap Mobs in Lt S/L to Rt scapula into protraction and retraction Exercise: Pulleys flexion and abduction 2 min  Ball roll  up flexion and abduction 1x10 Sidelying shoulder abduction   12/09/21 Manual Therapy At beginning of session assessed pts Rt axilla. Her A/ROM of Rt shoulder flexion has improved, but her abduction has decreased due to new cording. This is visible at Rt axilla and stretches into her upper arm. This is limiting to her end ROM reaches than when she was here a few weeks ago.  MFR to Rt axilla and upper arm at area of cording P/ROM in supine to Rt shoulder into flexion, abduction and D2 to pts available end motions STM superior to incision where scar tissue palpable, also in axilla over scar tissue noted here at base of cording Scap Mobs in Lt S/L to Rt scapula into protraction and retraction      PATIENT EDUCATION:  Education details: Closed chain flexion and abduction shoulder exercises Person educated: Patient and Spouse Education method: Explanation, Media planner, and Handouts Education comprehension: verbalized understanding and returned demonstration   HOME EXERCISE PROGRAM:  Reviewed previously given post op HEP. Added closed chain flexion and abduction exercises for right shoulder.  ASSESSMENT:  CLINICAL IMPRESSION: Pt had her second chemotherapy on 11/1/ and report she is feeling okay today. Patient had some decrease in tightness after stretching and manual  stretching to her cording. So she will benefit from continuing physical therapy at this time to improve her end A/ROM, especially  abduction, and work to decrease new cording.   Pt will benefit from skilled therapeutic intervention to improve on the following deficits: Decreased knowledge of precautions, impaired UE functional use, pain, decreased ROM, postural dysfunction, manual therapy, manual lymph drainage, PROM, joint mobilizations, and dry needling.   PT treatment/interventions: ADL/Self care home management, Therapeutic exercises, Therapeutic activity, Patient/Family education, Self Care, Manual therapy, and Re-evaluation     GOALS: Goals reviewed with patient? Yes  LONG TERM GOALS:  (STG=LTG)  GOALS Name Target Date  Goal status  1 Pt will demonstrate she has regained full shoulder ROM and function post operatively compared to baselines.  Baseline: 01/06/2022 IN PROGRESS  2 Patient reports >/= 50% less tightness and discomfort at end ROM with right shoulder flexion and abduction to tolerate reaching. 01/06/2022 INITIAL  3 Patient will demonstrate no visible sign of axillary cording for improved overall ROM. 01/06/2022 INITIAL     PLAN: PT FREQUENCY/DURATION: 2x/wk, x4 weeks  PLAN FOR NEXT SESSION: Manual therapy techniques to decrease Rt axillary cording and improve her Rt shoulder A/ROM. Pulleys, ball Geralyn Corwin, Student-PT 12/12/2021  9:48 AM

## 2021-12-13 ENCOUNTER — Inpatient Hospital Stay: Payer: 59

## 2021-12-13 DIAGNOSIS — C50311 Malignant neoplasm of lower-inner quadrant of right female breast: Secondary | ICD-10-CM | POA: Diagnosis not present

## 2021-12-13 DIAGNOSIS — Z170 Estrogen receptor positive status [ER+]: Secondary | ICD-10-CM | POA: Diagnosis not present

## 2021-12-13 DIAGNOSIS — Z79899 Other long term (current) drug therapy: Secondary | ICD-10-CM | POA: Diagnosis not present

## 2021-12-13 DIAGNOSIS — L270 Generalized skin eruption due to drugs and medicaments taken internally: Secondary | ICD-10-CM | POA: Diagnosis not present

## 2021-12-13 DIAGNOSIS — Z5189 Encounter for other specified aftercare: Secondary | ICD-10-CM | POA: Diagnosis not present

## 2021-12-13 DIAGNOSIS — Z5111 Encounter for antineoplastic chemotherapy: Secondary | ICD-10-CM | POA: Diagnosis not present

## 2021-12-13 MED ORDER — PEGFILGRASTIM-CBQV 6 MG/0.6ML ~~LOC~~ SOSY
6.0000 mg | PREFILLED_SYRINGE | Freq: Once | SUBCUTANEOUS | Status: AC
  Administered 2021-12-13: 6 mg via SUBCUTANEOUS
  Filled 2021-12-13: qty 0.6

## 2021-12-16 ENCOUNTER — Ambulatory Visit: Payer: 59

## 2021-12-16 ENCOUNTER — Telehealth: Payer: Self-pay | Admitting: Radiation Oncology

## 2021-12-16 DIAGNOSIS — Z170 Estrogen receptor positive status [ER+]: Secondary | ICD-10-CM

## 2021-12-16 DIAGNOSIS — Z483 Aftercare following surgery for neoplasm: Secondary | ICD-10-CM

## 2021-12-16 DIAGNOSIS — C50311 Malignant neoplasm of lower-inner quadrant of right female breast: Secondary | ICD-10-CM | POA: Diagnosis not present

## 2021-12-16 DIAGNOSIS — R293 Abnormal posture: Secondary | ICD-10-CM | POA: Diagnosis not present

## 2021-12-16 NOTE — Telephone Encounter (Signed)
11/6 @ 3:23 pm Left voicemail for patient to call our office to be schedule for consult with Dr. Isidore Moos.

## 2021-12-16 NOTE — Therapy (Signed)
OUTPATIENT PHYSICAL THERAPY BREAST CANCER TREATMENT   Patient Name: Adrienne Harris MRN: [id number] DOB:[birth year], 46 y.o., female Today's Date: 12/16/2021   PT End of Session - 12/16/21 1359     Visit Number 5    Number of Visits 11    Date for PT Re-Evaluation 01/06/22    PT Start Time 1400    PT Stop Time 1456    PT Time Calculation (min) 56 min    Activity Tolerance Patient tolerated treatment well    Behavior During Therapy WFL for tasks assessed/performed              Past Medical History:  Diagnosis Date   Atopic dermatitis    Basal cell carcinoma    15 years ago.   Dysrhythmia    Hemochromatosis    Heterozygous for two genes   Palpitations    a. 04/2013 48h Holter: sinus rhythm/sinus arrhythmia, rare PAC's/PVC's.   PVC (premature ventricular contraction)    Shoulder pain, left 01/24/2015   Shoulder pain, right 01/24/2015   Past Surgical History:  Procedure Laterality Date   BREAST BIOPSY Right 09/25/2021   BREAST LUMPECTOMY WITH RADIOACTIVE SEED AND SENTINEL LYMPH NODE BIOPSY Right 10/28/2021   Procedure: RIGHT BREAST LUMPECTOMY WITH RADIOACTIVE SEED AND AXILLARY SENTINEL LYMPH NODE BIOPSY;  Surgeon: Rolm Bookbinder, MD;  Location: [place];  Service: General;  Laterality: Right;   COLONOSCOPY WITH PROPOFOL N/A 01/24/2021   Procedure: COLONOSCOPY WITH PROPOFOL;  Surgeon: [name], MD;  Location: [facility];  Service: Endoscopy;  Laterality: N/A;   Excision of basal cell carcinoma     PORTACATH PLACEMENT Left 11/19/2021   Procedure: INSERTION PORT-A-CATH;  Surgeon: Rolm Bookbinder, MD;  Location: [place];  Service: General;  Laterality: Left;   WISDOM TOOTH EXTRACTION     Patient Active Problem List   Diagnosis Date Noted   Genetic testing 10/24/2021   Malignant neoplasm of lower-inner quadrant of right breast of female, estrogen receptor positive ([place]) 10/01/2021   Colon cancer screening    Intractable  menstrual migraine without status migrainosus 07/30/2020   Nonallopathic lesion of sacral region 12/30/2017   Trigger point of left shoulder region 10/06/2017   Slipped rib syndrome 08/05/2017   Nonallopathic lesion of rib cage 08/05/2017   Nonallopathic lesion of cervical region 08/05/2017   Nonallopathic lesion of thoracic region 08/05/2017   Nonallopathic lesion of lumbosacral region 08/05/2017   Chronic left shoulder pain 07/10/2017   Chronic LLQ pain 05/17/2017   Atopic dermatitis 07/10/2016   Hepatic cyst 07/10/2016   Routine physical examination 07/10/2016   PVC's (premature ventricular contractions) 04/22/2013   Hemochromatosis 04/22/2013    REFERRING PROVIDER: Dr. Rolm Bookbinder  REFERRING DIAG: Right breast cancer  THERAPY DIAG:  Malignant neoplasm of lower-inner quadrant of right breast of female, estrogen receptor positive ([place])  Abnormal posture  Aftercare following surgery for neoplasm  Rationale for Evaluation and Treatment Rehabilitation  ONSET DATE: 10/28/2021  SUBJECTIVE:  SUBJECTIVE STATEMENT: I had my second round of chemo last Wednesday so I was a little tired over the weekend. I also had some constipation as well but nothing I couldn't self manage.   PERTINENT HISTORY:  Patient was diagnosed on 09/25/2021 with right grade 3 invasive mammary carcinoma of no particular type. She underwent a right lumpectomy and sentinel node biopsy on 09/28/2021. It is ER/PR positive and HER2 negative and the Ki67 was not measured.   PATIENT GOALS:  Reassess how my recovery is going related to arm function, pain, and swelling.  PAIN:  Are you having pain? No, just still feel a pull at my end reach now.   PRECAUTIONS: Recent Surgery, right UE Lymphedema risk, Other: Currently undergoing  chemotherapy  ACTIVITY LEVEL / LEISURE: She has not returned to exercise   OBJECTIVE:   PATIENT SURVEYS:  QUICK DASH:     OBSERVATIONS:  Right axillary and breast incisions are healing well. Some scabs still present in right nipple region but axillary incision is completely healed. No edema, redness, or axillary cording noted.  POSTURE:  Forward head, rounded shoulders  LYMPHEDEMA ASSESSMENT:   UPPER EXTREMITY AROM/PROM:   A/PROM RIGHT   eval   RIGHT 11/21/2021 RIGHT 12/09/21  Shoulder extension 50 47   Shoulder flexion 137 128 142  Shoulder abduction 166 135 121  Shoulder internal rotation 66 67   Shoulder external rotation 82 86                           (Blank rows = not tested)   A/PROM LEFT   eval  Shoulder extension 41  Shoulder flexion 144  Shoulder abduction 153  Shoulder internal rotation 66  Shoulder external rotation 90                          (Blank rows = not tested)      UPPER EXTREMITY STRENGTH: WNL     LYMPHEDEMA ASSESSMENTS:    LANDMARK RIGHT   eval RIGHT 11/21/2021  10 cm proximal to olecranon process 25.3 25.7  Olecranon process 23.6 23.7  10 cm proximal to ulnar styloid process 19.8 19.2  Just proximal to ulnar styloid process 14.8 14.8  Across hand at thumb web space 17.8 18  At base of 2nd digit 5.7 5.6  (Blank rows = not tested)   LANDMARK LEFT   eval LEFT 11/21/2021  10 cm proximal to olecranon process 26.5 26.1  Olecranon process 23.5 23.6  10 cm proximal to ulnar styloid process 19.4 19.3  Just proximal to ulnar styloid process 15.3 15.2  Across hand at thumb web space 17.9 18.3  At base of 2nd digit 5.5 5.6  (Blank rows = not tested)         Surgery type/Date: Right lumpectomy and sentinel node biopsy 10/28/2021 Number of lymph nodes removed: 5 Current/past treatment (chemo, radiation, hormone therapy): Currently undergoing chemotherapy Other symptoms:  Heaviness/tightness Yes Pain Yes Pitting edema  No Infections No Decreased scar mobility Yes Stemmer sign No  TODAY'S TREATMENT 12/16/21 Exercise: Pulleys flexion and abduction 2 min  each returning therapist demo Ball roll up flexion and abduction 1x10 each Modified downward dog on wall 5x, 5 sec holds  Manual Therapy MFR to Rt axilla and upper arm at area of cording P/ROM in supine to Rt shoulder into flexion, abduction and D2 to pts available end motions and with scapular depression applied throughout  STM superior to incision where scar tissue palpable, also in axilla over scar tissue noted here at base of cording Scap Mobs in Lt S/L to Rt scapula into protraction and retraction and prolonged holds pulling scapula away from rib cage  12/09/21 Manual Therapy MFR to Rt axilla and upper arm at area of cording P/ROM in supine to Rt shoulder into flexion, abduction and D2 to pts available end motions STM superior to incision where scar tissue palpable, also in axilla over scar tissue noted here at base of cording Scap Mobs in Lt S/L to Rt scapula into protraction and retraction Exercise: Pulleys flexion and abduction 2 min  Ball roll up flexion and abduction 1x10 Sidelying shoulder abduction   12/09/21 Manual Therapy At beginning of session assessed pts Rt axilla. Her A/ROM of Rt shoulder flexion has improved, but her abduction has decreased due to new cording. This is visible at Rt axilla and stretches into her upper arm. This is limiting to her end ROM reaches than when she was here a few weeks ago.  MFR to Rt axilla and upper arm at area of cording P/ROM in supine to Rt shoulder into flexion, abduction and D2 to pts available end motions STM superior to incision where scar tissue palpable, also in axilla over scar tissue noted here at base of cording Scap Mobs in Lt S/L to Rt scapula into protraction and retraction      PATIENT EDUCATION:  Education details: Closed chain flexion and abduction shoulder exercises Person  educated: Patient and Spouse Education method: Explanation, Media planner, and Handouts Education comprehension: verbalized understanding and returned demonstration   HOME EXERCISE PROGRAM:  Reviewed previously given post op HEP. Added closed chain flexion and abduction exercises for right shoulder.  ASSESSMENT:  CLINICAL IMPRESSION: Pt returns after having her second chemotherapy last week. She reports fatigue with some constipation over weekend but was able to self manage. Today continued with AA/ROM to help improve her end Rt shoulder ROM and manual therapy to decrease Rt upper quadrant tightness.   Pt will benefit from skilled therapeutic intervention to improve on the following deficits: Decreased knowledge of precautions, impaired UE functional use, pain, decreased ROM, postural dysfunction, manual therapy, manual lymph drainage, PROM, joint mobilizations, and dry needling.   PT treatment/interventions: ADL/Self care home management, Therapeutic exercises, Therapeutic activity, Patient/Family education, Self Care, Manual therapy, and Re-evaluation     GOALS: Goals reviewed with patient? Yes  LONG TERM GOALS:  (STG=LTG)  GOALS Name Target Date  Goal status  1 Pt will demonstrate she has regained full shoulder ROM and function post operatively compared to baselines.  Baseline: 01/06/2022 IN PROGRESS  2 Patient reports >/= 50% less tightness and discomfort at end ROM with right shoulder flexion and abduction to tolerate reaching. 01/06/2022 INITIAL  3 Patient will demonstrate no visible sign of axillary cording for improved overall ROM. 01/06/2022 INITIAL     PLAN: PT FREQUENCY/DURATION: 2x/wk, x4 weeks  PLAN FOR NEXT SESSION:  Instruct and add supine scapular series to HEP; Manual therapy techniques to decrease Rt axillary cording and improve her Rt shoulder A/ROM. Pulleys, ball Eppie Gibson, PTA 12/16/21 2:58 PM

## 2021-12-20 ENCOUNTER — Other Ambulatory Visit: Payer: Self-pay

## 2021-12-20 ENCOUNTER — Other Ambulatory Visit: Payer: Self-pay | Admitting: Oncology

## 2021-12-20 ENCOUNTER — Ambulatory Visit: Payer: 59

## 2021-12-20 DIAGNOSIS — C50311 Malignant neoplasm of lower-inner quadrant of right female breast: Secondary | ICD-10-CM | POA: Diagnosis not present

## 2021-12-20 DIAGNOSIS — Z170 Estrogen receptor positive status [ER+]: Secondary | ICD-10-CM | POA: Diagnosis not present

## 2021-12-20 DIAGNOSIS — R293 Abnormal posture: Secondary | ICD-10-CM

## 2021-12-20 DIAGNOSIS — Z483 Aftercare following surgery for neoplasm: Secondary | ICD-10-CM

## 2021-12-20 MED ORDER — DEXAMETHASONE 4 MG PO TABS
4.0000 mg | ORAL_TABLET | Freq: Two times a day (BID) | ORAL | 2 refills | Status: DC
  Filled 2021-12-20: qty 14, 7d supply, fill #0
  Filled 2021-12-29: qty 14, 7d supply, fill #1

## 2021-12-20 NOTE — Therapy (Signed)
OUTPATIENT PHYSICAL THERAPY BREAST CANCER TREATMENT   Patient Name: Adrienne Harris MRN: [id number] DOB:05-03-1975, 46 y.o., female Today's Date: 12/20/2021   PT End of Session - 12/20/21 0906     Visit Number 6    Number of Visits 11    Date for PT Re-Evaluation 01/06/22    PT Start Time 0905    PT Stop Time 1000    PT Time Calculation (min) 55 min    Activity Tolerance Patient tolerated treatment well    Behavior During Therapy [facility] for tasks assessed/performed              Past Medical History:  Diagnosis Date   Atopic dermatitis    Basal cell carcinoma    15 years ago.   Dysrhythmia    Hemochromatosis    Heterozygous for two genes   Palpitations    a. 04/2013 48h Holter: sinus rhythm/sinus arrhythmia, rare PAC's/PVC's.   PVC (premature ventricular contraction)    Shoulder pain, left 01/24/2015   Shoulder pain, right 01/24/2015   Past Surgical History:  Procedure Laterality Date   BREAST BIOPSY Right 09/25/2021   BREAST LUMPECTOMY WITH RADIOACTIVE SEED AND SENTINEL LYMPH NODE BIOPSY Right 10/28/2021   Procedure: RIGHT BREAST LUMPECTOMY WITH RADIOACTIVE SEED AND AXILLARY SENTINEL LYMPH NODE BIOPSY;  Surgeon: Rolm Bookbinder, MD;  Location: [place];  Service: General;  Laterality: Right;   COLONOSCOPY WITH PROPOFOL N/A 01/24/2021   Procedure: COLONOSCOPY WITH PROPOFOL;  Surgeon: [name], MD;  Location: [facility];  Service: Endoscopy;  Laterality: N/A;   Excision of basal cell carcinoma     PORTACATH PLACEMENT Left 11/19/2021   Procedure: INSERTION PORT-A-CATH;  Surgeon: Rolm Bookbinder, MD;  Location: [place];  Service: General;  Laterality: Left;   WISDOM TOOTH EXTRACTION     Patient Active Problem List   Diagnosis Date Noted   Genetic testing 10/24/2021   Malignant neoplasm of lower-inner quadrant of right breast of female, estrogen receptor positive ([place]) 10/01/2021   Colon cancer screening    Intractable  menstrual migraine without status migrainosus 07/30/2020   Nonallopathic lesion of sacral region 12/30/2017   Trigger point of left shoulder region 10/06/2017   Slipped rib syndrome 08/05/2017   Nonallopathic lesion of rib cage 08/05/2017   Nonallopathic lesion of cervical region 08/05/2017   Nonallopathic lesion of thoracic region 08/05/2017   Nonallopathic lesion of lumbosacral region 08/05/2017   Chronic left shoulder pain 07/10/2017   Chronic LLQ pain 05/17/2017   Atopic dermatitis 07/10/2016   Hepatic cyst 07/10/2016   Routine physical examination 07/10/2016   PVC's (premature ventricular contractions) 04/22/2013   Hemochromatosis 04/22/2013    REFERRING PROVIDER: Dr. Rolm Bookbinder  REFERRING DIAG: Right breast cancer  THERAPY DIAG:  Malignant neoplasm of lower-inner quadrant of right breast of female, estrogen receptor positive ([place])  Abnormal posture  Aftercare following surgery for neoplasm  Rationale for Evaluation and Treatment Rehabilitation  ONSET DATE: 10/28/2021  SUBJECTIVE:  SUBJECTIVE STATEMENT: My Rt shoulder is feeling good but I've started developing a rash at varying areas of my body. The problem is that I'm allergic to most topical creams so I took benadryl last night and again this morning and I'm using a different cream right now but I can't tell if it's working yet or not.   PERTINENT HISTORY:  Patient was diagnosed on 09/25/2021 with right grade 3 invasive mammary carcinoma of no particular type. She underwent a right lumpectomy and sentinel node biopsy on 09/28/2021. It is ER/PR positive and HER2 negative and the Ki67 was not measured.   PATIENT GOALS:  Reassess how my recovery is going related to arm function, pain, and swelling.  PAIN:  Are you having pain? No,  just still feel a pull at my end reach now.   PRECAUTIONS: Recent Surgery, right UE Lymphedema risk, Other: Currently undergoing chemotherapy  ACTIVITY LEVEL / LEISURE: She has not returned to exercise   OBJECTIVE:   PATIENT SURVEYS:  QUICK DASH:     OBSERVATIONS:  Right axillary and breast incisions are healing well. Some scabs still present in right nipple region but axillary incision is completely healed. No edema, redness, or axillary cording noted.  POSTURE:  Forward head, rounded shoulders  LYMPHEDEMA ASSESSMENT:   UPPER EXTREMITY AROM/PROM:   A/PROM RIGHT   eval   RIGHT 11/21/2021 RIGHT 12/09/21  Shoulder extension 50 47   Shoulder flexion 137 128 142  Shoulder abduction 166 135 121  Shoulder internal rotation 66 67   Shoulder external rotation 82 86                           (Blank rows = not tested)   A/PROM LEFT   eval  Shoulder extension 41  Shoulder flexion 144  Shoulder abduction 153  Shoulder internal rotation 66  Shoulder external rotation 90                          (Blank rows = not tested)      UPPER EXTREMITY STRENGTH: WNL     LYMPHEDEMA ASSESSMENTS:    LANDMARK RIGHT   eval RIGHT 11/21/2021  10 cm proximal to olecranon process 25.3 25.7  Olecranon process 23.6 23.7  10 cm proximal to ulnar styloid process 19.8 19.2  Just proximal to ulnar styloid process 14.8 14.8  Across hand at thumb web space 17.8 18  At base of 2nd digit 5.7 5.6  (Blank rows = not tested)   LANDMARK LEFT   eval LEFT 11/21/2021  10 cm proximal to olecranon process 26.5 26.1  Olecranon process 23.5 23.6  10 cm proximal to ulnar styloid process 19.4 19.3  Just proximal to ulnar styloid process 15.3 15.2  Across hand at thumb web space 17.9 18.3  At base of 2nd digit 5.5 5.6  (Blank rows = not tested)         Surgery type/Date: Right lumpectomy and sentinel node biopsy 10/28/2021 Number of lymph nodes removed: 5 Current/past treatment (chemo,  radiation, hormone therapy): Currently undergoing chemotherapy Other symptoms:  Heaviness/tightness Yes Pain Yes Pitting edema No Infections No Decreased scar mobility Yes Stemmer sign No  TODAY'S TREATMENT 12/20/21 Therapeutic Exercise: Pulleys flexion and abduction 2 min each with Vcs reminder to decrease Rt scapular compensation  Ball roll up flexion and abduction 1x10 each Modified downward dog on wall 5x, 5 sec holds  Supine scapular series with  yellow theraband x5 each returning therapist demo for each with no pain or discomfort reported during Manual Therapy MFR to Rt axilla and upper arm at area of cording, some softening of this noted today P/ROM in supine to Rt shoulder into flexion, abduction and D2 to pts available end motions and with scapular depression applied throughout STM superior to incision where scar tissue palpable, also in axilla over scar tissue noted here at base of cording Scap Mobs in Lt S/L to Rt scapula into protraction and retraction  12/16/21 Therapeutic Exercise: Pulleys flexion and abduction 2 min  each returning therapist demo Ball roll up flexion and abduction 1x10 each Modified downward dog on wall 5x, 5 sec holds  Manual Therapy MFR to Rt axilla and upper arm at area of cording P/ROM in supine to Rt shoulder into flexion, abduction and D2 to pts available end motions and with scapular depression applied throughout STM superior to incision where scar tissue palpable, also in axilla over scar tissue noted here at base of cording Scap Mobs in Lt S/L to Rt scapula into protraction and retraction and prolonged holds pulling scapula away from rib cage  12/09/21 Manual Therapy MFR to Rt axilla and upper arm at area of cording P/ROM in supine to Rt shoulder into flexion, abduction and D2 to pts available end motions STM superior to incision where scar tissue palpable, also in axilla over scar tissue noted here at base of cording Scap Mobs in Lt S/L to  Rt scapula into protraction and retraction Exercise: Pulleys flexion and abduction 2 min  Ball roll up flexion and abduction 1x10 Sidelying shoulder abduction        PATIENT EDUCATION:  Education details: Closed chain flexion and abduction shoulder exercises; Supine scapular series with yellow theraband - 12/20/21 Person educated: Patient and Spouse Education method: Explanation, Demonstration, and Handouts Education comprehension: verbalized understanding and returned demonstration, will benefit from further review   HOME EXERCISE PROGRAM:  Reviewed previously given post op HEP. Added closed chain flexion and abduction exercises for right shoulder.  ASSESSMENT:  CLINICAL IMPRESSION: Continued with AA/ROM and progressed her HEP to include postural strengthening using yellow theraband for supine scapular series. Then continued with manual therapy working on decreasing her Rt upper quadrant tightness which is improving as her end P/ROM has increased.   Pt will benefit from skilled therapeutic intervention to improve on the following deficits: Decreased knowledge of precautions, impaired UE functional use, pain, decreased ROM, postural dysfunction, manual therapy, manual lymph drainage, PROM, joint mobilizations, and dry needling.   PT treatment/interventions: ADL/Self care home management, Therapeutic exercises, Therapeutic activity, Patient/Family education, Self Care, Manual therapy, and Re-evaluation     GOALS: Goals reviewed with patient? Yes  LONG TERM GOALS:  (STG=LTG)  GOALS Name Target Date  Goal status  1 Pt will demonstrate she has regained full shoulder ROM and function post operatively compared to baselines.  Baseline: 01/06/2022 IN PROGRESS  2 Patient reports >/= 50% less tightness and discomfort at end ROM with right shoulder flexion and abduction to tolerate reaching. 01/06/2022 INITIAL  3 Patient will demonstrate no visible sign of axillary cording for  improved overall ROM. 01/06/2022 INITIAL     PLAN: PT FREQUENCY/DURATION: 2x/wk, x4 weeks  PLAN FOR NEXT SESSION:  Review supine scapular series to HEP; Manual therapy techniques to decrease Rt axillary cording and improve her Rt shoulder A/ROM. Cont pulleys, ball Eppie Gibson, PTA 12/20/21 10:04 AM   Over Head Pull: Narrow and Wide  Grip   Cancer Rehab [phone]   On back, knees bent, feet flat, band across thighs, elbows straight but relaxed. Pull hands apart (start). Keeping elbows straight, bring arms up and over head, hands toward floor. Keep pull steady on band. Hold momentarily. Return slowly, keeping pull steady, back to start. Then do same with a wider grip on the band (past shoulder width) Repeat _5-10__ times. Band color __yellow____   Side Pull: Double Arm   On back, knees bent, feet flat. Arms perpendicular to body, shoulder level, elbows straight but relaxed. Pull arms out to sides, elbows straight. Resistance band comes across collarbones, hands toward floor. Hold momentarily. Slowly return to starting position. Repeat _5-10__ times. Band color _yellow____   Sword   On back, knees bent, feet flat, left hand on left hip, right hand above left. Pull right arm DIAGONALLY (hip to shoulder) across chest. Bring right arm along head toward floor. Hold momentarily. Slowly return to starting position. Repeat _5-10__ times. Do with left arm. Band color _yellow_____   Shoulder Rotation: Double Arm   On back, knees bent, feet flat, elbows tucked at sides, bent 90, hands palms up. Pull hands apart and down toward floor, keeping elbows near sides. Hold momentarily. Slowly return to starting position. Repeat _5-10__ times. Band color __yellow____

## 2021-12-20 NOTE — Patient Instructions (Signed)
Over Head Pull: Narrow and Wide Grip   Cancer Rehab [phone]   On back, knees bent, feet flat, band across thighs, elbows straight but relaxed. Pull hands apart (start). Keeping elbows straight, bring arms up and over head, hands toward floor. Keep pull steady on band. Hold momentarily. Return slowly, keeping pull steady, back to start. Then do same with a wider grip on the band (past shoulder width) Repeat _5-10__ times. Band color __yellow____   Side Pull: Double Arm   On back, knees bent, feet flat. Arms perpendicular to body, shoulder level, elbows straight but relaxed. Pull arms out to sides, elbows straight. Resistance band comes across collarbones, hands toward floor. Hold momentarily. Slowly return to starting position. Repeat _5-10__ times. Band color _yellow____   Sword   On back, knees bent, feet flat, left hand on left hip, right hand above left. Pull right arm DIAGONALLY (hip to shoulder) across chest. Bring right arm along head toward floor. Hold momentarily. Slowly return to starting position. Repeat _5-10__ times. Do with left arm. Band color _yellow_____   Shoulder Rotation: Double Arm   On back, knees bent, feet flat, elbows tucked at sides, bent 90, hands palms up. Pull hands apart and down toward floor, keeping elbows near sides. Hold momentarily. Slowly return to starting position. Repeat _5-10__ times. Band color __yellow____    

## 2021-12-23 ENCOUNTER — Encounter: Payer: Self-pay | Admitting: Rehabilitation

## 2021-12-23 ENCOUNTER — Ambulatory Visit: Payer: 59 | Admitting: Rehabilitation

## 2021-12-23 DIAGNOSIS — C50311 Malignant neoplasm of lower-inner quadrant of right female breast: Secondary | ICD-10-CM | POA: Diagnosis not present

## 2021-12-23 DIAGNOSIS — Z483 Aftercare following surgery for neoplasm: Secondary | ICD-10-CM | POA: Diagnosis not present

## 2021-12-23 DIAGNOSIS — R293 Abnormal posture: Secondary | ICD-10-CM

## 2021-12-23 DIAGNOSIS — Z170 Estrogen receptor positive status [ER+]: Secondary | ICD-10-CM | POA: Diagnosis not present

## 2021-12-23 NOTE — Therapy (Signed)
OUTPATIENT PHYSICAL THERAPY BREAST CANCER TREATMENT   Patient Name: Adrienne Harris MRN: [id number] DOB:1975-08-13, 46 y.o., female Today's Date: 12/23/2021   PT End of Session - 12/23/21 0853     Visit Number 7    Number of Visits 11    Date for PT Re-Evaluation 01/06/22    PT Start Time 0900    Activity Tolerance Patient tolerated treatment well    Behavior During Therapy [facility] for tasks assessed/performed              Past Medical History:  Diagnosis Date   Atopic dermatitis    Basal cell carcinoma    15 years ago.   Dysrhythmia    Hemochromatosis    Heterozygous for two genes   Palpitations    a. 04/2013 48h Holter: sinus rhythm/sinus arrhythmia, rare PAC's/PVC's.   PVC (premature ventricular contraction)    Shoulder pain, left 01/24/2015   Shoulder pain, right 01/24/2015   Past Surgical History:  Procedure Laterality Date   BREAST BIOPSY Right 09/25/2021   BREAST LUMPECTOMY WITH RADIOACTIVE SEED AND SENTINEL LYMPH NODE BIOPSY Right 10/28/2021   Procedure: RIGHT BREAST LUMPECTOMY WITH RADIOACTIVE SEED AND AXILLARY SENTINEL LYMPH NODE BIOPSY;  Surgeon: Rolm Bookbinder, MD;  Location: [place];  Service: General;  Laterality: Right;   COLONOSCOPY WITH PROPOFOL N/A 01/24/2021   Procedure: COLONOSCOPY WITH PROPOFOL;  Surgeon: [name], MD;  Location: [facility];  Service: Endoscopy;  Laterality: N/A;   Excision of basal cell carcinoma     PORTACATH PLACEMENT Left 11/19/2021   Procedure: INSERTION PORT-A-CATH;  Surgeon: Rolm Bookbinder, MD;  Location: [place];  Service: General;  Laterality: Left;   WISDOM TOOTH EXTRACTION     Patient Active Problem List   Diagnosis Date Noted   Genetic testing 10/24/2021   Malignant neoplasm of lower-inner quadrant of right breast of female, estrogen receptor positive ([place]) 10/01/2021   Colon cancer screening    Intractable menstrual migraine without status migrainosus 07/30/2020    Nonallopathic lesion of sacral region 12/30/2017   Trigger point of left shoulder region 10/06/2017   Slipped rib syndrome 08/05/2017   Nonallopathic lesion of rib cage 08/05/2017   Nonallopathic lesion of cervical region 08/05/2017   Nonallopathic lesion of thoracic region 08/05/2017   Nonallopathic lesion of lumbosacral region 08/05/2017   Chronic left shoulder pain 07/10/2017   Chronic LLQ pain 05/17/2017   Atopic dermatitis 07/10/2016   Hepatic cyst 07/10/2016   Routine physical examination 07/10/2016   PVC's (premature ventricular contractions) 04/22/2013   Hemochromatosis 04/22/2013    REFERRING PROVIDER: Dr. Rolm Bookbinder  REFERRING DIAG: Right breast cancer  THERAPY DIAG:  Malignant neoplasm of lower-inner quadrant of right breast of female, estrogen receptor positive [facility])  Aftercare following surgery for neoplasm  Abnormal posture  Rationale for Evaluation and Treatment Rehabilitation  ONSET DATE: 10/28/2021  SUBJECTIVE:  SUBJECTIVE STATEMENT:   PERTINENT HISTORY:  Patient was diagnosed on 09/25/2021 with right grade 3 invasive mammary carcinoma of no particular type. She underwent a right lumpectomy and sentinel node biopsy on 09/28/2021. It is ER/PR positive and HER2 negative and the Ki67 was not measured.   PATIENT GOALS:  Reassess how my recovery is going related to arm function, pain, and swelling.  PAIN:  Are you having pain? No, just still feel a pull at my end reach now.   PRECAUTIONS: Recent Surgery, right UE Lymphedema risk, Other: Currently undergoing chemotherapy  ACTIVITY LEVEL / LEISURE: She has not returned to exercise   OBJECTIVE:  OBSERVATIONS:  Right axillary and breast incisions are healing well. Some scabs still present in right nipple region but  axillary incision is completely healed. No edema, redness, or axillary cording noted.  POSTURE:  Forward head, rounded shoulders  LYMPHEDEMA ASSESSMENT:   UPPER EXTREMITY AROM/PROM:   A/PROM RIGHT   eval   RIGHT 11/21/2021 RIGHT 12/09/21 12/23/21  Shoulder extension 50 47    Shoulder flexion 137 128 142 148  Shoulder abduction 166 135 121 155 - just tight and a bit sore  Shoulder internal rotation 66 67    Shoulder external rotation 82 86                            (Blank rows = not tested)   A/PROM LEFT   eval  Shoulder extension 41  Shoulder flexion 144  Shoulder abduction 153  Shoulder internal rotation 66  Shoulder external rotation 90                          (Blank rows = not tested)   LYMPHEDEMA ASSESSMENTS:    LANDMARK RIGHT   eval RIGHT 11/21/2021  10 cm proximal to olecranon process 25.3 25.7  Olecranon process 23.6 23.7  10 cm proximal to ulnar styloid process 19.8 19.2  Just proximal to ulnar styloid process 14.8 14.8  Across hand at thumb web space 17.8 18  At base of 2nd digit 5.7 5.6  (Blank rows = not tested)   LANDMARK LEFT   eval LEFT 11/21/2021  10 cm proximal to olecranon process 26.5 26.1  Olecranon process 23.5 23.6  10 cm proximal to ulnar styloid process 19.4 19.3  Just proximal to ulnar styloid process 15.3 15.2  Across hand at thumb web space 17.9 18.3  At base of 2nd digit 5.5 5.6  (Blank rows = not tested)    Surgery type/Date: Right lumpectomy and sentinel node biopsy 10/28/2021 Number of lymph nodes removed: 5 Current/past treatment (chemo, radiation, hormone therapy): Currently undergoing chemotherapy Other symptoms:  Heaviness/tightness Yes Pain Yes Pitting edema No Infections No Decreased scar mobility Yes Stemmer sign No  TODAY'S TREATMENT 12/23/21 Therapeutic Exercise: Pulleys flexion and abduction 2 min Ball roll up flexion and abduction 1x10 each Modified downward dog on ball 5x, 5 sec holds (easy) Supine  scapular series with yellow theraband x10 each returning therapist demo for each with no pain or discomfort reported during Manual Therapy MFR to Rt axilla and upper arm at area of cording P/ROM in supine to Rt shoulder into flexion, abduction and D2 to pts available end motions and with scapular depression applied throughout STM superior to incision where scar tissue palpable, also in axilla over scar tissue noted here at base of cording Scap Mobs in Lt S/L to Rt scapula   into protraction and retraction  12/20/21 Therapeutic Exercise: Pulleys flexion and abduction 2 min each with Vcs reminder to decrease Rt scapular compensation  Ball roll up flexion and abduction 1x10 each Modified downward dog on wall 5x, 5 sec holds  Supine scapular series with yellow theraband x5 each returning therapist demo for each with no pain or discomfort reported during Manual Therapy MFR to Rt axilla and upper arm at area of cording, some softening of this noted today P/ROM in supine to Rt shoulder into flexion, abduction and D2 to pts available end motions and with scapular depression applied throughout STM superior to incision where scar tissue palpable, also in axilla over scar tissue noted here at base of cording Scap Mobs in Lt S/L to Rt scapula into protraction and retraction  12/16/21 Therapeutic Exercise: Pulleys flexion and abduction 2 min  each returning therapist demo Ball roll up flexion and abduction 1x10 each Modified downward dog on wall 5x, 5 sec holds  Manual Therapy MFR to Rt axilla and upper arm at area of cording P/ROM in supine to Rt shoulder into flexion, abduction and D2 to pts available end motions and with scapular depression applied throughout STM superior to incision where scar tissue palpable, also in axilla over scar tissue noted here at base of cording Scap Mobs in Lt S/L to Rt scapula into protraction and retraction and prolonged holds pulling scapula away from rib cage  PATIENT  EDUCATION:  Education details: Closed chain flexion and abduction shoulder exercises; Supine scapular series with yellow theraband - 12/20/21 Person educated: Patient and Spouse Education method: Explanation, Demonstration, and Handouts Education comprehension: verbalized understanding and returned demonstration, will benefit from further review  HOME EXERCISE PROGRAM:  Reviewed previously given post op HEP. Added closed chain flexion and abduction exercises for right shoulder.  ASSESSMENT: CLINICAL IMPRESSION: Continued with AA/ROM and TE to include postural strengthening using yellow theraband for supine scapular series. Then continued with manual therapy working on decreasing her Rt upper quadrant tightness which is improving as her end P/ROM has increased.   Pt will benefit from skilled therapeutic intervention to improve on the following deficits: Decreased knowledge of precautions, impaired UE functional use, pain, decreased ROM, postural dysfunction, manual therapy, manual lymph drainage, PROM, joint mobilizations, and dry needling.   PT treatment/interventions: ADL/Self care home management, Therapeutic exercises, Therapeutic activity, Patient/Family education, Self Care, Manual therapy, and Re-evaluation  GOALS: Goals reviewed with patient? Yes  LONG TERM GOALS:  (STG=LTG)  GOALS Name Target Date  Goal status  1 Pt will demonstrate she has regained full shoulder ROM and function post operatively compared to baselines.  Baseline: 01/06/2022 IN PROGRESS  2 Patient reports >/= 50% less tightness and discomfort at end ROM with right shoulder flexion and abduction to tolerate reaching. 01/06/2022 INITIAL  3 Patient will demonstrate no visible sign of axillary cording for improved overall ROM. 01/06/2022 INITIAL     PLAN: PT FREQUENCY/DURATION: 2x/wk, x4 weeks  PLAN FOR NEXT SESSION:  Review supine scapular series to HEP; Manual therapy techniques to decrease Rt axillary cording  and improve her Rt shoulder A/ROM. Cont pulleys, ball Bishop [place], PT    12/23/21 8:53 AM   Over Head Pull: Narrow and Wide Grip   Cancer Rehab [phone]   On back, knees bent, feet flat, band across thighs, elbows straight but relaxed. Pull hands apart (start). Keeping elbows straight, bring arms up and over head, hands toward floor. Keep pull steady on band. Hold momentarily. Return  slowly, keeping pull steady, back to start. Then do same with a wider grip on the band (past shoulder width) Repeat _5-10__ times. Band color __yellow____   Side Pull: Double Arm   On back, knees bent, feet flat. Arms perpendicular to body, shoulder level, elbows straight but relaxed. Pull arms out to sides, elbows straight. Resistance band comes across collarbones, hands toward floor. Hold momentarily. Slowly return to starting position. Repeat _5-10__ times. Band color _yellow____   Sword   On back, knees bent, feet flat, left hand on left hip, right hand above left. Pull right arm DIAGONALLY (hip to shoulder) across chest. Bring right arm along head toward floor. Hold momentarily. Slowly return to starting position. Repeat _5-10__ times. Do with left arm. Band color _yellow_____   Shoulder Rotation: Double Arm   On back, knees bent, feet flat, elbows tucked at sides, bent 90, hands palms up. Pull hands apart and down toward floor, keeping elbows near sides. Hold momentarily. Slowly return to starting position. Repeat _5-10__ times. Band color __yellow____  

## 2021-12-26 ENCOUNTER — Ambulatory Visit: Payer: 59 | Admitting: Rehabilitation

## 2021-12-26 ENCOUNTER — Encounter: Payer: Self-pay | Admitting: Rehabilitation

## 2021-12-26 DIAGNOSIS — Z483 Aftercare following surgery for neoplasm: Secondary | ICD-10-CM | POA: Diagnosis not present

## 2021-12-26 DIAGNOSIS — C50311 Malignant neoplasm of lower-inner quadrant of right female breast: Secondary | ICD-10-CM

## 2021-12-26 DIAGNOSIS — R293 Abnormal posture: Secondary | ICD-10-CM

## 2021-12-26 DIAGNOSIS — Z170 Estrogen receptor positive status [ER+]: Secondary | ICD-10-CM | POA: Diagnosis not present

## 2021-12-26 NOTE — Therapy (Signed)
OUTPATIENT PHYSICAL THERAPY BREAST CANCER TREATMENT   Patient Name: LORISSA KISHBAUGH MRN: [id number] DOB:11/29/1975, 46 y.o., female Today's Date: 12/26/2021   PT End of Session - 12/26/21 1502     Visit Number 8    Number of Visits 11    Date for PT Re-Evaluation 01/06/22    PT Start Time 1500    PT Stop Time 7782    PT Time Calculation (min) 48 min    Activity Tolerance Patient tolerated treatment well    Behavior During Therapy WFL for tasks assessed/performed               Past Medical History:  Diagnosis Date   Atopic dermatitis    Basal cell carcinoma    15 years ago.   Dysrhythmia    Hemochromatosis    Heterozygous for two genes   Palpitations    a. 04/2013 48h Holter: sinus rhythm/sinus arrhythmia, rare PAC's/PVC's.   PVC (premature ventricular contraction)    Shoulder pain, left 01/24/2015   Shoulder pain, right 01/24/2015   Past Surgical History:  Procedure Laterality Date   BREAST BIOPSY Right 09/25/2021   BREAST LUMPECTOMY WITH RADIOACTIVE SEED AND SENTINEL LYMPH NODE BIOPSY Right 10/28/2021   Procedure: RIGHT BREAST LUMPECTOMY WITH RADIOACTIVE SEED AND AXILLARY SENTINEL LYMPH NODE BIOPSY;  Surgeon: Rolm Bookbinder, MD;  Location: [facility];  Service: General;  Laterality: Right;   COLONOSCOPY WITH PROPOFOL N/A 01/24/2021   Procedure: COLONOSCOPY WITH PROPOFOL;  Surgeon: [name], MD;  Location: [facility];  Service: Endoscopy;  Laterality: N/A;   Excision of basal cell carcinoma     PORTACATH PLACEMENT Left 11/19/2021   Procedure: INSERTION PORT-A-CATH;  Surgeon: Rolm Bookbinder, MD;  Location: [facility];  Service: General;  Laterality: Left;   WISDOM TOOTH EXTRACTION     Patient Active Problem List   Diagnosis Date Noted   Genetic testing 10/24/2021   Malignant neoplasm of lower-inner quadrant of right breast of female, estrogen receptor positive ([place]) 10/01/2021   Colon cancer screening    Intractable  menstrual migraine without status migrainosus 07/30/2020   Nonallopathic lesion of sacral region 12/30/2017   Trigger point of left shoulder region 10/06/2017   Slipped rib syndrome 08/05/2017   Nonallopathic lesion of rib cage 08/05/2017   Nonallopathic lesion of cervical region 08/05/2017   Nonallopathic lesion of thoracic region 08/05/2017   Nonallopathic lesion of lumbosacral region 08/05/2017   Chronic left shoulder pain 07/10/2017   Chronic LLQ pain 05/17/2017   Atopic dermatitis 07/10/2016   Hepatic cyst 07/10/2016   Routine physical examination 07/10/2016   PVC's (premature ventricular contractions) 04/22/2013   Hemochromatosis 04/22/2013    REFERRING PROVIDER: Dr. Rolm Bookbinder  REFERRING DIAG: Right breast cancer  THERAPY DIAG:  Malignant neoplasm of lower-inner quadrant of right breast of female, estrogen receptor positive ([place])  Abnormal posture  Aftercare following surgery for neoplasm  Rationale for Evaluation and Treatment Rehabilitation  ONSET DATE: 10/28/2021  SUBJECTIVE:  SUBJECTIVE STATEMENT: Under the armpit she feels like she notice a little extra swelling. She has started back at work and that is going well   PERTINENT HISTORY:  Patient was diagnosed on 09/25/2021 with right grade 3 invasive mammary carcinoma of no particular type. She underwent a right lumpectomy and sentinel node biopsy on 09/28/2021. It is ER/PR positive and HER2 negative and the Ki67 was not measured.   PATIENT GOALS:  Reassess how my recovery is going related to arm function, pain, and swelling.  PAIN:  Are you having pain? No, just still feel a pull at my end reach now.   PRECAUTIONS: Recent Surgery, right UE Lymphedema risk, Other: Currently undergoing chemotherapy  ACTIVITY LEVEL / LEISURE:  She has not returned to exercise   OBJECTIVE:  OBSERVATIONS:  Right axillary and breast incisions are healing well. Some scabs still present in right nipple region but axillary incision is completely healed. No edema, redness, or axillary cording noted.  POSTURE:  Forward head, rounded shoulders  LYMPHEDEMA ASSESSMENT:   UPPER EXTREMITY AROM/PROM:   A/PROM RIGHT   eval   RIGHT 11/21/2021 RIGHT 12/09/21 12/23/21  Shoulder extension 50 47    Shoulder flexion 137 128 142 148  Shoulder abduction 166 135 121 155 - just tight and a bit sore  Shoulder internal rotation 66 67    Shoulder external rotation 82 86                            (Blank rows = not tested)   A/PROM LEFT   eval  Shoulder extension 41  Shoulder flexion 144  Shoulder abduction 153  Shoulder internal rotation 66  Shoulder external rotation 90                          (Blank rows = not tested)   LYMPHEDEMA ASSESSMENTS:    LANDMARK RIGHT   eval RIGHT 11/21/2021  10 cm proximal to olecranon process 25.3 25.7  Olecranon process 23.6 23.7  10 cm proximal to ulnar styloid process 19.8 19.2  Just proximal to ulnar styloid process 14.8 14.8  Across hand at thumb web space 17.8 18  At base of 2nd digit 5.7 5.6  (Blank rows = not tested)   LANDMARK LEFT   eval LEFT 11/21/2021  10 cm proximal to olecranon process 26.5 26.1  Olecranon process 23.5 23.6  10 cm proximal to ulnar styloid process 19.4 19.3  Just proximal to ulnar styloid process 15.3 15.2  Across hand at thumb web space 17.9 18.3  At base of 2nd digit 5.5 5.6  (Blank rows = not tested)    Surgery type/Date: Right lumpectomy and sentinel node biopsy 10/28/2021 Number of lymph nodes removed: 5 Current/past treatment (chemo, radiation, hormone therapy): Currently undergoing chemotherapy Other symptoms:  Heaviness/tightness Yes Pain Yes Pitting edema No Infections No Decreased scar mobility Yes Stemmer sign No  TODAY'S  TREATMENT  12/26/21 Therapeutic Exercise: Pulleys flexion and abduction 2 min Ball roll up flexion and abduction 1x10 each Modified downward dog on ball 5x, 5 sec holds (easy) 3 way side to side and forward  Supine scapular series with yellow theraband x10 each returning therapist demo for each with no pain or discomfort reported during Manual Therapy MFR to Rt axilla and upper arm at area of cording P/ROM in supine to Rt shoulder into flexion, abduction and D2 to pts available end motions and with  scapular depression applied throughout STM superior to incision where scar tissue palpable, also in axilla over scar tissue noted here at base of cording Scap Mobs in Lt S/L to Rt scapula into protraction and retraction  12/23/21 Therapeutic Exercise: Pulleys flexion and abduction 2 min Ball roll up flexion and abduction 1x10 each Modified downward dog on ball 5x, 5 sec holds (easy) Supine scapular series with yellow theraband x10 each returning therapist demo for each with no pain or discomfort reported during Manual Therapy MFR to Rt axilla and upper arm at area of cording P/ROM in supine to Rt shoulder into flexion, abduction and D2 to pts available end motions and with scapular depression applied throughout STM superior to incision where scar tissue palpable, also in axilla over scar tissue noted here at base of cording Scap Mobs in Lt S/L to Rt scapula into protraction and retraction  12/20/21 Therapeutic Exercise: Pulleys flexion and abduction 2 min each with Vcs reminder to decrease Rt scapular compensation  Ball roll up flexion and abduction 1x10 each Modified downward dog on wall 5x, 5 sec holds  Supine scapular series with yellow theraband x5 each returning therapist demo for each with no pain or discomfort reported during Manual Therapy MFR to Rt axilla and upper arm at area of cording, some softening of this noted today P/ROM in supine to Rt shoulder into flexion, abduction  and D2 to pts available end motions and with scapular depression applied throughout STM superior to incision where scar tissue palpable, also in axilla over scar tissue noted here at base of cording Scap Mobs in Lt S/L to Rt scapula into protraction and retraction  12/16/21 Therapeutic Exercise: Pulleys flexion and abduction 2 min  each returning therapist demo Ball roll up flexion and abduction 1x10 each Modified downward dog on wall 5x, 5 sec holds  Manual Therapy MFR to Rt axilla and upper arm at area of cording P/ROM in supine to Rt shoulder into flexion, abduction and D2 to pts available end motions and with scapular depression applied throughout STM superior to incision where scar tissue palpable, also in axilla over scar tissue noted here at base of cording Scap Mobs in Lt S/L to Rt scapula into protraction and retraction and prolonged holds pulling scapula away from rib cage  PATIENT EDUCATION:  Education details: Closed chain flexion and abduction shoulder exercises; Supine scapular series with yellow theraband - 12/20/21 Person educated: Patient and Spouse Education method: Explanation, Demonstration, and Handouts Education comprehension: verbalized understanding and returned demonstration, will benefit from further review  HOME EXERCISE PROGRAM:  Reviewed previously given post op HEP. Added closed chain flexion and abduction exercises for right shoulder.  ASSESSMENT: CLINICAL IMPRESSION: Continued with AA/ROM and TE to include postural strengthening using yellow theraband for supine scapular series and patient responded well. Then continued with manual therapy working on decreasing her Rt upper quadrant tightness which is improving as her end P/ROM has increased. Performed Mld to right axillary due to additional swelling felt by patient.   Pt will benefit from skilled therapeutic intervention to improve on the following deficits: Decreased knowledge of precautions, impaired UE  functional use, pain, decreased ROM, postural dysfunction, manual therapy, manual lymph drainage, PROM, joint mobilizations, and dry needling.   PT treatment/interventions: ADL/Self care home management, Therapeutic exercises, Therapeutic activity, Patient/Family education, Self Care, Manual therapy, and Re-evaluation  GOALS: Goals reviewed with patient? Yes  LONG TERM GOALS:  (STG=LTG)  GOALS Name Target Date  Goal status  1 Pt will  demonstrate she has regained full shoulder ROM and function post operatively compared to baselines.  Baseline: 01/06/2022 IN PROGRESS  2 Patient reports >/= 50% less tightness and discomfort at end ROM with right shoulder flexion and abduction to tolerate reaching. 01/06/2022 INITIAL  3 Patient will demonstrate no visible sign of axillary cording for improved overall ROM. 01/06/2022 INITIAL     PLAN: PT FREQUENCY/DURATION: 2x/wk, x4 weeks  PLAN FOR NEXT SESSION:  Manual therapy techniques to decrease Rt axillary cording and improve her Rt shoulder A/ROM. Cont pulleys, ball Bishop [place], PT    12/26/21 3:53 PM   Over Head Pull: Narrow and Wide Grip   Cancer Rehab [phone]   On back, knees bent, feet flat, band across thighs, elbows straight but relaxed. Pull hands apart (start). Keeping elbows straight, bring arms up and over head, hands toward floor. Keep pull steady on band. Hold momentarily. Return slowly, keeping pull steady, back to start. Then do same with a wider grip on the band (past shoulder width) Repeat _5-10__ times. Band color __yellow____   Side Pull: Double Arm   On back, knees bent, feet flat. Arms perpendicular to body, shoulder level, elbows straight but relaxed. Pull arms out to sides, elbows straight. Resistance band comes across collarbones, hands toward floor. Hold momentarily. Slowly return to starting position. Repeat _5-10__ times. Band color _yellow____   Sword   On back, knees bent, feet flat, left hand on left  hip, right hand above left. Pull right arm DIAGONALLY (hip to shoulder) across chest. Bring right arm along head toward floor. Hold momentarily. Slowly return to starting position. Repeat _5-10__ times. Do with left arm. Band color _yellow_____   Shoulder Rotation: Double Arm   On back, knees bent, feet flat, elbows tucked at sides, bent 90, hands palms up. Pull hands apart and down toward floor, keeping elbows near sides. Hold momentarily. Slowly return to starting position. Repeat _5-10__ times. Band color __yellow____

## 2021-12-30 ENCOUNTER — Other Ambulatory Visit: Payer: Self-pay

## 2021-12-30 ENCOUNTER — Ambulatory Visit: Payer: 59

## 2021-12-30 DIAGNOSIS — Z483 Aftercare following surgery for neoplasm: Secondary | ICD-10-CM

## 2021-12-30 DIAGNOSIS — Z170 Estrogen receptor positive status [ER+]: Secondary | ICD-10-CM

## 2021-12-30 DIAGNOSIS — R293 Abnormal posture: Secondary | ICD-10-CM

## 2021-12-30 DIAGNOSIS — C50311 Malignant neoplasm of lower-inner quadrant of right female breast: Secondary | ICD-10-CM | POA: Diagnosis not present

## 2021-12-30 NOTE — Therapy (Signed)
OUTPATIENT PHYSICAL THERAPY BREAST CANCER TREATMENT   Patient Name: Adrienne Harris MRN: [id number] DOB:Jun 11, 1975, 46 y.o., female Today's Date: 12/30/2021   PT End of Session - 12/30/21 1510     Visit Number 9    Number of Visits 11    Date for PT Re-Evaluation 01/06/22    PT Start Time 1508    PT Stop Time 1602    PT Time Calculation (min) 54 min    Activity Tolerance Patient tolerated treatment well    Behavior During Therapy [facility] for tasks assessed/performed               Past Medical History:  Diagnosis Date   Atopic dermatitis    Basal cell carcinoma    15 years ago.   Dysrhythmia    Hemochromatosis    Heterozygous for two genes   Palpitations    a. 04/2013 48h Holter: sinus rhythm/sinus arrhythmia, rare PAC's/PVC's.   PVC (premature ventricular contraction)    Shoulder pain, left 01/24/2015   Shoulder pain, right 01/24/2015   Past Surgical History:  Procedure Laterality Date   BREAST BIOPSY Right 09/25/2021   BREAST LUMPECTOMY WITH RADIOACTIVE SEED AND SENTINEL LYMPH NODE BIOPSY Right 10/28/2021   Procedure: RIGHT BREAST LUMPECTOMY WITH RADIOACTIVE SEED AND AXILLARY SENTINEL LYMPH NODE BIOPSY;  Surgeon: Rolm Bookbinder, MD;  Location: [facility];  Service: General;  Laterality: Right;   COLONOSCOPY WITH PROPOFOL N/A 01/24/2021   Procedure: COLONOSCOPY WITH PROPOFOL;  Surgeon: [name], MD;  Location: [facility];  Service: Endoscopy;  Laterality: N/A;   Excision of basal cell carcinoma     PORTACATH PLACEMENT Left 11/19/2021   Procedure: INSERTION PORT-A-CATH;  Surgeon: Rolm Bookbinder, MD;  Location: [facility];  Service: General;  Laterality: Left;   WISDOM TOOTH EXTRACTION     Patient Active Problem List   Diagnosis Date Noted   Genetic testing 10/24/2021   Malignant neoplasm of lower-inner quadrant of right breast of female, estrogen receptor positive ([place]) 10/01/2021   Colon cancer screening    Intractable  menstrual migraine without status migrainosus 07/30/2020   Nonallopathic lesion of sacral region 12/30/2017   Trigger point of left shoulder region 10/06/2017   Slipped rib syndrome 08/05/2017   Nonallopathic lesion of rib cage 08/05/2017   Nonallopathic lesion of cervical region 08/05/2017   Nonallopathic lesion of thoracic region 08/05/2017   Nonallopathic lesion of lumbosacral region 08/05/2017   Chronic left shoulder pain 07/10/2017   Chronic LLQ pain 05/17/2017   Atopic dermatitis 07/10/2016   Hepatic cyst 07/10/2016   Routine physical examination 07/10/2016   PVC's (premature ventricular contractions) 04/22/2013   Hemochromatosis 04/22/2013    REFERRING PROVIDER: Dr. Rolm Bookbinder  REFERRING DIAG: Right breast cancer  THERAPY DIAG:  Malignant neoplasm of lower-inner quadrant of right breast of female, estrogen receptor positive ([place])  Abnormal posture  Aftercare following surgery for neoplasm  Rationale for Evaluation and Treatment Rehabilitation  ONSET DATE: 10/28/2021  SUBJECTIVE:  SUBJECTIVE STATEMENT: My armpit swelling did feel better after she worked on it last time but then I did my work around my house and I think I just did too much over the weekend. Now the swelling is back again.   PERTINENT HISTORY:  Patient was diagnosed on 09/25/2021 with right grade 3 invasive mammary carcinoma of no particular type. She underwent a right lumpectomy and sentinel node biopsy on 09/28/2021. It is ER/PR positive and HER2 negative and the Ki67 was not measured.   PATIENT GOALS:  Reassess how my recovery is going related to arm function, pain, and swelling.  PAIN:  Are you having pain? Yes NPRS scale: 3-4/10 Pain location: Rt axilla Pain orientation: Right  PAIN TYPE: aching Pain  description: constant  Aggravating factors: overusing my arm with ADLs Relieving factors: massage at physical therapy    PRECAUTIONS: Recent Surgery, right UE Lymphedema risk, Other: Currently undergoing chemotherapy  ACTIVITY LEVEL / LEISURE: She has not returned to exercise   OBJECTIVE:  OBSERVATIONS:  Right axillary and breast incisions are healing well. Some scabs still present in right nipple region but axillary incision is completely healed. No edema, redness, or axillary cording noted.  POSTURE:  Forward head, rounded shoulders  LYMPHEDEMA ASSESSMENT:   UPPER EXTREMITY AROM/PROM:   A/PROM RIGHT   eval   RIGHT 11/21/2021 RIGHT 12/09/21 12/23/21  Shoulder extension 50 47    Shoulder flexion 137 128 142 148  Shoulder abduction 166 135 121 155 - just tight and a bit sore  Shoulder internal rotation 66 67    Shoulder external rotation 82 86                            (Blank rows = not tested)   A/PROM LEFT   eval  Shoulder extension 41  Shoulder flexion 144  Shoulder abduction 153  Shoulder internal rotation 66  Shoulder external rotation 90                          (Blank rows = not tested)   LYMPHEDEMA ASSESSMENTS:    LANDMARK RIGHT   eval RIGHT 11/21/2021  10 cm proximal to olecranon process 25.3 25.7  Olecranon process 23.6 23.7  10 cm proximal to ulnar styloid process 19.8 19.2  Just proximal to ulnar styloid process 14.8 14.8  Across hand at thumb web space 17.8 18  At base of 2nd digit 5.7 5.6  (Blank rows = not tested)   LANDMARK LEFT   eval LEFT 11/21/2021  10 cm proximal to olecranon process 26.5 26.1  Olecranon process 23.5 23.6  10 cm proximal to ulnar styloid process 19.4 19.3  Just proximal to ulnar styloid process 15.3 15.2  Across hand at thumb web space 17.9 18.3  At base of 2nd digit 5.5 5.6  (Blank rows = not tested)    Surgery type/Date: Right lumpectomy and sentinel node biopsy 10/28/2021 Number of lymph nodes removed:  5 Current/past treatment (chemo, radiation, hormone therapy): Currently undergoing chemotherapy Other symptoms:  Heaviness/tightness Yes Pain Yes Pitting edema No Infections No Decreased scar mobility Yes Stemmer sign No  TODAY'S TREATMENT 12/30/21 Manual Therapy MFR to Rt axilla and upper arm at area of cording P/ROM in supine to Rt shoulder into flexion, abduction and D2 to pts available end motions and with scapular depression applied throughout STM superior to incision where scar tissue palpable, also in axilla over scar  tissue noted here at base of cording Scap Mobs in Lt S/L to Rt scapula into protraction and retraction Measured pt for a [employer] compression sleeve since her cording flares up with ADLs along with scar tissue (questionable small seroma??) at incision. She fits into a size II, regular and size II gauntlet. Will fax pts demographics to [facility] per her request.  Therapeutic Exercise: Pulleys flexion and abduction 2 min Doorway pectoralis stretch 4x, 20 sec holds returning therapist demo Ball roll up flexion and abduction 1x10 each  12/26/21 Therapeutic Exercise: Pulleys flexion and abduction 2 min Ball roll up flexion and abduction 1x10 each Modified downward dog on ball 5x, 5 sec holds (easy) 3 way side to side and forward  Supine scapular series with yellow theraband x10 each returning therapist demo for each with no pain or discomfort reported during Manual Therapy MFR to Rt axilla and upper arm at area of cording P/ROM in supine to Rt shoulder into flexion, abduction and D2 to pts available end motions and with scapular depression applied throughout STM superior to incision where scar tissue palpable, also in axilla over scar tissue noted here at base of cording Scap Mobs in Lt S/L to Rt scapula into protraction and retraction  12/23/21 Therapeutic Exercise: Pulleys flexion and abduction 2 min Ball roll up flexion and abduction 1x10 each Modified  downward dog on ball 5x, 5 sec holds (easy) Supine scapular series with yellow theraband x10 each returning therapist demo for each with no pain or discomfort reported during Manual Therapy MFR to Rt axilla and upper arm at area of cording P/ROM in supine to Rt shoulder into flexion, abduction and D2 to pts available end motions and with scapular depression applied throughout STM superior to incision where scar tissue palpable, also in axilla over scar tissue noted here at base of cording Scap Mobs in Lt S/L to Rt scapula into protraction and retraction  12/20/21 Therapeutic Exercise: Pulleys flexion and abduction 2 min each with Vcs reminder to decrease Rt scapular compensation  Ball roll up flexion and abduction 1x10 each Modified downward dog on wall 5x, 5 sec holds  Supine scapular series with yellow theraband x5 each returning therapist demo for each with no pain or discomfort reported during Manual Therapy MFR to Rt axilla and upper arm at area of cording, some softening of this noted today P/ROM in supine to Rt shoulder into flexion, abduction and D2 to pts available end motions and with scapular depression applied throughout STM superior to incision where scar tissue palpable, also in axilla over scar tissue noted here at base of cording Scap Mobs in Lt S/L to Rt scapula into protraction and retraction  12/16/21 Therapeutic Exercise: Pulleys flexion and abduction 2 min  each returning therapist demo Ball roll up flexion and abduction 1x10 each Modified downward dog on wall 5x, 5 sec holds  Manual Therapy MFR to Rt axilla and upper arm at area of cording P/ROM in supine to Rt shoulder into flexion, abduction and D2 to pts available end motions and with scapular depression applied throughout STM superior to incision where scar tissue palpable, also in axilla over scar tissue noted here at base of cording Scap Mobs in Lt S/L to Rt scapula into protraction and retraction and prolonged  holds pulling scapula away from rib cage  PATIENT EDUCATION:  Education details: Closed chain flexion and abduction shoulder exercises; Supine scapular series with yellow theraband - 12/20/21 Person educated: Patient and Spouse Education method: Explanation, Demonstration,  and Handouts Education comprehension: verbalized understanding and returned demonstration, will benefit from further review  HOME EXERCISE PROGRAM:  Reviewed previously given post op HEP. Added closed chain flexion and abduction exercises for right shoulder.  ASSESSMENT: CLINICAL IMPRESSION: Pt comes in reporting the hardness she has been feeling at her breast incision is worse again today and upon palpation almost feels like a small seroma. Pt reports size seems to come and go depending on her activity level. So measured her for a compression sleeve and gauntlet today and with her permission will fax her demographics to [facility] and if covered will have them order her 2 sleeves and 2 gauntlets size II regular. Continued with manual therapy and firmness softened dramatically by end of session and pt reports it feeling much better. Also continued with AA/ROM exs and added doorway pectoralis stretch which she reported feeling a good stretch with.   Pt will benefit from skilled therapeutic intervention to improve on the following deficits: Decreased knowledge of precautions, impaired UE functional use, pain, decreased ROM, postural dysfunction, manual therapy, manual lymph drainage, PROM, joint mobilizations, and dry needling.   PT treatment/interventions: ADL/Self care home management, Therapeutic exercises, Therapeutic activity, Patient/Family education, Self Care, Manual therapy, and Re-evaluation  GOALS: Goals reviewed with patient? Yes  LONG TERM GOALS:  (STG=LTG)  GOALS Name Target Date  Goal status  1 Pt will demonstrate she has regained full shoulder ROM and function post operatively compared to baselines.  Baseline:  01/06/2022 IN PROGRESS  2 Patient reports >/= 50% less tightness and discomfort at end ROM with right shoulder flexion and abduction to tolerate reaching. 01/06/2022 INITIAL  3 Patient will demonstrate no visible sign of axillary cording for improved overall ROM. 01/06/2022 INITIAL     PLAN: PT FREQUENCY/DURATION: 2x/wk, x4 weeks  PLAN FOR NEXT SESSION:  Hear back from [facility] Manual therapy techniques to decrease Rt axillary cording and improve her Rt shoulder A/ROM. Cont pulleys, ball Eppie Gibson, PTA 12/30/21 4:17 PM    Over Head Pull: Narrow and Wide Grip   Cancer Rehab [phone]   On back, knees bent, feet flat, band across thighs, elbows straight but relaxed. Pull hands apart (start). Keeping elbows straight, bring arms up and over head, hands toward floor. Keep pull steady on band. Hold momentarily. Return slowly, keeping pull steady, back to start. Then do same with a wider grip on the band (past shoulder width) Repeat _5-10__ times. Band color __yellow____   Side Pull: Double Arm   On back, knees bent, feet flat. Arms perpendicular to body, shoulder level, elbows straight but relaxed. Pull arms out to sides, elbows straight. Resistance band comes across collarbones, hands toward floor. Hold momentarily. Slowly return to starting position. Repeat _5-10__ times. Band color _yellow____   Sword   On back, knees bent, feet flat, left hand on left hip, right hand above left. Pull right arm DIAGONALLY (hip to shoulder) across chest. Bring right arm along head toward floor. Hold momentarily. Slowly return to starting position. Repeat _5-10__ times. Do with left arm. Band color _yellow_____   Shoulder Rotation: Double Arm   On back, knees bent, feet flat, elbows tucked at sides, bent 90, hands palms up. Pull hands apart and down toward floor, keeping elbows near sides. Hold momentarily. Slowly return to starting position. Repeat _5-10__ times. Band color  __yellow____

## 2022-01-01 ENCOUNTER — Ambulatory Visit: Payer: 59 | Admitting: Rehabilitation

## 2022-01-01 ENCOUNTER — Encounter: Payer: Self-pay | Admitting: Rehabilitation

## 2022-01-01 DIAGNOSIS — Z170 Estrogen receptor positive status [ER+]: Secondary | ICD-10-CM

## 2022-01-01 DIAGNOSIS — R293 Abnormal posture: Secondary | ICD-10-CM | POA: Diagnosis not present

## 2022-01-01 DIAGNOSIS — C50311 Malignant neoplasm of lower-inner quadrant of right female breast: Secondary | ICD-10-CM | POA: Diagnosis not present

## 2022-01-01 DIAGNOSIS — Z483 Aftercare following surgery for neoplasm: Secondary | ICD-10-CM | POA: Diagnosis not present

## 2022-01-01 NOTE — Therapy (Signed)
OUTPATIENT PHYSICAL THERAPY BREAST CANCER TREATMENT   Patient Name: Adrienne Harris MRN: [id number] DOB:April 19, 1975, 46 y.o., female Today's Date: 01/01/2022   PT End of Session - 01/01/22 0903     Visit Number 10    Number of Visits 11    Date for PT Re-Evaluation 01/06/22    PT Start Time 0900    PT Stop Time 0947    PT Time Calculation (min) 47 min    Activity Tolerance Patient tolerated treatment well    Behavior During Therapy [facility] for tasks assessed/performed                Past Medical History:  Diagnosis Date   Atopic dermatitis    Basal cell carcinoma    15 years ago.   Dysrhythmia    Hemochromatosis    Heterozygous for two genes   Palpitations    a. 04/2013 48h Holter: sinus rhythm/sinus arrhythmia, rare PAC's/PVC's.   PVC (premature ventricular contraction)    Shoulder pain, left 01/24/2015   Shoulder pain, right 01/24/2015   Past Surgical History:  Procedure Laterality Date   BREAST BIOPSY Right 09/25/2021   BREAST LUMPECTOMY WITH RADIOACTIVE SEED AND SENTINEL LYMPH NODE BIOPSY Right 10/28/2021   Procedure: RIGHT BREAST LUMPECTOMY WITH RADIOACTIVE SEED AND AXILLARY SENTINEL LYMPH NODE BIOPSY;  Surgeon: Rolm Bookbinder, MD;  Location: [place];  Service: General;  Laterality: Right;   COLONOSCOPY WITH PROPOFOL N/A 01/24/2021   Procedure: COLONOSCOPY WITH PROPOFOL;  Surgeon: [name], MD;  Location: [facility];  Service: Endoscopy;  Laterality: N/A;   Excision of basal cell carcinoma     PORTACATH PLACEMENT Left 11/19/2021   Procedure: INSERTION PORT-A-CATH;  Surgeon: Rolm Bookbinder, MD;  Location: [place];  Service: General;  Laterality: Left;   WISDOM TOOTH EXTRACTION     Patient Active Problem List   Diagnosis Date Noted   Genetic testing 10/24/2021   Malignant neoplasm of lower-inner quadrant of right breast of female, estrogen receptor positive ([place]) 10/01/2021   Colon cancer screening    Intractable  menstrual migraine without status migrainosus 07/30/2020   Nonallopathic lesion of sacral region 12/30/2017   Trigger point of left shoulder region 10/06/2017   Slipped rib syndrome 08/05/2017   Nonallopathic lesion of rib cage 08/05/2017   Nonallopathic lesion of cervical region 08/05/2017   Nonallopathic lesion of thoracic region 08/05/2017   Nonallopathic lesion of lumbosacral region 08/05/2017   Chronic left shoulder pain 07/10/2017   Chronic LLQ pain 05/17/2017   Atopic dermatitis 07/10/2016   Hepatic cyst 07/10/2016   Routine physical examination 07/10/2016   PVC's (premature ventricular contractions) 04/22/2013   Hemochromatosis 04/22/2013    REFERRING PROVIDER: Dr. Rolm Bookbinder  REFERRING DIAG: Right breast cancer  THERAPY DIAG:  Malignant neoplasm of lower-inner quadrant of right breast of female, estrogen receptor positive [employer] [facility])  Aftercare following surgery for neoplasm  Abnormal posture  Rationale for Evaluation and Treatment Rehabilitation  ONSET DATE: 10/28/2021  SUBJECTIVE:  SUBJECTIVE STATEMENT: She is still having some puffiness and stiffness in the armpit. She reports she has been very active and believes that is the cause. Overall she is happy with her progress   PERTINENT HISTORY:  Patient was diagnosed on 09/25/2021 with right grade 3 invasive mammary carcinoma of no particular type. She underwent a right lumpectomy and sentinel node biopsy on 09/28/2021. It is ER/PR positive and HER2 negative and the Ki67 was not measured.   PATIENT GOALS:  Reassess how my recovery is going related to arm function, pain, and swelling.  PAIN:  Are you having pain? Yes NPRS scale: 3-4/10 Pain location: Rt axilla Pain orientation: Right  PAIN TYPE: aching Pain description: constant   Aggravating factors: overusing my arm with ADLs Relieving factors: massage at physical therapy    PRECAUTIONS: Recent Surgery, right UE Lymphedema risk, Other: Currently undergoing chemotherapy  ACTIVITY LEVEL / LEISURE: She has not returned to exercise   OBJECTIVE:  OBSERVATIONS:  Right axillary and breast incisions are healing well. Some scabs still present in right nipple region but axillary incision is completely healed. No edema, redness, or axillary cording noted.  POSTURE:  Forward head, rounded shoulders  LYMPHEDEMA ASSESSMENT:   UPPER EXTREMITY AROM/PROM:   A/PROM RIGHT   eval   RIGHT 11/21/2021 RIGHT 12/09/21 12/23/21  Shoulder extension 50 47    Shoulder flexion 137 128 142 148  Shoulder abduction 166 135 121 155 - just tight and a bit sore  Shoulder internal rotation 66 67    Shoulder external rotation 82 86                            (Blank rows = not tested)   A/PROM LEFT   eval  Shoulder extension 41  Shoulder flexion 144  Shoulder abduction 153  Shoulder internal rotation 66  Shoulder external rotation 90                          (Blank rows = not tested)   LYMPHEDEMA ASSESSMENTS:    LANDMARK RIGHT   eval RIGHT 11/21/2021  10 cm proximal to olecranon process 25.3 25.7  Olecranon process 23.6 23.7  10 cm proximal to ulnar styloid process 19.8 19.2  Just proximal to ulnar styloid process 14.8 14.8  Across hand at thumb web space 17.8 18  At base of 2nd digit 5.7 5.6  (Blank rows = not tested)   LANDMARK LEFT   eval LEFT 11/21/2021  10 cm proximal to olecranon process 26.5 26.1  Olecranon process 23.5 23.6  10 cm proximal to ulnar styloid process 19.4 19.3  Just proximal to ulnar styloid process 15.3 15.2  Across hand at thumb web space 17.9 18.3  At base of 2nd digit 5.5 5.6  (Blank rows = not tested)    Surgery type/Date: Right lumpectomy and sentinel node biopsy 10/28/2021 Number of lymph nodes removed: 5 Current/past treatment  (chemo, radiation, hormone therapy): Currently undergoing chemotherapy Other symptoms:  Heaviness/tightness Yes Pain Yes Pitting edema No Infections No Decreased scar mobility Yes Stemmer sign No  TODAY'S TREATMENT 01/01/22 Manual Therapy MFR to Rt axilla and upper arm at area of cording P/ROM in supine to Rt shoulder into flexion, abduction and D2 to pts available end motions and with scapular depression applied throughout STM superior to incision where scar tissue palpable, also in axilla over scar tissue noted here at base of cording Scap  Mobs in Lt S/L to Rt scapula into protraction and retraction  Therapeutic Exercise: Pulleys flexion and abduction 2 min Foam roller series; snow angels, pec stretch, shoulder flexion  Modified downward dog on ball 5x, 5 sec holds (easy) 3 way side to side and forward   12/30/21 Manual Therapy MFR to Rt axilla and upper arm at area of cording P/ROM in supine to Rt shoulder into flexion, abduction and D2 to pts available end motions and with scapular depression applied throughout STM superior to incision where scar tissue palpable, also in axilla over scar tissue noted here at base of cording Scap Mobs in Lt S/L to Rt scapula into protraction and retraction Measured pt for a [employer] compression sleeve since her cording flares up with ADLs along with scar tissue (questionable small seroma??) at incision. She fits into a size II, regular and size II gauntlet. Will fax pts demographics to [facility] per her request.  Therapeutic Exercise: Pulleys flexion and abduction 2 min Doorway pectoralis stretch 4x, 20 sec holds returning therapist demo Ball roll up flexion and abduction 1x10 each  12/26/21 Therapeutic Exercise: Pulleys flexion and abduction 2 min Ball roll up flexion and abduction 1x10 each Modified downward dog on ball 5x, 5 sec holds (easy) 3 way side to side and forward  Supine scapular series with yellow theraband x10 each returning  therapist demo for each with no pain or discomfort reported during Manual Therapy MFR to Rt axilla and upper arm at area of cording P/ROM in supine to Rt shoulder into flexion, abduction and D2 to pts available end motions and with scapular depression applied throughout STM superior to incision where scar tissue palpable, also in axilla over scar tissue noted here at base of cording Scap Mobs in Lt S/L to Rt scapula into protraction and retraction  12/23/21 Therapeutic Exercise: Pulleys flexion and abduction 2 min Ball roll up flexion and abduction 1x10 each Modified downward dog on ball 5x, 5 sec holds (easy) Supine scapular series with yellow theraband x10 each returning therapist demo for each with no pain or discomfort reported during Manual Therapy MFR to Rt axilla and upper arm at area of cording P/ROM in supine to Rt shoulder into flexion, abduction and D2 to pts available end motions and with scapular depression applied throughout STM superior to incision where scar tissue palpable, also in axilla over scar tissue noted here at base of cording Scap Mobs in Lt S/L to Rt scapula into protraction and retraction  12/20/21 Therapeutic Exercise: Pulleys flexion and abduction 2 min each with Vcs reminder to decrease Rt scapular compensation  Ball roll up flexion and abduction 1x10 each Modified downward dog on wall 5x, 5 sec holds  Supine scapular series with yellow theraband x5 each returning therapist demo for each with no pain or discomfort reported during Manual Therapy MFR to Rt axilla and upper arm at area of cording, some softening of this noted today P/ROM in supine to Rt shoulder into flexion, abduction and D2 to pts available end motions and with scapular depression applied throughout STM superior to incision where scar tissue palpable, also in axilla over scar tissue noted here at base of cording Scap Mobs in Lt S/L to Rt scapula into protraction and  retraction  12/16/21 Therapeutic Exercise: Pulleys flexion and abduction 2 min  each returning therapist demo Ball roll up flexion and abduction 1x10 each Modified downward dog on wall 5x, 5 sec holds  Manual Therapy MFR to Rt axilla and upper  arm at area of cording P/ROM in supine to Rt shoulder into flexion, abduction and D2 to pts available end motions and with scapular depression applied throughout STM superior to incision where scar tissue palpable, also in axilla over scar tissue noted here at base of cording Scap Mobs in Lt S/L to Rt scapula into protraction and retraction and prolonged holds pulling scapula away from rib cage  PATIENT EDUCATION:  Education details: Closed chain flexion and abduction shoulder exercises; Supine scapular series with yellow theraband - 12/20/21 Person educated: Patient and Spouse Education method: Explanation, Demonstration, and Handouts Education comprehension: verbalized understanding and returned demonstration, will benefit from further review  HOME EXERCISE PROGRAM:  Reviewed previously given post op HEP. Added closed chain flexion and abduction exercises for right shoulder.  ASSESSMENT: CLINICAL IMPRESSION: Pt responded well to therapy today. Today's session focused on right UE stretching and STM to the right axillary cording. Patient has made great improvements and is feeling great about her progress she has made. Review patient advance HEP and encouraged to stay active and stretching during treatment. Patient was educated that if she has a return in symptoms as she progress through treatment she would be able to return.   Pt will benefit from skilled therapeutic intervention to improve on the following deficits: Decreased knowledge of precautions, impaired UE functional use, pain, decreased ROM, postural dysfunction, manual therapy, manual lymph drainage, PROM, joint mobilizations, and dry needling.   PT treatment/interventions: ADL/Self care  home management, Therapeutic exercises, Therapeutic activity, Patient/Family education, Self Care, Manual therapy, and Re-evaluation  GOALS: Goals reviewed with patient? Yes  LONG TERM GOALS:  (STG=LTG)  GOALS Name Target Date  Goal status  1 Pt will demonstrate she has regained full shoulder ROM and function post operatively compared to baselines.  Baseline: 01/06/2022 MET  2 Patient reports >/= 50% less tightness and discomfort at end ROM with right shoulder flexion and abduction to tolerate reaching. 01/06/2022 MET 95% less tight   3 Patient will demonstrate no visible sign of axillary cording for improved overall ROM. 01/06/2022 MET     PLAN: PT FREQUENCY/DURATION: discharge   PLAN FOR NEXT SESSION:  discharge   Rosario Jacks, Student-PT 01/01/2022  9:56 AM    PHYSICAL THERAPY DISCHARGE SUMMARY  Visits from Start of Care: 10  Current functional level related to goals / functional outcomes: Goals met    Remaining deficits: Goals met    Education / Equipment: Advance HEP   Patient agrees to discharge. Patient goals were met. Patient is being discharged due to meeting the stated rehab goals.    Over Head Pull: Narrow and Wide Grip   Cancer Rehab [phone]   On back, knees bent, feet flat, band across thighs, elbows straight but relaxed. Pull hands apart (start). Keeping elbows straight, bring arms up and over head, hands toward floor. Keep pull steady on band. Hold momentarily. Return slowly, keeping pull steady, back to start. Then do same with a wider grip on the band (past shoulder width) Repeat _5-10__ times. Band color __yellow____   Side Pull: Double Arm   On back, knees bent, feet flat. Arms perpendicular to body, shoulder level, elbows straight but relaxed. Pull arms out to sides, elbows straight. Resistance band comes across collarbones, hands toward floor. Hold momentarily. Slowly return to starting position. Repeat _5-10__ times. Band color _yellow____    Sword   On back, knees bent, feet flat, left hand on left hip, right hand above left. Pull right arm DIAGONALLY (hip  to shoulder) across chest. Bring right arm along head toward floor. Hold momentarily. Slowly return to starting position. Repeat _5-10__ times. Do with left arm. Band color _yellow_____   Shoulder Rotation: Double Arm   On back, knees bent, feet flat, elbows tucked at sides, bent 90, hands palms up. Pull hands apart and down toward floor, keeping elbows near sides. Hold momentarily. Slowly return to starting position. Repeat _5-10__ times. Band color __yellow____

## 2022-01-07 ENCOUNTER — Encounter: Payer: Self-pay | Admitting: Oncology

## 2022-01-07 MED FILL — Dexamethasone Sodium Phosphate Inj 100 MG/10ML: INTRAMUSCULAR | Qty: 1 | Status: AC

## 2022-01-08 ENCOUNTER — Inpatient Hospital Stay: Payer: 59

## 2022-01-08 ENCOUNTER — Telehealth: Payer: Self-pay | Admitting: *Deleted

## 2022-01-08 ENCOUNTER — Inpatient Hospital Stay (HOSPITAL_BASED_OUTPATIENT_CLINIC_OR_DEPARTMENT_OTHER): Payer: 59 | Admitting: Oncology

## 2022-01-08 ENCOUNTER — Encounter: Payer: Self-pay | Admitting: Oncology

## 2022-01-08 VITALS — BP 123/92 | HR 75 | Temp 97.7°F | Resp 16 | Wt 136.6 lb

## 2022-01-08 VITALS — BP 137/86

## 2022-01-08 DIAGNOSIS — Z170 Estrogen receptor positive status [ER+]: Secondary | ICD-10-CM | POA: Diagnosis not present

## 2022-01-08 DIAGNOSIS — Z5111 Encounter for antineoplastic chemotherapy: Secondary | ICD-10-CM | POA: Diagnosis not present

## 2022-01-08 DIAGNOSIS — D72828 Other elevated white blood cell count: Secondary | ICD-10-CM

## 2022-01-08 DIAGNOSIS — L270 Generalized skin eruption due to drugs and medicaments taken internally: Secondary | ICD-10-CM

## 2022-01-08 DIAGNOSIS — D729 Disorder of white blood cells, unspecified: Secondary | ICD-10-CM

## 2022-01-08 DIAGNOSIS — Z79899 Other long term (current) drug therapy: Secondary | ICD-10-CM | POA: Diagnosis not present

## 2022-01-08 DIAGNOSIS — C50311 Malignant neoplasm of lower-inner quadrant of right female breast: Secondary | ICD-10-CM

## 2022-01-08 DIAGNOSIS — Z5189 Encounter for other specified aftercare: Secondary | ICD-10-CM | POA: Diagnosis not present

## 2022-01-08 LAB — COMPREHENSIVE METABOLIC PANEL
ALT: 25 U/L (ref 0–44)
AST: 27 U/L (ref 15–41)
Albumin: 3.8 g/dL (ref 3.5–5.0)
Alkaline Phosphatase: 67 U/L (ref 38–126)
Anion gap: 7 (ref 5–15)
BUN: 14 mg/dL (ref 6–20)
CO2: 25 mmol/L (ref 22–32)
Calcium: 9 mg/dL (ref 8.9–10.3)
Chloride: 107 mmol/L (ref 98–111)
Creatinine, Ser: 0.48 mg/dL (ref 0.44–1.00)
GFR, Estimated: >60 mL/min (ref >60)
Glucose, Bld: 111 mg/dL — ABNORMAL HIGH (ref 70–99)
Potassium: 3.7 mmol/L (ref 3.5–5.1)
Sodium: 139 mmol/L (ref 135–145)
Total Bilirubin: 0.5 mg/dL (ref 0.3–1.2)
Total Protein: 6.8 g/dL (ref 6.5–8.1)

## 2022-01-08 LAB — CBC WITH DIFFERENTIAL/PLATELET
Abs Immature Granulocytes: 0.07 10*3/uL (ref 0.00–0.07)
Basophils Absolute: 0 10*3/uL (ref 0.0–0.1)
Basophils Relative: 0 %
Eosinophils Absolute: 0 10*3/uL (ref 0.0–0.5)
Eosinophils Relative: 0 %
HCT: 37 % (ref 36.0–46.0)
Hemoglobin: 12.3 g/dL (ref 12.0–15.0)
Immature Granulocytes: 0 %
Lymphocytes Relative: 9 %
Lymphs Abs: 1.5 10*3/uL (ref 0.7–4.0)
MCH: 33.2 pg (ref 26.0–34.0)
MCHC: 33.2 g/dL (ref 30.0–36.0)
MCV: 100 fL (ref 80.0–100.0)
Monocytes Absolute: 1.6 10*3/uL — ABNORMAL HIGH (ref 0.1–1.0)
Monocytes Relative: 10 %
Neutro Abs: 13 10*3/uL — ABNORMAL HIGH (ref 1.7–7.7)
Neutrophils Relative %: 81 %
Platelets: 257 10*3/uL (ref 150–400)
RBC: 3.7 MIL/uL — ABNORMAL LOW (ref 3.87–5.11)
RDW: 14.6 % (ref 11.5–15.5)
WBC: 16.2 10*3/uL — ABNORMAL HIGH (ref 4.0–10.5)
nRBC: 0 % (ref 0.0–0.2)

## 2022-01-08 MED ORDER — SODIUM CHLORIDE 0.9 % IV SOLN
10.0000 mg | Freq: Once | INTRAVENOUS | Status: AC
  Administered 2022-01-08: 10 mg via INTRAVENOUS
  Filled 2022-01-08: qty 10

## 2022-01-08 MED ORDER — SODIUM CHLORIDE 0.9 % IV SOLN
1000.0000 mg | Freq: Once | INTRAVENOUS | Status: AC
  Administered 2022-01-08: 1000 mg via INTRAVENOUS
  Filled 2022-01-08: qty 50

## 2022-01-08 MED ORDER — SODIUM CHLORIDE 0.9 % IV SOLN
75.0000 mg/m2 | Freq: Once | INTRAVENOUS | Status: AC
  Administered 2022-01-08: 130 mg via INTRAVENOUS
  Filled 2022-01-08: qty 13

## 2022-01-08 MED ORDER — HEPARIN SOD (PORK) LOCK FLUSH 100 UNIT/ML IV SOLN
500.0000 [IU] | Freq: Once | INTRAVENOUS | Status: DC | PRN
  Filled 2022-01-08: qty 5

## 2022-01-08 MED ORDER — PALONOSETRON HCL INJECTION 0.25 MG/5ML
0.2500 mg | Freq: Once | INTRAVENOUS | Status: AC
  Administered 2022-01-08: 0.25 mg via INTRAVENOUS
  Filled 2022-01-08: qty 5

## 2022-01-08 MED ORDER — SODIUM CHLORIDE 0.9 % IV SOLN
Freq: Once | INTRAVENOUS | Status: AC
  Filled 2022-01-08: qty 250

## 2022-01-08 MED ORDER — SODIUM CHLORIDE 0.9% FLUSH
10.0000 mL | INTRAVENOUS | Status: DC | PRN
  Filled 2022-01-08: qty 10

## 2022-01-08 NOTE — Progress Notes (Signed)
Hematology/Oncology Consult note [facility]  [phone] [phone]  Patient Care Team: [name], FNP as PCP - General (Family Medicine) Daiva Huge, RN as Oncology Nurse Navigator   Name of the patient: Adrienne Harris  [id number]  02-23-1975   Date of visit: 01/08/22  Diagnosis-  pathological prognostic stage Ia invasive mammary carcinoma of the right breast ER/PR positive HER2 negative   Chief complaint/ Reason for visit-on treatment assessment prior to cycle 3 of adjuvant TC chemotherapy  Heme/Onc history:  patient is a 46 year old female who recently underwent aLateral screening mammogram which showed a possible area of concern in the right breast.  This was followed by diagnostic mammogram and ultrasound which showed a 7 mm mass in the right lower breast.  No suspicious right axillary adenopathy.  Biopsy confirmed invasive mammary carcinoma 6 mm grade 3 ER 51 to 90% positive PR 51 to 90% positive and HER2 negative.     Oncotype score came back at 23 with an absolute chemotherapy benefit of about 6.5%.  Adjuvant TC chemotherapy was recommended.  Interval history-patient is tolerating chemotherapy well so far.  Denies any nausea vomiting or peripheral neuropathy symptoms.  She does develop a skin rash mainly over her bilateral thighs which has been overall improving.  She was on an oral steroid course which she completed about a week ago.  She is not using much of her topical steroids.  Does use occasional Benadryl for itching.  Also reports some trouble sleeping.  ECOG PS- 0 Pain scale- 0   Review of systems- Review of Systems  Constitutional:  Negative for chills, fever, malaise/fatigue and weight loss.  HENT:  Negative for congestion, ear discharge and nosebleeds.   Eyes:  Negative for blurred vision.  Respiratory:  Negative for cough, hemoptysis, sputum production, shortness of breath and wheezing.   Cardiovascular:   Negative for chest pain, palpitations, orthopnea and claudication.  Gastrointestinal:  Negative for abdominal pain, blood in stool, constipation, diarrhea, heartburn, melena, nausea and vomiting.  Genitourinary:  Negative for dysuria, flank pain, frequency, hematuria and urgency.  Musculoskeletal:  Negative for back pain, joint pain and myalgias.  Skin:  Positive for rash.  Neurological:  Negative for dizziness, tingling, focal weakness, seizures, weakness and headaches.  Endo/Heme/Allergies:  Does not bruise/bleed easily.  Psychiatric/Behavioral:  Negative for depression and suicidal ideas. The patient does not have insomnia.       Allergies  Allergen Reactions   Propylene Glycol     Skin rash   Hydrocortisone Rash    Allergic to all topical steriods Allergic to all topical corticosteriods   Other Rash    Topical steroid      Past Medical History:  Diagnosis Date   Atopic dermatitis    Basal cell carcinoma    15 years ago.   Dysrhythmia    Hemochromatosis    Heterozygous for two genes   Palpitations    a. 04/2013 48h Holter: sinus rhythm/sinus arrhythmia, rare PAC's/PVC's.   PVC (premature ventricular contraction)    Shoulder pain, left 01/24/2015   Shoulder pain, right 01/24/2015     Past Surgical History:  Procedure Laterality Date   BREAST BIOPSY Right 09/25/2021   BREAST LUMPECTOMY WITH RADIOACTIVE SEED AND SENTINEL LYMPH NODE BIOPSY Right 10/28/2021   Procedure: RIGHT BREAST LUMPECTOMY WITH RADIOACTIVE SEED AND AXILLARY SENTINEL LYMPH NODE BIOPSY;  Surgeon: Rolm Bookbinder, MD;  Location: [facility];  Service: General;  Laterality: Right;  COLONOSCOPY WITH PROPOFOL N/A 01/24/2021   Procedure: COLONOSCOPY WITH PROPOFOL;  Surgeon: [name], MD;  Location: [facility] ENDOSCOPY;  Service: Endoscopy;  Laterality: N/A;   Excision of basal cell carcinoma     PORTACATH PLACEMENT Left 11/19/2021   Procedure: INSERTION PORT-A-CATH;  Surgeon: Rolm Bookbinder, MD;  Location: [place];  Service: General;  Laterality: Left;   WISDOM TOOTH EXTRACTION      Social History   Socioeconomic History   Marital status: Married    Spouse name: Not on file   Number of children: 1   Years of education: Not on file   Highest education level: Not on file  Occupational History   Occupation: Dietitian  Tobacco Use   Smoking status: Never   Smokeless tobacco: Never  Vaping Use   Vaping Use: Never used  Substance and Sexual Activity   Alcohol use: Yes    Alcohol/week: 2.0 standard drinks of alcohol    Types: 2 Cans of beer per week    Comment: 2-4 beer/wine aweek.   Drug use: No   Sexual activity: Yes    Partners: Male  Other Topics Concern   Not on file  Social History Narrative   Married      Daughter -39 yrs.       Dietitian for [employer]   Social Determinants of Health   Financial Resource Strain: Not on file  Food Insecurity: Not on file  Transportation Needs: Not on file  Physical Activity: Inactive (05/15/2017)   Exercise Vital Sign    Days of Exercise per Week: 0 days    Minutes of Exercise per Session: 0 min  Stress: No Stress Concern Present (05/15/2017)   [place]    Feeling of Stress : Only a little  Social Connections: Not on file  Intimate Partner Violence: Not on file    Family History  Problem Relation Age of Onset   Hemochromatosis Mother    Basal cell carcinoma Mother    Hypertension Father    Melanoma Father        multiple removed   Colon polyps Father 69       adenomatous   Breast cancer Paternal Aunt 3   Kidney cancer Paternal Aunt    Alzheimer's disease Paternal Aunt    Hyperlipidemia Maternal Grandmother    Hypertension Maternal Grandmother    Osteoporosis Maternal Grandmother    Hyperlipidemia Maternal Grandfather    Hypertension Maternal Grandfather    Diabetes Maternal Grandfather    Stroke Maternal Grandfather     Alzheimer's disease Paternal Grandmother    Breast cancer Other        late years   Breast cancer Other        late years   Colon cancer Neg Hx      Current Outpatient Medications:    acetaminophen (TYLENOL) 500 MG tablet, Take 1,000 mg by mouth every 8 (eight) hours as needed for moderate pain., Disp: , Rfl:    betamethasone dipropionate 0.05 % cream, Apply topically 2 (two) times daily., Disp: 30 g, Rfl: 0   cholecalciferol (VITAMIN D) 1000 units tablet, Take 1,000 Units by mouth daily., Disp: , Rfl:    Cyanocobalamin (VITAMIN B 12 PO), Take 1,000 mcg by mouth daily., Disp: , Rfl:    dexamethasone (DECADRON) 4 MG tablet, Take 2 tabs by mouth 2 times daily starting day before chemo. Then take 2 tabs daily for 2 days starting day  after chemo. Take with food., Disp: 30 tablet, Rfl: 1   famotidine (PEPCID) 20 MG tablet, Take 20 mg by mouth 2 (two) times daily., Disp: , Rfl:    levonorgestrel-ethinyl estradiol (AVIANE) 0.1-20 MG-MCG tablet, Take 1 tablet by mouth daily., Disp: 84 tablet, Rfl: 3   lidocaine-prilocaine (EMLA) cream, Apply to affected area once, Disp: 30 g, Rfl: 3   loratadine (CLARITIN) 10 MG tablet, Take 10 mg by mouth daily., Disp: , Rfl:    propranolol (INDERAL) 20 MG tablet, Take 1 tablet (20 mg total) by mouth daily., Disp: 90 tablet, Rfl: 3   dexamethasone (DECADRON) 4 MG tablet, Take 1 tablet (4 mg total) by mouth 2 (two) times daily with a meal. (Patient not taking: Reported on 01/08/2022), Disp: 14 tablet, Rfl: 2   ibuprofen (ADVIL,MOTRIN) 200 MG tablet, Take 400 mg by mouth every 8 (eight) hours as needed for moderate pain. (Patient not taking: Reported on 01/08/2022), Disp: , Rfl:    loperamide (IMODIUM) 2 MG capsule, Take by mouth as needed for diarrhea or loose stools. (Patient not taking: Reported on 01/08/2022), Disp: , Rfl:    ondansetron (ZOFRAN) 8 MG tablet, Take 1 tablet (8 mg total) by mouth every 8 (eight) hours as needed for nausea or vomiting. Start on the  third day after chemotherapy. (Patient not taking: Reported on 01/08/2022), Disp: 30 tablet, Rfl: 1   prochlorperazine (COMPAZINE) 10 MG tablet, Take 1 tablet (10 mg total) by mouth every 6 (six) hours as needed for nausea or vomiting. (Patient not taking: Reported on 01/08/2022), Disp: 30 tablet, Rfl: 1   tacrolimus (PROTOPIC) 0.1 % ointment, Apply to affected areas twice daily as needed (Patient not taking: Reported on 01/08/2022), Disp: 60 g, Rfl: 2   traMADol (ULTRAM) 50 MG tablet, Take 1 tablet (50 mg total) by mouth every 6 (six) hours as needed. (Patient not taking: Reported on 01/08/2022), Disp: 10 tablet, Rfl: 0  Physical exam:  Vitals:   01/08/22 0934  BP: (!) 123/92  Pulse: 75  Resp: 16  Temp: 97.7 F (36.5 C)  TempSrc: Tympanic  SpO2: 100%  Weight: 136 lb 9.6 oz (62 kg)   Physical Exam Cardiovascular:     Rate and Rhythm: Normal rate and regular rhythm.     Heart sounds: Normal heart sounds.  Pulmonary:     Effort: Pulmonary effort is normal.  Skin:    General: Skin is warm and dry.  Neurological:     Mental Status: She is alert and oriented to person, place, and time.         Latest Ref Rng & Units 01/08/2022    8:59 AM  CMP  Glucose 70 - 99 mg/dL 111   BUN 6 - 20 mg/dL 14   Creatinine 0.44 - 1.00 mg/dL 0.48   Sodium 135 - 145 mmol/L 139   Potassium 3.5 - 5.1 mmol/L 3.7   Chloride 98 - 111 mmol/L 107   CO2 22 - 32 mmol/L 25   Calcium 8.9 - 10.3 mg/dL 9.0   Total Protein 6.5 - 8.1 g/dL 6.8   Total Bilirubin 0.3 - 1.2 mg/dL 0.5   Alkaline Phos 38 - 126 U/L 67   AST 15 - 41 U/L 27   ALT 0 - 44 U/L 25       Latest Ref Rng & Units 01/08/2022    8:59 AM  CBC  WBC 4.0 - 10.5 K/uL 16.2   Hemoglobin 12.0 - 15.0 g/dL 12.3   Hematocrit  36.0 - 46.0 % 37.0   Platelets 150 - 400 K/uL 257     No images are attached to the encounter.  No results found.   Assessment and plan- Patient is a 46 y.o. female with pathological prognostic stage Ia invasive mammary  carcinoma of the right breast's pT1b N0 M0 ER 51 to 90% positive PR 51 to 90% positive and HER2 negative.  She is here for on treatment assessment prior to cycle 3 of adjuvant TC chemotherapy  Neutrophilia likely due to recent oral steroid use.  She will proceed with cycle 3 of adjuvant TC chemotherapy today and will receive Udenyca on day 3 given the risk of neutropenic fever associated with this regimen.  I will see her back in 3 weeks for cycle 4 which would be her last cycle.  Drug-induced skin rash: Likely secondary to Taxotere.  Overall self-limited.  She has topical steroids and hopefully should not require another course of oral steroids.  Radiation will start after 4 cycles of [facility] at [facility]   Visit Diagnosis 1. Encounter for antineoplastic chemotherapy   2. Malignant neoplasm of lower-inner quadrant of right breast of female, estrogen receptor positive ([place])   3. Drug-induced skin rash   4. Neutrophilia      Dr. Randa Evens, MD, MPH [facility] at [facility] [phone] 01/08/2022 10:08 AM

## 2022-01-08 NOTE — Progress Notes (Signed)
Pt and husband in for follow up and treatment today.  Pt reports she has finished dexamethasone for rash and no longer an issue.

## 2022-01-08 NOTE — Patient Instructions (Signed)
[facility] CANCER CTR AT [place]  Discharge Instructions: Thank you for choosing [facility] to provide your oncology and hematology care.  If you have a lab appointment with the [facility], please go directly to the [facility] and check in at the registration area.  Wear comfortable clothing and clothing appropriate for easy access to any Portacath or PICC line.   We strive to give you quality time with your provider. You may need to reschedule your appointment if you arrive late (15 or more minutes).  Arriving late affects you and other patients whose appointments are after yours.  Also, if you miss three or more appointments without notifying the office, you may be dismissed from the clinic at the provider's discretion.      For prescription refill requests, have your pharmacy contact our office and allow 72 hours for refills to be completed.    Today you received the following chemotherapy and/or immunotherapy agents- taxotere, cytoxan      To help prevent nausea and vomiting after your treatment, we encourage you to take your nausea medication as directed.  BELOW ARE SYMPTOMS THAT SHOULD BE REPORTED IMMEDIATELY: *FEVER GREATER THAN 100.4 F (38 C) OR HIGHER *CHILLS OR SWEATING *NAUSEA AND VOMITING THAT IS NOT CONTROLLED WITH YOUR NAUSEA MEDICATION *UNUSUAL SHORTNESS OF BREATH *UNUSUAL BRUISING OR BLEEDING *URINARY PROBLEMS (pain or burning when urinating, or frequent urination) *BOWEL PROBLEMS (unusual diarrhea, constipation, pain near the anus) TENDERNESS IN MOUTH AND THROAT WITH OR WITHOUT PRESENCE OF ULCERS (sore throat, sores in mouth, or a toothache) UNUSUAL RASH, SWELLING OR PAIN  UNUSUAL VAGINAL DISCHARGE OR ITCHING   Items with * indicate a potential emergency and should be followed up as soon as possible or go to the Emergency Department if any problems should occur.  Please show the CHEMOTHERAPY ALERT CARD or IMMUNOTHERAPY ALERT CARD at  check-in to the Emergency Department and triage nurse.  Should you have questions after your visit or need to cancel or reschedule your appointment, please contact [facility] CANCER [place] AT [place]  [phone] and follow the prompts.  Office hours are 8:00 a.m. to 4:30 p.m. Monday - Friday. Please note that voicemails left after 4:00 p.m. may not be returned until the following business day.  We are closed weekends and major holidays. You have access to a nurse at all times for urgent questions. Please call the main number to the clinic [phone] and follow the prompts.  For any non-urgent questions, you may also contact your provider using MyChart. We now offer e-Visits for anyone 42 and older to request care online for non-urgent symptoms. For details visit [email or web address].   Also download the MyChart app! Go to the app store, search "MyChart", open the app, select [facility] [place], and log in with your MyChart username and password.  Masks are optional in the cancer centers. If you would like for your care team to wear a mask while they are taking care of you, please let them know. For doctor visits, patients may have with them one support person who is at least 46 years old. At this time, visitors are not allowed in the infusion area.

## 2022-01-08 NOTE — Telephone Encounter (Signed)
Pt gave me a paper to get compression sleeve and gauntlet and it  was faxed to [phone]

## 2022-01-10 ENCOUNTER — Inpatient Hospital Stay: Payer: 59 | Attending: Oncology

## 2022-01-10 DIAGNOSIS — Z5111 Encounter for antineoplastic chemotherapy: Secondary | ICD-10-CM | POA: Diagnosis not present

## 2022-01-10 DIAGNOSIS — Z483 Aftercare following surgery for neoplasm: Secondary | ICD-10-CM | POA: Diagnosis not present

## 2022-01-10 DIAGNOSIS — Z5189 Encounter for other specified aftercare: Secondary | ICD-10-CM | POA: Insufficient documentation

## 2022-01-10 DIAGNOSIS — C50311 Malignant neoplasm of lower-inner quadrant of right female breast: Secondary | ICD-10-CM | POA: Insufficient documentation

## 2022-01-10 DIAGNOSIS — Z170 Estrogen receptor positive status [ER+]: Secondary | ICD-10-CM | POA: Diagnosis not present

## 2022-01-10 MED ORDER — PEGFILGRASTIM-CBQV 6 MG/0.6ML ~~LOC~~ SOSY
6.0000 mg | PREFILLED_SYRINGE | Freq: Once | SUBCUTANEOUS | Status: AC
  Administered 2022-01-10: 6 mg via SUBCUTANEOUS
  Filled 2022-01-10: qty 0.6

## 2022-01-23 ENCOUNTER — Encounter: Payer: Self-pay | Admitting: Oncology

## 2022-01-23 ENCOUNTER — Ambulatory Visit: Payer: 59 | Admitting: Physical Therapy

## 2022-01-25 ENCOUNTER — Encounter: Payer: Self-pay | Admitting: Oncology

## 2022-01-27 ENCOUNTER — Ambulatory Visit: Payer: Commercial Managed Care - PPO | Attending: General Surgery | Admitting: Physical Therapy

## 2022-01-27 DIAGNOSIS — Z483 Aftercare following surgery for neoplasm: Secondary | ICD-10-CM | POA: Insufficient documentation

## 2022-01-27 NOTE — Therapy (Signed)
OUTPATIENT PHYSICAL THERAPY SOZO SCREENING NOTE   Patient Name: Adrienne Harris MRN: [id number] DOB:1975/05/27, 46 y.o., female Today's Date: 01/27/2022  PCP: [name], FNP REFERRING PROVIDER: Rolm Bookbinder, MD   PT End of Session - 01/27/22 0910     Visit Number 10    PT Start Time 0850    PT Stop Time 0910    PT Time Calculation (min) 20 min    Activity Tolerance Patient tolerated treatment well    Behavior During Therapy [facility] for tasks assessed/performed             Past Medical History:  Diagnosis Date   Atopic dermatitis    Basal cell carcinoma    15 years ago.   Dysrhythmia    Hemochromatosis    Heterozygous for two genes   Palpitations    a. 04/2013 48h Holter: sinus rhythm/sinus arrhythmia, rare PAC's/PVC's.   PVC (premature ventricular contraction)    Shoulder pain, left 01/24/2015   Shoulder pain, right 01/24/2015   Past Surgical History:  Procedure Laterality Date   BREAST BIOPSY Right 09/25/2021   BREAST LUMPECTOMY WITH RADIOACTIVE SEED AND SENTINEL LYMPH NODE BIOPSY Right 10/28/2021   Procedure: RIGHT BREAST LUMPECTOMY WITH RADIOACTIVE SEED AND AXILLARY SENTINEL LYMPH NODE BIOPSY;  Surgeon: Rolm Bookbinder, MD;  Location: [facility];  Service: General;  Laterality: Right;   COLONOSCOPY WITH PROPOFOL N/A 01/24/2021   Procedure: COLONOSCOPY WITH PROPOFOL;  Surgeon: [name], MD;  Location: [facility] ENDOSCOPY;  Service: Endoscopy;  Laterality: N/A;   Excision of basal cell carcinoma     PORTACATH PLACEMENT Left 11/19/2021   Procedure: INSERTION PORT-A-CATH;  Surgeon: Rolm Bookbinder, MD;  Location: [facility];  Service: General;  Laterality: Left;   WISDOM TOOTH EXTRACTION     Patient Active Problem List   Diagnosis Date Noted   Genetic testing 10/24/2021   Malignant neoplasm of lower-inner quadrant of right breast of female, estrogen receptor positive ([place]) 10/01/2021   Colon cancer screening     Intractable menstrual migraine without status migrainosus 07/30/2020   Nonallopathic lesion of sacral region 12/30/2017   Trigger point of left shoulder region 10/06/2017   Slipped rib syndrome 08/05/2017   Nonallopathic lesion of rib cage 08/05/2017   Nonallopathic lesion of cervical region 08/05/2017   Nonallopathic lesion of thoracic region 08/05/2017   Nonallopathic lesion of lumbosacral region 08/05/2017   Chronic left shoulder pain 07/10/2017   Chronic LLQ pain 05/17/2017   Atopic dermatitis 07/10/2016   Hepatic cyst 07/10/2016   Routine physical examination 07/10/2016   PVC's (premature ventricular contractions) 04/22/2013   Hemochromatosis 04/22/2013    REFERRING DIAG: right breast cancer at risk for lymphedema  THERAPY DIAG:  Aftercare following surgery for neoplasm  PERTINENT HISTORY: Patient was diagnosed on 09/25/2021 with right grade 3 invasive mammary carcinoma of no particular type. She underwent a right lumpectomy and sentinel node biopsy on 09/28/2021. It is ER/PR positive and HER2 negative and the Ki67 was not measured.   PRECAUTIONS: right UE Lymphedema risk  SUBJECTIVE: Here for SOZO screen; also has concerns about right axillary tightness and possible seroma.  PAIN:  Are you having pain? No  SOZO SCREENING: Patient was assessed today using the SOZO machine to determine the lymphedema index score. This was compared to her baseline score. It was determined that she is within the recommended range when compared to her baseline and no further action is needed at this time. She will continue SOZO screenings. These are  done every 3 months for 2 years post operatively followed by every 6 months for 2 years, and then annually.   L-DEX FLOWSHEETS - 01/27/22 0900       L-DEX LYMPHEDEMA SCREENING   Measurement Type Unilateral    L-DEX MEASUREMENT EXTREMITY Upper Extremity    POSITION  Standing    DOMINANT SIDE Right    At Risk Side Right    BASELINE SCORE  (UNILATERAL) -6.4    L-DEX SCORE (UNILATERAL) -2.4    VALUE CHANGE (UNILAT) 4             Examined right axilla. She appears to have scar tissue in a tight band just superior to her incision site. Instructed/reviewed with pt how to do scar massage to reduce the tightness and bulky feeling of scar tissue. Pt demonstrated good understanding. Darkened skin still present from Mag trace in axilla and inferior to areola.  Annia Friendly, [place] 01/27/22 9:23 AM

## 2022-01-28 MED FILL — Dexamethasone Sodium Phosphate Inj 100 MG/10ML: INTRAMUSCULAR | Qty: 1 | Status: AC

## 2022-01-29 ENCOUNTER — Inpatient Hospital Stay: Payer: 59

## 2022-01-29 ENCOUNTER — Encounter: Payer: Self-pay | Admitting: Oncology

## 2022-01-29 ENCOUNTER — Telehealth: Payer: Self-pay | Admitting: *Deleted

## 2022-01-29 ENCOUNTER — Other Ambulatory Visit: Payer: Self-pay | Admitting: *Deleted

## 2022-01-29 ENCOUNTER — Inpatient Hospital Stay (HOSPITAL_BASED_OUTPATIENT_CLINIC_OR_DEPARTMENT_OTHER): Payer: 59 | Admitting: Oncology

## 2022-01-29 ENCOUNTER — Ambulatory Visit
Admission: RE | Admit: 2022-01-29 | Discharge: 2022-01-29 | Disposition: A | Discharge status: Home / Self Care | Payer: 59 | Source: Ambulatory Visit | Attending: Oncology | Admitting: Oncology

## 2022-01-29 ENCOUNTER — Encounter: Payer: Self-pay | Admitting: *Deleted

## 2022-01-29 VITALS — BP 141/93 | HR 83 | Temp 98.6°F | Resp 16 | Ht 66.0 in | Wt 137.8 lb

## 2022-01-29 DIAGNOSIS — M8589 Other specified disorders of bone density and structure, multiple sites: Secondary | ICD-10-CM | POA: Diagnosis not present

## 2022-01-29 DIAGNOSIS — Z170 Estrogen receptor positive status [ER+]: Secondary | ICD-10-CM

## 2022-01-29 DIAGNOSIS — C50311 Malignant neoplasm of lower-inner quadrant of right female breast: Secondary | ICD-10-CM

## 2022-01-29 DIAGNOSIS — E876 Hypokalemia: Secondary | ICD-10-CM

## 2022-01-29 DIAGNOSIS — Z5189 Encounter for other specified aftercare: Secondary | ICD-10-CM | POA: Diagnosis not present

## 2022-01-29 DIAGNOSIS — Z5111 Encounter for antineoplastic chemotherapy: Secondary | ICD-10-CM | POA: Diagnosis not present

## 2022-01-29 LAB — CBC WITH DIFFERENTIAL/PLATELET
Abs Immature Granulocytes: 0.08 10*3/uL — ABNORMAL HIGH (ref 0.00–0.07)
Basophils Absolute: 0 10*3/uL (ref 0.0–0.1)
Basophils Relative: 0 %
Eosinophils Absolute: 0 10*3/uL (ref 0.0–0.5)
Eosinophils Relative: 0 %
HCT: 34.4 % — ABNORMAL LOW (ref 36.0–46.0)
Hemoglobin: 11.6 g/dL — ABNORMAL LOW (ref 12.0–15.0)
Immature Granulocytes: 1 %
Lymphocytes Relative: 12 %
Lymphs Abs: 1.4 10*3/uL (ref 0.7–4.0)
MCH: 33.4 pg (ref 26.0–34.0)
MCHC: 33.7 g/dL (ref 30.0–36.0)
MCV: 99.1 fL (ref 80.0–100.0)
Monocytes Absolute: 1.6 10*3/uL — ABNORMAL HIGH (ref 0.1–1.0)
Monocytes Relative: 14 %
Neutro Abs: 8.2 10*3/uL — ABNORMAL HIGH (ref 1.7–7.7)
Neutrophils Relative %: 73 %
Platelets: 326 10*3/uL (ref 150–400)
RBC: 3.47 MIL/uL — ABNORMAL LOW (ref 3.87–5.11)
RDW: 14.6 % (ref 11.5–15.5)
WBC: 11.2 10*3/uL — ABNORMAL HIGH (ref 4.0–10.5)
nRBC: 0 % (ref 0.0–0.2)

## 2022-01-29 LAB — COMPREHENSIVE METABOLIC PANEL
ALT: 36 U/L (ref 0–44)
AST: 26 U/L (ref 15–41)
Albumin: 3.8 g/dL (ref 3.5–5.0)
Alkaline Phosphatase: 70 U/L (ref 38–126)
Anion gap: 5 (ref 5–15)
BUN: 14 mg/dL (ref 6–20)
CO2: 26 mmol/L (ref 22–32)
Calcium: 9.2 mg/dL (ref 8.9–10.3)
Chloride: 110 mmol/L (ref 98–111)
Creatinine, Ser: 0.56 mg/dL (ref 0.44–1.00)
GFR, Estimated: >60 mL/min (ref >60)
Glucose, Bld: 99 mg/dL (ref 70–99)
Potassium: 3.7 mmol/L (ref 3.5–5.1)
Sodium: 141 mmol/L (ref 135–145)
Total Bilirubin: 0.4 mg/dL (ref 0.3–1.2)
Total Protein: 6.5 g/dL (ref 6.5–8.1)

## 2022-01-29 LAB — SURGICAL PATHOLOGY

## 2022-01-29 MED ORDER — HEPARIN SOD (PORK) LOCK FLUSH 100 UNIT/ML IV SOLN
500.0000 [IU] | Freq: Once | INTRAVENOUS | Status: AC | PRN
  Administered 2022-01-29: 500 [IU]
  Filled 2022-01-29: qty 5

## 2022-01-29 MED ORDER — SODIUM CHLORIDE 0.9 % IV SOLN
10.0000 mg | Freq: Once | INTRAVENOUS | Status: AC
  Administered 2022-01-29: 10 mg via INTRAVENOUS
  Filled 2022-01-29: qty 10

## 2022-01-29 MED ORDER — SODIUM CHLORIDE 0.9 % IV SOLN
75.0000 mg/m2 | Freq: Once | INTRAVENOUS | Status: AC
  Administered 2022-01-29: 130 mg via INTRAVENOUS
  Filled 2022-01-29: qty 13

## 2022-01-29 MED ORDER — POTASSIUM CHLORIDE IN NACL 20-0.9 MEQ/L-% IV SOLN
Freq: Once | INTRAVENOUS | Status: DC
  Filled 2022-01-29: qty 1000

## 2022-01-29 MED ORDER — PALONOSETRON HCL INJECTION 0.25 MG/5ML
0.2500 mg | Freq: Once | INTRAVENOUS | Status: AC
  Administered 2022-01-29: 0.25 mg via INTRAVENOUS
  Filled 2022-01-29: qty 5

## 2022-01-29 MED ORDER — SODIUM CHLORIDE 0.9 % IV SOLN
Freq: Once | INTRAVENOUS | Status: AC
  Filled 2022-01-29: qty 250

## 2022-01-29 MED ORDER — SODIUM CHLORIDE 0.9 % IV SOLN
1000.0000 mg | Freq: Once | INTRAVENOUS | Status: AC
  Administered 2022-01-29: 1000 mg via INTRAVENOUS
  Filled 2022-01-29: qty 50

## 2022-01-29 NOTE — Patient Instructions (Signed)
[facility] CANCER CTR AT [place]-MEDICAL ONCOLOGY  Discharge Instructions: Thank you for choosing [facility] to provide your oncology and hematology care.  If you have a lab appointment with the Cancer Center, please go directly to the Cancer Center and check in at the registration area.  Wear comfortable clothing and clothing appropriate for easy access to any Portacath or PICC line.   We strive to give you quality time with your provider. You may need to reschedule your appointment if you arrive late (15 or more minutes).  Arriving late affects you and other patients whose appointments are after yours.  Also, if you miss three or more appointments without notifying the office, you may be dismissed from the clinic at the provider's discretion.      For prescription refill requests, have your pharmacy contact our office and allow 72 hours for refills to be completed.       To help prevent nausea and vomiting after your treatment, we encourage you to take your nausea medication as directed.  BELOW ARE SYMPTOMS THAT SHOULD BE REPORTED IMMEDIATELY: *FEVER GREATER THAN 100.4 F (38 C) OR HIGHER *CHILLS OR SWEATING *NAUSEA AND VOMITING THAT IS NOT CONTROLLED WITH YOUR NAUSEA MEDICATION *UNUSUAL SHORTNESS OF BREATH *UNUSUAL BRUISING OR BLEEDING *URINARY PROBLEMS (pain or burning when urinating, or frequent urination) *BOWEL PROBLEMS (unusual diarrhea, constipation, pain near the anus) TENDERNESS IN MOUTH AND THROAT WITH OR WITHOUT PRESENCE OF ULCERS (sore throat, sores in mouth, or a toothache) UNUSUAL RASH, SWELLING OR PAIN  UNUSUAL VAGINAL DISCHARGE OR ITCHING   Items with * indicate a potential emergency and should be followed up as soon as possible or go to the Emergency Department if any problems should occur.  Please show the CHEMOTHERAPY ALERT CARD or IMMUNOTHERAPY ALERT CARD at check-in to the Emergency Department and triage nurse.  Should you have questions after your  visit or need to cancel or reschedule your appointment, please contact [facility] CANCER CTR AT [place]-MEDICAL ONCOLOGY  [phone] and follow the prompts.  Office hours are 8:00 a.m. to 4:30 p.m. Monday - Friday. Please note that voicemails left after 4:00 p.m. may not be returned until the following business day.  We are closed weekends and major holidays. You have access to a nurse at all times for urgent questions. Please call the main number to the clinic [phone] and follow the prompts.  For any non-urgent questions, you may also contact your provider using MyChart. We now offer e-Visits for anyone 18 and older to request care online for non-urgent symptoms. For details visit [email or web address].   Also download the MyChart app! Go to the app store, search "MyChart", open the app, select , and log in with your MyChart username and password.  Masks are optional in the cancer centers. If you would like for your care team to wear a mask while they are taking care of you, please let them know. For doctor visits, patients may have with them one support person who is at least 46 years old. At this time, visitors are not allowed in the infusion area.   

## 2022-01-29 NOTE — Telephone Encounter (Signed)
Pt needs clearance to get zometa and her dentist is at [facility]. I wrote a letter for clearance and sent it to [facility] by email. The fax is hard to get it to ever get through so I used the other option to email to [email or web address]. it did go through.

## 2022-01-29 NOTE — Progress Notes (Signed)
Hematology/Oncology Consult note [facility]  [phone] [phone]  Patient Care Team: [name], FNP as PCP - General (Family Medicine) Daiva Huge, RN as Oncology Nurse Navigator   Name of the patient: Adrienne Harris  [id number]  12/25/1975   Date of visit: 01/29/22  Diagnosis- pathological prognostic stage Ia invasive mammary carcinoma of the right breast ER/PR positive HER2 negative    Chief complaint/ Reason for visit-on treatment assessment prior to cycle 4 of adjuvant TC chemotherapy  Heme/Onc history: patient is a 46 year old female who recently underwent aLateral screening mammogram which showed a possible area of concern in the right breast.  This was followed by diagnostic mammogram and ultrasound which showed a 7 mm mass in the right lower breast.  No suspicious right axillary adenopathy.  Biopsy confirmed invasive mammary carcinoma 6 mm grade 3 ER 51 to 90% positive PR 51 to 90% positive and HER2 negative.     Oncotype score came back at 23 with an absolute chemotherapy benefit of about 6.5%.  Adjuvant TC chemotherapy was recommended.    Interval history-tolerating treatments well so far.  Denies any significant tingling numbness in her extremities.  She does have on and off erythematous rash especially over her thighs but she has not required any oral steroids recently.  She does have as needed topical steroids  ECOG PS- 0 Pain scale- 0   Review of systems- Review of Systems  Constitutional:  Positive for malaise/fatigue. Negative for chills, fever and weight loss.  HENT:  Negative for congestion, ear discharge and nosebleeds.   Eyes:  Negative for blurred vision.  Respiratory:  Negative for cough, hemoptysis, sputum production, shortness of breath and wheezing.   Cardiovascular:  Negative for chest pain, palpitations, orthopnea and claudication.  Gastrointestinal:  Negative for abdominal pain, blood in stool,  constipation, diarrhea, heartburn, melena, nausea and vomiting.  Genitourinary:  Negative for dysuria, flank pain, frequency, hematuria and urgency.  Musculoskeletal:  Negative for back pain, joint pain and myalgias.  Skin:  Negative for rash.  Neurological:  Negative for dizziness, tingling, focal weakness, seizures, weakness and headaches.  Endo/Heme/Allergies:  Does not bruise/bleed easily.  Psychiatric/Behavioral:  Negative for depression and suicidal ideas. The patient does not have insomnia.       Allergies  Allergen Reactions   Propylene Glycol     Skin rash   Hydrocortisone Rash    Allergic to all topical steriods Allergic to all topical corticosteriods   Other Rash    Topical steroid      Past Medical History:  Diagnosis Date   Atopic dermatitis    Basal cell carcinoma    15 years ago.   Dysrhythmia    Hemochromatosis    Heterozygous for two genes   Palpitations    a. 04/2013 48h Holter: sinus rhythm/sinus arrhythmia, rare PAC's/PVC's.   PVC (premature ventricular contraction)    Shoulder pain, left 01/24/2015   Shoulder pain, right 01/24/2015     Past Surgical History:  Procedure Laterality Date   BREAST BIOPSY Right 09/25/2021   BREAST LUMPECTOMY WITH RADIOACTIVE SEED AND SENTINEL LYMPH NODE BIOPSY Right 10/28/2021   Procedure: RIGHT BREAST LUMPECTOMY WITH RADIOACTIVE SEED AND AXILLARY SENTINEL LYMPH NODE BIOPSY;  Surgeon: Rolm Bookbinder, MD;  Location: [place];  Service: General;  Laterality: Right;   COLONOSCOPY WITH PROPOFOL N/A 01/24/2021   Procedure: COLONOSCOPY WITH PROPOFOL;  Surgeon: [name], MD;  Location: [facility];  Service: Endoscopy;  Laterality: N/A;   Excision of basal cell carcinoma     PORTACATH PLACEMENT Left 11/19/2021   Procedure: INSERTION PORT-A-CATH;  Surgeon: Rolm Bookbinder, MD;  Location: [place];  Service: General;  Laterality: Left;   WISDOM TOOTH EXTRACTION      Social History    Socioeconomic History   Marital status: Married    Spouse name: Not on file   Number of children: 1   Years of education: Not on file   Highest education level: Not on file  Occupational History   Occupation: Dietitian  Tobacco Use   Smoking status: Never   Smokeless tobacco: Never  Vaping Use   Vaping Use: Never used  Substance and Sexual Activity   Alcohol use: Yes    Alcohol/week: 2.0 standard drinks of alcohol    Types: 2 Cans of beer per week    Comment: 2-4 beer/wine aweek.   Drug use: No   Sexual activity: Yes    Partners: Male  Other Topics Concern   Not on file  Social History Narrative   Married      Daughter -23 yrs.       Dietitian for [employer]   Social Determinants of Health   Financial Resource Strain: Not on file  Food Insecurity: Not on file  Transportation Needs: Not on file  Physical Activity: Inactive (05/15/2017)   Exercise Vital Sign    Days of Exercise per Week: 0 days    Minutes of Exercise per Session: 0 min  Stress: No Stress Concern Present (05/15/2017)   [place]    Feeling of Stress : Only a little  Social Connections: Not on file  Intimate Partner Violence: Not on file    Family History  Problem Relation Age of Onset   Hemochromatosis Mother    Basal cell carcinoma Mother    Hypertension Father    Melanoma Father        multiple removed   Colon polyps Father 27       adenomatous   Breast cancer Paternal Aunt 13   Kidney cancer Paternal Aunt    Alzheimer's disease Paternal Aunt    Hyperlipidemia Maternal Grandmother    Hypertension Maternal Grandmother    Osteoporosis Maternal Grandmother    Hyperlipidemia Maternal Grandfather    Hypertension Maternal Grandfather    Diabetes Maternal Grandfather    Stroke Maternal Grandfather    Alzheimer's disease Paternal Grandmother    Breast cancer Other        late years   Breast cancer Other        late years   Colon  cancer Neg Hx      Current Outpatient Medications:    acetaminophen (TYLENOL) 500 MG tablet, Take 1,000 mg by mouth every 8 (eight) hours as needed for moderate pain., Disp: , Rfl:    betamethasone dipropionate 0.05 % cream, Apply topically 2 (two) times daily., Disp: 30 g, Rfl: 0   cholecalciferol (VITAMIN D) 1000 units tablet, Take 1,000 Units by mouth daily., Disp: , Rfl:    Cyanocobalamin (VITAMIN B 12 PO), Take 1,000 mcg by mouth daily., Disp: , Rfl:    dexamethasone (DECADRON) 4 MG tablet, Take 2 tabs by mouth 2 times daily starting day before chemo. Then take 2 tabs daily for 2 days starting day after chemo. Take with food., Disp: 30 tablet, Rfl: 1   famotidine (PEPCID) 20 MG tablet, Take 20 mg by mouth 2 (two)  times daily., Disp: , Rfl:    levonorgestrel-ethinyl estradiol (AVIANE) 0.1-20 MG-MCG tablet, Take 1 tablet by mouth daily., Disp: 84 tablet, Rfl: 3   lidocaine-prilocaine (EMLA) cream, Apply to affected area once, Disp: 30 g, Rfl: 3   loratadine (CLARITIN) 10 MG tablet, Take 10 mg by mouth daily., Disp: , Rfl:    propranolol (INDERAL) 20 MG tablet, Take 1 tablet (20 mg total) by mouth daily., Disp: 90 tablet, Rfl: 3   dexamethasone (DECADRON) 4 MG tablet, Take 1 tablet (4 mg total) by mouth 2 (two) times daily with a meal. (Patient not taking: Reported on 01/08/2022), Disp: 14 tablet, Rfl: 2   ibuprofen (ADVIL,MOTRIN) 200 MG tablet, Take 400 mg by mouth every 8 (eight) hours as needed for moderate pain. (Patient not taking: Reported on 01/08/2022), Disp: , Rfl:    loperamide (IMODIUM) 2 MG capsule, Take by mouth as needed for diarrhea or loose stools. (Patient not taking: Reported on 01/08/2022), Disp: , Rfl:    ondansetron (ZOFRAN) 8 MG tablet, Take 1 tablet (8 mg total) by mouth every 8 (eight) hours as needed for nausea or vomiting. Start on the third day after chemotherapy. (Patient not taking: Reported on 01/08/2022), Disp: 30 tablet, Rfl: 1   prochlorperazine (COMPAZINE) 10 MG  tablet, Take 1 tablet (10 mg total) by mouth every 6 (six) hours as needed for nausea or vomiting. (Patient not taking: Reported on 01/08/2022), Disp: 30 tablet, Rfl: 1   tacrolimus (PROTOPIC) 0.1 % ointment, Apply to affected areas twice daily as needed (Patient not taking: Reported on 01/08/2022), Disp: 60 g, Rfl: 2   traMADol (ULTRAM) 50 MG tablet, Take 1 tablet (50 mg total) by mouth every 6 (six) hours as needed. (Patient not taking: Reported on 01/08/2022), Disp: 10 tablet, Rfl: 0 No current facility-administered medications for this visit.  Facility-Administered Medications Ordered in Other Visits:    0.9 % NaCl with KCl 20 mEq/ L  infusion, , Intravenous, Once, [name], MD   heparin lock flush 100 unit/mL, 500 Units, Intracatheter, Once PRN, [name], MD  Physical exam:  Vitals:   01/29/22 0907  BP: (!) 141/93  Pulse: 83  Resp: 16  Temp: 98.6 F (37 C)  TempSrc: Tympanic  SpO2: 100%  Weight: 137 lb 12.8 oz (62.5 kg)  Height: _0  (1.676 m)   Physical Exam Cardiovascular:     Rate and Rhythm: Normal rate and regular rhythm.     Heart sounds: Normal heart sounds.  Pulmonary:     Effort: Pulmonary effort is normal.     Breath sounds: Normal breath sounds.  Abdominal:     General: Bowel sounds are normal.     Palpations: Abdomen is soft.  Skin:    General: Skin is warm and dry.  Neurological:     Mental Status: She is alert and oriented to person, place, and time.         Latest Ref Rng & Units 01/29/2022    8:54 AM  CMP  Glucose 70 - 99 mg/dL 99   BUN 6 - 20 mg/dL 14   Creatinine 0.44 - 1.00 mg/dL 0.56   Sodium 135 - 145 mmol/L 141   Potassium 3.5 - 5.1 mmol/L 3.7   Chloride 98 - 111 mmol/L 110   CO2 22 - 32 mmol/L 26   Calcium 8.9 - 10.3 mg/dL 9.2   Total Protein 6.5 - 8.1 g/dL 6.5   Total Bilirubin 0.3 - 1.2 mg/dL 0.4  Alkaline Phos 38 - 126 U/L 70   AST 15 - 41 U/L 26   ALT 0 - 44 U/L 36       Latest Ref Rng & Units 01/29/2022    8:54  AM  CBC  WBC 4.0 - 10.5 K/uL 11.2   Hemoglobin 12.0 - 15.0 g/dL 11.6   Hematocrit 36.0 - 46.0 % 34.4   Platelets 150 - 400 K/uL 326     Assessment and plan- Patient is a 46 y.o. female with pathological prognostic stage Ia invasive mammary carcinoma of the right breast's pT1b N0 M0 ER 51 to 90% positive PR 51 to 90% positive and HER2 negative.  She is here for on treatment assessment prior to cycle 4 of adjuvant TC chemotherapy  Counts okay to proceed with adjuvant TC chemotherapy today with Udenyca on day 3.  This will be her last chemotherapy.  She will reach out to Dr. Donne Hazel to get her port taken out after couple of weeks.  She is also meeting Dr. Isidore Moos early next month to start adjuvant radiation therapy.  I am asking pathology to addend her ER and PR report to reflect in 10% increments.  Given that she has an ER/PR positive tumor adjuvant endocrine therapy would be recommended upon completion of radiation treatment.  Her Oncotype score was high intermediate risk of 23 requiring adjuvant chemotherapy.  I therefore recommend ovarian suppression plus AI instead of tamoxifen alone for endocrine therapy given that she is premenopausal.  Discussed risks and benefits of both ovarian suppression and AI including all but not limited to hot flashes, mood swings, arthralgias headaches and worsening bone health.  We will start off with monthly Zoladex in early January and after about 2-3 doses of Zoladex she will start taking Arimidex.  Patient has not had any menstrual cycles while on chemotherapy.  I will also obtain baseline bone density scan  Patient would also qualify for adjuvant Zometa given high risk breast cancer and ongoing ovarian suppression.  We could do it either every 3 months for 2 years or every 6 months for 3 years.  Discussed risks and benefits of Zometa including all but not limited to hypocalcemia and osteonecrosis of the jaw.  We will obtain dental clearance prior to starting  Zometa.  Treatment will be given with a curative intent  I will see her back tentatively in 2 months time for her second dose of Zoladex and plan on giving Zometa at that time as well.   Visit Diagnosis 1. Encounter for antineoplastic chemotherapy   2. Malignant neoplasm of lower-inner quadrant of right breast of female, estrogen receptor positive ([place])      Dr. Randa Evens, MD, MPH [facility] at [facility] [phone] 01/29/2022 12:50 PM

## 2022-01-30 NOTE — Progress Notes (Signed)
Adrienne Harris presents today to consult with Dr. Isidore Moos for radiation treatment for right breast cancer.    Location of Breast Cancer:  Malignant neoplasm of lower-inner quadrant of right breast of female, estrogen receptor positive   Histology per Pathology Report:  10/28/2021 A. RIGHT BREAST, LUMPECTOMY:  - Invasive moderately differentiated adenocarcinoma, grade 2 (3+3+1)  - Tumor measures 0.7 x 0.6 x 0.6 cm (pT1b)  - Ductal carcinoma in situ, intermediate to high nuclear grade, solid type with focal necrosis  - Margins free of carcinoma  - DCIS 0.35 mm from posterior margin  - Invasive carcinoma 1 mm from posterior margin  - Changes consistent with prior biopsy  B. RIGHT AXILLARY SENTINEL LYMPH NODE, EXCISION:  - One benign lymph node, negative for carcinoma (0/1)  C. RIGHT AXILLARY SENTINEL LYMPH NODE, EXCISION:  - One benign lymph node, negative for carcinoma (0/1)  D. RIGHT AXILLARY SENTINEL LYMPH NODE, EXCISION:  - One benign lymph node, negative for carcinoma (0/1)  E. RIGHT AXILLARY SENTINEL LYMPH NODE, EXCISION:  - One benign lymph node, negative for carcinoma (0/1)  F. RIGHT AXILLARY SENTINEL LYMPH NODE, EXCISION:  - One benign lymph node, negative for carcinoma (0/1)  G. RIGHT BREAST, ADDITIONAL MEDIAL MARGJIN, EXCISION:  - Benign breast with fibrocystic changes including stromal fibrosis, cystic dilatation of ducts, adenosis and usual duct hyperplasia Negative for carcinoma  H. RIGHT BREAST, ADDITIONAL POSTERIOR MARGJIN, EXCISION:  - Focal ductal carcinoma in situ, intermediate nuclear grade, solid type  without necrosis  - Negative for invasive carcinoma  - True margin free of carcinoma  - DCIS 2 mm from true margin  - Benign breast with fibrocystic changes including stromal fibrosis, adenosis and cystic papillary apocrine metaplasia    Receptor Status: ER(51-90%), PR (51-90%), Her2-neu (Negative via IHC), Ki-67(Not performed)   Did patient present with symptoms (if  so, please note symptoms) or was this found on screening mammography?: (from Dr. Elroy Channel last office note): "Patient underwent lateral screening mammogram which showed a possible area of concern in the right breast.  This was followed by diagnostic mammogram and ultrasound which showed a 7 mm mass in the right lower breast.  No suspicious right axillary adenopathy"   Past/Anticipated interventions by surgeon, if any:  10/28/2021 --Dr. Rolm Bookbinder Left breast radioactive seed guided lumpectomy Left deep axillary sentinel lymph node biopsy Injection of mag trace for sentinel lymph node identification   Past/Anticipated interventions by medical oncology, if any:  Under care of Dr. Randa Evens 10/01/2021 At this time I would recommend upfront lumpectomy with a sentinel lymph node biopsy.   Given that she has a grade 3 tumor more than 5 mm she would require Oncotype testing on either her biopsy specimen on her final pathology specimen based on the final size.   I will discuss with her in greater detail after her Oncotype testing back There would also be role for adjuvant radiation therapy after lumpectomy depending on whether she needs chemotherapy or not.  Also given the fact that she has ER/PR positive tumor there would be role for hormone therapy.  Given that she is premenopausal she would likely need to take that for 10 years.  Treatment will be given with a curative intent. In terms of future birth control I would recommend copper IUD which is a nonhormonal measure over any hormonal methods of contraception. Patient will be seeing Dr. Donne Hazel to discuss lumpectomy in more detail and I will see her back after final pathology and Oncotype testing  results are back We are also sending blood work out for genetic testing today and subsequently patient will be seen by genetic counseling   Assessment and plan- Patient is a 46 y.o. female with pathological prognostic stage Ia invasive mammary  carcinoma of the right breast's pT1b N0 M0 ER 51 to 90% positive PR 51 to 90% positive and HER2 negative.  She is here for on treatment assessment prior to cycle 4 of adjuvant TC chemotherapy   Counts okay to proceed with adjuvant TC chemotherapy today with Udenyca on day 3.  This will be her last chemotherapy.  She will reach out to Dr. Donne Hazel to get her port taken out after couple of weeks.  She is also meeting Dr. Isidore Moos early next month to start adjuvant radiation therapy.   I am asking pathology to addend her ER and PR report to reflect in 10% increments.  Given that she has an ER/PR positive tumor adjuvant endocrine therapy would be recommended upon completion of radiation treatment.  Her Oncotype score was high intermediate risk of 23 requiring adjuvant chemotherapy.  I therefore recommend ovarian suppression plus AI instead of tamoxifen alone for endocrine therapy given that she is premenopausal.   Discussed risks and benefits of both ovarian suppression and AI including all but not limited to hot flashes, mood swings, arthralgias headaches and worsening bone health.  We will start off with monthly Zoladex in early January and after about 2-3 doses of Zoladex she will start taking Arimidex.  Patient has not had any menstrual cycles while on chemotherapy.  I will also obtain baseline bone density scan   Patient would also qualify for adjuvant Zometa given high risk breast cancer and ongoing ovarian suppression.  We could do it either every 3 months for 2 years or every 6 months for 3 years.  Discussed risks and benefits of Zometa including all but not limited to hypocalcemia and osteonecrosis of the jaw.  We will obtain dental clearance prior to starting Zometa.  Treatment will be given with a curative intent   I will see her back tentatively in 2 months time for her second dose of Zoladex and plan on giving Zometa at that time as well.   Visit Diagnosis 1. Encounter for antineoplastic  chemotherapy   2. Malignant neoplasm of lower-inner quadrant of right breast of female, estrogen receptor positive ([place])         Dr. Randa Evens, MD, MPH [facility] at [facility] [phone] 01/29/2022 12:50 PM   Lymphedema issues, if any:  Some     Pain issues, if any:  tender under arm   SAFETY ISSUES: Prior radiation? No Pacemaker/ICD? No Possible current pregnancy? No--LMP: 10/02/2021 Is the patient on methotrexate? No   Current Complaints / other details:  wants to know side effects, no major concerns at this time  Vitals:   02/12/22 0837  BP: 118/80  Pulse: 90  Resp: 18  Temp: 97.8 F (36.6 C)  SpO2: 100%

## 2022-01-31 ENCOUNTER — Inpatient Hospital Stay: Payer: 59

## 2022-01-31 DIAGNOSIS — C50311 Malignant neoplasm of lower-inner quadrant of right female breast: Secondary | ICD-10-CM

## 2022-01-31 DIAGNOSIS — Z170 Estrogen receptor positive status [ER+]: Secondary | ICD-10-CM | POA: Diagnosis not present

## 2022-01-31 DIAGNOSIS — Z5111 Encounter for antineoplastic chemotherapy: Secondary | ICD-10-CM | POA: Diagnosis not present

## 2022-01-31 DIAGNOSIS — Z5189 Encounter for other specified aftercare: Secondary | ICD-10-CM | POA: Diagnosis not present

## 2022-01-31 MED ORDER — PEGFILGRASTIM-CBQV 6 MG/0.6ML ~~LOC~~ SOSY
6.0000 mg | PREFILLED_SYRINGE | Freq: Once | SUBCUTANEOUS | Status: AC
  Administered 2022-01-31: 6 mg via SUBCUTANEOUS
  Filled 2022-01-31: qty 0.6

## 2022-02-05 ENCOUNTER — Other Ambulatory Visit: Payer: Self-pay | Admitting: *Deleted

## 2022-02-11 ENCOUNTER — Encounter: Payer: Self-pay | Admitting: Oncology

## 2022-02-11 NOTE — Progress Notes (Signed)
Radiation Oncology         [phone] ________________________________  Name: Adrienne Harris MRN: [id number]  Date: 02/12/2022  DOB: 07-23-1975  Follow-Up Visit Note  Outpatient  CC: [name], FNP  [name], MD  Diagnosis:      ICD-10-CM   1. Malignant neoplasm of lower-inner quadrant of right breast of female, estrogen receptor positive ([place])  C50.311 Pregnancy, urine   Z17.0      S/p lumpectomy and SLN biopsies: Right Breast LIQ, Invasive ductal carcinoma and intermediate to high-grade DCIS, ER+ / PR+ / Her2- by IHC, Grade 2    Cancer Staging  Malignant neoplasm of lower-inner quadrant of right breast of female, estrogen receptor positive ([place]) Staging form: Breast, AJCC 8th Edition - Clinical stage from 10/01/2021: Stage IA (cT1b, cN0, cM0, G3, ER+, PR+, HER2-) - Signed by [name], MD on 10/01/2021 - Pathologic stage from 11/14/2021: Stage IA (pT1b, pN0, cM0, G2, ER+, PR+, HER2-, Oncotype DX score: 23) - Signed by [name], MD on 11/14/2021   CHIEF COMPLAINT: Here to discuss management of right breast cancer  Narrative / Interval History 02/12/22:  The patient returns today for follow-up.     Since consultation date of 11/05/21, she underwent the following imaging (dates and results as follows):  -- DXA for bone mineral density on 01/29/22 showed an AP spine t-score of -1.7, classifying the patient as osteopenic.    Oncotype DX was obtained from the final surgical sample and the recurrence score of 23 predicts a risk of recurrence outside the breast over the next 9 years of 9%, if the patient's only systemic therapy is an antiestrogen for 5 years.  It also predicted an approximately 6.5 % benefit from chemotherapy.  Systemic therapy, if applicable, involved (dates and therapy as follows): The patient has been treated with adjuvant chemotherapy consisting of TC x 4 cycles from 11/20/21 through 01/29/22 under the care of Dr. Janese Banks. The patient tolerated  systemic treatment well overall other than some trouble sleeping and a skin rash mainly over her bilateral thighs which has improved. She was on an oral steroid course for this which she completed in mid-November.  Moving forward, Dr. Janese Banks recommends ovarian suppression plus AI instead of tamoxifen alone for endocrine therapy given that she is premenopausal. This will consist of monthly Zoladex in early January and after about 2-3 doses of Zoladex she will start taking Arimidex. The patient also qualifies for Zometa. Dr. Janese Banks has arranged for her to obtain dental clearance prior to starting Zometa.    Symptomatically, the patient reports: ***  HPI Initial Consultation 11/05/21 :: Adrienne Harris is a 47 y.o. female who presented with a right breast abnormality on the following imaging: bilateral screening mammogram on the date of 08/30/21.  No symptoms, if any, were reported at that time. Diagnostic right breast mammogram and right breast ultrasound on 09/18/21 revealed a mass in the 6 o'clock right breast, 3 cmfn, concerning for malignancy, measuring 7 x 6 by 7 mm. No suspicious right axillary adenopathy was appreciated.    Biopsy of the right breast mass on date of 09/25/21 showed grade 3 invasive ductal carcinoma measuring 6 mm in the greatest linear extent of the sample with DCIS.  ER status: 51-90% positive with strong staining intensity; PR status 51-90% positive with moderate staining intensity; Her2 status negative by IHC; Grade 3. No lymph nodes were examined   Accordingly, the patient was referred to Dr. Janese Banks, [facility]  Medical Oncology, on 10/01/21 who recommended proceeding with breast conserving surgery. Dr. Janese Banks also recommended Oncotype testing on the final surgical sample given her high grade tumor and tumor size. Given her ER/PR positive tumor and pre-menopausal status, she was also recommended hormone therapy for 10 years following adjuvant treatment (if indicated).     [employer] genetic testing  performed on 10/01/21 showed no pathogenic variants. However, a variant of uncertain significance was detected ([id number]) in the APC gene. Of note: patient has a family history significant for breast cancer in her paternal aunt and maternal great aunt. Her father also has a history of melanoma.    The patient was referred to general surgery and opted to proceed with right breast lumpectomy with SLN biopsies on 10/28/21 under the care of Dr. Donne Hazel. Pathology from the procedure revealed: tumor the size of 0.7 x 0.6 x 0.6 cm; histology of grade 2 invasive ductal carcinoma and intermediate to high grade DCIS with focal necrosis; all margins negative for both invasive and in-situ disease; margin status to invasive disease of 3 mm from the final posterior margin; margin status to in-situ disease of 2 mm from the final posterior margin; nodal status of 5/5 right axillary sentinel lymph node excisions negative for carcinoma. ER status: 51-90% positive with strong staining intensity; PR status 51-90% positive with moderate staining intensity; Her2 status negative by IHC; Grade 2.    Oncotype testing has been ordered and is pending at this time.    She is in her usual state of health.  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  She is here with her husband today who appears to be very supportive.  She denies any fevers or chills.        ALLERGIES:  is allergic to propylene glycol, hydrocortisone, and other.  Meds: Current Outpatient Medications  Medication Sig Dispense Refill   acetaminophen (TYLENOL) 500 MG tablet Take 1,000 mg by mouth every 8 (eight) hours as needed for moderate pain.     betamethasone dipropionate 0.05 % cream Apply topically 2 (two) times daily. 30 g 0   cholecalciferol (VITAMIN D) 1000 units tablet Take 1,000 Units by mouth daily.     Cyanocobalamin (VITAMIN B 12 PO) Take 1,000 mcg by mouth daily.     dexamethasone (DECADRON) 4 MG tablet Take 2 tabs  by mouth 2 times daily starting day before chemo. Then take 2 tabs daily for 2 days starting day after chemo. Take with food. 30 tablet 1   dexamethasone (DECADRON) 4 MG tablet Take 1 tablet (4 mg total) by mouth 2 (two) times daily with a meal. (Patient not taking: Reported on 01/08/2022) 14 tablet 2   famotidine (PEPCID) 20 MG tablet Take 20 mg by mouth 2 (two) times daily.     ibuprofen (ADVIL,MOTRIN) 200 MG tablet Take 400 mg by mouth every 8 (eight) hours as needed for moderate pain. (Patient not taking: Reported on 01/08/2022)     levonorgestrel-ethinyl estradiol (AVIANE) 0.1-20 MG-MCG tablet Take 1 tablet by mouth daily. 84 tablet 3   lidocaine-prilocaine (EMLA) cream Apply to affected area once 30 g 3   loperamide (IMODIUM) 2 MG capsule Take by mouth as needed for diarrhea or loose stools. (Patient not taking: Reported on 01/08/2022)     loratadine (CLARITIN) 10 MG tablet Take 10 mg by mouth daily.     ondansetron (ZOFRAN) 8 MG tablet Take 1 tablet (8 mg total) by mouth every 8 (eight) hours  as needed for nausea or vomiting. Start on the third day after chemotherapy. (Patient not taking: Reported on 01/08/2022) 30 tablet 1   prochlorperazine (COMPAZINE) 10 MG tablet Take 1 tablet (10 mg total) by mouth every 6 (six) hours as needed for nausea or vomiting. (Patient not taking: Reported on 01/08/2022) 30 tablet 1   propranolol (INDERAL) 20 MG tablet Take 1 tablet (20 mg total) by mouth daily. 90 tablet 3   tacrolimus (PROTOPIC) 0.1 % ointment Apply to affected areas twice daily as needed (Patient not taking: Reported on 01/08/2022) 60 g 2   traMADol (ULTRAM) 50 MG tablet Take 1 tablet (50 mg total) by mouth every 6 (six) hours as needed. (Patient not taking: Reported on 01/08/2022) 10 tablet 0   No current facility-administered medications for this encounter.    Physical Findings:  vitals were not taken for this visit. .     General: Alert and oriented, in no acute distress HEENT: Head is  normocephalic. Extraocular movements are intact. Oropharynx is clear. Neck: Neck is supple, no palpable cervical or supraclavicular lymphadenopathy. Heart: Regular in rate and rhythm with no murmurs, rubs, or gallops. Chest: Clear to auscultation bilaterally, with no rhonchi, wheezes, or rales. Abdomen: Soft, nontender, nondistended, with no rigidity or guarding. Extremities: No cyanosis or edema. Lymphatics: see Neck Exam Musculoskeletal: symmetric strength and muscle tone throughout. Neurologic: No obvious focalities. Speech is fluent.  Psychiatric: Judgment and insight are intact. Affect is appropriate. Breast exam reveals ***  Lab Findings: Lab Results  Component Value Date   WBC 11.2 (H) 01/29/2022   HGB 11.6 (L) 01/29/2022   HCT 34.4 (L) 01/29/2022   MCV 99.1 01/29/2022   PLT 326 01/29/2022    _0 @  Radiographic Findings: DG Bone Density  Result Date: 01/29/2022 EXAM: DUAL X-RAY ABSORPTIOMETRY (DXA) FOR BONE MINERAL DENSITY IMPRESSION: Your patient Sarh Kirschenbaum completed a BMD test on 01/29/2022 using the [place] (software version: 14.10) manufactured by [employer]. The following summarizes the results of our evaluation. Technologist::TNB PATIENT BIOGRAPHICAL: Name: Albert, Devaul Patient ID: [id number] Birth Date: 07-May-1975 Height: 66.0 in. Gender: Female Exam Date: 01/29/2022 Weight: 137.8 lbs. Indications: Breast CA, Caucasian, Chemo, Long Term Prednisone Use, Low Calcium Intake, Vitamin D Deficiency Fractures: Treatments: Dexamethasone, Vitamin D DENSITOMETRY RESULTS: Site      Region    Measured Date Measured Age WHO Classification Young Adult T-score BMD         %Change vs. Previous Significant Change (*) AP Spine L1-L4 01/29/2022 46.0 Osteopenia -1.7 0.979 g/cm2 - - DualFemur Neck Left 01/29/2022 46.0 Osteopenia -1.4 0.840 g/cm2 - - ASSESSMENT: The BMD measured at AP Spine L1-L4 is 0.979 g/cm2 with a T-score of -1.7. This patient is  considered osteopenic according to [facility]) criteria. The scan quality is good. World Pharmacologist [facility]) criteria for post-menopausal, Caucasian Women: Normal:                   T-score at or above -1 SD Osteopenia/low bone mass: T-score between -1 and -2.5 SD Osteoporosis:             T-score at or below -2.5 SD RECOMMENDATIONS: 1. All patients should optimize calcium and vitamin D intake. 2. Consider FDA-approved medical therapies in postmenopausal women and men aged 45 years and older, based on the following: a. A hip or vertebral(clinical or morphometric) fracture b. T-score < -2.5 at the femoral neck or spine after appropriate evaluation to exclude  secondary causes c. Low bone mass (T-score between -1.0 and -2.5 at the femoral neck or spine) and a 10-year probability of a hip fracture > 3% or a 10-year probability of a major osteoporosis-related fracture > 20% based on the [place]-adapted WHO algorithm 3. Clinician judgment and/or patient preferences may indicate treatment for people with 10-year fracture probabilities above or below these levels FOLLOW-UP: People with diagnosed cases of osteoporosis or at high risk for fracture should have regular bone mineral density tests. For patients eligible for Medicare, routine testing is allowed once every 2 years. The testing frequency can be increased to one year for patients who have rapidly progressing disease, those who are receiving or discontinuing medical therapy to restore bone mass, or have additional risk factors. I have reviewed this report, and agree with the above findings. [facility] Dear Weston Anna RAO, Your patient HORTENCIA MARTIRE completed a FRAX assessment on 01/29/2022 using the [place] (analysis version: 14.10) manufactured by [employer]. The following summarizes the results of our evaluation. PATIENT BIOGRAPHICAL: Name: Bleu, Moisan Patient ID: [id number] Birth Date: 1975-05-26 Height:    66.0  in. Gender:     Female    [age]       Weight:    137.8 lbs. Ethnicity:  White                            Exam Date: 01/29/2022 FRAX* RESULTS:  (version: 3.5) 10-year Probability of Fracture1 Major Osteoporotic Fracture2 Hip Fracture 3.1% 0.3% Population: [place] (Caucasian) Risk Factors: None Based on Femur (Left) Neck BMD 1 -The 10-year probability of fracture may be lower than reported if the patient has received treatment. 2 -Major Osteoporotic Fracture: Clinical Spine, Forearm, Hip or Shoulder *FRAX is a Materials engineer of the [employer] for Metabolic Bone Disease, a [place] (WHO) [facility]. ASSESSMENT: The probability of a major osteoporotic fracture is 3.1% within the next ten years. The probability of a hip fracture is 0.3% within the next ten years. Electronically Signed   By: Ammie Ferrier M.D.   On: 01/29/2022 14:49    Impression/Plan: We discussed adjuvant radiotherapy today.  I recommend *** in order to ***.  I reviewed the logistics, benefits, risks, and potential side effects of this treatment in detail. Risks may include but not necessary be limited to acute and late injury tissue in the radiation fields such as skin irritation (change in color/pigmentation, itching, dryness, pain, peeling). She may experience fatigue. We also discussed possible risk of long term cosmetic changes or scar tissue. There is also a smaller risk for lung toxicity, ***cardiac toxicity, ***brachial plexopathy, ***lymphedema, ***musculoskeletal changes, ***rib fragility or ***induction of a second malignancy, ***late chronic non-healing soft tissue wound.    The patient asked good questions which I answered to her satisfaction. She is enthusiastic about proceeding with treatment. A consent form has been *** signed and placed in her chart.  A total of *** medically necessary complex treatment devices will be fabricated and supervised by me: ***  fields with MLCs for custom blocks to protect heart, and lungs;  and, a Vac-lok. MORE COMPLEX DEVICES MAY BE MADE IN DOSIMETRY FOR FIELD IN FIELD BEAMS FOR DOSE HOMOGENEITY.  I have requested : 3D Simulation which is medically necessary to give adequate dose to at risk tissues while sparing lungs and heart.  I  have requested a DVH of the following structures: lungs, heart, *** lumpectomy cavity.    The patient will receive *** Gy in *** fractions to the *** with *** fields.  This will be *** followed by a boost.  On date of service, in total, I spent *** minutes on this encounter. Patient was seen in person.  _____________________________________   Eppie Gibson, MD  This document serves as a record of services personally performed by Eppie Gibson, MD. It was created on her behalf by Roney Mans, a trained medical scribe. The creation of this record is based on the scribe's personal observations and the provider's statements to them. This document has been checked and approved by the attending provider.

## 2022-02-12 ENCOUNTER — Other Ambulatory Visit: Payer: Self-pay

## 2022-02-12 ENCOUNTER — Encounter: Payer: Self-pay | Admitting: *Deleted

## 2022-02-12 ENCOUNTER — Encounter: Payer: Self-pay | Admitting: Oncology

## 2022-02-12 ENCOUNTER — Ambulatory Visit: Payer: 59

## 2022-02-12 ENCOUNTER — Ambulatory Visit
Admission: RE | Admit: 2022-02-12 | Discharge: 2022-02-12 | Disposition: A | Discharge status: Home / Self Care | Payer: 59 | Source: Ambulatory Visit | Attending: Family | Admitting: Family

## 2022-02-12 ENCOUNTER — Encounter: Payer: Self-pay | Admitting: Radiation Oncology

## 2022-02-12 ENCOUNTER — Ambulatory Visit
Admission: RE | Admit: 2022-02-12 | Discharge: 2022-02-12 | Disposition: A | Discharge status: Home / Self Care | Payer: 59 | Source: Ambulatory Visit | Attending: Radiation Oncology | Admitting: Radiation Oncology

## 2022-02-12 VITALS — BP 118/80 | HR 90 | Temp 97.8°F | Resp 18 | Ht 66.0 in | Wt 138.6 lb

## 2022-02-12 DIAGNOSIS — Z808 Family history of malignant neoplasm of other organs or systems: Secondary | ICD-10-CM | POA: Diagnosis not present

## 2022-02-12 DIAGNOSIS — C50311 Malignant neoplasm of lower-inner quadrant of right female breast: Secondary | ICD-10-CM | POA: Diagnosis not present

## 2022-02-12 DIAGNOSIS — Z803 Family history of malignant neoplasm of breast: Secondary | ICD-10-CM | POA: Diagnosis not present

## 2022-02-12 DIAGNOSIS — Z7952 Long term (current) use of systemic steroids: Secondary | ICD-10-CM | POA: Insufficient documentation

## 2022-02-12 DIAGNOSIS — M8588 Other specified disorders of bone density and structure, other site: Secondary | ICD-10-CM | POA: Insufficient documentation

## 2022-02-12 DIAGNOSIS — Z79811 Long term (current) use of aromatase inhibitors: Secondary | ICD-10-CM | POA: Insufficient documentation

## 2022-02-12 DIAGNOSIS — Z170 Estrogen receptor positive status [ER+]: Secondary | ICD-10-CM

## 2022-02-12 DIAGNOSIS — Z793 Long term (current) use of hormonal contraceptives: Secondary | ICD-10-CM | POA: Diagnosis not present

## 2022-02-12 DIAGNOSIS — Z79899 Other long term (current) drug therapy: Secondary | ICD-10-CM | POA: Diagnosis not present

## 2022-02-12 LAB — PREGNANCY, URINE: Preg Test, Ur: NEGATIVE

## 2022-02-13 ENCOUNTER — Encounter: Payer: Self-pay | Admitting: Oncology

## 2022-02-13 ENCOUNTER — Inpatient Hospital Stay: Payer: 59 | Attending: Oncology

## 2022-02-13 VITALS — BP 120/74 | HR 85

## 2022-02-13 DIAGNOSIS — Z79818 Long term (current) use of other agents affecting estrogen receptors and estrogen levels: Secondary | ICD-10-CM | POA: Diagnosis not present

## 2022-02-13 DIAGNOSIS — C50311 Malignant neoplasm of lower-inner quadrant of right female breast: Secondary | ICD-10-CM | POA: Insufficient documentation

## 2022-02-13 MED ORDER — GOSERELIN ACETATE 3.6 MG ~~LOC~~ IMPL
3.6000 mg | DRUG_IMPLANT | SUBCUTANEOUS | Status: DC
  Administered 2022-02-13: 3.6 mg via SUBCUTANEOUS
  Filled 2022-02-13: qty 3.6

## 2022-02-17 ENCOUNTER — Telehealth: Payer: Self-pay | Admitting: *Deleted

## 2022-02-17 ENCOUNTER — Encounter: Payer: Self-pay | Admitting: Oncology

## 2022-02-17 DIAGNOSIS — C50311 Malignant neoplasm of lower-inner quadrant of right female breast: Secondary | ICD-10-CM | POA: Diagnosis not present

## 2022-02-17 DIAGNOSIS — Z170 Estrogen receptor positive status [ER+]: Secondary | ICD-10-CM | POA: Diagnosis not present

## 2022-02-17 NOTE — Telephone Encounter (Signed)
Called the [facility] [place]  [facility] and they will send a message to get port out. I sent request to Dr . Donne Hazel office to take care of and to let pt know what date and time it would be done

## 2022-02-19 ENCOUNTER — Other Ambulatory Visit: Payer: Self-pay

## 2022-02-19 ENCOUNTER — Ambulatory Visit
Admission: RE | Admit: 2022-02-19 | Discharge: 2022-02-19 | Disposition: A | Discharge status: Home / Self Care | Payer: 59 | Source: Ambulatory Visit | Attending: Radiation Oncology | Admitting: Radiation Oncology

## 2022-02-19 DIAGNOSIS — C50311 Malignant neoplasm of lower-inner quadrant of right female breast: Secondary | ICD-10-CM | POA: Diagnosis not present

## 2022-02-19 DIAGNOSIS — Z170 Estrogen receptor positive status [ER+]: Secondary | ICD-10-CM | POA: Diagnosis not present

## 2022-02-19 LAB — RAD ONC ARIA SESSION SUMMARY
Course Elapsed Days: 0
Plan Fractions Treated to Date: 1
Plan Prescribed Dose Per Fraction: 2.67 Gy
Plan Total Fractions Prescribed: 15
Plan Total Prescribed Dose: 40.05 Gy
Reference Point Dosage Given to Date: 2.67 Gy
Reference Point Session Dosage Given: 2.67 Gy
Session Number: 1

## 2022-02-20 ENCOUNTER — Ambulatory Visit
Admission: RE | Admit: 2022-02-20 | Discharge: 2022-02-20 | Disposition: A | Discharge status: Home / Self Care | Payer: 59 | Source: Ambulatory Visit | Attending: Radiation Oncology | Admitting: Radiation Oncology

## 2022-02-20 ENCOUNTER — Other Ambulatory Visit: Payer: Self-pay

## 2022-02-20 DIAGNOSIS — Z510 Encounter for antineoplastic radiation therapy: Secondary | ICD-10-CM | POA: Diagnosis not present

## 2022-02-20 DIAGNOSIS — Z170 Estrogen receptor positive status [ER+]: Secondary | ICD-10-CM | POA: Diagnosis not present

## 2022-02-20 DIAGNOSIS — C50311 Malignant neoplasm of lower-inner quadrant of right female breast: Secondary | ICD-10-CM | POA: Diagnosis not present

## 2022-02-20 LAB — RAD ONC ARIA SESSION SUMMARY
Course Elapsed Days: 1
Plan Fractions Treated to Date: 2
Plan Prescribed Dose Per Fraction: 2.67 Gy
Plan Total Fractions Prescribed: 15
Plan Total Prescribed Dose: 40.05 Gy
Reference Point Dosage Given to Date: 5.34 Gy
Reference Point Session Dosage Given: 2.67 Gy
Session Number: 2

## 2022-02-21 ENCOUNTER — Ambulatory Visit
Admission: RE | Admit: 2022-02-21 | Discharge: 2022-02-21 | Disposition: A | Discharge status: Home / Self Care | Payer: 59 | Source: Ambulatory Visit | Attending: Radiation Oncology | Admitting: Radiation Oncology

## 2022-02-21 ENCOUNTER — Other Ambulatory Visit: Payer: Self-pay

## 2022-02-21 DIAGNOSIS — C50311 Malignant neoplasm of lower-inner quadrant of right female breast: Secondary | ICD-10-CM | POA: Diagnosis not present

## 2022-02-21 DIAGNOSIS — Z170 Estrogen receptor positive status [ER+]: Secondary | ICD-10-CM | POA: Diagnosis not present

## 2022-02-21 DIAGNOSIS — Z510 Encounter for antineoplastic radiation therapy: Secondary | ICD-10-CM | POA: Diagnosis not present

## 2022-02-21 LAB — RAD ONC ARIA SESSION SUMMARY
Course Elapsed Days: 2
Plan Fractions Treated to Date: 3
Plan Prescribed Dose Per Fraction: 2.67 Gy
Plan Total Fractions Prescribed: 15
Plan Total Prescribed Dose: 40.05 Gy
Reference Point Dosage Given to Date: 8.01 Gy
Reference Point Session Dosage Given: 2.67 Gy
Session Number: 3

## 2022-02-24 ENCOUNTER — Ambulatory Visit
Admission: RE | Admit: 2022-02-24 | Discharge: 2022-02-24 | Disposition: A | Discharge status: Home / Self Care | Payer: 59 | Source: Ambulatory Visit | Attending: Radiation Oncology | Admitting: Radiation Oncology

## 2022-02-24 ENCOUNTER — Ambulatory Visit
Admission: RE | Admit: 2022-02-24 | Discharge: 2022-02-24 | Discharge status: Home / Self Care | Payer: 59 | Source: Ambulatory Visit | Attending: Radiation Oncology

## 2022-02-24 ENCOUNTER — Other Ambulatory Visit: Payer: Self-pay

## 2022-02-24 DIAGNOSIS — Z510 Encounter for antineoplastic radiation therapy: Secondary | ICD-10-CM | POA: Diagnosis not present

## 2022-02-24 DIAGNOSIS — C50311 Malignant neoplasm of lower-inner quadrant of right female breast: Secondary | ICD-10-CM | POA: Diagnosis not present

## 2022-02-24 DIAGNOSIS — Z170 Estrogen receptor positive status [ER+]: Secondary | ICD-10-CM | POA: Diagnosis not present

## 2022-02-24 LAB — RAD ONC ARIA SESSION SUMMARY
Course Elapsed Days: 5
Plan Fractions Treated to Date: 4
Plan Prescribed Dose Per Fraction: 2.67 Gy
Plan Total Fractions Prescribed: 15
Plan Total Prescribed Dose: 40.05 Gy
Reference Point Dosage Given to Date: 10.68 Gy
Reference Point Session Dosage Given: 2.67 Gy
Session Number: 4

## 2022-02-24 MED ORDER — ALRA NON-METALLIC DEODORANT (RAD-ONC)
1.0000 | Freq: Once | TOPICAL | Status: AC
  Administered 2022-02-24: 1 via TOPICAL

## 2022-02-24 MED ORDER — RADIAPLEXRX EX GEL
Freq: Once | CUTANEOUS | Status: AC
  Administered 2022-02-24: 1 via TOPICAL

## 2022-02-25 ENCOUNTER — Ambulatory Visit
Admission: RE | Admit: 2022-02-25 | Discharge: 2022-02-25 | Disposition: A | Discharge status: Home / Self Care | Payer: 59 | Source: Ambulatory Visit | Attending: Radiation Oncology | Admitting: Radiation Oncology

## 2022-02-25 ENCOUNTER — Other Ambulatory Visit: Payer: Self-pay

## 2022-02-25 ENCOUNTER — Other Ambulatory Visit: Payer: Self-pay | Admitting: General Surgery

## 2022-02-25 DIAGNOSIS — C50311 Malignant neoplasm of lower-inner quadrant of right female breast: Secondary | ICD-10-CM | POA: Diagnosis not present

## 2022-02-25 DIAGNOSIS — Z510 Encounter for antineoplastic radiation therapy: Secondary | ICD-10-CM | POA: Diagnosis not present

## 2022-02-25 DIAGNOSIS — Z170 Estrogen receptor positive status [ER+]: Secondary | ICD-10-CM | POA: Diagnosis not present

## 2022-02-25 LAB — RAD ONC ARIA SESSION SUMMARY
Course Elapsed Days: 6
Plan Fractions Treated to Date: 5
Plan Prescribed Dose Per Fraction: 2.67 Gy
Plan Total Fractions Prescribed: 15
Plan Total Prescribed Dose: 40.05 Gy
Reference Point Dosage Given to Date: 13.35 Gy
Reference Point Session Dosage Given: 2.67 Gy
Session Number: 5

## 2022-02-26 ENCOUNTER — Other Ambulatory Visit: Payer: Self-pay

## 2022-02-26 ENCOUNTER — Ambulatory Visit
Admission: RE | Admit: 2022-02-26 | Discharge: 2022-02-26 | Disposition: A | Discharge status: Home / Self Care | Payer: 59 | Source: Ambulatory Visit | Attending: Radiation Oncology | Admitting: Radiation Oncology

## 2022-02-26 DIAGNOSIS — C50311 Malignant neoplasm of lower-inner quadrant of right female breast: Secondary | ICD-10-CM | POA: Diagnosis not present

## 2022-02-26 DIAGNOSIS — Z170 Estrogen receptor positive status [ER+]: Secondary | ICD-10-CM | POA: Diagnosis not present

## 2022-02-26 DIAGNOSIS — Z510 Encounter for antineoplastic radiation therapy: Secondary | ICD-10-CM | POA: Diagnosis not present

## 2022-02-26 LAB — RAD ONC ARIA SESSION SUMMARY
Course Elapsed Days: 7
Plan Fractions Treated to Date: 6
Plan Prescribed Dose Per Fraction: 2.67 Gy
Plan Total Fractions Prescribed: 15
Plan Total Prescribed Dose: 40.05 Gy
Reference Point Dosage Given to Date: 16.02 Gy
Reference Point Session Dosage Given: 2.67 Gy
Session Number: 6

## 2022-02-27 ENCOUNTER — Other Ambulatory Visit: Payer: Self-pay

## 2022-02-27 ENCOUNTER — Ambulatory Visit
Admission: RE | Admit: 2022-02-27 | Discharge: 2022-02-27 | Disposition: A | Discharge status: Home / Self Care | Payer: 59 | Source: Ambulatory Visit | Attending: Radiation Oncology | Admitting: Radiation Oncology

## 2022-02-27 DIAGNOSIS — Z170 Estrogen receptor positive status [ER+]: Secondary | ICD-10-CM | POA: Diagnosis not present

## 2022-02-27 DIAGNOSIS — Z510 Encounter for antineoplastic radiation therapy: Secondary | ICD-10-CM | POA: Diagnosis not present

## 2022-02-27 DIAGNOSIS — C50311 Malignant neoplasm of lower-inner quadrant of right female breast: Secondary | ICD-10-CM | POA: Diagnosis not present

## 2022-02-27 LAB — RAD ONC ARIA SESSION SUMMARY
Course Elapsed Days: 8
Plan Fractions Treated to Date: 7
Plan Prescribed Dose Per Fraction: 2.67 Gy
Plan Total Fractions Prescribed: 15
Plan Total Prescribed Dose: 40.05 Gy
Reference Point Dosage Given to Date: 18.69 Gy
Reference Point Session Dosage Given: 2.67 Gy
Session Number: 7

## 2022-02-28 ENCOUNTER — Other Ambulatory Visit: Payer: Self-pay

## 2022-02-28 ENCOUNTER — Ambulatory Visit
Admission: RE | Admit: 2022-02-28 | Discharge: 2022-02-28 | Disposition: A | Discharge status: Home / Self Care | Payer: 59 | Source: Ambulatory Visit | Attending: Radiation Oncology | Admitting: Radiation Oncology

## 2022-02-28 DIAGNOSIS — Z170 Estrogen receptor positive status [ER+]: Secondary | ICD-10-CM | POA: Diagnosis not present

## 2022-02-28 DIAGNOSIS — C50311 Malignant neoplasm of lower-inner quadrant of right female breast: Secondary | ICD-10-CM | POA: Diagnosis not present

## 2022-02-28 DIAGNOSIS — Z510 Encounter for antineoplastic radiation therapy: Secondary | ICD-10-CM | POA: Diagnosis not present

## 2022-02-28 LAB — RAD ONC ARIA SESSION SUMMARY
Course Elapsed Days: 9
Plan Fractions Treated to Date: 8
Plan Prescribed Dose Per Fraction: 2.67 Gy
Plan Total Fractions Prescribed: 15
Plan Total Prescribed Dose: 40.05 Gy
Reference Point Dosage Given to Date: 21.36 Gy
Reference Point Session Dosage Given: 2.67 Gy
Session Number: 8

## 2022-03-03 ENCOUNTER — Other Ambulatory Visit: Payer: Self-pay

## 2022-03-03 ENCOUNTER — Ambulatory Visit
Admission: RE | Admit: 2022-03-03 | Discharge: 2022-03-03 | Disposition: A | Discharge status: Home / Self Care | Payer: 59 | Source: Ambulatory Visit | Attending: Radiation Oncology | Admitting: Radiation Oncology

## 2022-03-03 DIAGNOSIS — Z170 Estrogen receptor positive status [ER+]: Secondary | ICD-10-CM | POA: Diagnosis not present

## 2022-03-03 DIAGNOSIS — Z510 Encounter for antineoplastic radiation therapy: Secondary | ICD-10-CM | POA: Diagnosis not present

## 2022-03-03 DIAGNOSIS — C50311 Malignant neoplasm of lower-inner quadrant of right female breast: Secondary | ICD-10-CM | POA: Diagnosis not present

## 2022-03-03 LAB — RAD ONC ARIA SESSION SUMMARY
Course Elapsed Days: 12
Plan Fractions Treated to Date: 9
Plan Prescribed Dose Per Fraction: 2.67 Gy
Plan Total Fractions Prescribed: 15
Plan Total Prescribed Dose: 40.05 Gy
Reference Point Dosage Given to Date: 24.03 Gy
Reference Point Session Dosage Given: 2.67 Gy
Session Number: 9

## 2022-03-04 ENCOUNTER — Ambulatory Visit
Admission: RE | Admit: 2022-03-04 | Discharge: 2022-03-04 | Disposition: A | Discharge status: Home / Self Care | Payer: 59 | Source: Ambulatory Visit | Attending: Radiation Oncology | Admitting: Radiation Oncology

## 2022-03-04 ENCOUNTER — Other Ambulatory Visit: Payer: Self-pay

## 2022-03-04 DIAGNOSIS — C50311 Malignant neoplasm of lower-inner quadrant of right female breast: Secondary | ICD-10-CM | POA: Diagnosis not present

## 2022-03-04 DIAGNOSIS — Z170 Estrogen receptor positive status [ER+]: Secondary | ICD-10-CM | POA: Diagnosis not present

## 2022-03-04 DIAGNOSIS — Z510 Encounter for antineoplastic radiation therapy: Secondary | ICD-10-CM | POA: Diagnosis not present

## 2022-03-04 LAB — RAD ONC ARIA SESSION SUMMARY
Course Elapsed Days: 13
Plan Fractions Treated to Date: 10
Plan Prescribed Dose Per Fraction: 2.67 Gy
Plan Total Fractions Prescribed: 15
Plan Total Prescribed Dose: 40.05 Gy
Reference Point Dosage Given to Date: 26.7 Gy
Reference Point Session Dosage Given: 2.67 Gy
Session Number: 10

## 2022-03-05 ENCOUNTER — Encounter (HOSPITAL_BASED_OUTPATIENT_CLINIC_OR_DEPARTMENT_OTHER): Payer: Self-pay | Admitting: General Surgery

## 2022-03-05 ENCOUNTER — Ambulatory Visit
Admission: RE | Admit: 2022-03-05 | Discharge: 2022-03-05 | Disposition: A | Discharge status: Home / Self Care | Payer: 59 | Source: Ambulatory Visit | Attending: Radiation Oncology

## 2022-03-05 ENCOUNTER — Other Ambulatory Visit: Payer: Self-pay

## 2022-03-05 DIAGNOSIS — Z170 Estrogen receptor positive status [ER+]: Secondary | ICD-10-CM | POA: Diagnosis not present

## 2022-03-05 DIAGNOSIS — C50311 Malignant neoplasm of lower-inner quadrant of right female breast: Secondary | ICD-10-CM | POA: Diagnosis not present

## 2022-03-05 DIAGNOSIS — Z510 Encounter for antineoplastic radiation therapy: Secondary | ICD-10-CM | POA: Diagnosis not present

## 2022-03-05 LAB — RAD ONC ARIA SESSION SUMMARY
Course Elapsed Days: 14
Plan Fractions Treated to Date: 11
Plan Prescribed Dose Per Fraction: 2.67 Gy
Plan Total Fractions Prescribed: 15
Plan Total Prescribed Dose: 40.05 Gy
Reference Point Dosage Given to Date: 29.37 Gy
Reference Point Session Dosage Given: 2.67 Gy
Session Number: 11

## 2022-03-06 ENCOUNTER — Other Ambulatory Visit: Payer: Self-pay

## 2022-03-06 ENCOUNTER — Ambulatory Visit
Admission: RE | Admit: 2022-03-06 | Discharge: 2022-03-06 | Disposition: A | Discharge status: Home / Self Care | Payer: 59 | Source: Ambulatory Visit | Attending: Radiation Oncology | Admitting: Radiation Oncology

## 2022-03-06 DIAGNOSIS — C50311 Malignant neoplasm of lower-inner quadrant of right female breast: Secondary | ICD-10-CM | POA: Diagnosis not present

## 2022-03-06 DIAGNOSIS — Z510 Encounter for antineoplastic radiation therapy: Secondary | ICD-10-CM | POA: Diagnosis not present

## 2022-03-06 DIAGNOSIS — Z170 Estrogen receptor positive status [ER+]: Secondary | ICD-10-CM | POA: Diagnosis not present

## 2022-03-06 LAB — RAD ONC ARIA SESSION SUMMARY
Course Elapsed Days: 15
Plan Fractions Treated to Date: 12
Plan Prescribed Dose Per Fraction: 2.67 Gy
Plan Total Fractions Prescribed: 15
Plan Total Prescribed Dose: 40.05 Gy
Reference Point Dosage Given to Date: 32.04 Gy
Reference Point Session Dosage Given: 2.67 Gy
Session Number: 12

## 2022-03-07 ENCOUNTER — Ambulatory Visit
Admission: RE | Admit: 2022-03-07 | Discharge: 2022-03-07 | Disposition: A | Discharge status: Home / Self Care | Payer: 59 | Source: Ambulatory Visit | Attending: Radiation Oncology | Admitting: Radiation Oncology

## 2022-03-07 ENCOUNTER — Other Ambulatory Visit: Payer: Self-pay

## 2022-03-07 DIAGNOSIS — Z510 Encounter for antineoplastic radiation therapy: Secondary | ICD-10-CM | POA: Diagnosis not present

## 2022-03-07 DIAGNOSIS — Z170 Estrogen receptor positive status [ER+]: Secondary | ICD-10-CM | POA: Diagnosis not present

## 2022-03-07 DIAGNOSIS — C50311 Malignant neoplasm of lower-inner quadrant of right female breast: Secondary | ICD-10-CM | POA: Diagnosis not present

## 2022-03-07 LAB — RAD ONC ARIA SESSION SUMMARY
Course Elapsed Days: 16
Plan Fractions Treated to Date: 13
Plan Prescribed Dose Per Fraction: 2.67 Gy
Plan Total Fractions Prescribed: 15
Plan Total Prescribed Dose: 40.05 Gy
Reference Point Dosage Given to Date: 34.71 Gy
Reference Point Session Dosage Given: 2.67 Gy
Session Number: 13

## 2022-03-10 ENCOUNTER — Ambulatory Visit: Payer: 59 | Admitting: Radiation Oncology

## 2022-03-10 ENCOUNTER — Ambulatory Visit
Admission: RE | Admit: 2022-03-10 | Discharge: 2022-03-10 | Disposition: A | Discharge status: Home / Self Care | Payer: 59 | Source: Ambulatory Visit | Attending: Radiation Oncology | Admitting: Radiation Oncology

## 2022-03-10 ENCOUNTER — Other Ambulatory Visit: Payer: Self-pay

## 2022-03-10 ENCOUNTER — Ambulatory Visit
Admission: RE | Admit: 2022-03-10 | Discharge: 2022-03-10 | Disposition: A | Discharge status: Home / Self Care | Payer: 59 | Source: Ambulatory Visit | Attending: Radiation Oncology

## 2022-03-10 DIAGNOSIS — C50311 Malignant neoplasm of lower-inner quadrant of right female breast: Secondary | ICD-10-CM | POA: Diagnosis not present

## 2022-03-10 DIAGNOSIS — Z510 Encounter for antineoplastic radiation therapy: Secondary | ICD-10-CM | POA: Diagnosis not present

## 2022-03-10 DIAGNOSIS — Z170 Estrogen receptor positive status [ER+]: Secondary | ICD-10-CM | POA: Diagnosis not present

## 2022-03-10 LAB — RAD ONC ARIA SESSION SUMMARY
Course Elapsed Days: 19
Plan Fractions Treated to Date: 14
Plan Prescribed Dose Per Fraction: 2.67 Gy
Plan Total Fractions Prescribed: 15
Plan Total Prescribed Dose: 40.05 Gy
Reference Point Dosage Given to Date: 37.38 Gy
Reference Point Session Dosage Given: 2.67 Gy
Session Number: 14

## 2022-03-11 ENCOUNTER — Ambulatory Visit
Admission: RE | Admit: 2022-03-11 | Discharge: 2022-03-11 | Disposition: A | Discharge status: Home / Self Care | Payer: 59 | Source: Ambulatory Visit | Attending: Radiation Oncology | Admitting: Radiation Oncology

## 2022-03-11 ENCOUNTER — Other Ambulatory Visit: Payer: Self-pay

## 2022-03-11 DIAGNOSIS — Z170 Estrogen receptor positive status [ER+]: Secondary | ICD-10-CM | POA: Diagnosis not present

## 2022-03-11 DIAGNOSIS — Z510 Encounter for antineoplastic radiation therapy: Secondary | ICD-10-CM | POA: Diagnosis not present

## 2022-03-11 DIAGNOSIS — C50311 Malignant neoplasm of lower-inner quadrant of right female breast: Secondary | ICD-10-CM | POA: Diagnosis not present

## 2022-03-11 LAB — RAD ONC ARIA SESSION SUMMARY
Course Elapsed Days: 20
Plan Fractions Treated to Date: 15
Plan Prescribed Dose Per Fraction: 2.67 Gy
Plan Total Fractions Prescribed: 15
Plan Total Prescribed Dose: 40.05 Gy
Reference Point Dosage Given to Date: 40.05 Gy
Reference Point Session Dosage Given: 2.67 Gy
Session Number: 15

## 2022-03-12 ENCOUNTER — Ambulatory Visit
Admission: RE | Admit: 2022-03-12 | Discharge: 2022-03-12 | Disposition: A | Discharge status: Home / Self Care | Payer: 59 | Source: Ambulatory Visit | Attending: Radiation Oncology

## 2022-03-12 ENCOUNTER — Other Ambulatory Visit: Payer: Self-pay

## 2022-03-12 DIAGNOSIS — Z510 Encounter for antineoplastic radiation therapy: Secondary | ICD-10-CM | POA: Diagnosis not present

## 2022-03-12 DIAGNOSIS — C50311 Malignant neoplasm of lower-inner quadrant of right female breast: Secondary | ICD-10-CM | POA: Diagnosis not present

## 2022-03-12 DIAGNOSIS — Z170 Estrogen receptor positive status [ER+]: Secondary | ICD-10-CM | POA: Diagnosis not present

## 2022-03-12 LAB — RAD ONC ARIA SESSION SUMMARY
Course Elapsed Days: 21
Plan Fractions Treated to Date: 1
Plan Prescribed Dose Per Fraction: 2 Gy
Plan Total Fractions Prescribed: 5
Plan Total Prescribed Dose: 10 Gy
Reference Point Dosage Given to Date: 2 Gy
Reference Point Session Dosage Given: 2 Gy
Session Number: 16

## 2022-03-13 ENCOUNTER — Encounter (HOSPITAL_BASED_OUTPATIENT_CLINIC_OR_DEPARTMENT_OTHER): Payer: Self-pay | Admitting: General Surgery

## 2022-03-13 ENCOUNTER — Ambulatory Visit
Admission: RE | Admit: 2022-03-13 | Discharge: 2022-03-13 | Disposition: A | Discharge status: Home / Self Care | Payer: 59 | Source: Ambulatory Visit | Attending: Radiation Oncology | Admitting: Radiation Oncology

## 2022-03-13 ENCOUNTER — Other Ambulatory Visit: Payer: Self-pay

## 2022-03-13 ENCOUNTER — Encounter (HOSPITAL_BASED_OUTPATIENT_CLINIC_OR_DEPARTMENT_OTHER)
Admission: RE | Disposition: A | Discharge status: Home / Self Care | Payer: Self-pay | Source: Home / Self Care | Attending: General Surgery

## 2022-03-13 ENCOUNTER — Ambulatory Visit (HOSPITAL_BASED_OUTPATIENT_CLINIC_OR_DEPARTMENT_OTHER): Payer: 59 | Admitting: Certified Registered"

## 2022-03-13 ENCOUNTER — Ambulatory Visit (HOSPITAL_BASED_OUTPATIENT_CLINIC_OR_DEPARTMENT_OTHER)
Admission: RE | Admit: 2022-03-13 | Discharge: 2022-03-13 | Disposition: A | Discharge status: Home / Self Care | Payer: 59 | Attending: General Surgery | Admitting: General Surgery

## 2022-03-13 DIAGNOSIS — Z803 Family history of malignant neoplasm of breast: Secondary | ICD-10-CM | POA: Insufficient documentation

## 2022-03-13 DIAGNOSIS — I493 Ventricular premature depolarization: Secondary | ICD-10-CM | POA: Insufficient documentation

## 2022-03-13 DIAGNOSIS — Z452 Encounter for adjustment and management of vascular access device: Secondary | ICD-10-CM | POA: Insufficient documentation

## 2022-03-13 DIAGNOSIS — C50311 Malignant neoplasm of lower-inner quadrant of right female breast: Secondary | ICD-10-CM | POA: Insufficient documentation

## 2022-03-13 DIAGNOSIS — Z853 Personal history of malignant neoplasm of breast: Secondary | ICD-10-CM | POA: Diagnosis not present

## 2022-03-13 DIAGNOSIS — Z9221 Personal history of antineoplastic chemotherapy: Secondary | ICD-10-CM | POA: Insufficient documentation

## 2022-03-13 DIAGNOSIS — C50919 Malignant neoplasm of unspecified site of unspecified female breast: Secondary | ICD-10-CM | POA: Diagnosis not present

## 2022-03-13 DIAGNOSIS — Z01818 Encounter for other preprocedural examination: Secondary | ICD-10-CM

## 2022-03-13 DIAGNOSIS — Z79899 Other long term (current) drug therapy: Secondary | ICD-10-CM | POA: Diagnosis not present

## 2022-03-13 DIAGNOSIS — Z170 Estrogen receptor positive status [ER+]: Secondary | ICD-10-CM | POA: Insufficient documentation

## 2022-03-13 HISTORY — PX: PORT-A-CATH REMOVAL: SHX5289

## 2022-03-13 LAB — RAD ONC ARIA SESSION SUMMARY
Course Elapsed Days: 22
Plan Fractions Treated to Date: 2
Plan Prescribed Dose Per Fraction: 2 Gy
Plan Total Fractions Prescribed: 5
Plan Total Prescribed Dose: 10 Gy
Reference Point Dosage Given to Date: 4 Gy
Reference Point Session Dosage Given: 2 Gy
Session Number: 17

## 2022-03-13 LAB — POCT PREGNANCY, URINE: Preg Test, Ur: NEGATIVE

## 2022-03-13 SURGERY — REMOVAL PORT-A-CATH
Anesthesia: Monitor Anesthesia Care | Site: Chest | Laterality: Left

## 2022-03-13 MED ORDER — OXYCODONE HCL 5 MG PO TABS: 5.0000 mg | ORAL_TABLET | Freq: Once | ORAL | Status: DC | PRN

## 2022-03-13 MED ORDER — FENTANYL CITRATE (PF) 100 MCG/2ML IJ SOLN: 25.0000 ug | INTRAMUSCULAR | Status: DC | PRN

## 2022-03-13 MED ORDER — MIDAZOLAM HCL 5 MG/5ML IJ SOLN
INTRAMUSCULAR | Status: DC | PRN
  Administered 2022-03-13: 2 mg via INTRAVENOUS

## 2022-03-13 MED ORDER — OXYCODONE HCL 5 MG/5ML PO SOLN: 5.0000 mg | Freq: Once | ORAL | Status: DC | PRN

## 2022-03-13 MED ORDER — FENTANYL CITRATE (PF) 100 MCG/2ML IJ SOLN
INTRAMUSCULAR | Status: DC | PRN
  Administered 2022-03-13 (×2): 50 ug via INTRAVENOUS

## 2022-03-13 MED ORDER — FENTANYL CITRATE (PF) 100 MCG/2ML IJ SOLN
INTRAMUSCULAR | Status: AC
  Filled 2022-03-13: qty 2

## 2022-03-13 MED ORDER — ACETAMINOPHEN 500 MG PO TABS
ORAL_TABLET | ORAL | Status: AC
  Filled 2022-03-13: qty 2

## 2022-03-13 MED ORDER — AMISULPRIDE (ANTIEMETIC) 5 MG/2ML IV SOLN: 10.0000 mg | Freq: Once | INTRAVENOUS | Status: DC | PRN

## 2022-03-13 MED ORDER — PROPOFOL 500 MG/50ML IV EMUL
INTRAVENOUS | Status: DC | PRN
  Administered 2022-03-13: 125 ug/kg/min via INTRAVENOUS

## 2022-03-13 MED ORDER — PROPOFOL 10 MG/ML IV BOLUS
INTRAVENOUS | Status: DC | PRN
  Administered 2022-03-13: 30 mg via INTRAVENOUS

## 2022-03-13 MED ORDER — ONDANSETRON HCL 4 MG/2ML IJ SOLN
INTRAMUSCULAR | Status: DC | PRN
  Administered 2022-03-13: 4 mg via INTRAVENOUS

## 2022-03-13 MED ORDER — LACTATED RINGERS IV SOLN: INTRAVENOUS | Status: DC

## 2022-03-13 MED ORDER — ACETAMINOPHEN 500 MG PO TABS
1000.0000 mg | ORAL_TABLET | ORAL | Status: AC
  Administered 2022-03-13: 1000 mg via ORAL

## 2022-03-13 MED ORDER — MIDAZOLAM HCL 2 MG/2ML IJ SOLN
INTRAMUSCULAR | Status: AC
  Filled 2022-03-13: qty 2

## 2022-03-13 MED ORDER — BUPIVACAINE HCL (PF) 0.25 % IJ SOLN
INTRAMUSCULAR | Status: DC | PRN
  Administered 2022-03-13: 6 mL

## 2022-03-13 SURGICAL SUPPLY — 30 items
ADH SKN CLS APL DERMABOND .7 (GAUZE/BANDAGES/DRESSINGS) ×1
APL PRP STRL LF DISP 70% ISPRP (MISCELLANEOUS) ×1
BLADE SURG 15 STRL LF DISP TIS (BLADE) ×1 IMPLANT
BLADE SURG 15 STRL SS (BLADE) ×1
CHLORAPREP W/TINT 26 (MISCELLANEOUS) ×1 IMPLANT
COVER BACK TABLE 60X90IN (DRAPES) ×1 IMPLANT
COVER MAYO STAND STRL (DRAPES) ×1 IMPLANT
DERMABOND ADVANCED .7 DNX12 (GAUZE/BANDAGES/DRESSINGS) ×1 IMPLANT
DRAPE LAPAROTOMY 100X72 PEDS (DRAPES) ×1 IMPLANT
DRAPE UTILITY XL STRL (DRAPES) ×1 IMPLANT
ELECT COATED BLADE 2.86 ST (ELECTRODE) ×1 IMPLANT
ELECT REM PT RETURN 9FT ADLT (ELECTROSURGICAL) ×1
ELECTRODE REM PT RTRN 9FT ADLT (ELECTROSURGICAL) ×1 IMPLANT
GLOVE BIO SURGEON STRL SZ7 (GLOVE) ×1 IMPLANT
GLOVE BIOGEL PI IND STRL 7.5 (GLOVE) ×1 IMPLANT
GOWN STRL REUS W/ TWL LRG LVL3 (GOWN DISPOSABLE) ×2 IMPLANT
GOWN STRL REUS W/TWL LRG LVL3 (GOWN DISPOSABLE) ×2
NDL HYPO 25X1 1.5 SAFETY (NEEDLE) ×1 IMPLANT
NEEDLE HYPO 25X1 1.5 SAFETY (NEEDLE) ×1 IMPLANT
PACK BASIN DAY SURGERY FS (CUSTOM PROCEDURE TRAY) ×1 IMPLANT
PENCIL SMOKE EVACUATOR (MISCELLANEOUS) ×1 IMPLANT
SLEEVE SCD COMPRESS KNEE MED (STOCKING) IMPLANT
SPIKE FLUID TRANSFER (MISCELLANEOUS) ×1 IMPLANT
SPONGE T-LAP 4X18 ~~LOC~~+RFID (SPONGE) ×1 IMPLANT
STRIP CLOSURE SKIN 1/2X4 (GAUZE/BANDAGES/DRESSINGS) ×1 IMPLANT
SUT MNCRL AB 4-0 PS2 18 (SUTURE) ×1 IMPLANT
SUT VIC AB 3-0 SH 27 (SUTURE) ×1
SUT VIC AB 3-0 SH 27X BRD (SUTURE) ×1 IMPLANT
SYR CONTROL 10ML LL (SYRINGE) ×1 IMPLANT
TOWEL GREEN STERILE FF (TOWEL DISPOSABLE) ×1 IMPLANT

## 2022-03-13 NOTE — Discharge Instructions (Addendum)
   POST OP INSTRUCTIONS  Always review your discharge instruction sheet given to you by the facility where your surgery was performed.   you may take acetaminophen (Tylenol) or ibuprofen (Advil) as needed.  You should follow a light diet for the remainder of the day after your procedure. Most patients will experience some mild swelling and/or bruising in the area of the incision. It may take several days to resolve. It is common to experience some constipation if taking pain medication after surgery. Increasing fluid intake and taking a stool softener (such as Colace) will usually help or prevent this problem from occurring. A mild laxative (Milk of Magnesia or Miralax) should be taken according to package directions if there are no bowel movements after 48 hours.  I used Dermabond (skin glue) on the incision, you may shower in 24 hours.  The glue will flake off over the next 2-3 weeks as will the steristrips.  ACTIVITIES: resume normal activity as tolerated Saturday   WHEN TO CALL YOUR DOCTOR [phone]): Fever over 101.0 Chills Continued bleeding from incision Increased redness and tenderness at the site Shortness of breath, difficulty breathing   The clinic staff is available to answer your questions during regular business hours. Please don't hesitate to call and ask to speak to one of the nurses or medical assistants for clinical concerns. If you have a medical emergency, go to the nearest emergency room or call 911.  A surgeon from [facility] is always on call at the hospital.     For further information, please visit [email or web address]   NO TYLENOL TILL 5PM

## 2022-03-13 NOTE — Anesthesia Postprocedure Evaluation (Signed)
Anesthesia Post Note  Patient: Adrienne Harris  Procedure(s) Performed: REMOVAL PORT-A-CATH (Left: Chest)     Patient location during evaluation: PACU Anesthesia Type: MAC Level of consciousness: awake and alert Pain management: pain level controlled Vital Signs Assessment: post-procedure vital signs reviewed and stable Respiratory status: spontaneous breathing, nonlabored ventilation, respiratory function stable and patient connected to nasal cannula oxygen Cardiovascular status: stable and blood pressure returned to baseline Postop Assessment: no apparent nausea or vomiting Anesthetic complications: no  No notable events documented.  Last Vitals:  Vitals:   03/13/22 1215 03/13/22 1243  BP:  137/87  Pulse:  69  Resp: 16 16  Temp:  (!) 36.3 C  SpO2:  96%    Last Pain:  Vitals:   03/13/22 1243  TempSrc:   PainSc: 1                  Tiajuana Amass

## 2022-03-13 NOTE — Op Note (Signed)
Preoperative diagnosis: Breast cancer completed chemotherapy no need for venous access Postoperative diagnosis: Same as above Procedure: Port removal Surgical Dr. Serita Grammes Anesthesia: Local with IV sedation Estimated blood loss: Minimal Complications: None Drains: None Specimens: None Sponge count was correct completion Disposition recovery stable condition  Indications: This a 47 year old female who with completed therapy for breast cancer.  She no longer desires venous access and request port removal.  Procedure: After informed consent was obtained she was taken to the operating room.  She desired to have sedation.  She was placed under monitored anesthesia care.  She was prepped and draped in standard sterile surgical fashion.  Surgical timeout was then performed.  Infiltrated Marcaine at the incision on the chest.  Then reentered the old incision.  I remove the port, line, suture material in their entirety.  Hemostasis was observed.  I closed this with 3-0 Vicryl, 4-0 Monocryl, and glue.  She tolerated this well and was transferred recovery stable.

## 2022-03-13 NOTE — H&P (Signed)
Adrienne Harris is an 47 y.o. female.   Chief Complaint: breast cancer HPI: 22 yof who has completed therapy for breast cancer. Now no longer needs venous access.    Past Medical History:  Diagnosis Date   Atopic dermatitis    Basal cell carcinoma    15 years ago.   Dysrhythmia    Hemochromatosis    Heterozygous for two genes   Palpitations    a. 04/2013 48h Holter: sinus rhythm/sinus arrhythmia, rare PAC's/PVC's.   PVC (premature ventricular contraction)    Shoulder pain, left 01/24/2015   Shoulder pain, right 01/24/2015    Past Surgical History:  Procedure Laterality Date   BREAST BIOPSY Right 09/25/2021   BREAST LUMPECTOMY WITH RADIOACTIVE SEED AND SENTINEL LYMPH NODE BIOPSY Right 10/28/2021   Procedure: RIGHT BREAST LUMPECTOMY WITH RADIOACTIVE SEED AND AXILLARY SENTINEL LYMPH NODE BIOPSY;  Surgeon: Rolm Bookbinder, MD;  Location: [facility];  Service: General;  Laterality: Right;   COLONOSCOPY WITH PROPOFOL N/A 01/24/2021   Procedure: COLONOSCOPY WITH PROPOFOL;  Surgeon: [name], MD;  Location: [facility];  Service: Endoscopy;  Laterality: N/A;   Excision of basal cell carcinoma     PORTACATH PLACEMENT Left 11/19/2021   Procedure: INSERTION PORT-A-CATH;  Surgeon: Rolm Bookbinder, MD;  Location: [place];  Service: General;  Laterality: Left;   WISDOM TOOTH EXTRACTION      Family History  Problem Relation Age of Onset   Hemochromatosis Mother    Basal cell carcinoma Mother    Hypertension Father    Melanoma Father        multiple removed   Colon polyps Father 58       adenomatous   Breast cancer Paternal Aunt 32   Kidney cancer Paternal Aunt    Alzheimer's disease Paternal Aunt    Hyperlipidemia Maternal Grandmother    Hypertension Maternal Grandmother    Osteoporosis Maternal Grandmother    Hyperlipidemia Maternal Grandfather    Hypertension Maternal Grandfather    Diabetes Maternal Grandfather    Stroke Maternal  Grandfather    Alzheimer's disease Paternal Grandmother    Breast cancer Other        late years   Breast cancer Other        late years   Colon cancer Neg Hx    Social History:  reports that she has never smoked. She has never used smokeless tobacco. She reports that she does not currently use alcohol after a past usage of about 2.0 standard drinks of alcohol per week. She reports that she does not use drugs.  Allergies:  Allergies  Allergen Reactions   Propylene Glycol     Skin rash   Hydrocortisone Rash    Allergic to all topical steriods Allergic to all topical corticosteriods   Other Rash    Topical steroid     No medications prior to admission.    No results found for this or any previous visit (from the past 48 hour(s)). No results found.  Review of Systems  All other systems reviewed and are negative.   Height '5\' 6"'$  (1.676 m), weight 61.7 kg, last menstrual period 09/30/2021. Physical Exam Constitutional:      Appearance: Normal appearance.  Cardiovascular:     Rate and Rhythm: Normal rate and regular rhythm.  Pulmonary:     Effort: Pulmonary effort is normal.     Comments: Port in place on chest  Neurological:     Mental Status: She is alert.  Assessment/Plan Breast cancer Port removal   Rolm Bookbinder, MD 03/13/2022, 10:00 AM

## 2022-03-13 NOTE — Transfer of Care (Signed)
Immediate Anesthesia Transfer of Care Note  Patient: Adrienne Harris  Procedure(s) Performed: REMOVAL PORT-A-CATH (Left: Chest)  Patient Location: PACU  Anesthesia Type:MAC  Level of Consciousness: awake, alert , and patient cooperative  Airway & Oxygen Therapy: Patient Spontanous Breathing and Patient connected to face mask oxygen  Post-op Assessment: Report given to RN and Post -op Vital signs reviewed and stable  Post vital signs: Reviewed and stable  Last Vitals:  Vitals Value Taken Time  BP    Temp    Pulse 85 03/13/22 1132  Resp    SpO2 99 % 03/13/22 1132  Vitals shown include unvalidated device data.  Last Pain:  Vitals:   03/13/22 1033  TempSrc: Oral  PainSc: 0-No pain      Patients Stated Pain Goal: 7 (26/94/85 [id number])  Complications: No notable events documented.

## 2022-03-13 NOTE — Interval H&P Note (Signed)
History and Physical Interval Note:  03/13/2022 10:46 AM  Adrienne Harris  has presented today for surgery, with the diagnosis of BREAST CANCER.  The various methods of treatment have been discussed with the patient and family. After consideration of risks, benefits and other options for treatment, the patient has consented to  Procedure(s): REMOVAL PORT-A-CATH (N/A) as a surgical intervention.  The patient's history has been reviewed, patient examined, no change in status, stable for surgery.  I have reviewed the patient's chart and labs.  Questions were answered to the patient's satisfaction.     Rolm Bookbinder

## 2022-03-13 NOTE — Anesthesia Preprocedure Evaluation (Signed)
Anesthesia Evaluation  Patient identified by MRN, date of birth, ID band Patient awake    Reviewed: Allergy & Precautions, NPO status , Patient's Chart, lab work & pertinent test results  Airway Mallampati: I  TM Distance: >3 FB Neck ROM: Full    Dental no notable dental hx. (+) Teeth Intact, Dental Advisory Given   Pulmonary neg pulmonary ROS   Pulmonary exam normal breath sounds clear to auscultation       Cardiovascular Normal cardiovascular exam+ dysrhythmias (PVCs on inderal)  Rhythm:Regular Rate:Normal     Neuro/Psych  Headaches  negative psych ROS   GI/Hepatic negative GI ROS, Neg liver ROS,,,  Endo/Other  negative endocrine ROS    Renal/GU negative Renal ROS  negative genitourinary   Musculoskeletal negative musculoskeletal ROS (+)    Abdominal   Peds negative pediatric ROS (+)  Hematology negative hematology ROS (+)   Anesthesia Other Findings   Reproductive/Obstetrics negative OB ROS                             Anesthesia Physical Anesthesia Plan  ASA: 2  Anesthesia Plan: MAC   Post-op Pain Management: Tylenol PO (pre-op)*   Induction: Intravenous  PONV Risk Score and Plan: 3 and Ondansetron, Dexamethasone, Treatment may vary due to age or medical condition and Propofol infusion  Airway Management Planned: Natural Airway and Simple Face Mask  Additional Equipment:   Intra-op Plan:   Post-operative Plan:   Informed Consent: I have reviewed the patients History and Physical, chart, labs and discussed the procedure including the risks, benefits and alternatives for the proposed anesthesia with the patient or authorized representative who has indicated his/her understanding and acceptance.     Dental advisory given  Plan Discussed with: CRNA and Anesthesiologist  Anesthesia Plan Comments:         Anesthesia Quick Evaluation

## 2022-03-14 ENCOUNTER — Other Ambulatory Visit: Payer: Self-pay

## 2022-03-14 ENCOUNTER — Inpatient Hospital Stay: Payer: 59

## 2022-03-14 ENCOUNTER — Other Ambulatory Visit: Payer: Self-pay | Admitting: *Deleted

## 2022-03-14 ENCOUNTER — Encounter (HOSPITAL_BASED_OUTPATIENT_CLINIC_OR_DEPARTMENT_OTHER): Payer: Self-pay | Admitting: General Surgery

## 2022-03-14 ENCOUNTER — Inpatient Hospital Stay (HOSPITAL_BASED_OUTPATIENT_CLINIC_OR_DEPARTMENT_OTHER): Payer: 59 | Admitting: Oncology

## 2022-03-14 ENCOUNTER — Inpatient Hospital Stay: Payer: 59 | Attending: Oncology

## 2022-03-14 ENCOUNTER — Ambulatory Visit
Admission: RE | Admit: 2022-03-14 | Discharge: 2022-03-14 | Disposition: A | Discharge status: Home / Self Care | Payer: 59 | Source: Ambulatory Visit | Attending: Radiation Oncology | Admitting: Radiation Oncology

## 2022-03-14 VITALS — BP 149/85 | HR 76 | Temp 98.3°F | Resp 14 | Wt 138.5 lb

## 2022-03-14 DIAGNOSIS — Z170 Estrogen receptor positive status [ER+]: Secondary | ICD-10-CM | POA: Diagnosis not present

## 2022-03-14 DIAGNOSIS — Z7189 Other specified counseling: Secondary | ICD-10-CM | POA: Diagnosis not present

## 2022-03-14 DIAGNOSIS — R5383 Other fatigue: Secondary | ICD-10-CM | POA: Insufficient documentation

## 2022-03-14 DIAGNOSIS — R232 Flushing: Secondary | ICD-10-CM | POA: Diagnosis not present

## 2022-03-14 DIAGNOSIS — M8580 Other specified disorders of bone density and structure, unspecified site: Secondary | ICD-10-CM | POA: Diagnosis not present

## 2022-03-14 DIAGNOSIS — C50311 Malignant neoplasm of lower-inner quadrant of right female breast: Secondary | ICD-10-CM

## 2022-03-14 DIAGNOSIS — Z79810 Long term (current) use of selective estrogen receptor modulators (SERMs): Secondary | ICD-10-CM | POA: Diagnosis not present

## 2022-03-14 DIAGNOSIS — Z79899 Other long term (current) drug therapy: Secondary | ICD-10-CM | POA: Diagnosis not present

## 2022-03-14 DIAGNOSIS — N951 Menopausal and female climacteric states: Secondary | ICD-10-CM | POA: Insufficient documentation

## 2022-03-14 LAB — CBC WITH DIFFERENTIAL/PLATELET
Abs Immature Granulocytes: 0.01 10*3/uL (ref 0.00–0.07)
Basophils Absolute: 0 10*3/uL (ref 0.0–0.1)
Basophils Relative: 1 %
Eosinophils Absolute: 0.3 10*3/uL (ref 0.0–0.5)
Eosinophils Relative: 6 %
HCT: 39.8 % (ref 36.0–46.0)
Hemoglobin: 13.3 g/dL (ref 12.0–15.0)
Immature Granulocytes: 0 %
Lymphocytes Relative: 23 %
Lymphs Abs: 1 10*3/uL (ref 0.7–4.0)
MCH: 33.7 pg (ref 26.0–34.0)
MCHC: 33.4 g/dL (ref 30.0–36.0)
MCV: 100.8 fL — ABNORMAL HIGH (ref 80.0–100.0)
Monocytes Absolute: 0.4 10*3/uL (ref 0.1–1.0)
Monocytes Relative: 11 %
Neutro Abs: 2.5 10*3/uL (ref 1.7–7.7)
Neutrophils Relative %: 59 %
Platelets: 188 10*3/uL (ref 150–400)
RBC: 3.95 MIL/uL (ref 3.87–5.11)
RDW: 12.7 % (ref 11.5–15.5)
WBC: 4.2 10*3/uL (ref 4.0–10.5)
nRBC: 0 % (ref 0.0–0.2)

## 2022-03-14 LAB — COMPREHENSIVE METABOLIC PANEL
ALT: 26 U/L (ref 0–44)
AST: 22 U/L (ref 15–41)
Albumin: 3.9 g/dL (ref 3.5–5.0)
Alkaline Phosphatase: 83 U/L (ref 38–126)
Anion gap: 8 (ref 5–15)
BUN: 15 mg/dL (ref 6–20)
CO2: 29 mmol/L (ref 22–32)
Calcium: 9.3 mg/dL (ref 8.9–10.3)
Chloride: 104 mmol/L (ref 98–111)
Creatinine, Ser: 0.59 mg/dL (ref 0.44–1.00)
GFR, Estimated: >60 mL/min (ref >60)
Glucose, Bld: 94 mg/dL (ref 70–99)
Potassium: 4.1 mmol/L (ref 3.5–5.1)
Sodium: 141 mmol/L (ref 135–145)
Total Bilirubin: 0.4 mg/dL (ref 0.3–1.2)
Total Protein: 6.6 g/dL (ref 6.5–8.1)

## 2022-03-14 LAB — RAD ONC ARIA SESSION SUMMARY
Course Elapsed Days: 23
Plan Fractions Treated to Date: 3
Plan Prescribed Dose Per Fraction: 2 Gy
Plan Total Fractions Prescribed: 5
Plan Total Prescribed Dose: 10 Gy
Reference Point Dosage Given to Date: 6 Gy
Reference Point Session Dosage Given: 2 Gy
Session Number: 18

## 2022-03-14 MED ORDER — GOSERELIN ACETATE 3.6 MG ~~LOC~~ IMPL
3.6000 mg | DRUG_IMPLANT | SUBCUTANEOUS | Status: AC
  Filled 2022-03-14: qty 3.6

## 2022-03-14 MED ORDER — TAMOXIFEN CITRATE 20 MG PO TABS
20.0000 mg | ORAL_TABLET | Freq: Every day | ORAL | 2 refills | Status: DC
  Filled 2022-03-14: qty 30, 30d supply, fill #0
  Filled 2022-04-13: qty 30, 30d supply, fill #1
  Filled 2022-05-07: qty 30, 30d supply, fill #2

## 2022-03-14 NOTE — Progress Notes (Unsigned)
Pt and husband in for 2 month follow up.  Pt reports she had port removed yesterday and has 2 more rad onc treatments left next week.

## 2022-03-14 NOTE — Progress Notes (Signed)
Left message stating courtesy call and if any questions or concerns please call the doctors office.  

## 2022-03-15 ENCOUNTER — Encounter: Payer: Self-pay | Admitting: Oncology

## 2022-03-15 LAB — FSH/LH
FSH: 4.1 m[IU]/mL
LH: 1.7 m[IU]/mL

## 2022-03-15 LAB — ESTRADIOL: Estradiol: 11.1 pg/mL

## 2022-03-15 NOTE — Progress Notes (Signed)
Hematology/Oncology Consult note [facility]  [phone] [phone]  Patient Care Team: [name], FNP as PCP - General (Family Medicine) Daiva Huge, RN as Oncology Nurse Navigator   Name of the patient: Adrienne Harris  [id number]  06/13/1975   Date of visit: 03/15/22  Diagnosis- pathological prognostic stage Ia invasive mammary carcinoma of the right breast ER/PR positive HER2 negative     Chief complaint/ Reason for visit-routine follow-up of breast cancer  Heme/Onc history: patient is a 47 year old female who recently underwent aLateral screening mammogram which showed a possible area of concern in the right breast.  This was followed by diagnostic mammogram and ultrasound which showed a 7 mm mass in the right lower breast.  No suspicious right axillary adenopathy.  Biopsy confirmed invasive mammary carcinoma 6 mm grade 3 ER 51 to 90% positive PR 51 to 90% positive and HER2 negative.     Oncotype score came back at 23 with an absolute chemotherapy benefit of about 6.5%.  Adjuvant TC chemotherapy was recommended.  4 cycles of chemotherapy were completed on 01/29/2022.  Patient is currently undergoing adjuvant radiation therapy.    Interval history-patient had 1 dose of Zoladex so far.  She does report fatigue and significant hot flashes.  She will be completing radiation in 2 days time.  ECOG PS- 0 Pain scale- 0  Review of systems- Review of Systems  Constitutional:  Positive for malaise/fatigue. Negative for chills, fever and weight loss.  HENT:  Negative for congestion, ear discharge and nosebleeds.   Eyes:  Negative for blurred vision.  Respiratory:  Negative for cough, hemoptysis, sputum production, shortness of breath and wheezing.   Cardiovascular:  Negative for chest pain, palpitations, orthopnea and claudication.  Gastrointestinal:  Negative for abdominal pain, blood in stool, constipation, diarrhea, heartburn, melena,  nausea and vomiting.  Genitourinary:  Negative for dysuria, flank pain, frequency, hematuria and urgency.  Musculoskeletal:  Negative for back pain, joint pain and myalgias.  Skin:  Negative for rash.  Neurological:  Negative for dizziness, tingling, focal weakness, seizures, weakness and headaches.  Endo/Heme/Allergies:  Does not bruise/bleed easily.       Hot flashes  Psychiatric/Behavioral:  Negative for depression and suicidal ideas. The patient does not have insomnia.       Allergies  Allergen Reactions   Propylene Glycol     Skin rash   Hydrocortisone Rash    Allergic to all topical steriods Allergic to all topical corticosteriods   Other Rash    Topical steroid      Past Medical History:  Diagnosis Date   Atopic dermatitis    Basal cell carcinoma    15 years ago.   Dysrhythmia    Hemochromatosis    Heterozygous for two genes   Palpitations    a. 04/2013 48h Holter: sinus rhythm/sinus arrhythmia, rare PAC's/PVC's.   PVC (premature ventricular contraction)    Shoulder pain, left 01/24/2015   Shoulder pain, right 01/24/2015     Past Surgical History:  Procedure Laterality Date   BREAST BIOPSY Right 09/25/2021   BREAST LUMPECTOMY WITH RADIOACTIVE SEED AND SENTINEL LYMPH NODE BIOPSY Right 10/28/2021   Procedure: RIGHT BREAST LUMPECTOMY WITH RADIOACTIVE SEED AND AXILLARY SENTINEL LYMPH NODE BIOPSY;  Surgeon: Rolm Bookbinder, MD;  Location: [place];  Service: General;  Laterality: Right;   COLONOSCOPY WITH PROPOFOL N/A 01/24/2021   Procedure: COLONOSCOPY WITH PROPOFOL;  Surgeon: [name], MD;  Location: [facility];  Service:  Endoscopy;  Laterality: N/A;   Excision of basal cell carcinoma     PORT-A-CATH REMOVAL Left 03/13/2022   Procedure: REMOVAL PORT-A-CATH;  Surgeon: Rolm Bookbinder, MD;  Location: [place];  Service: General;  Laterality: Left;   PORTACATH PLACEMENT Left 11/19/2021   Procedure: INSERTION PORT-A-CATH;   Surgeon: Rolm Bookbinder, MD;  Location: [place];  Service: General;  Laterality: Left;   WISDOM TOOTH EXTRACTION      Social History   Socioeconomic History   Marital status: Married    Spouse name: Not on file   Number of children: 1   Years of education: Not on file   Highest education level: Not on file  Occupational History   Occupation: Dietitian  Tobacco Use   Smoking status: Never   Smokeless tobacco: Never  Vaping Use   Vaping Use: Never used  Substance and Sexual Activity   Alcohol use: Not Currently    Alcohol/week: 2.0 standard drinks of alcohol    Types: 2 Cans of beer per week    Comment: 2-4 beer/wine aweek.   Drug use: No   Sexual activity: Yes    Partners: Male  Other Topics Concern   Not on file  Social History Narrative   Married      Daughter -10 yrs.       Dietitian for [employer]   Social Determinants of Health   Financial Resource Strain: Not on file  Food Insecurity: Not on file  Transportation Needs: Not on file  Physical Activity: Inactive (05/15/2017)   Exercise Vital Sign    Days of Exercise per Week: 0 days    Minutes of Exercise per Session: 0 min  Stress: No Stress Concern Present (05/15/2017)   [place]    Feeling of Stress : Only a little  Social Connections: Not on file  Intimate Partner Violence: Not on file    Family History  Problem Relation Age of Onset   Hemochromatosis Mother    Basal cell carcinoma Mother    Hypertension Father    Melanoma Father        multiple removed   Colon polyps Father 43       adenomatous   Breast cancer Paternal Aunt 15   Kidney cancer Paternal Aunt    Alzheimer's disease Paternal Aunt    Hyperlipidemia Maternal Grandmother    Hypertension Maternal Grandmother    Osteoporosis Maternal Grandmother    Hyperlipidemia Maternal Grandfather    Hypertension Maternal Grandfather    Diabetes Maternal Grandfather     Stroke Maternal Grandfather    Alzheimer's disease Paternal Grandmother    Breast cancer Other        late years   Breast cancer Other        late years   Colon cancer Neg Hx      Current Outpatient Medications:    Calcium Carb-Cholecalciferol (CALCIUM + VITAMIN D3 PO), Take by mouth. Calcium '600mg'$  and vit d 400 units, Disp: , Rfl:    cholecalciferol (VITAMIN D) 1000 units tablet, Take 1,000 Units by mouth daily., Disp: , Rfl:    Cyanocobalamin (VITAMIN B 12 PO), Take 1,000 mcg by mouth daily., Disp: , Rfl:    famotidine (PEPCID) 20 MG tablet, Take 20 mg by mouth daily., Disp: , Rfl:    propranolol (INDERAL) 20 MG tablet, Take 1 tablet (20 mg total) by mouth daily., Disp: 90 tablet, Rfl: 3   tamoxifen (NOLVADEX)  20 MG tablet, Take 1 tablet (20 mg total) by mouth daily., Disp: 30 tablet, Rfl: 2 No current facility-administered medications for this visit.  Facility-Administered Medications Ordered in Other Visits:    goserelin (ZOLADEX) injection 3.6 mg, 3.6 mg, Subcutaneous, Q28 days, [name], MD  Physical exam:  Vitals:   03/14/22 1019  BP: (!) 149/85  Pulse: 76  Resp: 14  Temp: 98.3 F (36.8 C)  TempSrc: Tympanic  SpO2: 100%  Weight: 138 lb 8 oz (62.8 kg)   Physical Exam Cardiovascular:     Rate and Rhythm: Normal rate and regular rhythm.     Heart sounds: Normal heart sounds.  Pulmonary:     Effort: Pulmonary effort is normal.     Breath sounds: Normal breath sounds.  Skin:    General: Skin is warm and dry.  Neurological:     Mental Status: She is alert and oriented to person, place, and time.         Latest Ref Rng & Units 03/14/2022    9:53 AM  CMP  Glucose 70 - 99 mg/dL 94   BUN 6 - 20 mg/dL 15   Creatinine 0.44 - 1.00 mg/dL 0.59   Sodium 135 - 145 mmol/L 141   Potassium 3.5 - 5.1 mmol/L 4.1   Chloride 98 - 111 mmol/L 104   CO2 22 - 32 mmol/L 29   Calcium 8.9 - 10.3 mg/dL 9.3   Total Protein 6.5 - 8.1 g/dL 6.6   Total Bilirubin 0.3 - 1.2 mg/dL  0.4   Alkaline Phos 38 - 126 U/L 83   AST 15 - 41 U/L 22   ALT 0 - 44 U/L 26       Latest Ref Rng & Units 03/14/2022    9:53 AM  CBC  WBC 4.0 - 10.5 K/uL 4.2   Hemoglobin 12.0 - 15.0 g/dL 13.3   Hematocrit 36.0 - 46.0 % 39.8   Platelets 150 - 400 K/uL 188     No images are attached to the encounter.  No results found.   Assessment and plan- Patient is a 47 y.o. female  with pathological prognostic stage Ia invasive mammary carcinoma of the right breast's pT1b N0 M0 ER 51 to 90% positive PR 51 to 90% positive and HER2 negative.  She is status post cycles of adjuvant TC chemotherapy.  She is currently undergoing adjuvant radiation therapy.  This is a routine follow-up visit  Patient received 1 dose of Zoladex and she does report significant fatigue and hot flashes.  The difference between OS plus AI versus tamoxifen in terms of her recurrence risk was about 1 to 2% as per RS clinical risk tool.  I therefore feel it would be okay to forego ovarian pression at this time and proceed with tamoxifen alone given her side effects.  Discussed risks and benefits of tamoxifen including all but not limited to hot flashes joint pains, risk of cataracts and uterine cancer as well as DVT.  Treatment will be given with a curative intent.  Patient understands and agrees to proceed as planned.  I am sending her prescription for tamoxifen today.  We will hold off on further Zoladex injections.  I would recommend adjuvant Zometa for her given that she was in the intermediate risk group requiring adjuvant chemotherapy.  Will plan to do Zometa every 6 months over 3 years with the first dose given in the next couple of weeks.  We have obtained dental clearance.  Patient  did have baseline osteopenia as noted on her bone density but her 10-year probability of a major osteoporotic fracture was less than 20% and hip fracture was less than 3%.  He will take at least a preventative dose of 1200 mg of calcium and 800  international units of vitamin D.  I will see her back in 3 months   Visit Diagnosis 1. Malignant neoplasm of lower-inner quadrant of right breast of female, estrogen receptor positive ([place])   2. Goals of care, counseling/discussion      Dr. Randa Evens, MD, MPH [facility] [phone] 03/15/2022 11:06 AM

## 2022-03-17 ENCOUNTER — Other Ambulatory Visit: Payer: Self-pay

## 2022-03-17 ENCOUNTER — Ambulatory Visit
Admission: RE | Admit: 2022-03-17 | Discharge: 2022-03-17 | Disposition: A | Discharge status: Home / Self Care | Payer: 59 | Source: Ambulatory Visit | Attending: Radiation Oncology | Admitting: Radiation Oncology

## 2022-03-17 DIAGNOSIS — R5383 Other fatigue: Secondary | ICD-10-CM | POA: Diagnosis not present

## 2022-03-17 DIAGNOSIS — Z79810 Long term (current) use of selective estrogen receptor modulators (SERMs): Secondary | ICD-10-CM | POA: Diagnosis not present

## 2022-03-17 DIAGNOSIS — C50311 Malignant neoplasm of lower-inner quadrant of right female breast: Secondary | ICD-10-CM | POA: Diagnosis not present

## 2022-03-17 DIAGNOSIS — M8580 Other specified disorders of bone density and structure, unspecified site: Secondary | ICD-10-CM | POA: Diagnosis not present

## 2022-03-17 DIAGNOSIS — Z79899 Other long term (current) drug therapy: Secondary | ICD-10-CM | POA: Diagnosis not present

## 2022-03-17 DIAGNOSIS — N951 Menopausal and female climacteric states: Secondary | ICD-10-CM | POA: Diagnosis not present

## 2022-03-17 DIAGNOSIS — Z170 Estrogen receptor positive status [ER+]: Secondary | ICD-10-CM | POA: Diagnosis not present

## 2022-03-17 DIAGNOSIS — R232 Flushing: Secondary | ICD-10-CM | POA: Diagnosis not present

## 2022-03-17 LAB — RAD ONC ARIA SESSION SUMMARY
Course Elapsed Days: 26
Plan Fractions Treated to Date: 4
Plan Prescribed Dose Per Fraction: 2 Gy
Plan Total Fractions Prescribed: 5
Plan Total Prescribed Dose: 10 Gy
Reference Point Dosage Given to Date: 8 Gy
Reference Point Session Dosage Given: 2 Gy
Session Number: 19

## 2022-03-18 ENCOUNTER — Other Ambulatory Visit: Payer: Self-pay

## 2022-03-18 ENCOUNTER — Ambulatory Visit
Admission: RE | Admit: 2022-03-18 | Discharge: 2022-03-18 | Disposition: A | Discharge status: Home / Self Care | Payer: 59 | Source: Ambulatory Visit | Attending: Radiation Oncology | Admitting: Radiation Oncology

## 2022-03-18 DIAGNOSIS — C50311 Malignant neoplasm of lower-inner quadrant of right female breast: Secondary | ICD-10-CM | POA: Diagnosis not present

## 2022-03-18 DIAGNOSIS — Z170 Estrogen receptor positive status [ER+]: Secondary | ICD-10-CM | POA: Diagnosis not present

## 2022-03-18 DIAGNOSIS — R232 Flushing: Secondary | ICD-10-CM | POA: Diagnosis not present

## 2022-03-18 DIAGNOSIS — R5383 Other fatigue: Secondary | ICD-10-CM | POA: Diagnosis not present

## 2022-03-18 DIAGNOSIS — N951 Menopausal and female climacteric states: Secondary | ICD-10-CM | POA: Diagnosis not present

## 2022-03-18 DIAGNOSIS — M8580 Other specified disorders of bone density and structure, unspecified site: Secondary | ICD-10-CM | POA: Diagnosis not present

## 2022-03-18 DIAGNOSIS — Z79810 Long term (current) use of selective estrogen receptor modulators (SERMs): Secondary | ICD-10-CM | POA: Diagnosis not present

## 2022-03-18 DIAGNOSIS — Z510 Encounter for antineoplastic radiation therapy: Secondary | ICD-10-CM | POA: Diagnosis not present

## 2022-03-18 DIAGNOSIS — Z79899 Other long term (current) drug therapy: Secondary | ICD-10-CM | POA: Diagnosis not present

## 2022-03-18 LAB — RAD ONC ARIA SESSION SUMMARY
Course Elapsed Days: 27
Plan Fractions Treated to Date: 5
Plan Prescribed Dose Per Fraction: 2 Gy
Plan Total Fractions Prescribed: 5
Plan Total Prescribed Dose: 10 Gy
Reference Point Dosage Given to Date: 10 Gy
Reference Point Session Dosage Given: 2 Gy
Session Number: 20

## 2022-03-19 ENCOUNTER — Encounter: Payer: Self-pay | Admitting: *Deleted

## 2022-03-20 ENCOUNTER — Inpatient Hospital Stay: Payer: 59

## 2022-03-20 VITALS — BP 119/84 | HR 70 | Temp 97.8°F | Resp 16

## 2022-03-20 DIAGNOSIS — M8580 Other specified disorders of bone density and structure, unspecified site: Secondary | ICD-10-CM | POA: Diagnosis not present

## 2022-03-20 DIAGNOSIS — Z170 Estrogen receptor positive status [ER+]: Secondary | ICD-10-CM | POA: Diagnosis not present

## 2022-03-20 DIAGNOSIS — R232 Flushing: Secondary | ICD-10-CM | POA: Diagnosis not present

## 2022-03-20 DIAGNOSIS — C50311 Malignant neoplasm of lower-inner quadrant of right female breast: Secondary | ICD-10-CM | POA: Diagnosis not present

## 2022-03-20 DIAGNOSIS — Z79810 Long term (current) use of selective estrogen receptor modulators (SERMs): Secondary | ICD-10-CM | POA: Diagnosis not present

## 2022-03-20 DIAGNOSIS — N951 Menopausal and female climacteric states: Secondary | ICD-10-CM | POA: Diagnosis not present

## 2022-03-20 DIAGNOSIS — R5383 Other fatigue: Secondary | ICD-10-CM | POA: Diagnosis not present

## 2022-03-20 DIAGNOSIS — Z79899 Other long term (current) drug therapy: Secondary | ICD-10-CM | POA: Diagnosis not present

## 2022-03-20 MED ORDER — SODIUM CHLORIDE 0.9 % IV SOLN
Freq: Once | INTRAVENOUS | Status: AC
  Filled 2022-03-20: qty 250

## 2022-03-20 MED ORDER — ZOLEDRONIC ACID 4 MG/100ML IV SOLN
4.0000 mg | INTRAVENOUS | Status: DC
  Administered 2022-03-20: 4 mg via INTRAVENOUS
  Filled 2022-03-20: qty 100

## 2022-03-20 NOTE — Patient Instructions (Signed)

## 2022-03-20 NOTE — Radiation Completion Notes (Signed)
Patient Name: Adrienne Harris, Adrienne Harris MRN: [id number] Date of Birth: May 03, 1975 Referring Physician: Mable Paris, M.D. Date of Service: 2022-03-20 Radiation Oncologist: Eppie Gibson, M.D. [facility] END OF TREATMENT NOTE     Diagnosis: C50.311 Malignant neoplasm of lower-inner quadrant of right female breast Staging on 2021-11-14: Malignant neoplasm of lower-inner quadrant of right breast of female, estrogen receptor positive ([place]) T=pT1b, N=pN0, M=cM0 Staging on 2021-10-01: Malignant neoplasm of lower-inner quadrant of right breast of female, estrogen receptor positive ([place]) T=cT1b, N=cN0, M=cM0 Intent: Curative     ==========DELIVERED PLANS==========  First Treatment Date: 2022-02-19 - Last Treatment Date: 2022-03-18   Plan Name: Breast_R Site: Breast, Right Technique: 3D Mode: Photon Dose Per Fraction: 2.67 Gy Prescribed Dose (Delivered / Prescribed): 40.05 Gy / 40.05 Gy Prescribed Fxs (Delivered / Prescribed): 15 / 15   Plan Name: Breast_R_Bst Site: Breast, Right Technique: 3D Mode: Photon Dose Per Fraction: 2 Gy Prescribed Dose (Delivered / Prescribed): 10 Gy / 10 Gy Prescribed Fxs (Delivered / Prescribed): 5 / 5     ==========ON TREATMENT VISIT DATES========== 2022-02-24, 2022-03-03, 2022-03-10, 2022-03-17     ==========UPCOMING VISITS==========       ==========APPENDIX - ON TREATMENT VISIT NOTES==========   See weekly On Treatment Notes is Epic for details.

## 2022-04-02 NOTE — Therapy (Signed)
OUTPATIENT PHYSICAL THERAPY ONCOLOGY EVALUATION  Patient Name: Adrienne Harris MRN: [id number] DOB:[birth year], 47 y.o., female Today's Date: 04/03/2022  END OF SESSION:  PT End of Session - 04/03/22 0954     Visit Number 1    Number of Visits 9    Date for PT Re-Evaluation 05/01/22    PT Start Time 0906    PT Stop Time 0952    PT Time Calculation (min) 46 min    Activity Tolerance Patient tolerated treatment well    Behavior During Therapy [facility] for tasks assessed/performed             Past Medical History:  Diagnosis Date   Atopic dermatitis    Basal cell carcinoma    15 years ago.   Dysrhythmia    Hemochromatosis    Heterozygous for two genes   Palpitations    a. 04/2013 48h Holter: sinus rhythm/sinus arrhythmia, rare PAC's/PVC's.   PVC (premature ventricular contraction)    Shoulder pain, left 01/24/2015   Shoulder pain, right 01/24/2015   Past Surgical History:  Procedure Laterality Date   BREAST BIOPSY Right 09/25/2021   BREAST LUMPECTOMY WITH RADIOACTIVE SEED AND SENTINEL LYMPH NODE BIOPSY Right 10/28/2021   Procedure: RIGHT BREAST LUMPECTOMY WITH RADIOACTIVE SEED AND AXILLARY SENTINEL LYMPH NODE BIOPSY;  Surgeon: Rolm Bookbinder, MD;  Location: [place];  Service: General;  Laterality: Right;   COLONOSCOPY WITH PROPOFOL N/A 01/24/2021   Procedure: COLONOSCOPY WITH PROPOFOL;  Surgeon: [name], MD;  Location: [facility];  Service: Endoscopy;  Laterality: N/A;   Excision of basal cell carcinoma     PORT-A-CATH REMOVAL Left 03/13/2022   Procedure: REMOVAL PORT-A-CATH;  Surgeon: Rolm Bookbinder, MD;  Location: [place];  Service: General;  Laterality: Left;   PORTACATH PLACEMENT Left 11/19/2021   Procedure: INSERTION PORT-A-CATH;  Surgeon: Rolm Bookbinder, MD;  Location: [place];  Service: General;  Laterality: Left;   WISDOM TOOTH EXTRACTION     Patient Active Problem List   Diagnosis Date Noted    Genetic testing 10/24/2021   Malignant neoplasm of lower-inner quadrant of right breast of female, estrogen receptor positive ([place]) 10/01/2021   Colon cancer screening    Intractable menstrual migraine without status migrainosus 07/30/2020   Nonallopathic lesion of sacral region 12/30/2017   Trigger point of left shoulder region 10/06/2017   Slipped rib syndrome 08/05/2017   Nonallopathic lesion of rib cage 08/05/2017   Nonallopathic lesion of cervical region 08/05/2017   Nonallopathic lesion of thoracic region 08/05/2017   Nonallopathic lesion of lumbosacral region 08/05/2017   Chronic left shoulder pain 07/10/2017   Chronic LLQ pain 05/17/2017   Atopic dermatitis 07/10/2016   Hepatic cyst 07/10/2016   Routine physical examination 07/10/2016   PVC's (premature ventricular contractions) 04/22/2013   Hemochromatosis 04/22/2013    PCP: Mable Paris, FNP   REFERRING PROVIDER: Rolm Bookbinder, MD  REFERRING DIAG: C50.311 (ICD-10-CM) - Malignant neoplasm of lower-inner quadrant of right female breast Z17.0 (ICD-10-CM) - Estrogen receptor positive status (ER+)    THERAPY DIAG:  Stiffness of right shoulder, not elsewhere classified  Disorder of the skin and subcutaneous tissue related to radiation, unspecified  Lymphedema, not elsewhere classified  Aftercare following surgery for neoplasm  Abnormal posture  Malignant neoplasm of lower-inner quadrant of right breast of female, estrogen receptor positive ([place])  ONSET DATE: 01/28/23  Rationale for Evaluation and Treatment: Rehabilitation  SUBJECTIVE:  SUBJECTIVE STATEMENT: I completed radiation on Mar 18, 2022. The cording is worse than it was when I was here before.  PERTINENT HISTORY:  Patient was diagnosed on 09/25/2021 with right grade 3  invasive mammary carcinoma of no particular type. She underwent a right lumpectomy and sentinel node biopsy on 09/28/2021. It is ER/PR positive and HER2 negative and the Ki67 was not measured.   PAIN:  Are you having pain? Yes NPRS scale: 2/10 Pain location: R axilla Pain orientation: Right  PAIN TYPE: aching and dull Pain description: intermittent  Aggravating factors: reaching up, using your arm Relieving factors: not lifting it   PRECAUTIONS: Other: at risk of lymphedema  WEIGHT BEARING RESTRICTIONS: No  FALLS:  Has patient fallen in last 6 months? 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[employer]: 5- 6 days/ week 30-45 min on treadmill, walking  HAND DOMINANCE: right   PRIOR LEVEL OF FUNCTION: Independent  PATIENT GOALS: to increase ROM, decrease pain   OBJECTIVE:  COGNITION: Overall cognitive status: Within functional limits for tasks assessed   PALPATION: Cording palpable from axilla extending down towards forearm  OBSERVATIONS / OTHER ASSESSMENTS: numerous cords visible in upper arm when pt abducts UE  POSTURE: forward head and rounded shoulders  UPPER EXTREMITY AROM/PROM:  A/PROM RIGHT   eval   Shoulder extension 56  Shoulder flexion 136  Shoulder abduction 107  Shoulder internal rotation 54  Shoulder external rotation 83    (Blank rows = not tested)  A/PROM LEFT   eval  Shoulder extension 55  Shoulder flexion 166  Shoulder abduction 165  Shoulder internal rotation 61  Shoulder external rotation 91    (Blank rows = not tested)   LYMPHEDEMA ASSESSMENTS:   SURGERY TYPE/DATE: R lumpectomy and SLNB 10/28/21  NUMBER OF LYMPH NODES REMOVED: 5  CHEMOTHERAPY: completed 01/29/22  RADIATION: completed 03/18/22  HORMONE TREATMENT: currently on  tamoxifen  INFECTIONS: none  LYMPHEDEMA ASSESSMENTS:   LANDMARK RIGHT  eval  10 cm proximal to olecranon process 26  Olecranon process 24.4  10 cm proximal to ulnar styloid process 19.2  Just proximal to ulnar styloid process 15.6  Across hand at thumb web space 18.5  At base of 2nd digit 5.9  (Blank rows = not tested)  LANDMARK LEFT  eval  10 cm proximal to olecranon process 25.8  Olecranon process 24  10 cm proximal to ulnar styloid process 19  Just proximal to ulnar styloid process 15.3  Across hand at thumb web space 18.4  At base of 2nd digit 5.5  (Blank rows = not tested)   L-DEX LYMPHEDEMA SCREENING:  The patient was assessed using the L-Dex machine today to produce a lymphedema index baseline score. The patient will be reassessed on a regular basis (typically every 3 months) to obtain new L-Dex scores. If the score is > 6.5 points away from his/her baseline score indicating onset of subclinical lymphedema, it will be recommended to wear a compression garment for 4 weeks, 12 hours per day and then be reassessed. If the score continues to be > 6.5 points from baseline at reassessment, we will initiate lymphedema treatment. Assessing in this manner has a 95% rate of preventing clinically significant lymphedema.   L-DEX FLOWSHEETS - 04/03/22 0900       L-DEX LYMPHEDEMA SCREENING  Measurement Type Unilateral    L-DEX MEASUREMENT EXTREMITY Upper Extremity    POSITION  Standing    DOMINANT SIDE Right    At Risk Side Right    BASELINE SCORE (UNILATERAL) -6.4    L-DEX SCORE (UNILATERAL) 6.7    VALUE CHANGE (UNILAT) 13.1             L-DEX FLOWSHEETS - 04/03/22 0900       L-DEX LYMPHEDEMA SCREENING   Measurement Type Unilateral    L-DEX MEASUREMENT EXTREMITY Upper Extremity    POSITION  Standing    DOMINANT SIDE Right    At Risk Side Right    BASELINE SCORE (UNILATERAL) -6.4    L-DEX SCORE (UNILATERAL) 6.7    VALUE CHANGE (UNILAT) 13.1              L-DEX FLOWSHEETS - 04/03/22 0900       L-DEX LYMPHEDEMA SCREENING   Measurement Type Unilateral    L-DEX MEASUREMENT EXTREMITY Upper Extremity    POSITION  Standing    DOMINANT SIDE Right    At Risk Side Right    BASELINE SCORE (UNILATERAL) -6.4    L-DEX SCORE (UNILATERAL) 6.7    VALUE CHANGE (UNILAT) 13.1            The patient was assessed using the L-Dex machine today to produce a lymphedema index baseline score. The patient will be reassessed on a regular basis (typically every 3 months) to obtain new L-Dex scores. If the score is > 6.5 points away from his/her baseline score indicating onset of subclinical lymphedema, it will be recommended to wear a compression garment for 4 weeks, 12 hours per day and then be reassessed. If the score continues to be > 6.5 points from baseline at reassessment, we will initiate lymphedema treatment. Assessing in this manner has a 95% rate of preventing clinically significant lymphedema.  QUICK DASH SURVEY:   Katina Dung - 04/03/22 0001     Open a tight or new jar No difficulty    Do heavy household chores (wash walls, wash floors) No difficulty    Carry a shopping bag or briefcase No difficulty    Wash your back No difficulty    Use a knife to cut food No difficulty    Recreational activities in which you take some force or impact through your arm, shoulder, or hand (golf, hammering, tennis) No difficulty    During the past week, to what extent has your arm, shoulder or hand problem interfered with your normal social activities with family, friends, neighbors, or groups? Not at all    During the past week, to what extent has your arm, shoulder or hand problem limited your work or other regular daily activities Slightly    Arm, shoulder, or hand pain. Mild    Tingling (pins and needles) in your arm, shoulder, or hand None    Difficulty Sleeping No difficulty    DASH Score 4.55 %               TODAY'S TREATMENT:  DATE:  04/03/22: Gentle MFR to cording throughout R UE followed by MLD: In supine: Short neck, 5 diaphragmatic breaths, L axillary nodes and establishment of interaxillary pathway, R inguinal nodes and establishment of axilloinguinal pathway, then R UE working proximal to distal, moving inner upper arm outwards and upwards, and doing both sides of forearms retracing all steps. Instructed pt throughout in basic principles of MLD and need for correct skin stretch.    PATIENT EDUCATION:  Education details: post op exercises, anatomy and physiology of the lymphatic system, basic principles of MLD, subclinical lymphedema, wear sleeve 12hrs/day for 4 weeks and will recheck SOZO Person educated: Patient Education method: Explanation Education comprehension: verbalized understanding  HOME EXERCISE PROGRAM: Post op breast exercises Stretching in to cording  ASSESSMENT:  CLINICAL IMPRESSION: Patient is a 47 y.o. female who was seen today for physical therapy evaluation and treatment for decreased R shoulder ROM, cording and subclinical lymphedema. Pt reports she has been seen previously for cording in her R UE but it has worsened with radiation which she completed earlier this month. She has limited R shoulder ROM due to the cording and it causes discomfort. Her SOZO score was elevated today and was in the red demonstrating increased lymphatic fluid. Educated pt to return to wearing her compression sleeve for 12 hours a day for 4 weeks. Will instruct pt in self MLD. Pt would benefit from skilled PT services to improve R shoulder ROM, decrease R UE edema and decrease cording to allow improved comfort.   OBJECTIVE IMPAIRMENTS: decreased knowledge of use of DME, decreased ROM, increased edema, increased fascial restrictions, impaired UE functional use, postural dysfunction, and pain.   ACTIVITY  LIMITATIONS: carrying, lifting, and reach over head  PARTICIPATION LIMITATIONS:  none  PERSONAL FACTORS:  none  are also affecting patient's functional outcome.   REHAB POTENTIAL: Good  CLINICAL DECISION MAKING: Stable/uncomplicated  EVALUATION COMPLEXITY: Low  GOALS: Goals reviewed with patient? Yes  SHORT TERM GOALS=LONG TERM GOALS Target date: 05/01/22  Pt will be independent in self MLD for long term management of edema.  Baseline: Goal status: INITIAL  2.  Pt will demonstrate 165 degrees of R shoulder flexion to allow her to reach overhead. Baseline:  Goal status: INITIAL  3.  Pt will demonstrate 165 degrees of R shoulder abduction to allow her to reach out to the side.  Baseline:  Goal status: INITIAL  4.  Pt will return to the green on SOZO to decrease risk of long term lymphedema.  Baseline:  Goal status: INITIAL  5.  Pt will be independent in a home exercise program for continued stretching and strengthening.  Baseline:  Goal status: INITIAL   PLAN:  PT FREQUENCY: 2x/week  PT DURATION: 4 weeks  PLANNED INTERVENTIONS: Therapeutic exercises, Therapeutic activity, Patient/Family education, Self Care, Joint mobilization, Orthotic/Fit training, Manual lymph drainage, Compression bandaging, scar mobilization, Vasopneumatic device, Manual therapy, and Re-evaluation  PLAN FOR NEXT SESSION: instruct in MLD for RUE, repeat SOZO in one month from 04/03/22, PROM to R shoulder, pulleys, ball, MFR to cording   [employer], PT 04/03/2022, 1:36 PM

## 2022-04-03 ENCOUNTER — Ambulatory Visit: Payer: 59 | Attending: General Surgery | Admitting: Physical Therapy

## 2022-04-03 ENCOUNTER — Other Ambulatory Visit: Payer: Self-pay

## 2022-04-03 ENCOUNTER — Encounter: Payer: Self-pay | Admitting: Physical Therapy

## 2022-04-03 DIAGNOSIS — Z170 Estrogen receptor positive status [ER+]: Secondary | ICD-10-CM | POA: Diagnosis not present

## 2022-04-03 DIAGNOSIS — I890 Lymphedema, not elsewhere classified: Secondary | ICD-10-CM

## 2022-04-03 DIAGNOSIS — Z483 Aftercare following surgery for neoplasm: Secondary | ICD-10-CM

## 2022-04-03 DIAGNOSIS — L599 Disorder of the skin and subcutaneous tissue related to radiation, unspecified: Secondary | ICD-10-CM

## 2022-04-03 DIAGNOSIS — R293 Abnormal posture: Secondary | ICD-10-CM | POA: Diagnosis not present

## 2022-04-03 DIAGNOSIS — C50311 Malignant neoplasm of lower-inner quadrant of right female breast: Secondary | ICD-10-CM | POA: Insufficient documentation

## 2022-04-03 DIAGNOSIS — M25611 Stiffness of right shoulder, not elsewhere classified: Secondary | ICD-10-CM | POA: Diagnosis not present

## 2022-04-07 ENCOUNTER — Ambulatory Visit: Payer: 59 | Admitting: Physical Therapy

## 2022-04-07 ENCOUNTER — Encounter: Payer: Self-pay | Admitting: Physical Therapy

## 2022-04-07 DIAGNOSIS — I890 Lymphedema, not elsewhere classified: Secondary | ICD-10-CM | POA: Diagnosis not present

## 2022-04-07 DIAGNOSIS — R293 Abnormal posture: Secondary | ICD-10-CM | POA: Diagnosis not present

## 2022-04-07 DIAGNOSIS — Z170 Estrogen receptor positive status [ER+]: Secondary | ICD-10-CM | POA: Diagnosis not present

## 2022-04-07 DIAGNOSIS — C50311 Malignant neoplasm of lower-inner quadrant of right female breast: Secondary | ICD-10-CM

## 2022-04-07 DIAGNOSIS — M25611 Stiffness of right shoulder, not elsewhere classified: Secondary | ICD-10-CM

## 2022-04-07 DIAGNOSIS — L599 Disorder of the skin and subcutaneous tissue related to radiation, unspecified: Secondary | ICD-10-CM | POA: Diagnosis not present

## 2022-04-07 DIAGNOSIS — Z483 Aftercare following surgery for neoplasm: Secondary | ICD-10-CM | POA: Diagnosis not present

## 2022-04-07 NOTE — Therapy (Signed)
OUTPATIENT PHYSICAL THERAPY ONCOLOGY TREATMENT  Patient Name: Adrienne Harris MRN: [id number] DOB:1975/05/29, 47 y.o., female Today's Date: 04/07/2022  END OF SESSION:  PT End of Session - 04/07/22 1706     Visit Number 2    Number of Visits 9    Date for PT Re-Evaluation 05/01/22    PT Start Time 1603    PT Stop Time [id number]    PT Time Calculation (min) 55 min    Activity Tolerance Patient tolerated treatment well    Behavior During Therapy WFL for tasks assessed/performed             Past Medical History:  Diagnosis Date   Atopic dermatitis    Basal cell carcinoma    15 years ago.   Dysrhythmia    Hemochromatosis    Heterozygous for two genes   Palpitations    a. 04/2013 48h Holter: sinus rhythm/sinus arrhythmia, rare PAC's/PVC's.   PVC (premature ventricular contraction)    Shoulder pain, left 01/24/2015   Shoulder pain, right 01/24/2015   Past Surgical History:  Procedure Laterality Date   BREAST BIOPSY Right 09/25/2021   BREAST LUMPECTOMY WITH RADIOACTIVE SEED AND SENTINEL LYMPH NODE BIOPSY Right 10/28/2021   Procedure: RIGHT BREAST LUMPECTOMY WITH RADIOACTIVE SEED AND AXILLARY SENTINEL LYMPH NODE BIOPSY;  Surgeon: Rolm Bookbinder, MD;  Location: [place];  Service: General;  Laterality: Right;   COLONOSCOPY WITH PROPOFOL N/A 01/24/2021   Procedure: COLONOSCOPY WITH PROPOFOL;  Surgeon: [name], MD;  Location: [facility];  Service: Endoscopy;  Laterality: N/A;   Excision of basal cell carcinoma     PORT-A-CATH REMOVAL Left 03/13/2022   Procedure: REMOVAL PORT-A-CATH;  Surgeon: Rolm Bookbinder, MD;  Location: [place];  Service: General;  Laterality: Left;   PORTACATH PLACEMENT Left 11/19/2021   Procedure: INSERTION PORT-A-CATH;  Surgeon: Rolm Bookbinder, MD;  Location: [place];  Service: General;  Laterality: Left;   WISDOM TOOTH EXTRACTION     Patient Active Problem List   Diagnosis Date Noted    Genetic testing 10/24/2021   Malignant neoplasm of lower-inner quadrant of right breast of female, estrogen receptor positive ([place]) 10/01/2021   Colon cancer screening    Intractable menstrual migraine without status migrainosus 07/30/2020   Nonallopathic lesion of sacral region 12/30/2017   Trigger point of left shoulder region 10/06/2017   Slipped rib syndrome 08/05/2017   Nonallopathic lesion of rib cage 08/05/2017   Nonallopathic lesion of cervical region 08/05/2017   Nonallopathic lesion of thoracic region 08/05/2017   Nonallopathic lesion of lumbosacral region 08/05/2017   Chronic left shoulder pain 07/10/2017   Chronic LLQ pain 05/17/2017   Atopic dermatitis 07/10/2016   Hepatic cyst 07/10/2016   Routine physical examination 07/10/2016   PVC's (premature ventricular contractions) 04/22/2013   Hemochromatosis 04/22/2013    PCP: Mable Paris, FNP   REFERRING PROVIDER: Rolm Bookbinder, MD  REFERRING DIAG: C50.311 (ICD-10-CM) - Malignant neoplasm of lower-inner quadrant of right female breast Z17.0 (ICD-10-CM) - Estrogen receptor positive status (ER+)    THERAPY DIAG:  Stiffness of right shoulder, not elsewhere classified  Disorder of the skin and subcutaneous tissue related to radiation, unspecified  Lymphedema, not elsewhere classified  Aftercare following surgery for neoplasm  Abnormal posture  Malignant neoplasm of lower-inner quadrant of right breast of female, estrogen receptor positive (Adrienne Harris)  ONSET DATE: 01/28/23  Rationale for Evaluation and Treatment: Rehabilitation  SUBJECTIVE:  SUBJECTIVE STATEMENT: The cording feels about the same. I have been wearing my sleeve.  PERTINENT HISTORY:  Patient was diagnosed on 09/25/2021 with right grade 3 invasive mammary carcinoma of  no particular type. She underwent a right lumpectomy and sentinel node biopsy on 09/28/2021. It is ER/PR positive and HER2 negative and the Ki67 was not measured.   PAIN:  Are you having pain? No  PRECAUTIONS: Other: at risk of lymphedema  WEIGHT BEARING RESTRICTIONS: No  FALLS:  Has patient fallen in last 6 months? 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 on treadmill, walking  HAND DOMINANCE: right   PRIOR LEVEL OF FUNCTION: Independent  PATIENT GOALS: to increase ROM, decrease pain   OBJECTIVE:  COGNITION: Overall cognitive status: Within functional limits for tasks assessed   PALPATION: Cording palpable from axilla extending down towards forearm  OBSERVATIONS / OTHER ASSESSMENTS: numerous cords visible in upper arm when pt abducts UE  POSTURE: forward head and rounded shoulders  UPPER EXTREMITY AROM/PROM:  A/PROM RIGHT   eval   Shoulder extension 56  Shoulder flexion 136  Shoulder abduction 107  Shoulder internal rotation 54  Shoulder external rotation 83    (Blank rows = not tested)  A/PROM LEFT   eval  Shoulder extension 55  Shoulder flexion 166  Shoulder abduction 165  Shoulder internal rotation 61  Shoulder external rotation 91    (Blank rows = not tested)   LYMPHEDEMA ASSESSMENTS:   SURGERY TYPE/DATE: R lumpectomy and SLNB 10/28/21  NUMBER OF LYMPH NODES REMOVED: 5  CHEMOTHERAPY: completed 01/29/22  RADIATION: completed 03/18/22  HORMONE TREATMENT: currently on tamoxifen  INFECTIONS: none  LYMPHEDEMA ASSESSMENTS:   LANDMARK RIGHT  eval  10 cm proximal to olecranon process 26  Olecranon process 24.4  10 cm proximal to ulnar styloid process 19.2  Just proximal to ulnar styloid process 15.6   Across hand at thumb web space 18.5  At base of 2nd digit 5.9  (Blank rows = not tested)  LANDMARK LEFT  eval  10 cm proximal to olecranon process 25.8  Olecranon process 24  10 cm proximal to ulnar styloid process 19  Just proximal to ulnar styloid process 15.3  Across hand at thumb web space 18.4  At base of 2nd digit 5.5  (Blank rows = not tested)   L-DEX LYMPHEDEMA SCREENING:  The patient was assessed using the L-Dex machine today to produce a lymphedema index baseline score. The patient will be reassessed on a regular basis (typically every 3 months) to obtain new L-Dex scores. If the score is > 6.5 points away from his/her baseline score indicating onset of subclinical lymphedema, it will be recommended to wear a compression garment for 4 weeks, 12 hours per day and then be reassessed. If the score continues to be > 6.5 points from baseline at reassessment, we will initiate lymphedema treatment. Assessing in this manner has a 95% rate of preventing clinically significant lymphedema.        The patient was assessed using the L-Dex machine today to produce a lymphedema index baseline score. The patient will be reassessed on a regular basis (typically every 3 months) to obtain new L-Dex scores. If the score is > 6.5 points away from his/her baseline score indicating onset of  subclinical lymphedema, it will be recommended to wear a compression garment for 4 weeks, 12 hours per day and then be reassessed. If the score continues to be > 6.5 points from baseline at reassessment, we will initiate lymphedema treatment. Assessing in this manner has a 95% rate of preventing clinically significant lymphedema.  QUICK DASH SURVEY:       TODAY'S TREATMENT:                                                                                                                                         DATE:  04/07/22: Gentle MFR to cording throughout R UE followed by MLD: In supine: Short neck, 5  diaphragmatic breaths, L axillary nodes and establishment of interaxillary pathway, R inguinal nodes and establishment of axilloinguinal pathway, then R UE working proximal to distal, moving inner upper arm outwards and upwards, and doing both sides of forearms retracing all steps. Instructed pt throughout in basic principles of MLD and need for correct skin stretch. Had pt return demonstrate entire sequence while therapist provided verbal and tactile cues. Pt was able to demonstrate correct skin stretch and only needed occasional cueing for speed and pressure. Issued handout.   04/03/22: Gentle MFR to cording throughout R UE followed by MLD: In supine: Short neck, 5 diaphragmatic breaths, L axillary nodes and establishment of interaxillary pathway, R inguinal nodes and establishment of axilloinguinal pathway, then R UE working proximal to distal, moving inner upper arm outwards and upwards, and doing both sides of forearms retracing all steps. Instructed pt throughout in basic principles of MLD and need for correct skin stretch.    PATIENT EDUCATION:  Education details: post op exercises, anatomy and physiology of the lymphatic system, basic principles of MLD, subclinical lymphedema, wear sleeve 12hrs/day for 4 weeks and will recheck SOZO Person educated: Patient Education method: Explanation Education comprehension: verbalized understanding  HOME EXERCISE PROGRAM: Post op breast exercises Stretching in to cording  ASSESSMENT:  CLINICAL IMPRESSION: Instructed pt in correct technique for self MLD and had her return demonstrate entire sequence while therapist provided verbal and tactile cues. Pt demonstrated good skin stretch only needing occasional cues for speed and pressure. Educated pt to continue stretching to help with cording but that MLD also is beneficial for cording.   OBJECTIVE IMPAIRMENTS: decreased knowledge of use of DME, decreased ROM, increased edema, increased fascial restrictions,  impaired UE functional use, postural dysfunction, and pain.   ACTIVITY LIMITATIONS: carrying, lifting, and reach over head  PARTICIPATION LIMITATIONS:  none  PERSONAL FACTORS:  none  are also affecting patient's functional outcome.   REHAB POTENTIAL: Good  CLINICAL DECISION MAKING: Stable/uncomplicated  EVALUATION COMPLEXITY: Low  GOALS: Goals reviewed with patient? Yes  SHORT TERM GOALS=LONG TERM GOALS Target date: 05/01/22  Pt will be independent in self MLD for long term management of edema.  Baseline: Goal status: INITIAL  2.  Pt will  demonstrate 165 degrees of R shoulder flexion to allow her to reach overhead. Baseline:  Goal status: INITIAL  3.  Pt will demonstrate 165 degrees of R shoulder abduction to allow her to reach out to the side.  Baseline:  Goal status: INITIAL  4.  Pt will return to the green on SOZO to decrease risk of long term lymphedema.  Baseline:  Goal status: INITIAL  5.  Pt will be independent in a home exercise program for continued stretching and strengthening.  Baseline:  Goal status: INITIAL   PLAN:  PT FREQUENCY: 2x/week  PT DURATION: 4 weeks  PLANNED INTERVENTIONS: Therapeutic exercises, Therapeutic activity, Patient/Family education, Self Care, Joint mobilization, Orthotic/Fit training, Manual lymph drainage, Compression bandaging, scar mobilization, Vasopneumatic device, Manual therapy, and Re-evaluation  PLAN FOR NEXT SESSION: iassess indep in MLD for RUE, repeat SOZO in one month from 04/03/22, PROM to R shoulder, pulleys, ball, MFR to cording   [employer], PT 04/07/2022, 5:13 PM

## 2022-04-07 NOTE — Patient Instructions (Signed)
Hug yourself- do 10 circles just behind the collar bones  Deep Effective Breath   Standing, sitting, or laying down, place both hands on the belly. Take a deep breath IN, expanding the belly; then breath OUT, contracting the belly. Repeat __5__ times. Do __2-3__ sessions per day and before your self massage.  http://[email or web address].exer.us/866   Copyright  VHI. All rights reserved.  Axilla to Axilla - Sweep   On uninvolved side make 5 circles in the armpit, then pump ( stretch skin)  _5__ times from involved armpit across chest to uninvolved armpit, making a pathway. Do _1__ time per day.  Copyright  VHI. All rights reserved.  Axilla to Inguinal Nodes - Sweep   On involved side, make 5 circles at groin at panty line, then pump _5__ times from armpit along side of trunk to outer hip, making your other pathway. Do __1_ time per day.  Copyright  VHI. All rights reserved.  Arm Posterior: Elbow to Shoulder - Sweep   Pump _5__ times from back of elbow to top of shoulder. Then inner to outer upper arm _5_ times, then outer arm again _5_ times. Then back to the pathways _2-3_ times. Do _1__ time per day.  Copyright  VHI. All rights reserved.  ARM: [place] Wrist to Elbow - Sweep   Pump or stationary circles _5__ times from wrist to elbow making sure to do both sides of the forearm. Then retrace your steps to the outer arm, and the pathways _2-3_ times each. Do _1__ time per day.  Copyright  VHI. All rights reserved.  ARM: Dorsum of Hand to Shoulder - Sweep   Pump or stationary circles _5__ times on back of hand including knuckle spaces and individual fingers if needed working up towards the wrist, then retrace all your steps working back up the forearm, doing both sides; upper outer arm and back to your pathways _2-3_ times each. Then do 5 circles again at uninvolved armpit and involved groin where you started! Good job!! Do __1_ time per day.  Copyright  VHI. All rights reserved.

## 2022-04-10 ENCOUNTER — Ambulatory Visit: Payer: 59

## 2022-04-10 DIAGNOSIS — L599 Disorder of the skin and subcutaneous tissue related to radiation, unspecified: Secondary | ICD-10-CM | POA: Diagnosis not present

## 2022-04-10 DIAGNOSIS — Z170 Estrogen receptor positive status [ER+]: Secondary | ICD-10-CM | POA: Diagnosis not present

## 2022-04-10 DIAGNOSIS — M25611 Stiffness of right shoulder, not elsewhere classified: Secondary | ICD-10-CM | POA: Diagnosis not present

## 2022-04-10 DIAGNOSIS — C50311 Malignant neoplasm of lower-inner quadrant of right female breast: Secondary | ICD-10-CM

## 2022-04-10 DIAGNOSIS — Z483 Aftercare following surgery for neoplasm: Secondary | ICD-10-CM | POA: Diagnosis not present

## 2022-04-10 DIAGNOSIS — R293 Abnormal posture: Secondary | ICD-10-CM | POA: Diagnosis not present

## 2022-04-10 DIAGNOSIS — I890 Lymphedema, not elsewhere classified: Secondary | ICD-10-CM

## 2022-04-10 NOTE — Therapy (Addendum)
OUTPATIENT PHYSICAL THERAPY ONCOLOGY TREATMENT  Patient Name: Adrienne Harris MRN: [id number] DOB:Aug 23, 1975, 47 y.o., female Today's Date: 04/10/2022  END OF SESSION:  PT End of Session - 04/10/22 0804     Visit Number 3    Number of Visits 9    Date for PT Re-Evaluation 05/01/22    PT Start Time 0801    PT Stop Time 0851    PT Time Calculation (min) 50 min    Activity Tolerance Patient tolerated treatment well    Behavior During Therapy [facility] for tasks assessed/performed             Past Medical History:  Diagnosis Date   Atopic dermatitis    Basal cell carcinoma    15 years ago.   Dysrhythmia    Hemochromatosis    Heterozygous for two genes   Palpitations    a. 04/2013 48h Holter: sinus rhythm/sinus arrhythmia, rare PAC's/PVC's.   PVC (premature ventricular contraction)    Shoulder pain, left 01/24/2015   Shoulder pain, right 01/24/2015   Past Surgical History:  Procedure Laterality Date   BREAST BIOPSY Right 09/25/2021   BREAST LUMPECTOMY WITH RADIOACTIVE SEED AND SENTINEL LYMPH NODE BIOPSY Right 10/28/2021   Procedure: RIGHT BREAST LUMPECTOMY WITH RADIOACTIVE SEED AND AXILLARY SENTINEL LYMPH NODE BIOPSY;  Surgeon: Rolm Bookbinder, MD;  Location: [place];  Service: General;  Laterality: Right;   COLONOSCOPY WITH PROPOFOL N/A 01/24/2021   Procedure: COLONOSCOPY WITH PROPOFOL;  Surgeon: [name], MD;  Location: [facility];  Service: Endoscopy;  Laterality: N/A;   Excision of basal cell carcinoma     PORT-A-CATH REMOVAL Left 03/13/2022   Procedure: REMOVAL PORT-A-CATH;  Surgeon: Rolm Bookbinder, MD;  Location: [place];  Service: General;  Laterality: Left;   PORTACATH PLACEMENT Left 11/19/2021   Procedure: INSERTION PORT-A-CATH;  Surgeon: Rolm Bookbinder, MD;  Location: [place];  Service: General;  Laterality: Left;   WISDOM TOOTH EXTRACTION     Patient Active Problem List   Diagnosis Date Noted    Genetic testing 10/24/2021   Malignant neoplasm of lower-inner quadrant of right breast of female, estrogen receptor positive ([place]) 10/01/2021   Colon cancer screening    Intractable menstrual migraine without status migrainosus 07/30/2020   Nonallopathic lesion of sacral region 12/30/2017   Trigger point of left shoulder region 10/06/2017   Slipped rib syndrome 08/05/2017   Nonallopathic lesion of rib cage 08/05/2017   Nonallopathic lesion of cervical region 08/05/2017   Nonallopathic lesion of thoracic region 08/05/2017   Nonallopathic lesion of lumbosacral region 08/05/2017   Chronic left shoulder pain 07/10/2017   Chronic LLQ pain 05/17/2017   Atopic dermatitis 07/10/2016   Hepatic cyst 07/10/2016   Routine physical examination 07/10/2016   PVC's (premature ventricular contractions) 04/22/2013   Hemochromatosis 04/22/2013    PCP: Mable Paris, FNP   REFERRING PROVIDER: Rolm Bookbinder, MD  REFERRING DIAG: C50.311 (ICD-10-CM) - Malignant neoplasm of lower-inner quadrant of right female breast Z17.0 (ICD-10-CM) - Estrogen receptor positive status (ER+)    THERAPY DIAG:  Stiffness of right shoulder, not elsewhere classified  Disorder of the skin and subcutaneous tissue related to radiation, unspecified  Lymphedema, not elsewhere classified  Aftercare following surgery for neoplasm  Abnormal posture  Malignant neoplasm of lower-inner quadrant of right breast of female, estrogen receptor positive ([place])  ONSET DATE: 01/28/23  Rationale for Evaluation and Treatment: Rehabilitation  SUBJECTIVE:  SUBJECTIVE STATEMENT: This cord hasn't been as intense as the last one was but it does still limit my end motions.   PERTINENT HISTORY:  Patient was diagnosed on 09/25/2021 with right grade 3  invasive mammary carcinoma of no particular type. She underwent a right lumpectomy and sentinel node biopsy on 09/28/2021. It is ER/PR positive and HER2 negative and the Ki67 was not measured.   PAIN:  Are you having pain? No, not currently. Just feel a pull, like a 2/10, at the end of my reach  PRECAUTIONS: Other: at risk of lymphedema  WEIGHT BEARING RESTRICTIONS: No  FALLS:  Has patient fallen in last 6 months? 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 at [employer]: 5- 6 days/ week 30-45 min on treadmill, walking  HAND DOMINANCE: right   PRIOR LEVEL OF FUNCTION: Independent  PATIENT GOALS: to increase ROM, decrease pain   OBJECTIVE:  COGNITION: Overall cognitive status: Within functional limits for tasks assessed   PALPATION: Cording palpable from axilla extending down towards forearm  OBSERVATIONS / OTHER ASSESSMENTS: numerous cords visible in upper arm when pt abducts UE  POSTURE: forward head and rounded shoulders  UPPER EXTREMITY AROM/PROM:  A/PROM RIGHT   eval   Shoulder extension 56  Shoulder flexion 136  Shoulder abduction 107  Shoulder internal rotation 54  Shoulder external rotation 83    (Blank rows = not tested)  A/PROM LEFT   eval  Shoulder extension 55  Shoulder flexion 166  Shoulder abduction 165  Shoulder internal rotation 61  Shoulder external rotation 91    (Blank rows = not tested)   LYMPHEDEMA ASSESSMENTS:   SURGERY TYPE/DATE: R lumpectomy and SLNB 10/28/21  NUMBER OF LYMPH NODES REMOVED: 5  CHEMOTHERAPY: completed 01/29/22  RADIATION: completed 03/18/22  HORMONE TREATMENT: currently on tamoxifen  INFECTIONS: none  LYMPHEDEMA ASSESSMENTS:   LANDMARK RIGHT  eval  10 cm proximal to olecranon process 26  Olecranon process  24.4  10 cm proximal to ulnar styloid process 19.2  Just proximal to ulnar styloid process 15.6  Across hand at thumb web space 18.5  At base of 2nd digit 5.9  (Blank rows = not tested)  LANDMARK LEFT  eval  10 cm proximal to olecranon process 25.8  Olecranon process 24  10 cm proximal to ulnar styloid process 19  Just proximal to ulnar styloid process 15.3  Across hand at thumb web space 18.4  At base of 2nd digit 5.5  (Blank rows = not tested)   L-DEX LYMPHEDEMA SCREENING:  The patient was assessed using the L-Dex machine today to produce a lymphedema index baseline score. The patient will be reassessed on a regular basis (typically every 3 months) to obtain new L-Dex scores. If the score is > 6.5 points away from his/her baseline score indicating onset of subclinical lymphedema, it will be recommended to wear a compression garment for 4 weeks, 12 hours per day and then be reassessed. If the score continues to be > 6.5 points from baseline at reassessment, we will initiate lymphedema treatment. Assessing in this manner has a 95% rate of preventing clinically significant lymphedema.        The patient was assessed using the L-Dex machine today to produce a lymphedema index baseline score. The patient will be reassessed on a regular basis (typically  every 3 months) to obtain new L-Dex scores. If the score is > 6.5 points away from his/her baseline score indicating onset of subclinical lymphedema, it will be recommended to wear a compression garment for 4 weeks, 12 hours per day and then be reassessed. If the score continues to be > 6.5 points from baseline at reassessment, we will initiate lymphedema treatment. Assessing in this manner has a 95% rate of preventing clinically significant lymphedema.  QUICK DASH SURVEY:       TODAY'S TREATMENT:                                                                                                                                          DATE:  04/10/22: Therapeutic Exercises Pulleys into flexion and abd x2 mins each with VC's to decrease Rt scapular compensation at end motions Ball roll up wall into flexion and Rt abd returning therapist demo x10 each Manual Therapy MLD: In supine: Short neck, 5 diaphragmatic breaths, Lt axillary nodes and establishment of anterior inter-axillary pathway, Rt inguinal nodes and establishment of Rt axillo-inguinal pathway, then Rt UE working proximal to distal, moving inner upper arm outwards and upwards, and doing both sides of forearms retracing all steps and reviewing with pt throughout MFR to cording throughout R UE, 1 pop felt in forearm inferior to antecubital fossa  04/07/22: Gentle MFR to cording throughout R UE followed by MLD: In supine: Short neck, 5 diaphragmatic breaths, L axillary nodes and establishment of interaxillary pathway, R inguinal nodes and establishment of axilloinguinal pathway, then R UE working proximal to distal, moving inner upper arm outwards and upwards, and doing both sides of forearms retracing all steps. Instructed pt throughout in basic principles of MLD and need for correct skin stretch. Had pt return demonstrate entire sequence while therapist provided verbal and tactile cues. Pt was able to demonstrate correct skin stretch and only needed occasional cueing for speed and pressure. Issued handout.   04/03/22: Gentle MFR to cording throughout R UE followed by MLD: In supine: Short neck, 5 diaphragmatic breaths, L axillary nodes and establishment of interaxillary pathway, R inguinal nodes and establishment of axilloinguinal pathway, then R UE working proximal to distal, moving inner upper arm outwards and upwards, and doing both sides of forearms retracing all steps. Instructed pt throughout in basic principles of MLD and need for correct skin stretch.    PATIENT EDUCATION:  Education details: post op exercises, anatomy and physiology of the lymphatic system, basic  principles of MLD, subclinical lymphedema, wear sleeve 12hrs/day for 4 weeks and will recheck SOZO Person educated: Patient Education method: Explanation Education comprehension: verbalized understanding  HOME EXERCISE PROGRAM: Post op breast exercises Stretching in to cording  ASSESSMENT:  CLINICAL IMPRESSION: Added AA/ROM stretches and pt required tactile and VC's to decrease Rt scapular compensation. Then continued with MLD and MFR. Reviewed MLD with pt with demonstration answering her  questions, then again while performing. She is able to verbalize a good understanding of basic principles of MLD after review.   OBJECTIVE IMPAIRMENTS: decreased knowledge of use of DME, decreased ROM, increased edema, increased fascial restrictions, impaired UE functional use, postural dysfunction, and pain.   ACTIVITY LIMITATIONS: carrying, lifting, and reach over head  PARTICIPATION LIMITATIONS:  none  PERSONAL FACTORS:  none  are also affecting patient's functional outcome.   REHAB POTENTIAL: Good  CLINICAL DECISION MAKING: Stable/uncomplicated  EVALUATION COMPLEXITY: Low  GOALS: Goals reviewed with patient? Yes  SHORT TERM GOALS=LONG TERM GOALS Target date: 05/01/22  Pt will be independent in self MLD for long term management of edema.  Baseline: Goal status: INITIAL  2.  Pt will demonstrate 165 degrees of R shoulder flexion to allow her to reach overhead. Baseline:  Goal status: INITIAL  3.  Pt will demonstrate 165 degrees of R shoulder abduction to allow her to reach out to the side.  Baseline:  Goal status: INITIAL  4.  Pt will return to the green on SOZO to decrease risk of long term lymphedema.  Baseline:  Goal status: INITIAL  5.  Pt will be independent in a home exercise program for continued stretching and strengthening.  Baseline:  Goal status: INITIAL   PLAN:  PT FREQUENCY: 2x/week  PT DURATION: 4 weeks  PLANNED INTERVENTIONS: Therapeutic exercises,  Therapeutic activity, Patient/Family education, Self Care, Joint mobilization, Orthotic/Fit training, Manual lymph drainage, Compression bandaging, scar mobilization, Vasopneumatic device, Manual therapy, and Re-evaluation  PLAN FOR NEXT SESSION: assess indep in MLD for RUE, repeat SOZO around 04/2122, PROM to R shoulder, pulleys, ball, MFR to cording   Otelia Limes, PTA 04/10/2022, 8:51 AM

## 2022-04-14 ENCOUNTER — Ambulatory Visit: Payer: 59

## 2022-04-15 ENCOUNTER — Ambulatory Visit: Payer: 59 | Attending: General Surgery

## 2022-04-15 ENCOUNTER — Other Ambulatory Visit: Payer: Self-pay

## 2022-04-15 DIAGNOSIS — C50311 Malignant neoplasm of lower-inner quadrant of right female breast: Secondary | ICD-10-CM | POA: Diagnosis not present

## 2022-04-15 DIAGNOSIS — L599 Disorder of the skin and subcutaneous tissue related to radiation, unspecified: Secondary | ICD-10-CM | POA: Diagnosis not present

## 2022-04-15 DIAGNOSIS — I890 Lymphedema, not elsewhere classified: Secondary | ICD-10-CM | POA: Diagnosis not present

## 2022-04-15 DIAGNOSIS — M25611 Stiffness of right shoulder, not elsewhere classified: Secondary | ICD-10-CM | POA: Insufficient documentation

## 2022-04-15 DIAGNOSIS — Z483 Aftercare following surgery for neoplasm: Secondary | ICD-10-CM | POA: Insufficient documentation

## 2022-04-15 DIAGNOSIS — R293 Abnormal posture: Secondary | ICD-10-CM | POA: Insufficient documentation

## 2022-04-15 DIAGNOSIS — Z170 Estrogen receptor positive status [ER+]: Secondary | ICD-10-CM | POA: Insufficient documentation

## 2022-04-15 NOTE — Patient Instructions (Signed)
3 Way Raises:      Starting Position:  Leaning against wall, walk feet a few inches away from the wall and make tummy tight (tuck hips underneath you) Press back/shoulders/head against wall as much as possible. Keep thumbs up to ceiling, elbows straight and shoulders relaxed/down throughout.  1. Lift arms in front to shoulder height 2. Lift arms a little wider into a "V" to shoulder height 3. Lift arms out to sides in a "T" to shoulder height  Perform 10 times in each direction. Hold 1-2 lbs to start with and work up to 2-3 sets of 10/day. Perform 3-4 times/week. Increase weight as able, decreasing sets of 10 each time you increase weights, then slowly working your way back up to 2-3 sets each time.    Cancer Rehab [phone]

## 2022-04-15 NOTE — Therapy (Signed)
OUTPATIENT PHYSICAL THERAPY ONCOLOGY TREATMENT  Patient Name: Adrienne Harris MRN: [id number] DOB:[birth year], 47 y.o., female Today's Date: 04/15/2022  END OF SESSION:  PT End of Session - 04/15/22 0908     Visit Number 4    Number of Visits 9    Date for PT Re-Evaluation 05/01/22    PT Start Time 0905    PT Stop Time 1000    PT Time Calculation (min) 55 min    Activity Tolerance Patient tolerated treatment well    Behavior During Therapy [facility] for tasks assessed/performed             Past Medical History:  Diagnosis Date   Atopic dermatitis    Basal cell carcinoma    15 years ago.   Dysrhythmia    Hemochromatosis    Heterozygous for two genes   Palpitations    a. 04/2013 48h Holter: sinus rhythm/sinus arrhythmia, rare PAC's/PVC's.   PVC (premature ventricular contraction)    Shoulder pain, left 01/24/2015   Shoulder pain, right 01/24/2015   Past Surgical History:  Procedure Laterality Date   BREAST BIOPSY Right 09/25/2021   BREAST LUMPECTOMY WITH RADIOACTIVE SEED AND SENTINEL LYMPH NODE BIOPSY Right 10/28/2021   Procedure: RIGHT BREAST LUMPECTOMY WITH RADIOACTIVE SEED AND AXILLARY SENTINEL LYMPH NODE BIOPSY;  Surgeon: Rolm Bookbinder, MD;  Location: [place];  Service: General;  Laterality: Right;   COLONOSCOPY WITH PROPOFOL N/A 01/24/2021   Procedure: COLONOSCOPY WITH PROPOFOL;  Surgeon: [name], MD;  Location: [facility];  Service: Endoscopy;  Laterality: N/A;   Excision of basal cell carcinoma     PORT-A-CATH REMOVAL Left 03/13/2022   Procedure: REMOVAL PORT-A-CATH;  Surgeon: Rolm Bookbinder, MD;  Location: [place];  Service: General;  Laterality: Left;   PORTACATH PLACEMENT Left 11/19/2021   Procedure: INSERTION PORT-A-CATH;  Surgeon: Rolm Bookbinder, MD;  Location: [place];  Service: General;  Laterality: Left;   WISDOM TOOTH EXTRACTION     Patient Active Problem List   Diagnosis Date Noted    Genetic testing 10/24/2021   Malignant neoplasm of lower-inner quadrant of right breast of female, estrogen receptor positive ([place]) 10/01/2021   Colon cancer screening    Intractable menstrual migraine without status migrainosus 07/30/2020   Nonallopathic lesion of sacral region 12/30/2017   Trigger point of left shoulder region 10/06/2017   Slipped rib syndrome 08/05/2017   Nonallopathic lesion of rib cage 08/05/2017   Nonallopathic lesion of cervical region 08/05/2017   Nonallopathic lesion of thoracic region 08/05/2017   Nonallopathic lesion of lumbosacral region 08/05/2017   Chronic left shoulder pain 07/10/2017   Chronic LLQ pain 05/17/2017   Atopic dermatitis 07/10/2016   Hepatic cyst 07/10/2016   Routine physical examination 07/10/2016   PVC's (premature ventricular contractions) 04/22/2013   Hemochromatosis 04/22/2013    PCP: Mable Paris, FNP   REFERRING PROVIDER: Rolm Bookbinder, MD  REFERRING DIAG: C50.311 (ICD-10-CM) - Malignant neoplasm of lower-inner quadrant of right female breast Z17.0 (ICD-10-CM) - Estrogen receptor positive status (ER+)    THERAPY DIAG:  Stiffness of right shoulder, not elsewhere classified  Disorder of the skin and subcutaneous tissue related to radiation, unspecified  Lymphedema, not elsewhere classified  Aftercare following surgery for neoplasm  Abnormal posture  Malignant neoplasm of lower-inner quadrant of right breast of female, estrogen receptor positive ([place])  ONSET DATE: 01/28/23  Rationale for Evaluation and Treatment: Rehabilitation  SUBJECTIVE:  SUBJECTIVE STATEMENT: My Rt shoulder is bothering me some today, not sure why though.   PERTINENT HISTORY:  Patient was diagnosed on 09/25/2021 with right grade 3 invasive mammary carcinoma  of no particular type. She underwent a right lumpectomy and sentinel node biopsy on 09/28/2021. It is ER/PR positive and HER2 negative and the Ki67 was not measured.   PAIN:  PAIN:  Are you having pain? Yes NPRS scale: 2/10 Pain location: shoulder Pain orientation: Right  PAIN TYPE: aching Pain description: constant  Aggravating factors: not sure  Relieving factors: it's just there   PRECAUTIONS: Other: at risk of lymphedema  WEIGHT BEARING RESTRICTIONS: No  FALLS:  Has patient fallen in last 6 months? 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 at [employer]: 5- 6 days/ week 30-45 min on treadmill, walking  HAND DOMINANCE: right   PRIOR LEVEL OF FUNCTION: Independent  PATIENT GOALS: to increase ROM, decrease pain   OBJECTIVE:  COGNITION: Overall cognitive status: Within functional limits for tasks assessed   PALPATION: Cording palpable from axilla extending down towards forearm  OBSERVATIONS / OTHER ASSESSMENTS: numerous cords visible in upper arm when pt abducts UE  POSTURE: forward head and rounded shoulders  UPPER EXTREMITY AROM/PROM:  A/PROM RIGHT   eval   Shoulder extension 56  Shoulder flexion 136  Shoulder abduction 107  Shoulder internal rotation 54  Shoulder external rotation 83    (Blank rows = not tested)  A/PROM LEFT   eval  Shoulder extension 55  Shoulder flexion 166  Shoulder abduction 165  Shoulder internal rotation 61  Shoulder external rotation 91    (Blank rows = not tested)   LYMPHEDEMA ASSESSMENTS:   SURGERY TYPE/DATE: R lumpectomy and SLNB 10/28/21  NUMBER OF LYMPH NODES REMOVED: 5  CHEMOTHERAPY: completed 01/29/22  RADIATION: completed 03/18/22  HORMONE TREATMENT: currently on tamoxifen  INFECTIONS: none  LYMPHEDEMA  ASSESSMENTS:   LANDMARK RIGHT  eval  10 cm proximal to olecranon process 26  Olecranon process 24.4  10 cm proximal to ulnar styloid process 19.2  Just proximal to ulnar styloid process 15.6  Across hand at thumb web space 18.5  At base of 2nd digit 5.9  (Blank rows = not tested)  LANDMARK LEFT  eval  10 cm proximal to olecranon process 25.8  Olecranon process 24  10 cm proximal to ulnar styloid process 19  Just proximal to ulnar styloid process 15.3  Across hand at thumb web space 18.4  At base of 2nd digit 5.5  (Blank rows = not tested)   L-DEX LYMPHEDEMA SCREENING:  The patient was assessed using the L-Dex machine today to produce a lymphedema index baseline score. The patient will be reassessed on a regular basis (typically every 3 months) to obtain new L-Dex scores. If the score is > 6.5 points away from his/her baseline score indicating onset of subclinical lymphedema, it will be recommended to wear a compression garment for 4 weeks, 12 hours per day and then be reassessed. If the score continues to be > 6.5 points from baseline at reassessment, we will initiate lymphedema treatment. Assessing in this manner has a 95% rate of preventing clinically significant lymphedema.        The patient was assessed using the L-Dex machine today to produce a lymphedema index baseline score. The patient  will be reassessed on a regular basis (typically every 3 months) to obtain new L-Dex scores. If the score is > 6.5 points away from his/her baseline score indicating onset of subclinical lymphedema, it will be recommended to wear a compression garment for 4 weeks, 12 hours per day and then be reassessed. If the score continues to be > 6.5 points from baseline at reassessment, we will initiate lymphedema treatment. Assessing in this manner has a 95% rate of preventing clinically significant lymphedema.  QUICK DASH SURVEY:       TODAY'S TREATMENT:                                                                                                                                          DATE:  04/15/22: Therapeutic Exercises Pulleys into flexion and abd x2 mins each with VC's to decrease Rt scapular compensation at end motions Ball roll up wall into flexion and Rt abd with 1# on Rt wrist x10 each Bil UE 3 way raises with 2# for flexion, and 1# for scaption and abd x10 each returning therapist dem Modified downward dog on wall 5x, 5 sec holds  Manual Therapy MLD: In supine: Short neck, 5 diaphragmatic breaths, Lt axillary nodes and establishment of anterior inter-axillary pathway, Rt inguinal nodes and establishment of Rt axillo-inguinal pathway, then Rt UE working proximal to distal, moving inner upper arm outwards and upwards, and doing both sides of forearms retracing all steps and reviewing with pt throughout MFR to cording throughout R UE  04/10/22: Therapeutic Exercises Pulleys into flexion and abd x2 mins each with VC's to decrease Rt scapular compensation at end motions Ball roll up wall into flexion and Rt abd returning therapist demo x10 each Manual Therapy MLD: In supine: Short neck, 5 diaphragmatic breaths, Lt axillary nodes and establishment of anterior inter-axillary pathway, Rt inguinal nodes and establishment of Rt axillo-inguinal pathway, then Rt UE working proximal to distal, moving inner upper arm outwards and upwards, and doing both sides of forearms retracing all steps and reviewing with pt throughout MFR to cording throughout R UE, 1 pop felt in forearm inferior to antecubital fossa  04/07/22: Gentle MFR to cording throughout R UE followed by MLD: In supine: Short neck, 5 diaphragmatic breaths, L axillary nodes and establishment of interaxillary pathway, R inguinal nodes and establishment of axilloinguinal pathway, then R UE working proximal to distal, moving inner upper arm outwards and upwards, and doing both sides of forearms retracing all steps. Instructed pt  throughout in basic principles of MLD and need for correct skin stretch. Had pt return demonstrate entire sequence while therapist provided verbal and tactile cues. Pt was able to demonstrate correct skin stretch and only needed occasional cueing for speed and pressure. Issued handout.     PATIENT EDUCATION:  Education details: Bil UE 3 way raises Person educated: Patient Education method: Explanation, Media planner, handout issued Education  comprehension: verbalized understanding, returned demonstration, pt will benefit from further review  HOME EXERCISE PROGRAM: Post op breast exercises Stretching in to cording Bil UE 3 way raises starting with 1# ASSESSMENT:  CLINICAL IMPRESSION: Continued with AA/ROM stretches and progressed pt to include strength with 1# weights. She tolerated this well and was challenged by these. Educated her how to progress with "low and slow" mindset to keep risk of progressing lymphedema low. She was able to verbalize good understanding. Then continued with manual therapy.   OBJECTIVE IMPAIRMENTS: decreased knowledge of use of DME, decreased ROM, increased edema, increased fascial restrictions, impaired UE functional use, postural dysfunction, and pain.   ACTIVITY LIMITATIONS: carrying, lifting, and reach over head  PARTICIPATION LIMITATIONS:  none  PERSONAL FACTORS:  none  are also affecting patient's functional outcome.   REHAB POTENTIAL: Good  CLINICAL DECISION MAKING: Stable/uncomplicated  EVALUATION COMPLEXITY: Low  GOALS: Goals reviewed with patient? Yes  SHORT TERM GOALS=LONG TERM GOALS Target date: 05/01/22  Pt will be independent in self MLD for long term management of edema.  Baseline: Goal status: INITIAL  2.  Pt will demonstrate 165 degrees of R shoulder flexion to allow her to reach overhead. Baseline:  Goal status: INITIAL  3.  Pt will demonstrate 165 degrees of R shoulder abduction to allow her to reach out to the side.   Baseline:  Goal status: INITIAL  4.  Pt will return to the green on SOZO to decrease risk of long term lymphedema.  Baseline:  Goal status: INITIAL  5.  Pt will be independent in a home exercise program for continued stretching and strengthening.  Baseline:  Goal status: INITIAL   PLAN:  PT FREQUENCY: 2x/week  PT DURATION: 4 weeks  PLANNED INTERVENTIONS: Therapeutic exercises, Therapeutic activity, Patient/Family education, Self Care, Joint mobilization, Orthotic/Fit training, Manual lymph drainage, Compression bandaging, scar mobilization, Vasopneumatic device, Manual therapy, and Re-evaluation  PLAN FOR NEXT SESSION: repeat SOZO around 05/01/22, PROM to R shoulder, pulleys, ball, MFR to cording; how are new HEP ?   Otelia Limes, PTA 04/15/2022, 10:05 AM  3 Way Raises:      Starting Position:  Leaning against wall, walk feet a few inches away from the wall and make tummy tight (tuck hips underneath you) Press back/shoulders/head against wall as much as possible. Keep thumbs up to ceiling, elbows straight and shoulders relaxed/down throughout.  1. Lift arms in front to shoulder height 2. Lift arms a little wider into a "V" to shoulder height 3. Lift arms out to sides in a "T" to shoulder height  Perform 10 times in each direction. Hold 1-2 lbs to start with and work up to 2-3 sets of 10/day. Perform 3-4 times/week. Increase weight as able, decreasing sets of 10 each time you increase weights, then slowly working your way back up to 2-3 sets each time.    Cancer Rehab [phone]

## 2022-04-17 ENCOUNTER — Telehealth: Payer: Self-pay

## 2022-04-17 ENCOUNTER — Ambulatory Visit
Admission: RE | Admit: 2022-04-17 | Discharge: 2022-04-17 | Disposition: A | Discharge status: Home / Self Care | Payer: 59 | Source: Ambulatory Visit | Attending: Radiation Oncology | Admitting: Radiation Oncology

## 2022-04-17 ENCOUNTER — Encounter: Payer: Self-pay | Admitting: Radiation Oncology

## 2022-04-17 NOTE — Telephone Encounter (Signed)
Adrienne Harris was called today for follow-up after completing radiation for breast cancer.   Pain: denies, reports nipple pain has improved Skin: reports her skin has healed up well with no complications ROM: no issues to report Lymphedema: no issues to report MedOnc F/U: Dr. Janese Banks on 06-13-22 Other issues of note: Vaginal d/c (no odor), reports this a side effect of the Tamoxifen  Pt reports Yes No Comments  Tamoxifen '[x]'$  '[]'$  '20mg'$   Letrozole '[]'$  '[]'$    Anastrazole '[]'$  '[]'$    Mammogram '[]'$  Date:  '[]'$     Pt was doing well with this follow up phone call. She was educated on the importance of yearly mammograms and the use of Vitamin E cream/ lotion as well. She was very appreciative of the care she received during her radiation therapy.  She reported she would call with any questions or concerns.

## 2022-04-18 ENCOUNTER — Encounter: Payer: Self-pay | Admitting: Rehabilitation

## 2022-04-18 ENCOUNTER — Ambulatory Visit: Payer: 59 | Admitting: Rehabilitation

## 2022-04-18 DIAGNOSIS — R293 Abnormal posture: Secondary | ICD-10-CM

## 2022-04-18 DIAGNOSIS — Z170 Estrogen receptor positive status [ER+]: Secondary | ICD-10-CM | POA: Diagnosis not present

## 2022-04-18 DIAGNOSIS — C50311 Malignant neoplasm of lower-inner quadrant of right female breast: Secondary | ICD-10-CM | POA: Diagnosis not present

## 2022-04-18 DIAGNOSIS — Z483 Aftercare following surgery for neoplasm: Secondary | ICD-10-CM

## 2022-04-18 DIAGNOSIS — L599 Disorder of the skin and subcutaneous tissue related to radiation, unspecified: Secondary | ICD-10-CM | POA: Diagnosis not present

## 2022-04-18 DIAGNOSIS — I890 Lymphedema, not elsewhere classified: Secondary | ICD-10-CM | POA: Diagnosis not present

## 2022-04-18 DIAGNOSIS — M25611 Stiffness of right shoulder, not elsewhere classified: Secondary | ICD-10-CM | POA: Diagnosis not present

## 2022-04-18 NOTE — Therapy (Signed)
OUTPATIENT PHYSICAL THERAPY ONCOLOGY TREATMENT  Patient Name: Adrienne Harris MRN: [id number] DOB:06-02-1975, 47 y.o., female Today's Date: 04/18/2022  END OF SESSION:  PT End of Session - 04/18/22 0851     Visit Number 5    Number of Visits 9    Date for PT Re-Evaluation 05/01/22    PT Start Time 0855    PT Stop Time 0950    PT Time Calculation (min) 55 min    Activity Tolerance Patient tolerated treatment well    Behavior During Therapy [facility] for tasks assessed/performed              Past Medical History:  Diagnosis Date   Atopic dermatitis    Basal cell carcinoma    15 years ago.   Dysrhythmia    Hemochromatosis    Heterozygous for two genes   Palpitations    a. 04/2013 48h Holter: sinus rhythm/sinus arrhythmia, rare PAC's/PVC's.   PVC (premature ventricular contraction)    Shoulder pain, left 01/24/2015   Shoulder pain, right 01/24/2015   Past Surgical History:  Procedure Laterality Date   BREAST BIOPSY Right 09/25/2021   BREAST LUMPECTOMY WITH RADIOACTIVE SEED AND SENTINEL LYMPH NODE BIOPSY Right 10/28/2021   Procedure: RIGHT BREAST LUMPECTOMY WITH RADIOACTIVE SEED AND AXILLARY SENTINEL LYMPH NODE BIOPSY;  Surgeon: Rolm Bookbinder, MD;  Location: [place];  Service: General;  Laterality: Right;   COLONOSCOPY WITH PROPOFOL N/A 01/24/2021   Procedure: COLONOSCOPY WITH PROPOFOL;  Surgeon: [name], MD;  Location: [facility];  Service: Endoscopy;  Laterality: N/A;   Excision of basal cell carcinoma     PORT-A-CATH REMOVAL Left 03/13/2022   Procedure: REMOVAL PORT-A-CATH;  Surgeon: Rolm Bookbinder, MD;  Location: [place];  Service: General;  Laterality: Left;   PORTACATH PLACEMENT Left 11/19/2021   Procedure: INSERTION PORT-A-CATH;  Surgeon: Rolm Bookbinder, MD;  Location: [place];  Service: General;  Laterality: Left;   WISDOM TOOTH EXTRACTION     Patient Active Problem List   Diagnosis Date Noted    Genetic testing 10/24/2021   Malignant neoplasm of lower-inner quadrant of right breast of female, estrogen receptor positive ([place]) 10/01/2021   Colon cancer screening    Intractable menstrual migraine without status migrainosus 07/30/2020   Nonallopathic lesion of sacral region 12/30/2017   Trigger point of left shoulder region 10/06/2017   Slipped rib syndrome 08/05/2017   Nonallopathic lesion of rib cage 08/05/2017   Nonallopathic lesion of cervical region 08/05/2017   Nonallopathic lesion of thoracic region 08/05/2017   Nonallopathic lesion of lumbosacral region 08/05/2017   Chronic left shoulder pain 07/10/2017   Chronic LLQ pain 05/17/2017   Atopic dermatitis 07/10/2016   Hepatic cyst 07/10/2016   Routine physical examination 07/10/2016   PVC's (premature ventricular contractions) 04/22/2013   Hemochromatosis 04/22/2013    PCP: Mable Paris, FNP   REFERRING PROVIDER: Rolm Bookbinder, MD  REFERRING DIAG: C50.311 (ICD-10-CM) - Malignant neoplasm of lower-inner quadrant of right female breast Z17.0 (ICD-10-CM) - Estrogen receptor positive status (ER+)    THERAPY DIAG:  Stiffness of right shoulder, not elsewhere classified  Disorder of the skin and subcutaneous tissue related to radiation, unspecified  Lymphedema, not elsewhere classified  Aftercare following surgery for neoplasm  Abnormal posture  Malignant neoplasm of lower-inner quadrant of right breast of female, estrogen receptor positive ([facility])  ONSET DATE: 01/28/23  Rationale for Evaluation and Treatment: Rehabilitation  SUBJECTIVE:  SUBJECTIVE STATEMENT: I am doing well.    PERTINENT HISTORY:  Patient was diagnosed on 09/25/2021 with right grade 3 invasive mammary carcinoma of no particular type. She underwent a right  lumpectomy and sentinel node biopsy on 09/28/2021. It is ER/PR positive and HER2 negative and the Ki67 was not measured.   PAIN:  PAIN:  Are you having pain? No  PRECAUTIONS: Other: at risk of lymphedema  WEIGHT BEARING RESTRICTIONS: No  FALLS:  Has patient fallen in last 6 months? 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- 6 days/ week 30-45 min on treadmill, walking  HAND DOMINANCE: right   PRIOR LEVEL OF FUNCTION: Independent  PATIENT GOALS: to increase ROM, decrease pain   OBJECTIVE:  COGNITION: Overall cognitive status: Within functional limits for tasks assessed   PALPATION: Cording palpable from axilla extending down towards forearm  OBSERVATIONS / OTHER ASSESSMENTS: numerous cords visible in upper arm when pt abducts UE  POSTURE: forward head and rounded shoulders  UPPER EXTREMITY AROM/PROM:  A/PROM RIGHT   eval   Shoulder extension 56  Shoulder flexion 136  Shoulder abduction 107  Shoulder internal rotation 54  Shoulder external rotation 83    (Blank rows = not tested)  A/PROM LEFT   eval  Shoulder extension 55  Shoulder flexion 166  Shoulder abduction 165  Shoulder internal rotation 61  Shoulder external rotation 91    (Blank rows = not tested)   LYMPHEDEMA ASSESSMENTS:  SURGERY TYPE/DATE: R lumpectomy and SLNB 10/28/21 NUMBER OF LYMPH NODES REMOVED: 5 CHEMOTHERAPY: completed 01/29/22 RADIATION: completed 03/18/22 HORMONE TREATMENT: currently on tamoxifen INFECTIONS: none  LYMPHEDEMA ASSESSMENTS:   LANDMARK RIGHT  eval  10 cm proximal to olecranon process 26  Olecranon process 24.4  10 cm proximal to ulnar styloid process 19.2  Just proximal to ulnar styloid process 15.6  Across hand at thumb web space 18.5  At base of  2nd digit 5.9  (Blank rows = not tested)  LANDMARK LEFT  eval  10 cm proximal to olecranon process 25.8  Olecranon process 24  10 cm proximal to ulnar styloid process 19  Just proximal to ulnar styloid process 15.3  Across hand at thumb web space 18.4  At base of 2nd digit 5.5  (Blank rows = not tested)   TODAY'S TREATMENT:                                                                                                                                         DATE:  04/18/22: Therapeutic Exercises Pulleys into flexion and abd x2 mins  Ball roll up wall into flexion and Rt abd with 1# on Rt wrist x10 each Bil UE 3 way raises  with 2# for flexion, and 1# for scaption and abd x10 each Bicep curls 2# x 10 Yellow row x 10 Manual Therapy MLD: In supine: Short neck, 5 diaphragmatic breaths, Lt axillary nodes and establishment of anterior inter-axillary pathway, Rt inguinal nodes and establishment of Rt axillo-inguinal pathway, then Rt UE working proximal to distal, moving inner upper arm outwards and upwards, and doing both sides of forearms retracing all steps and reviewing with pt throughout MFR to cording throughout R UE  04/15/22: Therapeutic Exercises Pulleys into flexion and abd x2 mins each with VC's to decrease Rt scapular compensation at end motions Ball roll up wall into flexion and Rt abd with 1# on Rt wrist x10 each Bil UE 3 way raises with 2# for flexion, and 1# for scaption and abd x10 each returning therapist dem Modified downward dog on wall 5x, 5 sec holds  Manual Therapy MLD: In supine: Short neck, 5 diaphragmatic breaths, Lt axillary nodes and establishment of anterior inter-axillary pathway, Rt inguinal nodes and establishment of Rt axillo-inguinal pathway, then Rt UE working proximal to distal, moving inner upper arm outwards and upwards, and doing both sides of forearms retracing all steps and reviewing with pt throughout MFR to cording throughout R  UE  04/10/22: Therapeutic Exercises Pulleys into flexion and abd x2 mins each with VC's to decrease Rt scapular compensation at end motions Ball roll up wall into flexion and Rt abd returning therapist demo x10 each Manual Therapy MLD: In supine: Short neck, 5 diaphragmatic breaths, Lt axillary nodes and establishment of anterior inter-axillary pathway, Rt inguinal nodes and establishment of Rt axillo-inguinal pathway, then Rt UE working proximal to distal, moving inner upper arm outwards and upwards, and doing both sides of forearms retracing all steps and reviewing with pt throughout MFR to cording throughout R UE, 1 pop felt in forearm inferior to antecubital fossa  PATIENT EDUCATION:  Education details: Bil UE 3 way raises Person educated: Patient Education method: Explanation, Demonstration, handout issued Education comprehension: verbalized understanding, returned demonstration, pt will benefit from further review  HOME EXERCISE PROGRAM: Post op breast exercises Stretching in to cording Bil UE 3 way raises starting with 1#  ASSESSMENT: CLINICAL IMPRESSION: Pt is making progress and doing well.  Continued POC without significant changes.   OBJECTIVE IMPAIRMENTS: decreased knowledge of use of DME, decreased ROM, increased edema, increased fascial restrictions, impaired UE functional use, postural dysfunction, and pain.   ACTIVITY LIMITATIONS: carrying, lifting, and reach over head  PARTICIPATION LIMITATIONS:  none  PERSONAL FACTORS:  none  are also affecting patient's functional outcome.   REHAB POTENTIAL: Good  CLINICAL DECISION MAKING: Stable/uncomplicated  EVALUATION COMPLEXITY: Low  GOALS: Goals reviewed with patient? Yes  SHORT TERM GOALS=LONG TERM GOALS Target date: 05/01/22  Pt will be independent in self MLD for long term management of edema.  Baseline: Goal status: INITIAL  2.  Pt will demonstrate 165 degrees of R shoulder flexion to allow her to reach  overhead. Baseline:  Goal status: INITIAL  3.  Pt will demonstrate 165 degrees of R shoulder abduction to allow her to reach out to the side.  Baseline:  Goal status: INITIAL  4.  Pt will return to the green on SOZO to decrease risk of long term lymphedema.  Baseline:  Goal status: INITIAL  5.  Pt will be independent in a home exercise program for continued stretching and strengthening.  Baseline:  Goal status: INITIAL   PLAN:  PT FREQUENCY: 2x/week  PT DURATION: 4  weeks  PLANNED INTERVENTIONS: Therapeutic exercises, Therapeutic activity, Patient/Family education, Self Care, Joint mobilization, Orthotic/Fit training, Manual lymph drainage, Compression bandaging, scar mobilization, Vasopneumatic device, Manual therapy, and Re-evaluation  PLAN FOR NEXT SESSION: repeat SOZO around 05/01/22, PROM to R shoulder, pulleys, ball, MFR to cording; how are new HEP ?   Stark Bray, PT 04/18/2022, 9:50 AM  3 Way Raises:      Starting Position:  Leaning against wall, walk feet a few inches away from the wall and make tummy tight (tuck hips underneath you) Press back/shoulders/head against wall as much as possible. Keep thumbs up to ceiling, elbows straight and shoulders relaxed/down throughout.  1. Lift arms in front to shoulder height 2. Lift arms a little wider into a "V" to shoulder height 3. Lift arms out to sides in a "T" to shoulder height  Perform 10 times in each direction. Hold 1-2 lbs to start with and work up to 2-3 sets of 10/day. Perform 3-4 times/week. Increase weight as able, decreasing sets of 10 each time you increase weights, then slowly working your way back up to 2-3 sets each time.    Cancer Rehab [phone]

## 2022-04-21 ENCOUNTER — Ambulatory Visit: Payer: 59 | Admitting: Physical Therapy

## 2022-04-21 ENCOUNTER — Ambulatory Visit: Payer: Self-pay

## 2022-04-21 ENCOUNTER — Encounter: Payer: Self-pay | Admitting: Physical Therapy

## 2022-04-21 DIAGNOSIS — R293 Abnormal posture: Secondary | ICD-10-CM | POA: Diagnosis not present

## 2022-04-21 DIAGNOSIS — C50311 Malignant neoplasm of lower-inner quadrant of right female breast: Secondary | ICD-10-CM

## 2022-04-21 DIAGNOSIS — Z170 Estrogen receptor positive status [ER+]: Secondary | ICD-10-CM

## 2022-04-21 DIAGNOSIS — M25611 Stiffness of right shoulder, not elsewhere classified: Secondary | ICD-10-CM

## 2022-04-21 DIAGNOSIS — L599 Disorder of the skin and subcutaneous tissue related to radiation, unspecified: Secondary | ICD-10-CM | POA: Diagnosis not present

## 2022-04-21 DIAGNOSIS — Z483 Aftercare following surgery for neoplasm: Secondary | ICD-10-CM | POA: Diagnosis not present

## 2022-04-21 DIAGNOSIS — I890 Lymphedema, not elsewhere classified: Secondary | ICD-10-CM

## 2022-04-21 NOTE — Therapy (Signed)
OUTPATIENT PHYSICAL THERAPY ONCOLOGY TREATMENT  Patient Name: Adrienne Harris MRN: [id number] DOB:[birth year], 47 y.o., female Today's Date: 04/21/2022  END OF SESSION:  PT End of Session - 04/21/22 1652     Visit Number 6    Number of Visits 9    Date for PT Re-Evaluation 05/01/22    PT Start Time 1600    PT Stop Time 1652    PT Time Calculation (min) 52 min    Activity Tolerance Patient tolerated treatment well    Behavior During Therapy WFL for tasks assessed/performed               Past Medical History:  Diagnosis Date   Atopic dermatitis    Basal cell carcinoma    15 years ago.   Dysrhythmia    Hemochromatosis    Heterozygous for two genes   Palpitations    a. 04/2013 48h Holter: sinus rhythm/sinus arrhythmia, rare PAC's/PVC's.   PVC (premature ventricular contraction)    Shoulder pain, left 01/24/2015   Shoulder pain, right 01/24/2015   Past Surgical History:  Procedure Laterality Date   BREAST BIOPSY Right 09/25/2021   BREAST LUMPECTOMY WITH RADIOACTIVE SEED AND SENTINEL LYMPH NODE BIOPSY Right 10/28/2021   Procedure: RIGHT BREAST LUMPECTOMY WITH RADIOACTIVE SEED AND AXILLARY SENTINEL LYMPH NODE BIOPSY;  Surgeon: Rolm Bookbinder, MD;  Location: [place];  Service: General;  Laterality: Right;   COLONOSCOPY WITH PROPOFOL N/A 01/24/2021   Procedure: COLONOSCOPY WITH PROPOFOL;  Surgeon: [name], MD;  Location: [facility];  Service: Endoscopy;  Laterality: N/A;   Excision of basal cell carcinoma     PORT-A-CATH REMOVAL Left 03/13/2022   Procedure: REMOVAL PORT-A-CATH;  Surgeon: Rolm Bookbinder, MD;  Location: [place];  Service: General;  Laterality: Left;   PORTACATH PLACEMENT Left 11/19/2021   Procedure: INSERTION PORT-A-CATH;  Surgeon: Rolm Bookbinder, MD;  Location: [place];  Service: General;  Laterality: Left;   WISDOM TOOTH EXTRACTION     Patient Active Problem List   Diagnosis Date Noted    Genetic testing 10/24/2021   Malignant neoplasm of lower-inner quadrant of right breast of female, estrogen receptor positive ([place]) 10/01/2021   Colon cancer screening    Intractable menstrual migraine without status migrainosus 07/30/2020   Nonallopathic lesion of sacral region 12/30/2017   Trigger point of left shoulder region 10/06/2017   Slipped rib syndrome 08/05/2017   Nonallopathic lesion of rib cage 08/05/2017   Nonallopathic lesion of cervical region 08/05/2017   Nonallopathic lesion of thoracic region 08/05/2017   Nonallopathic lesion of lumbosacral region 08/05/2017   Chronic left shoulder pain 07/10/2017   Chronic LLQ pain 05/17/2017   Atopic dermatitis 07/10/2016   Hepatic cyst 07/10/2016   Routine physical examination 07/10/2016   PVC's (premature ventricular contractions) 04/22/2013   Hemochromatosis 04/22/2013    PCP: Mable Paris, FNP   REFERRING PROVIDER: Rolm Bookbinder, MD  REFERRING DIAG: C50.311 (ICD-10-CM) - Malignant neoplasm of lower-inner quadrant of right female breast Z17.0 (ICD-10-CM) - Estrogen receptor positive status (ER+)    THERAPY DIAG:  Stiffness of right shoulder, not elsewhere classified  Disorder of the skin and subcutaneous tissue related to radiation, unspecified  Lymphedema, not elsewhere classified  Aftercare following surgery for neoplasm  Abnormal posture  Malignant neoplasm of lower-inner quadrant of right breast of female, estrogen receptor positive ([place])  ONSET DATE: 01/28/23  Rationale for Evaluation and Treatment: Rehabilitation  SUBJECTIVE:  SUBJECTIVE STATEMENT: My ROM is improving but I still have soreness at my wrist.    PERTINENT HISTORY:  Patient was diagnosed on 09/25/2021 with right grade 3 invasive mammary carcinoma of no  particular type. She underwent a right lumpectomy and sentinel node biopsy on 09/28/2021. It is ER/PR positive and HER2 negative and the Ki67 was not measured.   PAIN:  PAIN:  Are you having pain? Yes, 2/10, R wrist, dull, tender to touch and with extension, nothing improves it  PRECAUTIONS: Other: at risk of lymphedema  WEIGHT BEARING RESTRICTIONS: No  FALLS:  Has patient fallen in last 6 months? 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 on treadmill, walking  HAND DOMINANCE: right   PRIOR LEVEL OF FUNCTION: Independent  PATIENT GOALS: to increase ROM, decrease pain   OBJECTIVE:  COGNITION: Overall cognitive status: Within functional limits for tasks assessed   PALPATION: Cording palpable from axilla extending down towards forearm  OBSERVATIONS / OTHER ASSESSMENTS: numerous cords visible in upper arm when pt abducts UE  POSTURE: forward head and rounded shoulders  UPPER EXTREMITY AROM/PROM:  A/PROM RIGHT   eval   Shoulder extension 56  Shoulder flexion 136  Shoulder abduction 107  Shoulder internal rotation 54  Shoulder external rotation 83    (Blank rows = not tested)  A/PROM LEFT   eval  Shoulder extension 55  Shoulder flexion 166  Shoulder abduction 165  Shoulder internal rotation 61  Shoulder external rotation 91    (Blank rows = not tested)   LYMPHEDEMA ASSESSMENTS:  SURGERY TYPE/DATE: R lumpectomy and SLNB 10/28/21 NUMBER OF LYMPH NODES REMOVED: 5 CHEMOTHERAPY: completed 01/29/22 RADIATION: completed 03/18/22 HORMONE TREATMENT: currently on tamoxifen INFECTIONS: none  LYMPHEDEMA ASSESSMENTS:   LANDMARK RIGHT  eval  10 cm proximal to olecranon process 26  Olecranon process 24.4  10 cm proximal to ulnar  styloid process 19.2  Just proximal to ulnar styloid process 15.6  Across hand at thumb web space 18.5  At base of 2nd digit 5.9  (Blank rows = not tested)  LANDMARK LEFT  eval  10 cm proximal to olecranon process 25.8  Olecranon process 24  10 cm proximal to ulnar styloid process 19  Just proximal to ulnar styloid process 15.3  Across hand at thumb web space 18.4  At base of 2nd digit 5.5  (Blank rows = not tested)   TODAY'S TREATMENT:                                                                                                                                         DATE:  04/21/22: Therapeutic Exercises Pulleys into flexion and abd x2 mins  Bil UE 3 way raises with 2# for flexion, scaption and abd x10 each Bicep curls 2# x 10 Tricep extension 2# x 10 Manual Therapy MLD: In supine: Short neck, 5 diaphragmatic breaths, Lt axillary nodes and establishment of anterior inter-axillary pathway, Rt inguinal nodes and establishment of Rt axillo-inguinal pathway, then Rt UE working proximal to distal, moving inner upper arm outwards and upwards, and doing both sides of forearms retracing all steps and reviewing with pt throughout MFR to cording throughout R UE with focus at wrist where there is discomfort and a palpable cord   04/18/22: Therapeutic Exercises Pulleys into flexion and abd x2 mins  Ball roll up wall into flexion and Rt abd with 1# on Rt wrist x10 each Bil UE 3 way raises with 2# for flexion, and 1# for scaption and abd x10 each Bicep curls 2# x 10 Yellow row x 10 Manual Therapy MLD: In supine: Short neck, 5 diaphragmatic breaths, Lt axillary nodes and establishment of anterior inter-axillary pathway, Rt inguinal nodes and establishment of Rt axillo-inguinal pathway, then Rt UE working proximal to distal, moving inner upper arm outwards and upwards, and doing both sides of forearms retracing all steps and reviewing with pt throughout MFR to cording throughout R  UE  04/15/22: Therapeutic Exercises Pulleys into flexion and abd x2 mins each with VC's to decrease Rt scapular compensation at end motions Ball roll up wall into flexion and Rt abd with 1# on Rt wrist x10 each Bil UE 3 way raises with 2# for flexion, and 1# for scaption and abd x10 each returning therapist dem Modified downward dog on wall 5x, 5 sec holds  Manual Therapy MLD: In supine: Short neck, 5 diaphragmatic breaths, Lt axillary nodes and establishment of anterior inter-axillary pathway, Rt inguinal nodes and establishment of Rt axillo-inguinal pathway, then Rt UE working proximal to distal, moving inner upper arm outwards and upwards, and doing both sides of forearms retracing all steps and reviewing with pt throughout MFR to cording throughout R UE  04/10/22: Therapeutic Exercises Pulleys into flexion and abd x2 mins each with VC's to decrease Rt scapular compensation at end motions Ball roll up wall into flexion and Rt abd returning therapist demo x10 each Manual Therapy MLD: In supine: Short neck, 5 diaphragmatic breaths, Lt axillary nodes and establishment of anterior inter-axillary pathway, Rt inguinal nodes and establishment of Rt axillo-inguinal pathway, then Rt UE working proximal to distal, moving inner upper arm outwards and upwards, and doing both sides of forearms retracing all steps and reviewing with pt throughout MFR to cording throughout R UE, 1 pop felt in forearm inferior to antecubital fossa  PATIENT EDUCATION:  Education details: Bil UE 3 way raises Person educated: Patient Education method: Explanation, Demonstration, handout issued Education comprehension: verbalized understanding, returned demonstration, pt will benefit from further review  HOME EXERCISE PROGRAM: Post op breast exercises Stretching in to cording Bil UE 3 way raises starting with 1#  ASSESSMENT: CLINICAL IMPRESSION: Continued with gentle strengthening today. Focused more at wrist for MFR  since the cording extends to this area and pt has increased discomfort here. Educated pt to keep her sleeve pulled down to cover her wrist. Ended with MLD to help with edema especially at wrist.   OBJECTIVE IMPAIRMENTS: decreased knowledge of use of DME, decreased ROM, increased edema, increased fascial restrictions, impaired UE functional use, postural dysfunction, and pain.   ACTIVITY LIMITATIONS: carrying, lifting, and reach over head  PARTICIPATION LIMITATIONS:  none  PERSONAL FACTORS:  none  are also affecting patient's functional outcome.   REHAB POTENTIAL: Good  CLINICAL DECISION MAKING: Stable/uncomplicated  EVALUATION COMPLEXITY: Low  GOALS: Goals reviewed with patient? Yes  SHORT TERM GOALS=LONG TERM GOALS Target date: 05/01/22  Pt will be independent in self MLD for long term management of edema.  Baseline: Goal status: INITIAL  2.  Pt will demonstrate 165 degrees of R shoulder flexion to allow her to reach overhead. Baseline:  Goal status: INITIAL  3.  Pt will demonstrate 165 degrees of R shoulder abduction to allow her to reach out to the side.  Baseline:  Goal status: INITIAL  4.  Pt will return to the green on SOZO to decrease risk of long term lymphedema.  Baseline:  Goal status: INITIAL  5.  Pt will be independent in a home exercise program for continued stretching and strengthening.  Baseline:  Goal status: INITIAL   PLAN:  PT FREQUENCY: 2x/week  PT DURATION: 4 weeks  PLANNED INTERVENTIONS: Therapeutic exercises, Therapeutic activity, Patient/Family education, Self Care, Joint mobilization, Orthotic/Fit training, Manual lymph drainage, Compression bandaging, scar mobilization, Vasopneumatic device, Manual therapy, and Re-evaluation  PLAN FOR NEXT SESSION: repeat SOZO around 05/01/22, PROM to R shoulder, pulleys, ball, MFR to cording; how are new HEP ?   Allyson Sabal Easley, PT 04/21/2022, 4:56 PM  3 Way Raises:      Starting Position:   Leaning against wall, walk feet a few inches away from the wall and make tummy tight (tuck hips underneath you) Press back/shoulders/head against wall as much as possible. Keep thumbs up to ceiling, elbows straight and shoulders relaxed/down throughout.  1. Lift arms in front to shoulder height 2. Lift arms a little wider into a "V" to shoulder height 3. Lift arms out to sides in a "T" to shoulder height  Perform 10 times in each direction. Hold 1-2 lbs to start with and work up to 2-3 sets of 10/day. Perform 3-4 times/week. Increase weight as able, decreasing sets of 10 each time you increase weights, then slowly working your way back up to 2-3 sets each time.    Cancer Rehab [phone]

## 2022-04-22 DIAGNOSIS — C50311 Malignant neoplasm of lower-inner quadrant of right female breast: Secondary | ICD-10-CM

## 2022-04-22 NOTE — Progress Notes (Signed)
Survivorship Care Plan visit completed.  Treatment summary reviewed and given to patient.  [facility] answers booklet reviewed and given to patient.  CARE program and Cancer Transitions discussed with patient along with other resources cancer center offers to patients and caregivers.  Patient verbalized understanding.    

## 2022-04-24 ENCOUNTER — Ambulatory Visit: Payer: 59

## 2022-04-24 DIAGNOSIS — L599 Disorder of the skin and subcutaneous tissue related to radiation, unspecified: Secondary | ICD-10-CM | POA: Diagnosis not present

## 2022-04-24 DIAGNOSIS — Z483 Aftercare following surgery for neoplasm: Secondary | ICD-10-CM | POA: Diagnosis not present

## 2022-04-24 DIAGNOSIS — I890 Lymphedema, not elsewhere classified: Secondary | ICD-10-CM

## 2022-04-24 DIAGNOSIS — R293 Abnormal posture: Secondary | ICD-10-CM

## 2022-04-24 DIAGNOSIS — M25611 Stiffness of right shoulder, not elsewhere classified: Secondary | ICD-10-CM | POA: Diagnosis not present

## 2022-04-24 DIAGNOSIS — Z170 Estrogen receptor positive status [ER+]: Secondary | ICD-10-CM | POA: Diagnosis not present

## 2022-04-24 DIAGNOSIS — C50311 Malignant neoplasm of lower-inner quadrant of right female breast: Secondary | ICD-10-CM | POA: Diagnosis not present

## 2022-04-24 NOTE — Therapy (Signed)
OUTPATIENT PHYSICAL THERAPY ONCOLOGY TREATMENT  Patient Name: Adrienne Harris MRN: [id number] DOB:14-Dec-1975, 47 y.o., female Today's Date: 04/24/2022  END OF SESSION:  PT End of Session - 04/24/22 0803     Visit Number 7    Number of Visits 9    Date for PT Re-Evaluation 05/01/22    PT Start Time 0801    PT Stop Time 0901    PT Time Calculation (min) 60 min    Activity Tolerance Patient tolerated treatment well    Behavior During Therapy [facility] for tasks assessed/performed               Past Medical History:  Diagnosis Date   Atopic dermatitis    Basal cell carcinoma    15 years ago.   Dysrhythmia    Hemochromatosis    Heterozygous for two genes   Palpitations    a. 04/2013 48h Holter: sinus rhythm/sinus arrhythmia, rare PAC's/PVC's.   PVC (premature ventricular contraction)    Shoulder pain, left 01/24/2015   Shoulder pain, right 01/24/2015   Past Surgical History:  Procedure Laterality Date   BREAST BIOPSY Right 09/25/2021   BREAST LUMPECTOMY WITH RADIOACTIVE SEED AND SENTINEL LYMPH NODE BIOPSY Right 10/28/2021   Procedure: RIGHT BREAST LUMPECTOMY WITH RADIOACTIVE SEED AND AXILLARY SENTINEL LYMPH NODE BIOPSY;  Surgeon: Adrienne Bookbinder, MD;  Location: [place];  Service: General;  Laterality: Right;   COLONOSCOPY WITH PROPOFOL N/A 01/24/2021   Procedure: COLONOSCOPY WITH PROPOFOL;  Surgeon: [name], MD;  Location: [facility];  Service: Endoscopy;  Laterality: N/A;   Excision of basal cell carcinoma     PORT-A-CATH REMOVAL Left 03/13/2022   Procedure: REMOVAL PORT-A-CATH;  Surgeon: Adrienne Bookbinder, MD;  Location: [place];  Service: General;  Laterality: Left;   PORTACATH PLACEMENT Left 11/19/2021   Procedure: INSERTION PORT-A-CATH;  Surgeon: Adrienne Bookbinder, MD;  Location: [place];  Service: General;  Laterality: Left;   WISDOM TOOTH EXTRACTION     Patient Active Problem List   Diagnosis Date Noted    Genetic testing 10/24/2021   Malignant neoplasm of lower-inner quadrant of right breast of female, estrogen receptor positive ([place]) 10/01/2021   Colon cancer screening    Intractable menstrual migraine without status migrainosus 07/30/2020   Nonallopathic lesion of sacral region 12/30/2017   Trigger point of left shoulder region 10/06/2017   Slipped rib syndrome 08/05/2017   Nonallopathic lesion of rib cage 08/05/2017   Nonallopathic lesion of cervical region 08/05/2017   Nonallopathic lesion of thoracic region 08/05/2017   Nonallopathic lesion of lumbosacral region 08/05/2017   Chronic left shoulder pain 07/10/2017   Chronic LLQ pain 05/17/2017   Atopic dermatitis 07/10/2016   Hepatic cyst 07/10/2016   Routine physical examination 07/10/2016   PVC's (premature ventricular contractions) 04/22/2013   Hemochromatosis 04/22/2013    PCP: Adrienne Paris, FNP   REFERRING PROVIDER: Rolm Bookbinder, MD  REFERRING DIAG: C50.311 (ICD-10-CM) - Malignant neoplasm of lower-inner quadrant of right female breast Z17.0 (ICD-10-CM) - Estrogen receptor positive status (ER+)    THERAPY DIAG:  Stiffness of right shoulder, not elsewhere classified  Disorder of the skin and subcutaneous tissue related to radiation, unspecified  Lymphedema, not elsewhere classified  Aftercare following surgery for neoplasm  Abnormal posture  Malignant neoplasm of lower-inner quadrant of right breast of female, estrogen receptor positive ([place])  ONSET DATE: 01/28/23  Rationale for Evaluation and Treatment: Rehabilitation  SUBJECTIVE:  SUBJECTIVE STATEMENT: I've been trying to keep my sleeve down over my wrist like Adrienne Harris showed me but I think it's just a little short so it keeps sliding above my ulnar styloid.   PERTINENT  HISTORY:  Patient was diagnosed on 09/25/2021 with right grade 3 invasive mammary carcinoma of no particular type. She underwent a right lumpectomy and sentinel node biopsy on 09/28/2021. It is ER/PR positive and HER2 negative and the Ki67 was not measured.   PAIN:  PAIN:  Are you having pain? Yes, 2/10, R wrist, dull, tender to touch and with extension, nothing improves it; just achy in the Rt axilla  PRECAUTIONS: Other: at risk of lymphedema  WEIGHT BEARING RESTRICTIONS: No  FALLS:  Has patient fallen in last 6 months? 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 at [employer]: 5- 6 days/ week 30-45 min on treadmill, walking  HAND DOMINANCE: right   PRIOR LEVEL OF FUNCTION: Independent  PATIENT GOALS: to increase ROM, decrease pain   OBJECTIVE:  COGNITION: Overall cognitive status: Within functional limits for tasks assessed   PALPATION: Cording palpable from axilla extending down towards forearm  OBSERVATIONS / OTHER ASSESSMENTS: numerous cords visible in upper arm when pt abducts UE  POSTURE: forward head and rounded shoulders  UPPER EXTREMITY AROM/PROM:  A/PROM RIGHT   eval   Shoulder extension 56  Shoulder flexion 136  Shoulder abduction 107  Shoulder internal rotation 54  Shoulder external rotation 83    (Blank rows = not tested)  A/PROM LEFT   eval  Shoulder extension 55  Shoulder flexion 166  Shoulder abduction 165  Shoulder internal rotation 61  Shoulder external rotation 91    (Blank rows = not tested)   LYMPHEDEMA ASSESSMENTS:  SURGERY TYPE/DATE: R lumpectomy and SLNB 10/28/21 NUMBER OF LYMPH NODES REMOVED: 5 CHEMOTHERAPY: completed 01/29/22 RADIATION: completed 03/18/22 HORMONE TREATMENT: currently on tamoxifen INFECTIONS: none  LYMPHEDEMA  ASSESSMENTS:   LANDMARK RIGHT  eval  10 cm proximal to olecranon process 26  Olecranon process 24.4  10 cm proximal to ulnar styloid process 19.2  Just proximal to ulnar styloid process 15.6  Across hand at thumb web space 18.5  At base of 2nd digit 5.9  (Blank rows = not tested)  LANDMARK LEFT  eval  10 cm proximal to olecranon process 25.8  Olecranon process 24  10 cm proximal to ulnar styloid process 19  Just proximal to ulnar styloid process 15.3  Across hand at thumb web space 18.4  At base of 2nd digit 5.5  (Blank rows = not tested)   TODAY'S TREATMENT:  DATE:  04/24/22: Therapeutic Exercises Pulleys into abd (reports not feeling a stretch with flexion) x2 mins  Roll yellow ball up wall into flexion x 10 and then Rt abd x 5 reps with VC's to decrease Rt scapular compensation  Bil UE 3 way raises with 2# for flexion, scaption and abd x10 each standing with core engaged for back, head and shoulders against wall Bicep curls 2# x 10 Tricep extension 2# x 10 Supine over half foam roll for  bil UE abd "snow angel" x5 with 10 sec holds (tried scaption but no stretch felt) Lt S/L for Rt UE open book stretch (horz abd) with wrist extension 5x 5 sec holds  Manual Therapy MLD: In supine: Short neck, 5 diaphragmatic breaths, Lt axillary nodes and establishment of anterior inter-axillary pathway, Rt inguinal nodes and establishment of Rt axillo-inguinal pathway, then Rt UE working proximal to distal, moving inner upper arm outwards and upwards, and doing both sides of forearms retracing all steps; also incorporated inferior breast tissue today and instructed pt in same as she is beginning to develop some mild peau d'orange with palpable edema at medial/inferior breast, also performed MLD along lateral anastomosis in Lt S/L and instructed pt in same as  well MFR to cording throughout R UE with focus at wrist where there is discomfort though unable to palpate a cord here today; also trunk stretch with Lt trunk rotation and with Rt UE in abd with MFR at axilla and inferior breast, instructed pt how she can replicate this at home holding under her headboard while lying supine and using contralateral hand to apply skin stretch inferior to breast as she reports feeling a good stretch in this position  STM to lumpectomy incision and SLNB incision where pt reports tightness felt at scar tissue  04/21/22: Therapeutic Exercises Pulleys into flexion and abd x2 mins  Bil UE 3 way raises with 2# for flexion, scaption and abd x10 each Bicep curls 2# x 10 Tricep extension 2# x 10 Manual Therapy MLD: In supine: Short neck, 5 diaphragmatic breaths, Lt axillary nodes and establishment of anterior inter-axillary pathway, Rt inguinal nodes and establishment of Rt axillo-inguinal pathway, then Rt UE working proximal to distal, moving inner upper arm outwards and upwards, and doing both sides of forearms retracing all steps and reviewing with pt throughout MFR to cording throughout R UE with focus at wrist where there is discomfort and a palpable cord   04/18/22: Therapeutic Exercises Pulleys into flexion and abd x2 mins  Ball roll up wall into flexion and Rt abd with 1# on Rt wrist x10 each Bil UE 3 way raises with 2# for flexion, and 1# for scaption and abd x10 each Bicep curls 2# x 10 Yellow row x 10 Manual Therapy MLD: In supine: Short neck, 5 diaphragmatic breaths, Lt axillary nodes and establishment of anterior inter-axillary pathway, Rt inguinal nodes and establishment of Rt axillo-inguinal pathway, then Rt UE working proximal to distal, moving inner upper arm outwards and upwards, and doing both sides of forearms retracing all steps and reviewing with pt throughout MFR to cording throughout R UE     PATIENT EDUCATION:  Education details: Bil UE 3 way  raises Person educated: Patient Education method: Explanation, Demonstration, handout issued Education comprehension: verbalized understanding, returned demonstration, pt will benefit from further review  HOME EXERCISE PROGRAM: Post op breast exercises Stretching in to cording Bil UE 3 way raises starting with 1#  ASSESSMENT: CLINICAL IMPRESSION: Continued with gentle strengthening today  and added stretches for Rt UE incorporating neural stretch as well trying to decrease Rt wrist cording. Then continued with manual therapy with focus on same and ending with MLD to decrease cording/edema at wrist. Encouraged pt to begin wearing her gauntlet as she does have some mild visible swelling at her latera wrist where sleeve slides up and this may also help to alleviate symptoms of cord pulling here as well. Pt able to verbalize good understanding.  OBJECTIVE IMPAIRMENTS: decreased knowledge of use of DME, decreased ROM, increased edema, increased fascial restrictions, impaired UE functional use, postural dysfunction, and pain.   ACTIVITY LIMITATIONS: carrying, lifting, and reach over head  PARTICIPATION LIMITATIONS:  none  PERSONAL FACTORS:  none  are also affecting patient's functional outcome.   REHAB POTENTIAL: Good  CLINICAL DECISION MAKING: Stable/uncomplicated  EVALUATION COMPLEXITY: Low  GOALS: Goals reviewed with patient? Yes  SHORT TERM GOALS=LONG TERM GOALS Target date: 05/01/22  Pt will be independent in self MLD for long term management of edema.  Baseline: Goal status: INITIAL  2.  Pt will demonstrate 165 degrees of R shoulder flexion to allow her to reach overhead. Baseline:  Goal status: INITIAL  3.  Pt will demonstrate 165 degrees of R shoulder abduction to allow her to reach out to the side.  Baseline:  Goal status: INITIAL  4.  Pt will return to the green on SOZO to decrease risk of long term lymphedema.  Baseline:  Goal status: INITIAL  5.  Pt will be  independent in a home exercise program for continued stretching and strengthening.  Baseline:  Goal status: INITIAL   PLAN:  PT FREQUENCY: 2x/week  PT DURATION: 4 weeks  PLANNED INTERVENTIONS: Therapeutic exercises, Therapeutic activity, Patient/Family education, Self Care, Joint mobilization, Orthotic/Fit training, Manual lymph drainage, Compression bandaging, scar mobilization, Vasopneumatic device, Manual therapy, and Re-evaluation  PLAN FOR NEXT SESSION: repeat SOZO around 05/01/22, PROM to R shoulder, pulleys, ball, MFR to cording; how are new HEP ?   Otelia Limes, PTA 04/24/2022, 10:25 AM  3 Way Raises:      Starting Position:  Leaning against wall, walk feet a few inches away from the wall and make tummy tight (tuck hips underneath you) Press back/shoulders/head against wall as much as possible. Keep thumbs up to ceiling, elbows straight and shoulders relaxed/down throughout.  1. Lift arms in front to shoulder height 2. Lift arms a little wider into a "V" to shoulder height 3. Lift arms out to sides in a "T" to shoulder height  Perform 10 times in each direction. Hold 1-2 lbs to start with and work up to 2-3 sets of 10/day. Perform 3-4 times/week. Increase weight as able, decreasing sets of 10 each time you increase weights, then slowly working your way back up to 2-3 sets each time.    Cancer Rehab [phone]

## 2022-04-28 ENCOUNTER — Ambulatory Visit: Payer: 59 | Admitting: Physical Therapy

## 2022-04-28 ENCOUNTER — Encounter: Payer: Self-pay | Admitting: Obstetrics and Gynecology

## 2022-04-28 ENCOUNTER — Encounter: Payer: Self-pay | Admitting: Physical Therapy

## 2022-04-28 DIAGNOSIS — L599 Disorder of the skin and subcutaneous tissue related to radiation, unspecified: Secondary | ICD-10-CM

## 2022-04-28 DIAGNOSIS — Z170 Estrogen receptor positive status [ER+]: Secondary | ICD-10-CM | POA: Diagnosis not present

## 2022-04-28 DIAGNOSIS — M25611 Stiffness of right shoulder, not elsewhere classified: Secondary | ICD-10-CM | POA: Diagnosis not present

## 2022-04-28 DIAGNOSIS — R293 Abnormal posture: Secondary | ICD-10-CM | POA: Diagnosis not present

## 2022-04-28 DIAGNOSIS — I890 Lymphedema, not elsewhere classified: Secondary | ICD-10-CM | POA: Diagnosis not present

## 2022-04-28 DIAGNOSIS — Z483 Aftercare following surgery for neoplasm: Secondary | ICD-10-CM | POA: Diagnosis not present

## 2022-04-28 DIAGNOSIS — C50311 Malignant neoplasm of lower-inner quadrant of right female breast: Secondary | ICD-10-CM | POA: Diagnosis not present

## 2022-04-28 NOTE — Therapy (Signed)
OUTPATIENT PHYSICAL THERAPY ONCOLOGY TREATMENT  Patient Name: Adrienne Harris MRN: [id number] DOB:[birth year], 47 y.o., female Today's Date: 04/28/2022  END OF SESSION:  PT End of Session - 04/28/22 1614     Visit Number 8    Number of Visits 9    Date for PT Re-Evaluation 05/01/22    PT Start Time 1605    PT Stop Time [id number]    PT Time Calculation (min) 53 min    Activity Tolerance Patient tolerated treatment well    Behavior During Therapy WFL for tasks assessed/performed               Past Medical History:  Diagnosis Date   Atopic dermatitis    Basal cell carcinoma    15 years ago.   Dysrhythmia    Hemochromatosis    Heterozygous for two genes   Palpitations    a. 04/2013 48h Holter: sinus rhythm/sinus arrhythmia, rare PAC's/PVC's.   PVC (premature ventricular contraction)    Shoulder pain, left 01/24/2015   Shoulder pain, right 01/24/2015   Past Surgical History:  Procedure Laterality Date   BREAST BIOPSY Right 09/25/2021   BREAST LUMPECTOMY WITH RADIOACTIVE SEED AND SENTINEL LYMPH NODE BIOPSY Right 10/28/2021   Procedure: RIGHT BREAST LUMPECTOMY WITH RADIOACTIVE SEED AND AXILLARY SENTINEL LYMPH NODE BIOPSY;  Surgeon: Rolm Bookbinder, MD;  Location: [place];  Service: General;  Laterality: Right;   COLONOSCOPY WITH PROPOFOL N/A 01/24/2021   Procedure: COLONOSCOPY WITH PROPOFOL;  Surgeon: [name], MD;  Location: [facility];  Service: Endoscopy;  Laterality: N/A;   Excision of basal cell carcinoma     PORT-A-CATH REMOVAL Left 03/13/2022   Procedure: REMOVAL PORT-A-CATH;  Surgeon: Rolm Bookbinder, MD;  Location: [place];  Service: General;  Laterality: Left;   PORTACATH PLACEMENT Left 11/19/2021   Procedure: INSERTION PORT-A-CATH;  Surgeon: Rolm Bookbinder, MD;  Location: [place];  Service: General;  Laterality: Left;   WISDOM TOOTH EXTRACTION     Patient Active Problem List   Diagnosis Date Noted    Genetic testing 10/24/2021   Malignant neoplasm of lower-inner quadrant of right breast of female, estrogen receptor positive ([place]) 10/01/2021   Colon cancer screening    Intractable menstrual migraine without status migrainosus 07/30/2020   Nonallopathic lesion of sacral region 12/30/2017   Trigger point of left shoulder region 10/06/2017   Slipped rib syndrome 08/05/2017   Nonallopathic lesion of rib cage 08/05/2017   Nonallopathic lesion of cervical region 08/05/2017   Nonallopathic lesion of thoracic region 08/05/2017   Nonallopathic lesion of lumbosacral region 08/05/2017   Chronic left shoulder pain 07/10/2017   Chronic LLQ pain 05/17/2017   Atopic dermatitis 07/10/2016   Hepatic cyst 07/10/2016   Routine physical examination 07/10/2016   PVC's (premature ventricular contractions) 04/22/2013   Hemochromatosis 04/22/2013    PCP: Mable Paris, FNP   REFERRING PROVIDER: Rolm Bookbinder, MD  REFERRING DIAG: C50.311 (ICD-10-CM) - Malignant neoplasm of lower-inner quadrant of right female breast Z17.0 (ICD-10-CM) - Estrogen receptor positive status (ER+)    THERAPY DIAG:  Stiffness of right shoulder, not elsewhere classified  Disorder of the skin and subcutaneous tissue related to radiation, unspecified  Lymphedema, not elsewhere classified  Aftercare following surgery for neoplasm  Abnormal posture  Malignant neoplasm of lower-inner quadrant of right breast of female, estrogen receptor positive ([place])  ONSET DATE: 01/28/23  Rationale for Evaluation and Treatment: Rehabilitation  SUBJECTIVE:  SUBJECTIVE STATEMENT: My ROM is coming back and now I only have the discomfort at the wrist.   PERTINENT HISTORY:  Patient was diagnosed on 09/25/2021 with right grade 3 invasive mammary  carcinoma of no particular type. She underwent a right lumpectomy and sentinel node biopsy on 09/28/2021. It is ER/PR positive and HER2 negative and the Ki67 was not measured.   PAIN:  PAIN:  Are you having pain? Yes, 2/10, R wrist, thumb web space,, dull, tender to touch and with extension, nothing improves it; just achy in the Rt axilla  PRECAUTIONS: Other: at risk of lymphedema  WEIGHT BEARING RESTRICTIONS: No  FALLS:  Has patient fallen in last 6 months? 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 at [employer]: 5- 6 days/ week 30-45 min on treadmill, walking  HAND DOMINANCE: right   PRIOR LEVEL OF FUNCTION: Independent  PATIENT GOALS: to increase ROM, decrease pain   OBJECTIVE:  COGNITION: Overall cognitive status: Within functional limits for tasks assessed   PALPATION: Cording palpable from axilla extending down towards forearm  OBSERVATIONS / OTHER ASSESSMENTS: numerous cords visible in upper arm when pt abducts UE  POSTURE: forward head and rounded shoulders  UPPER EXTREMITY AROM/PROM:  A/PROM RIGHT   eval   Shoulder extension 56  Shoulder flexion 136  Shoulder abduction 107  Shoulder internal rotation 54  Shoulder external rotation 83    (Blank rows = not tested)  A/PROM LEFT   eval  Shoulder extension 55  Shoulder flexion 166  Shoulder abduction 165  Shoulder internal rotation 61  Shoulder external rotation 91    (Blank rows = not tested)   LYMPHEDEMA ASSESSMENTS:  SURGERY TYPE/DATE: R lumpectomy and SLNB 10/28/21 NUMBER OF LYMPH NODES REMOVED: 5 CHEMOTHERAPY: completed 01/29/22 RADIATION: completed 03/18/22 HORMONE TREATMENT: currently on tamoxifen INFECTIONS: none  LYMPHEDEMA ASSESSMENTS:   LANDMARK RIGHT  eval  10 cm proximal to olecranon  process 26  Olecranon process 24.4  10 cm proximal to ulnar styloid process 19.2  Just proximal to ulnar styloid process 15.6  Across hand at thumb web space 18.5  At base of 2nd digit 5.9  (Blank rows = not tested)  LANDMARK LEFT  eval  10 cm proximal to olecranon process 25.8  Olecranon process 24  10 cm proximal to ulnar styloid process 19  Just proximal to ulnar styloid process 15.3  Across hand at thumb web space 18.4  At base of 2nd digit 5.5  (Blank rows = not tested)   TODAY'S TREATMENT:                                                                                                                                         DATE:  04/28/22: Therapeutic Exercises Pulleys into abd and flexion x2 mins  Roll yellow ball up wall into flexion x 10 and then Rt abd x 10 reps  Manual Therapy MLD: In supine: Short neck, 5 diaphragmatic breaths, Lt axillary nodes and establishment of anterior inter-axillary pathway, Rt inguinal nodes and establishment of Rt axillo-inguinal pathway, then Rt UE working proximal to distal, moving inner upper arm outwards and upwards, and doing both sides of forearms retracing all steps MFR to cording throughout R UE with focus at anterior forearm where there is tightness palpable but unable to palpate an individual cord STM to lumpectomy incision and SLNB incision where pt reports tightness felt at scar tissue Measured pt for a longer compression sleeve and she fit well in to a Sigvaris Secure size S2 sleeve and sigvaris secure small glove  04/24/22: Therapeutic Exercises Pulleys into abd (reports not feeling a stretch with flexion) x2 mins  Roll yellow ball up wall into flexion x 10 and then Rt abd x 5 reps with VC's to decrease Rt scapular compensation  Bil UE 3 way raises with 2# for flexion, scaption and abd x10 each standing with core engaged for back, head and shoulders against wall Bicep curls 2# x 10 Tricep extension 2# x 10 Supine over half foam  roll for  bil UE abd "snow angel" x5 with 10 sec holds (tried scaption but no stretch felt) Lt S/L for Rt UE open book stretch (horz abd) with wrist extension 5x 5 sec holds  Manual Therapy MLD: In supine: Short neck, 5 diaphragmatic breaths, Lt axillary nodes and establishment of anterior inter-axillary pathway, Rt inguinal nodes and establishment of Rt axillo-inguinal pathway, then Rt UE working proximal to distal, moving inner upper arm outwards and upwards, and doing both sides of forearms retracing all steps; also incorporated inferior breast tissue today and instructed pt in same as she is beginning to develop some mild peau d'orange with palpable edema at medial/inferior breast, also performed MLD along lateral anastomosis in Lt S/L and instructed pt in same as well MFR to cording throughout R UE with focus at wrist where there is discomfort though unable to palpate a cord here today; also trunk stretch with Lt trunk rotation and with Rt UE in abd with MFR at axilla and inferior breast, instructed pt how she can replicate this at home holding under her headboard while lying supine and using contralateral hand to apply skin stretch inferior to breast as she reports feeling a good stretch in this position  STM to lumpectomy incision and SLNB incision where pt reports tightness felt at scar tissue  04/21/22: Therapeutic Exercises Pulleys into flexion and abd x2 mins  Bil UE 3 way raises with 2# for flexion, scaption and abd x10 each Bicep curls 2# x 10 Tricep extension 2# x 10 Manual Therapy MLD: In supine: Short neck, 5 diaphragmatic breaths, Lt axillary nodes and establishment of anterior inter-axillary pathway, Rt inguinal nodes and establishment of Rt axillo-inguinal pathway, then Rt UE working proximal to distal, moving inner upper arm outwards and upwards, and doing both sides of forearms retracing all steps and reviewing with pt throughout MFR to cording throughout R UE with focus at wrist  where there is discomfort and a palpable cord   04/18/22: Therapeutic Exercises Pulleys into flexion and abd x2 mins  Ball roll up wall into flexion and Rt abd with 1# on Rt wrist x10 each Bil UE 3 way raises with 2# for flexion, and 1# for  scaption and abd x10 each Bicep curls 2# x 10 Yellow row x 10 Manual Therapy MLD: In supine: Short neck, 5 diaphragmatic breaths, Lt axillary nodes and establishment of anterior inter-axillary pathway, Rt inguinal nodes and establishment of Rt axillo-inguinal pathway, then Rt UE working proximal to distal, moving inner upper arm outwards and upwards, and doing both sides of forearms retracing all steps and reviewing with pt throughout MFR to cording throughout R UE     PATIENT EDUCATION:  Education details: Bil UE 3 way raises Person educated: Patient Education method: Explanation, Demonstration, handout issued Education comprehension: verbalized understanding, returned demonstration, pt will benefit from further review  HOME EXERCISE PROGRAM: Post op breast exercises Stretching in to cording Bil UE 3 way raises starting with 1#  ASSESSMENT: CLINICAL IMPRESSION: Pt still presents with visible fullness at wrist and forearm. Her medi harmony sleeve is too short so measured pt today for a longer sleeve with better containment since she still has fullness at wrist and forearm. If her SOZO is normal I would recommend she just order a new sleeve that is longer (Sigvaris Secure S2) but if her SOZO is still elevated I would also recommend that she order the Sigvaris secure size small glove to wear when she has more swelling or when she is flying. Will reassess SOZO at next session.   OBJECTIVE IMPAIRMENTS: decreased knowledge of use of DME, decreased ROM, increased edema, increased fascial restrictions, impaired UE functional use, postural dysfunction, and pain.   ACTIVITY LIMITATIONS: carrying, lifting, and reach over head  PARTICIPATION LIMITATIONS:   none  PERSONAL FACTORS:  none  are also affecting patient's functional outcome.   REHAB POTENTIAL: Good  CLINICAL DECISION MAKING: Stable/uncomplicated  EVALUATION COMPLEXITY: Low  GOALS: Goals reviewed with patient? Yes  SHORT TERM GOALS=LONG TERM GOALS Target date: 05/01/22  Pt will be independent in self MLD for long term management of edema.  Baseline: Goal status: INITIAL  2.  Pt will demonstrate 165 degrees of R shoulder flexion to allow her to reach overhead. Baseline:  Goal status: INITIAL  3.  Pt will demonstrate 165 degrees of R shoulder abduction to allow her to reach out to the side.  Baseline:  Goal status: INITIAL  4.  Pt will return to the green on SOZO to decrease risk of long term lymphedema.  Baseline:  Goal status: INITIAL  5.  Pt will be independent in a home exercise program for continued stretching and strengthening.  Baseline:  Goal status: INITIAL   PLAN:  PT FREQUENCY: 2x/week  PT DURATION: 4 weeks  PLANNED INTERVENTIONS: Therapeutic exercises, Therapeutic activity, Patient/Family education, Self Care, Joint mobilization, Orthotic/Fit training, Manual lymph drainage, Compression bandaging, scar mobilization, Vasopneumatic device, Manual therapy, and Re-evaluation  PLAN FOR NEXT SESSION: repeat SOZO around 05/01/22, PROM to R shoulder, pulleys, ball, MFR to cording; how are new HEP ? See note on 3/18 for compression garment sizing and ready assessment from 3/18 to determine if she needs sleeve or sleeve and glove   [employer], PT 04/28/2022, 5:02 PM  3 Way Raises:      Starting Position:  Leaning against wall, walk feet a few inches away from the wall and make tummy tight (tuck hips underneath you) Press back/shoulders/head against wall as much as possible. Keep thumbs up to ceiling, elbows straight and shoulders relaxed/down throughout.  1. Lift arms in front to shoulder height 2. Lift arms a little wider into a "V" to  shoulder height 3. Lift arms  out to sides in a "T" to shoulder height  Perform 10 times in each direction. Hold 1-2 lbs to start with and work up to 2-3 sets of 10/day. Perform 3-4 times/week. Increase weight as able, decreasing sets of 10 each time you increase weights, then slowly working your way back up to 2-3 sets each time.    Cancer Rehab [phone]

## 2022-05-01 ENCOUNTER — Ambulatory Visit: Payer: 59

## 2022-05-01 DIAGNOSIS — Z483 Aftercare following surgery for neoplasm: Secondary | ICD-10-CM | POA: Diagnosis not present

## 2022-05-01 DIAGNOSIS — R293 Abnormal posture: Secondary | ICD-10-CM | POA: Diagnosis not present

## 2022-05-01 DIAGNOSIS — C50311 Malignant neoplasm of lower-inner quadrant of right female breast: Secondary | ICD-10-CM

## 2022-05-01 DIAGNOSIS — L599 Disorder of the skin and subcutaneous tissue related to radiation, unspecified: Secondary | ICD-10-CM | POA: Diagnosis not present

## 2022-05-01 DIAGNOSIS — M25611 Stiffness of right shoulder, not elsewhere classified: Secondary | ICD-10-CM | POA: Diagnosis not present

## 2022-05-01 DIAGNOSIS — I890 Lymphedema, not elsewhere classified: Secondary | ICD-10-CM | POA: Diagnosis not present

## 2022-05-01 DIAGNOSIS — Z170 Estrogen receptor positive status [ER+]: Secondary | ICD-10-CM | POA: Diagnosis not present

## 2022-05-01 NOTE — Therapy (Addendum)
 OUTPATIENT PHYSICAL THERAPY ONCOLOGY TREATMENT  Patient Name: Adrienne Harris MRN: [id number] DOB:May 27, 1975, 47 y.o., female Today's Date: 05/01/2022  END OF SESSION:  PT End of Session - 05/01/22 0810     Visit Number 9    Number of Visits 9    Date for PT Re-Evaluation 05/01/22    PT Start Time 0802    PT Stop Time 0904    PT Time Calculation (min) 62 min    Activity Tolerance Patient tolerated treatment well    Behavior During Therapy [facility] for tasks assessed/performed               Past Medical History:  Diagnosis Date   Atopic dermatitis    Basal cell carcinoma    15 years ago.   Dysrhythmia    Hemochromatosis    Heterozygous for two genes   Palpitations    a. 04/2013 48h Holter: sinus rhythm/sinus arrhythmia, rare PAC's/PVC's.   PVC (premature ventricular contraction)    Shoulder pain, left 01/24/2015   Shoulder pain, right 01/24/2015   Past Surgical History:  Procedure Laterality Date   BREAST BIOPSY Right 09/25/2021   BREAST LUMPECTOMY WITH RADIOACTIVE SEED AND SENTINEL LYMPH NODE BIOPSY Right 10/28/2021   Procedure: RIGHT BREAST LUMPECTOMY WITH RADIOACTIVE SEED AND AXILLARY SENTINEL LYMPH NODE BIOPSY;  Surgeon: Ebbie Cough, MD;  Location: [facility];  Service: General;  Laterality: Right;   COLONOSCOPY WITH PROPOFOL  N/A 01/24/2021   Procedure: COLONOSCOPY WITH PROPOFOL ;  Surgeon: Jinny Carmine, MD;  Location: [facility];  Service: Endoscopy;  Laterality: N/A;   Excision of basal cell carcinoma     PORT-A-CATH REMOVAL Left 03/13/2022   Procedure: REMOVAL PORT-A-CATH;  Surgeon: Ebbie Cough, MD;  Location: [facility];  Service: General;  Laterality: Left;   PORTACATH PLACEMENT Left 11/19/2021   Procedure: INSERTION PORT-A-CATH;  Surgeon: Ebbie Cough, MD;  Location: [facility];  Service: General;  Laterality: Left;   WISDOM TOOTH EXTRACTION     Patient Active Problem List   Diagnosis Date Noted    Genetic testing 10/24/2021   Malignant neoplasm of lower-inner quadrant of right breast of female, estrogen receptor positive (HCC) 10/01/2021   Colon cancer screening    Intractable menstrual migraine without status migrainosus 07/30/2020   Nonallopathic lesion of sacral region 12/30/2017   Trigger point of left shoulder region 10/06/2017   Slipped rib syndrome 08/05/2017   Nonallopathic lesion of rib cage 08/05/2017   Nonallopathic lesion of cervical region 08/05/2017   Nonallopathic lesion of thoracic region 08/05/2017   Nonallopathic lesion of lumbosacral region 08/05/2017   Chronic left shoulder pain 07/10/2017   Chronic LLQ pain 05/17/2017   Atopic dermatitis 07/10/2016   Hepatic cyst 07/10/2016   Routine physical examination 07/10/2016   PVC's (premature ventricular contractions) 04/22/2013   Hemochromatosis 04/22/2013    PCP: Rollene Northern, FNP   REFERRING PROVIDER: Cough Ebbie, MD  REFERRING DIAG: C50.311 (ICD-10-CM) - Malignant neoplasm of lower-inner quadrant of right female breast Z17.0 (ICD-10-CM) - Estrogen receptor positive status (ER+)    THERAPY DIAG:  Stiffness of right shoulder, not elsewhere classified  Disorder of the skin and subcutaneous tissue related to radiation, unspecified  Lymphedema, not elsewhere classified  Aftercare following surgery for neoplasm  Abnormal posture  Malignant neoplasm of lower-inner quadrant of right breast of female, estrogen receptor positive (HCC)  ONSET DATE: 01/28/23  Rationale for Evaluation and Treatment: Rehabilitation  SUBJECTIVE:  SUBJECTIVE STATEMENT: My Rt wrist has been a little better as far as the soreness goes. I've been working on stretching it. It feels tight. I've been wearing my hand compression when I  remember. I take it off to go to the bathroom and then I'll forget to put it back on.   PERTINENT HISTORY:  Patient was diagnosed on 09/25/2021 with right grade 3 invasive mammary carcinoma of no particular type. She underwent a right lumpectomy and sentinel node biopsy on 09/28/2021. It is ER/PR positive and HER2 negative and the Ki67 was not measured.   PAIN:  PAIN:  Are you having pain? Yes, 2/10, R wrist, thumb web space,, dull, tender to touch and with extension, nothing improves it; just achy in the Rt axilla  PRECAUTIONS: Other: at risk of lymphedema  WEIGHT BEARING RESTRICTIONS: No  FALLS:  Has patient fallen in last 6 months? 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 works at [employer]: 5- 6 days/ week 30-45 min on treadmill, walking  HAND DOMINANCE: right   PRIOR LEVEL OF FUNCTION: Independent  PATIENT GOALS: to increase ROM, decrease pain   OBJECTIVE:  COGNITION: Overall cognitive status: Within functional limits for tasks assessed   PALPATION: Cording palpable from axilla extending down towards forearm  OBSERVATIONS / OTHER ASSESSMENTS: numerous cords visible in upper arm when pt abducts UE  POSTURE: forward head and rounded shoulders  UPPER EXTREMITY AROM/PROM:  A/PROM RIGHT   eval  05/01/22  Shoulder extension 56 62  Shoulder flexion 136 165  Shoulder abduction 107 172  Shoulder internal rotation 54 62  Shoulder external rotation 83 90    (Blank rows = not tested)  A/PROM LEFT   eval  Shoulder extension 55  Shoulder flexion 166  Shoulder abduction 165  Shoulder internal rotation 61  Shoulder external rotation 91    (Blank rows = not tested)   LYMPHEDEMA ASSESSMENTS:  SURGERY TYPE/DATE: R lumpectomy and SLNB 10/28/21 NUMBER OF LYMPH NODES REMOVED:  5 CHEMOTHERAPY: completed 01/29/22 RADIATION: completed 03/18/22 HORMONE TREATMENT: currently on tamoxifen  INFECTIONS: none  LYMPHEDEMA ASSESSMENTS:   LANDMARK RIGHT  eval  10 cm proximal to olecranon process 26  Olecranon process 24.4  10 cm proximal to ulnar styloid process 19.2  Just proximal to ulnar styloid process 15.6  Across hand at thumb web space 18.5  At base of 2nd digit 5.9  (Blank rows = not tested)  LANDMARK LEFT  eval  10 cm proximal to olecranon process 25.8  Olecranon process 24  10 cm proximal to ulnar styloid process 19  Just proximal to ulnar styloid process 15.3  Across hand at thumb web space 18.4  At base of 2nd digit 5.5  (Blank rows = not tested)   L-DEX FLOWSHEETS - 05/01/22 0800       L-DEX LYMPHEDEMA SCREENING   Measurement Type Unilateral    L-DEX MEASUREMENT EXTREMITY Upper Extremity    POSITION  Standing    DOMINANT SIDE Right    At Risk Side Right    BASELINE SCORE (UNILATERAL) -6.4    L-DEX SCORE (UNILATERAL) 3.7    VALUE CHANGE (UNILAT) 10.1             TODAY'S TREATMENT:  DATE:  05/01/22: Therapeutic Exercises Pulleys into abd x2 mins  Roll yellow ball up wall into flexion x 10 and then Rt abd x 10 reps  FreeMotion machine: 7# bil scap retrac x 10 and then 3# bil UE ext with core engaged and returning therapist demo Wall Push Ups x10 with VC's to keep elbows as close to side as possible SOZO done, see above for score Manual Therapy MLD: In supine: Short neck, 5 diaphragmatic breaths, Lt axillary nodes and establishment of anterior inter-axillary pathway, Rt inguinal nodes and establishment of Rt axillo-inguinal pathway, then Rt UE working proximal to distal, upper arm lateral, inner to outer and then lateral upper arm again, antecubital fossa, doing both sides of forearms, and dorsum of hand  then retracing all steps MFR to Rt axilla to facilitate scar tissue release, no cording palpable today STM to lumpectomy incision and SLNB incision where pt reports tightness still felt at scar tissue  04/28/22: Therapeutic Exercises Pulleys into abd and flexion x2 mins  Roll yellow ball up wall into flexion x 10 and then Rt abd x 10 reps  Manual Therapy MLD: In supine: Short neck, 5 diaphragmatic breaths, Lt axillary nodes and establishment of anterior inter-axillary pathway, Rt inguinal nodes and establishment of Rt axillo-inguinal pathway, then Rt UE working proximal to distal, moving inner upper arm outwards and upwards, and doing both sides of forearms retracing all steps MFR to cording throughout R UE with focus at anterior forearm where there is tightness palpable but unable to palpate an individual cord STM to lumpectomy incision and SLNB incision where pt reports tightness felt at scar tissue Measured pt for a longer compression sleeve and she fit well in to a Sigvaris Secure size S2 sleeve and sigvaris secure small glove  04/24/22: Therapeutic Exercises Pulleys into abd (reports not feeling a stretch with flexion) x2 mins  Roll yellow ball up wall into flexion x 10 and then Rt abd x 5 reps with VC's to decrease Rt scapular compensation  Bil UE 3 way raises with 2# for flexion, scaption and abd x10 each standing with core engaged for back, head and shoulders against wall Bicep curls 2# x 10 Tricep extension 2# x 10 Supine over half foam roll for  bil UE abd snow angel x5 with 10 sec holds (tried scaption but no stretch felt) Lt S/L for Rt UE open book stretch (horz abd) with wrist extension 5x 5 sec holds  Manual Therapy MLD: In supine: Short neck, 5 diaphragmatic breaths, Lt axillary nodes and establishment of anterior inter-axillary pathway, Rt inguinal nodes and establishment of Rt axillo-inguinal pathway, then Rt UE working proximal to distal, moving inner upper arm outwards and  upwards, and doing both sides of forearms retracing all steps; also incorporated inferior breast tissue today and instructed pt in same as she is beginning to develop some mild peau d'orange with palpable edema at medial/inferior breast, also performed MLD along lateral anastomosis in Lt S/L and instructed pt in same as well MFR to cording throughout R UE with focus at wrist where there is discomfort though unable to palpate a cord here today; also trunk stretch with Lt trunk rotation and with Rt UE in abd with MFR at axilla and inferior breast, instructed pt how she can replicate this at home holding under her headboard while lying supine and using contralateral hand to apply skin stretch inferior to breast as she reports feeling a good stretch in this position  STM to lumpectomy incision  and SLNB incision where pt reports tightness felt at scar tissue     PATIENT EDUCATION:  Education details: Bil UE 3 way raises Person educated: Patient Education method: Explanation, Demonstration, handout issued Education comprehension: verbalized understanding, returned demonstration, pt will benefit from further review  HOME EXERCISE PROGRAM: Post op breast exercises Stretching in to cording Bil UE 3 way raises starting with 1#  ASSESSMENT: CLINICAL IMPRESSION: Pt has made excellent progress towards her goals meeting all except for her SOZO goal, though she did progress towards this. Her SOZO reduced from a change of baseline from 13.1 to 10.1. She was remeasured for a better fitting compression sleeve and glove and gauntlet at last session so will send order to [facility] as she was fully covered when we ordered from them last time.Sleeve fit well circumferentially but was too short so this will be a linger sleeve and slightly better containing as she did ont fully reduce within first 4 weeks of wear of compression. Pt is ready to stop treatment for today but will cont with SOZO screens. She is scheduled to  return in 1 more month for a SOZO screen after continued wear of compression. Pt is agreeable to this. Pt was measured for RTW compression garments last session and she will benefit from getting these due to continuing to have subclinical to Stage 1 lymphedema that still spontaneously reverses. The compression garments will prevent worsening of her current condition and she only requires 20-30 mmHg at this time as her swelling is mild. Pt will most benefit from a Class I: Sigvaris Secure S2 sleeve (x2 for one to wash and one to wear) and Sigvaris secure size small glove for when her fingers have increased edema (also x2 of these for one to wash and one to wear).  OBJECTIVE IMPAIRMENTS: decreased knowledge of use of DME, decreased ROM, increased edema, increased fascial restrictions, impaired UE functional use, postural dysfunction, and pain.   ACTIVITY LIMITATIONS: carrying, lifting, and reach over head  PARTICIPATION LIMITATIONS: none  PERSONAL FACTORS: none are also affecting patient's functional outcome.   REHAB POTENTIAL: Good  CLINICAL DECISION MAKING: Stable/uncomplicated  EVALUATION COMPLEXITY: Low  GOALS: Goals reviewed with patient? Yes  SHORT TERM GOALS=LONG TERM GOALS Target date: 05/01/22  Pt will be independent in self MLD for long term management of edema.  Baseline: 05/01/22 - Pt is independent in this Goal status: MET  2.  Pt will demonstrate 165 degrees of R shoulder flexion to allow her to reach overhead. Baseline: 05/01/22 - 165 degrees Goal status: MET  3.  Pt will demonstrate 165 degrees of R shoulder abduction to allow her to reach out to the side.  Baseline:  05/01/22 - 172 degrees Goal status: MET  4.  Pt will return to the green on SOZO to decrease risk of long term lymphedema.  Baseline:  05/01/22 - SOZO reduced to 10.1 which is 0.1 away from yellow  Goal status: PROGRESS TOWARDS GOAL  5.  Pt will be independent in a home exercise program for continued  stretching and strengthening.  Baseline: Pt is independent at this time and knows how to safely progress resistance with keeping her risk of progressing her lymphedema low Goal status: MET   PLAN:  PT FREQUENCY: 2x/week  PT DURATION: 4 weeks  PLANNED INTERVENTIONS: Therapeutic exercises, Therapeutic activity, Patient/Family education, Self Care, Joint mobilization, Orthotic/Fit training, Manual lymph drainage, Compression bandaging, scar mobilization, Vasopneumatic device, Manual therapy, and Re-evaluation  PLAN FOR NEXT SESSION: Pt has completed  active treatment at this time but is scheduled to return for another SOZO x1 month due to improved but still elevated change from baseline SOZO score. Depending on that score will D/C or may possibly need to resume treatment.    Aden Berwyn Caldron, PTA 05/01/2022, 10:30 AM  3 Way Raises:      Starting Position:  Leaning against wall, walk feet a few inches away from the wall and make tummy tight (tuck hips underneath you) Press back/shoulders/head against wall as much as possible. Keep thumbs up to ceiling, elbows straight and shoulders relaxed/down throughout.  1. Lift arms in front to shoulder height 2. Lift arms a little wider into a V to shoulder height 3. Lift arms out to sides in a T to shoulder height  Perform 10 times in each direction. Hold 1-2 lbs to start with and work up to 2-3 sets of 10/day. Perform 3-4 times/week. Increase weight as able, decreasing sets of 10 each time you increase weights, then slowly working your way back up to 2-3 sets each time.    Cancer Rehab [phone]  PHYSICAL THERAPY DISCHARGE SUMMARY  Visits from Start of Care: 9  Current functional level related to goals / functional outcomes: See above   Remaining deficits: See above   Education / Equipment: Self MLD, compression garments, HEP   Patient agrees to discharge. Patient goals were partially met. Patient is being discharged  due to not returning since the last visit.  Florina Sever [facility], [place] 10/08/23 4:48 PM

## 2022-05-02 ENCOUNTER — Telehealth: Payer: Self-pay | Admitting: *Deleted

## 2022-05-02 NOTE — Telephone Encounter (Signed)
Gave papers for pt to come back to work full time starting 3/27. Khamaya will send it to her manager.

## 2022-05-06 DIAGNOSIS — D2261 Melanocytic nevi of right upper limb, including shoulder: Secondary | ICD-10-CM | POA: Diagnosis not present

## 2022-05-06 DIAGNOSIS — D2272 Melanocytic nevi of left lower limb, including hip: Secondary | ICD-10-CM | POA: Diagnosis not present

## 2022-05-06 DIAGNOSIS — D225 Melanocytic nevi of trunk: Secondary | ICD-10-CM | POA: Diagnosis not present

## 2022-05-06 DIAGNOSIS — L821 Other seborrheic keratosis: Secondary | ICD-10-CM | POA: Diagnosis not present

## 2022-05-06 DIAGNOSIS — D2271 Melanocytic nevi of right lower limb, including hip: Secondary | ICD-10-CM | POA: Diagnosis not present

## 2022-05-06 DIAGNOSIS — D2262 Melanocytic nevi of left upper limb, including shoulder: Secondary | ICD-10-CM | POA: Diagnosis not present

## 2022-05-07 ENCOUNTER — Other Ambulatory Visit: Payer: Self-pay

## 2022-05-07 ENCOUNTER — Encounter: Payer: Self-pay | Admitting: Oncology

## 2022-05-15 ENCOUNTER — Ambulatory Visit: Payer: 59

## 2022-05-29 ENCOUNTER — Ambulatory Visit (INDEPENDENT_AMBULATORY_CARE_PROVIDER_SITE_OTHER): Payer: 59 | Admitting: Obstetrics and Gynecology

## 2022-05-29 ENCOUNTER — Encounter: Payer: Self-pay | Admitting: Obstetrics and Gynecology

## 2022-05-29 VITALS — BP 124/80 | Ht 66.0 in | Wt 134.0 lb

## 2022-05-29 DIAGNOSIS — Z30430 Encounter for insertion of intrauterine contraceptive device: Secondary | ICD-10-CM | POA: Diagnosis not present

## 2022-05-29 MED ORDER — PARAGARD INTRAUTERINE COPPER IU IUD
1.0000 | INTRAUTERINE_SYSTEM | Freq: Once | INTRAUTERINE | Status: AC
  Administered 2022-05-29: 1 via INTRAUTERINE

## 2022-05-29 NOTE — Progress Notes (Signed)
   Chief Complaint  Patient presents with   Contraception    Paragard insertion     IUD PROCEDURE NOTE:  Adrienne Harris is a 47 y.o. G1P1001 here for Paragard  IUD insertion for [facility]. Diagnosed with breast cancer last year and can't have hormones. S/p lumpectomy, chemo, radiation, is on tamoxifen currently. No period since did chemo. No BTB. Not sexually active since 11/23.    BP 124/80   Ht 5\' 6"  (1.676 m)   Wt 134 lb (60.8 kg)   BMI 21.63 kg/m   IUD Insertion Procedure Note Patient identified, informed consent performed, consent signed.   Discussed risks of irregular bleeding, cramping, infection, malpositioning or misplacement of the IUD outside the uterus which may require further procedure such as laparoscopy, risk of failure <1%. Time out was performed.    Speculum placed in the vagina.  Cervix visualized.  Cleaned with Betadine x 2.  Grasped anteriorly with a single tooth tenaculum.  Uterus sounded to 8.0 cm.   IUD placed per manufacturer's recommendations.  Strings trimmed to 3 cm. Tenaculum was removed, good hemostasis noted.  Patient tolerated procedure well.   ASSESSMENT:  Encounter for insertion of copper IUD - Plan: paragard intrauterine copper IUD 1 each   Meds ordered this encounter  Medications   paragard intrauterine copper IUD 1 each     Plan:  Patient was given post-procedure instructions.  She was advised to have backup contraception for one week.   Call if you are having increasing pain, cramps or bleeding or if you have a fever greater than 100.4 degrees F., shaking chills, nausea or vomiting. Patient was also asked to check IUD strings periodically and follow up in 4 weeks for IUD check.  Return in about 8 weeks (around 07/24/2022) for annual, IUD f/u.  Graison Leinberger B. Quanesha Klimaszewski, PA-C 05/29/2022 4:45 PM

## 2022-05-29 NOTE — Patient Instructions (Addendum)
I value your feedback and you entrusting us with your care. If you get a [facility] patient survey, I would appreciate you taking the time to let us know about your experience today. Thank you!  Instructions after IUD insertion  Most women experience no significant problems after insertion of an IUD, however minor cramping and spotting for a few days is common. Cramps may be treated with ibuprofen 800mg every 8 hours or Tylenol 650 mg every 4 hours. Contact [facility] immediately if you experience any of the following symptoms during the next week: temperature >99.6 degrees, worsening pelvic pain, abdominal pain, fainting, unusually heavy vaginal bleeding, foul vaginal discharge, or if you think you have expelled the IUD. Nothing inserted in the vagina for 48 hours. You will be scheduled for a follow up visit in approximately four weeks.    

## 2022-06-02 ENCOUNTER — Encounter: Payer: Self-pay | Admitting: Obstetrics and Gynecology

## 2022-06-02 ENCOUNTER — Ambulatory Visit: Payer: 59 | Attending: General Surgery

## 2022-06-02 VITALS — Wt 130.0 lb

## 2022-06-02 DIAGNOSIS — Z483 Aftercare following surgery for neoplasm: Secondary | ICD-10-CM

## 2022-06-02 NOTE — Therapy (Signed)
OUTPATIENT PHYSICAL THERAPY SOZO SCREENING NOTE   Patient Name: Adrienne Harris MRN: [id number] DOB:1975-10-17, 47 y.o., female Today's Date: 06/02/2022  PCP: Allegra Grana, FNP REFERRING PROVIDER: Emelia Loron, MD   PT End of Session - 06/02/22 1658     Visit Number 9   # unchanged due to screen only   PT Start Time 1657    PT Stop Time 1701    PT Time Calculation (min) 4 min    Activity Tolerance Patient tolerated treatment well    Behavior During Therapy WFL for tasks assessed/performed             Past Medical History:  Diagnosis Date   Atopic dermatitis    Basal cell carcinoma    15 years ago.   Dysrhythmia    Hemochromatosis    Heterozygous for two genes   Palpitations    a. 04/2013 48h Holter: sinus rhythm/sinus arrhythmia, rare PAC's/PVC's.   PVC (premature ventricular contraction)    Shoulder pain, left 01/24/2015   Shoulder pain, right 01/24/2015   Past Surgical History:  Procedure Laterality Date   BREAST BIOPSY Right 09/25/2021   BREAST LUMPECTOMY WITH RADIOACTIVE SEED AND SENTINEL LYMPH NODE BIOPSY Right 10/28/2021   Procedure: RIGHT BREAST LUMPECTOMY WITH RADIOACTIVE SEED AND AXILLARY SENTINEL LYMPH NODE BIOPSY;  Surgeon: Emelia Loron, MD;  Location: [facility];  Service: General;  Laterality: Right;   COLONOSCOPY WITH PROPOFOL N/A 01/24/2021   Procedure: COLONOSCOPY WITH PROPOFOL;  Surgeon: Midge Minium, MD;  Location: [facility];  Service: Endoscopy;  Laterality: N/A;   Excision of basal cell carcinoma     PORT-A-CATH REMOVAL Left 03/13/2022   Procedure: REMOVAL PORT-A-CATH;  Surgeon: Emelia Loron, MD;  Location: [facility];  Service: General;  Laterality: Left;   PORTACATH PLACEMENT Left 11/19/2021   Procedure: INSERTION PORT-A-CATH;  Surgeon: Emelia Loron, MD;  Location: [facility];  Service: General;  Laterality: Left;   WISDOM TOOTH EXTRACTION     Patient Active Problem List    Diagnosis Date Noted   Genetic testing 10/24/2021   Malignant neoplasm of lower-inner quadrant of right breast of female, estrogen receptor positive 10/01/2021   Colon cancer screening    Intractable menstrual migraine without status migrainosus 07/30/2020   Nonallopathic lesion of sacral region 12/30/2017   Trigger point of left shoulder region 10/06/2017   Slipped rib syndrome 08/05/2017   Nonallopathic lesion of rib cage 08/05/2017   Nonallopathic lesion of cervical region 08/05/2017   Nonallopathic lesion of thoracic region 08/05/2017   Nonallopathic lesion of lumbosacral region 08/05/2017   Chronic left shoulder pain 07/10/2017   Chronic LLQ pain 05/17/2017   Atopic dermatitis 07/10/2016   Hepatic cyst 07/10/2016   Routine physical examination 07/10/2016   PVC's (premature ventricular contractions) 04/22/2013   Hemochromatosis 04/22/2013    REFERRING DIAG: right breast cancer at risk for lymphedema  THERAPY DIAG:  Aftercare following surgery for neoplasm  PERTINENT HISTORY: Patient was diagnosed on 09/25/2021 with right grade 3 invasive mammary carcinoma of no particular type. She underwent a right lumpectomy and sentinel node biopsy on 09/28/2021. It is ER/PR positive and HER2 negative and the Ki67 was not measured.    PRECAUTIONS: right UE Lymphedema risk, None  SUBJECTIVE: Pt returns for her 3 month L-Dex screen .  PAIN:  Are you having pain? No  SOZO SCREENING: Patient was assessed today using the SOZO machine to determine the lymphedema index score. This was compared to her baseline score.  It was determined that she is within the recommended range when compared to her baseline and no further action is needed at this time. She will continue SOZO screenings. These are done every 3 months for 2 years post operatively followed by every 6 months for 2 years, and then annually.   L-DEX FLOWSHEETS - 06/02/22 1600       L-DEX LYMPHEDEMA SCREENING   Measurement Type  Unilateral    L-DEX MEASUREMENT EXTREMITY Upper Extremity    POSITION  Standing    DOMINANT SIDE Right    At Risk Side Right    BASELINE SCORE (UNILATERAL) -6.4    L-DEX SCORE (UNILATERAL) -8    VALUE CHANGE (UNILAT) -1.6               Hermenia Bers, PTA 06/02/2022, 5:00 PM

## 2022-06-04 DIAGNOSIS — C50311 Malignant neoplasm of lower-inner quadrant of right female breast: Secondary | ICD-10-CM | POA: Diagnosis not present

## 2022-06-04 DIAGNOSIS — Z170 Estrogen receptor positive status [ER+]: Secondary | ICD-10-CM | POA: Diagnosis not present

## 2022-06-13 ENCOUNTER — Encounter: Payer: Self-pay | Admitting: Oncology

## 2022-06-13 ENCOUNTER — Other Ambulatory Visit: Payer: Self-pay | Admitting: Oncology

## 2022-06-13 ENCOUNTER — Inpatient Hospital Stay (HOSPITAL_BASED_OUTPATIENT_CLINIC_OR_DEPARTMENT_OTHER): Payer: 59 | Admitting: Oncology

## 2022-06-13 ENCOUNTER — Other Ambulatory Visit: Payer: Self-pay | Admitting: *Deleted

## 2022-06-13 ENCOUNTER — Inpatient Hospital Stay: Payer: 59 | Attending: Oncology

## 2022-06-13 ENCOUNTER — Other Ambulatory Visit: Payer: Self-pay

## 2022-06-13 VITALS — BP 130/85 | HR 63 | Temp 96.9°F | Resp 18 | Ht 66.0 in | Wt 131.4 lb

## 2022-06-13 DIAGNOSIS — Z170 Estrogen receptor positive status [ER+]: Secondary | ICD-10-CM | POA: Diagnosis not present

## 2022-06-13 DIAGNOSIS — Z803 Family history of malignant neoplasm of breast: Secondary | ICD-10-CM | POA: Insufficient documentation

## 2022-06-13 DIAGNOSIS — Z975 Presence of (intrauterine) contraceptive device: Secondary | ICD-10-CM | POA: Insufficient documentation

## 2022-06-13 DIAGNOSIS — C50311 Malignant neoplasm of lower-inner quadrant of right female breast: Secondary | ICD-10-CM

## 2022-06-13 DIAGNOSIS — Z79810 Long term (current) use of selective estrogen receptor modulators (SERMs): Secondary | ICD-10-CM | POA: Diagnosis not present

## 2022-06-13 DIAGNOSIS — M8580 Other specified disorders of bone density and structure, unspecified site: Secondary | ICD-10-CM | POA: Diagnosis not present

## 2022-06-13 DIAGNOSIS — Z923 Personal history of irradiation: Secondary | ICD-10-CM | POA: Insufficient documentation

## 2022-06-13 DIAGNOSIS — Z8051 Family history of malignant neoplasm of kidney: Secondary | ICD-10-CM | POA: Insufficient documentation

## 2022-06-13 DIAGNOSIS — Z79899 Other long term (current) drug therapy: Secondary | ICD-10-CM | POA: Insufficient documentation

## 2022-06-13 DIAGNOSIS — Z5181 Encounter for therapeutic drug level monitoring: Secondary | ICD-10-CM | POA: Diagnosis not present

## 2022-06-13 DIAGNOSIS — Z9221 Personal history of antineoplastic chemotherapy: Secondary | ICD-10-CM | POA: Diagnosis not present

## 2022-06-13 LAB — COMPREHENSIVE METABOLIC PANEL
ALT: 12 U/L (ref 0–44)
AST: 16 U/L (ref 15–41)
Albumin: 3.7 g/dL (ref 3.5–5.0)
Alkaline Phosphatase: 43 U/L (ref 38–126)
Anion gap: 5 (ref 5–15)
BUN: 17 mg/dL (ref 6–20)
CO2: 30 mmol/L (ref 22–32)
Calcium: 9.3 mg/dL (ref 8.9–10.3)
Chloride: 106 mmol/L (ref 98–111)
Creatinine, Ser: 0.7 mg/dL (ref 0.44–1.00)
GFR, Estimated: >60 mL/min (ref >60)
Glucose, Bld: 91 mg/dL (ref 70–99)
Potassium: 3.8 mmol/L (ref 3.5–5.1)
Sodium: 141 mmol/L (ref 135–145)
Total Bilirubin: 0.4 mg/dL (ref 0.3–1.2)
Total Protein: 6.5 g/dL (ref 6.5–8.1)

## 2022-06-13 LAB — CBC WITH DIFFERENTIAL/PLATELET
Abs Immature Granulocytes: 0.01 10*3/uL (ref 0.00–0.07)
Basophils Absolute: 0 10*3/uL (ref 0.0–0.1)
Basophils Relative: 1 %
Eosinophils Absolute: 0.1 10*3/uL (ref 0.0–0.5)
Eosinophils Relative: 3 %
HCT: 39.1 % (ref 36.0–46.0)
Hemoglobin: 13.1 g/dL (ref 12.0–15.0)
Immature Granulocytes: 0 %
Lymphocytes Relative: 41 %
Lymphs Abs: 1.6 10*3/uL (ref 0.7–4.0)
MCH: 32.3 pg (ref 26.0–34.0)
MCHC: 33.5 g/dL (ref 30.0–36.0)
MCV: 96.3 fL (ref 80.0–100.0)
Monocytes Absolute: 0.4 10*3/uL (ref 0.1–1.0)
Monocytes Relative: 10 %
Neutro Abs: 1.7 10*3/uL (ref 1.7–7.7)
Neutrophils Relative %: 45 %
Platelets: 195 10*3/uL (ref 150–400)
RBC: 4.06 MIL/uL (ref 3.87–5.11)
RDW: 12.3 % (ref 11.5–15.5)
WBC: 3.8 10*3/uL — ABNORMAL LOW (ref 4.0–10.5)
nRBC: 0 % (ref 0.0–0.2)

## 2022-06-13 LAB — VITAMIN D 25 HYDROXY (VIT D DEFICIENCY, FRACTURES): Vit D, 25-Hydroxy: 45.8 ng/mL (ref 30–100)

## 2022-06-13 MED ORDER — TAMOXIFEN CITRATE 20 MG PO TABS
20.0000 mg | ORAL_TABLET | Freq: Every day | ORAL | 3 refills | Status: DC
  Filled 2022-06-13: qty 90, 90d supply, fill #0
  Filled 2022-09-07: qty 90, 90d supply, fill #1
  Filled 2022-12-09: qty 90, 90d supply, fill #2
  Filled 2023-03-08: qty 90, 90d supply, fill #3

## 2022-06-13 NOTE — Progress Notes (Signed)
Hematology/Oncology Consult note [facility]  Telephone:([phone] [phone]  Patient Care Team: Allegra Grana, FNP as PCP - General (Family Medicine) Hulen Luster, RN as Oncology Nurse Navigator Creig Hines, MD as Consulting Physician (Oncology) Emelia Loron, MD as Consulting Physician (General Surgery) Lonie Peak, MD as Attending Physician (Radiation Oncology)   Name of the patient: Adrienne Harris  [id number]  [birth year]   Date of visit: 06/13/22  Diagnosis- pathological prognostic stage Ia invasive mammary carcinoma of the right breast ER/PR positive HER2 negative     Chief complaint/ Reason for visit-routine follow-up of breast cancer on tamoxifen  Heme/Onc history: patient is a 47 year old female who recently underwent aLateral screening mammogram which showed a possible area of concern in the right breast.  This was followed by diagnostic mammogram and ultrasound which showed a 7 mm mass in the right lower breast.  No suspicious right axillary adenopathy.  Biopsy confirmed invasive mammary carcinoma 6 mm grade 3 ER 51 to 90% positive PR 51 to 90% positive and HER2 negative.     Oncotype score came back at 23 with an absolute chemotherapy benefit of about 6.5%.  Adjuvant TC chemotherapy was recommended.  4 cycles of chemotherapy were completed on 01/29/2022.  Patient is currently undergoing adjuvant radiation therapy.  Interval history-she is doing well on tamoxifen and reports no significant side effects and occasional dizziness especially when she changes her position.  She has had a few self-limited episodes of knee pain.  She has a copper IUD in place  ECOG PS- 0 Pain scale- 0   Review of systems- Review of Systems  Constitutional:  Negative for chills, fever, malaise/fatigue and weight loss.  HENT:  Negative for congestion, ear discharge and nosebleeds.   Eyes:  Negative for blurred vision.  Respiratory:  Negative for  cough, hemoptysis, sputum production, shortness of breath and wheezing.   Cardiovascular:  Negative for chest pain, palpitations, orthopnea and claudication.  Gastrointestinal:  Negative for abdominal pain, blood in stool, constipation, diarrhea, heartburn, melena, nausea and vomiting.  Genitourinary:  Negative for dysuria, flank pain, frequency, hematuria and urgency.  Musculoskeletal:  Negative for back pain, joint pain and myalgias.  Skin:  Negative for rash.  Neurological:  Negative for dizziness, tingling, focal weakness, seizures, weakness and headaches.  Endo/Heme/Allergies:  Does not bruise/bleed easily.  Psychiatric/Behavioral:  Negative for depression and suicidal ideas. The patient does not have insomnia.       Allergies  Allergen Reactions   Propylene Glycol     Skin rash   Hydrocortisone Rash    Allergic to all topical steriods Allergic to all topical corticosteriods   Other Rash    Topical steroid      Past Medical History:  Diagnosis Date   Atopic dermatitis    Basal cell carcinoma    15 years ago.   Dysrhythmia    Hemochromatosis    Heterozygous for two genes   Palpitations    a. 04/2013 48h Holter: sinus rhythm/sinus arrhythmia, rare PAC's/PVC's.   PVC (premature ventricular contraction)    Shoulder pain, left 01/24/2015   Shoulder pain, right 01/24/2015     Past Surgical History:  Procedure Laterality Date   BREAST BIOPSY Right 09/25/2021   BREAST LUMPECTOMY WITH RADIOACTIVE SEED AND SENTINEL LYMPH NODE BIOPSY Right 10/28/2021   Procedure: RIGHT BREAST LUMPECTOMY WITH RADIOACTIVE SEED AND AXILLARY SENTINEL LYMPH NODE BIOPSY;  Surgeon: Emelia Loron, MD;  Location: [facility];  Service:  General;  Laterality: Right;   COLONOSCOPY WITH PROPOFOL N/A 01/24/2021   Procedure: COLONOSCOPY WITH PROPOFOL;  Surgeon: Midge Minium, MD;  Location: [facility] ENDOSCOPY;  Service: Endoscopy;  Laterality: N/A;   Excision of basal cell carcinoma      PORT-A-CATH REMOVAL Left 03/13/2022   Procedure: REMOVAL PORT-A-CATH;  Surgeon: Emelia Loron, MD;  Location: [facility];  Service: General;  Laterality: Left;   PORTACATH PLACEMENT Left 11/19/2021   Procedure: INSERTION PORT-A-CATH;  Surgeon: Emelia Loron, MD;  Location:  SURGERY CENTER;  Service: General;  Laterality: Left;   WISDOM TOOTH EXTRACTION      Social History   Socioeconomic History   Marital status: Married    Spouse name: Not on file   Number of children: 1   Years of education: Not on file   Highest education level: Not on file  Occupational History   Occupation: Dietitian  Tobacco Use   Smoking status: Never   Smokeless tobacco: Never  Vaping Use   Vaping Use: Never used  Substance and Sexual Activity   Alcohol use: Not Currently    Alcohol/week: 2.0 standard drinks of alcohol    Types: 2 Cans of beer per week    Comment: 2-4 beer/wine aweek.   Drug use: No   Sexual activity: Not Currently    Partners: Male    Birth control/protection: None  Other Topics Concern   Not on file  Social History Narrative   Married      Daughter -16 yrs.       Dietitian for [employer]   Social Determinants of Health   Financial Resource Strain: Not on file  Food Insecurity: Not on file  Transportation Needs: Not on file  Physical Activity: Inactive (05/15/2017)   Exercise Vital Sign    Days of Exercise per Week: 0 days    Minutes of Exercise per Session: 0 min  Stress: No Stress Concern Present (05/15/2017)   [employer] of Occupational Health - Occupational Stress Questionnaire    Feeling of Stress : Only a little  Social Connections: Not on file  Intimate Partner Violence: Not on file    Family History  Problem Relation Age of Onset   Hemochromatosis Mother    Basal cell carcinoma Mother    Hypertension Father    Melanoma Father        multiple removed   Colon polyps Father 32       adenomatous   Breast cancer Paternal Aunt  66   Kidney cancer Paternal Aunt    Alzheimer's disease Paternal Aunt    Hyperlipidemia Maternal Grandmother    Hypertension Maternal Grandmother    Osteoporosis Maternal Grandmother    Hyperlipidemia Maternal Grandfather    Hypertension Maternal Grandfather    Diabetes Maternal Grandfather    Stroke Maternal Grandfather    Alzheimer's disease Paternal Grandmother    Breast cancer Other        late years   Breast cancer Other        late years   Colon cancer Neg Hx      Current Outpatient Medications:    Calcium Carb-Cholecalciferol (CALCIUM + VITAMIN D3 PO), Take by mouth. Calcium 600mg  and vit d 400 units, Disp: , Rfl:    cholecalciferol (VITAMIN D) 1000 units tablet, Take 1,000 Units by mouth daily., Disp: , Rfl:    Cyanocobalamin (VITAMIN B 12 PO), Take 1,000 mcg by mouth daily., Disp: , Rfl:    PARAGARD INTRAUTERINE COPPER IU, 1  each by Intrauterine route once., Disp: , Rfl:    propranolol (INDERAL) 20 MG tablet, Take 1 tablet (20 mg total) by mouth daily., Disp: 90 tablet, Rfl: 3   tamoxifen (NOLVADEX) 20 MG tablet, Take 1 tablet (20 mg total) by mouth daily., Disp: 90 tablet, Rfl: 3 No current facility-administered medications for this visit.  Facility-Administered Medications Ordered in Other Visits:    goserelin (ZOLADEX) injection 3.6 mg, 3.6 mg, Subcutaneous, Q28 days, Creig Hines, MD  Physical exam:  Vitals:   06/13/22 0931  BP: 130/85  Pulse: 63  Resp: 18  Temp: (!) 96.9 F (36.1 C)  TempSrc: Tympanic  SpO2: 100%  Weight: 131 lb 6.4 oz (59.6 kg)  Height: 5\' 6"  (1.676 m)   Physical Exam Cardiovascular:     Rate and Rhythm: Normal rate and regular rhythm.     Heart sounds: Normal heart sounds.  Pulmonary:     Effort: Pulmonary effort is normal.  Skin:    General: Skin is warm and dry.  Neurological:     Mental Status: She is alert and oriented to person, place, and time.    Breast exam was performed in seated and lying down position. Patient is  status post right lumpectomy with a well-healed surgical scar. No evidence of any palpable masses. No evidence of axillary adenopathy. No evidence of any palpable masses or lumps in the left breast. No evidence of leftt axillary adenopathy      Latest Ref Rng & Units 06/13/2022    9:08 AM  CMP  Glucose 70 - 99 mg/dL 91   BUN 6 - 20 mg/dL 17   Creatinine 1.61 - 1.00 mg/dL 0.96   Sodium 045 - 409 mmol/L 141   Potassium 3.5 - 5.1 mmol/L 3.8   Chloride 98 - 111 mmol/L 106   CO2 22 - 32 mmol/L 30   Calcium 8.9 - 10.3 mg/dL 9.3   Total Protein 6.5 - 8.1 g/dL 6.5   Total Bilirubin 0.3 - 1.2 mg/dL 0.4   Alkaline Phos 38 - 126 U/L 43   AST 15 - 41 U/L 16   ALT 0 - 44 U/L 12       Latest Ref Rng & Units 06/13/2022    9:08 AM  CBC  WBC 4.0 - 10.5 K/uL 3.8   Hemoglobin 12.0 - 15.0 g/dL 81.1   Hematocrit 91.4 - 46.0 % 39.1   Platelets 150 - 400 K/uL 195      Assessment and plan- Patient is a 47 y.o. female  with pathological prognostic stage Ia invasive mammary carcinoma of the right breast's pT1b N0 M0 ER 51 to 90% positive PR 51 to 90% positive and HER2 negative.  She is status post cycles of adjuvant TC chemotherapy and radiation therapy.  She is here for follow-up of breast cancer on tamoxifen  Clinically patient is doing well with no concerning signs andDenies of recurrence based on today's exam.  She will be due for a mammogram in July 2024 which we will schedule.  Patient will continue tamoxifen as she was premenopausal at diagnosis and did have problems tolerating Zoladex for ovarian suppression.  She will be receiving Zometa every 6 months adjuvantly and will be due for her next dose in 3 months   Visit Diagnosis 1. Malignant neoplasm of lower-inner quadrant of right breast of female, estrogen receptor positive (HCC)   2. Encounter for monitoring tamoxifen therapy      Dr. Owens Shark, MD, MPH [facility] at  [facility] [phone] 06/13/2022 4:14 PM

## 2022-06-14 ENCOUNTER — Other Ambulatory Visit: Payer: Self-pay

## 2022-06-16 ENCOUNTER — Ambulatory Visit: Payer: 59

## 2022-07-04 ENCOUNTER — Encounter: Payer: Self-pay | Admitting: Oncology

## 2022-07-10 ENCOUNTER — Encounter: Payer: Self-pay | Admitting: Podiatry

## 2022-07-10 ENCOUNTER — Ambulatory Visit (INDEPENDENT_AMBULATORY_CARE_PROVIDER_SITE_OTHER): Payer: 59 | Admitting: Podiatry

## 2022-07-10 DIAGNOSIS — M21962 Unspecified acquired deformity of left lower leg: Secondary | ICD-10-CM

## 2022-07-10 DIAGNOSIS — Q6671 Congenital pes cavus, right foot: Secondary | ICD-10-CM

## 2022-07-10 DIAGNOSIS — Q6672 Congenital pes cavus, left foot: Secondary | ICD-10-CM

## 2022-07-10 DIAGNOSIS — Q667 Congenital pes cavus, unspecified foot: Secondary | ICD-10-CM

## 2022-07-10 DIAGNOSIS — L600 Ingrowing nail: Secondary | ICD-10-CM

## 2022-07-10 DIAGNOSIS — M21961 Unspecified acquired deformity of right lower leg: Secondary | ICD-10-CM

## 2022-07-10 NOTE — Progress Notes (Signed)
Left ingrown lateral border  Pes cavus orthotics

## 2022-07-14 ENCOUNTER — Encounter: Payer: Self-pay | Admitting: Oncology

## 2022-07-14 NOTE — Telephone Encounter (Signed)
Adrienne Harris- please look into this for her

## 2022-07-15 ENCOUNTER — Encounter: Payer: Self-pay | Admitting: Oncology

## 2022-07-15 NOTE — Telephone Encounter (Signed)
Printed and given to [place] to be done.

## 2022-07-17 ENCOUNTER — Encounter: Payer: Self-pay | Admitting: *Deleted

## 2022-07-17 DIAGNOSIS — I890 Lymphedema, not elsewhere classified: Secondary | ICD-10-CM | POA: Insufficient documentation

## 2022-07-17 NOTE — Progress Notes (Signed)
Subjective:  Patient ID: Adrienne Harris, female    DOB: Aug 20, 1975,  MRN: [id number]  Chief Complaint  Patient presents with   Nail Problem    "Dr. Allena Katz removed my toenail about a year ago.  The nail has grown back and I think it's ingrown.  I have calluses on both of my big toes."    47 y.o. female presents with the above complaint.  Patient presents with left lateral border ingrown painful to touch is progressive gotten worse worse with ambulation worse with pressure she has not seen them or else prior to seeing me for this.  She would like to have removed.  She has secondary complaint of bilateral calluses on both big toes with her high arch foot structure she again has not seen anyone as prior to seeing me she would like to discuss treatment options and get orthotics if indicated   Review of Systems: Negative except as noted in the HPI. Denies N/V/F/Ch.  Past Medical History:  Diagnosis Date   Atopic dermatitis    Basal cell carcinoma    15 years ago.   Dysrhythmia    Hemochromatosis    Heterozygous for two genes   Palpitations    a. 04/2013 48h Holter: sinus rhythm/sinus arrhythmia, rare PAC's/PVC's.   PVC (premature ventricular contraction)    Shoulder pain, left 01/24/2015   Shoulder pain, right 01/24/2015    Current Outpatient Medications:    Calcium Carb-Cholecalciferol (CALCIUM + VITAMIN D3 PO), Take by mouth. Calcium 600mg  and vit d 400 units, Disp: , Rfl:    cholecalciferol (VITAMIN D) 1000 units tablet, Take 1,000 Units by mouth daily., Disp: , Rfl:    Cyanocobalamin (VITAMIN B 12 PO), Take 1,000 mcg by mouth daily., Disp: , Rfl:    PARAGARD INTRAUTERINE COPPER IU, 1 each by Intrauterine route once., Disp: , Rfl:    propranolol (INDERAL) 20 MG tablet, Take 1 tablet (20 mg total) by mouth daily., Disp: 90 tablet, Rfl: 3   tamoxifen (NOLVADEX) 20 MG tablet, Take 1 tablet (20 mg total) by mouth daily., Disp: 90 tablet, Rfl: 3 No current facility-administered medications  for this visit.  Facility-Administered Medications Ordered in Other Visits:    goserelin (ZOLADEX) injection 3.6 mg, 3.6 mg, Subcutaneous, Q28 days, Creig Hines, MD  Social History   Tobacco Use  Smoking Status Never  Smokeless Tobacco Never    Allergies  Allergen Reactions   Propylene Glycol     Skin rash   Hydrocortisone Rash    Allergic to all topical steriods Allergic to all topical corticosteriods   Other Rash    Topical steroid    Objective:  There were no vitals filed for this visit. There is no height or weight on file to calculate BMI. Constitutional Well developed. Well nourished.  Vascular Dorsalis pedis pulses palpable bilaterally. Posterior tibial pulses palpable bilaterally. Capillary refill normal to all digits.  No cyanosis or clubbing noted. Pedal hair growth normal.  Neurologic Normal speech. Oriented to person, place, and time. Epicritic sensation to light touch grossly present bilaterally.  Dermatologic Painful ingrowing nail at lateral nail borders of the hallux nail left. No other open wounds. No skin lesions.  Orthopedic: Normal joint ROM without pain or crepitus bilaterally. No visible deformities. No bony tenderness.   Radiographs: None Assessment:   1. Pes cavus   2. Ingrown left big toenail    Plan:  Patient was evaluated and treated and all questions answered.  Pes pes cavus/foot deformity -I  explained to patient the etiology of pes planovalgus and relationship with arch pain and various treatment options were discussed.  Given patient foot structure in the setting of arch pain I believe patient will benefit from custom-made orthotics to help control the hindfoot motion support the arch of the foot and take the stress away from plantar fascial.  Patient agrees with the plan like to proceed with orthotics -Patient was casted for orthotics   Ingrown Nail, left -Patient elects to proceed with minor surgery to remove ingrown toenail  removal today. Consent reviewed and signed by patient. -Ingrown nail excised. See procedure note. -Educated on post-procedure care including soaking. Written instructions provided and reviewed. -Patient to follow up in 2 weeks for nail check.  Procedure: Excision of Ingrown Toenail Location: Left 1st toe lateral nail borders. Anesthesia: Lidocaine 1% plain; 1.5 mL and Marcaine 0.5% plain; 1.5 mL, digital block. Skin Prep: Betadine. Dressing: Silvadene; telfa; dry, sterile, compression dressing. Technique: Following skin prep, the toe was exsanguinated and a tourniquet was secured at the base of the toe. The affected nail border was freed, split with a nail splitter, and excised. Chemical matrixectomy was then performed with phenol and irrigated out with alcohol. The tourniquet was then removed and sterile dressing applied. Disposition: Patient tolerated procedure well. Patient to return in 2 weeks for follow-up.   No follow-ups on file.  Left ingrown lateral border  Pes cavus orthotics

## 2022-07-22 NOTE — Progress Notes (Unsigned)
PCP: Adrienne Grana, FNP   No chief complaint on file.   HPI:      [name] is a 47 y.o. G1P1001 whose LMP was No LMP recorded. (Menstrual status: Chemotherapy)., presents today for her annual examination.  Her menses are absent since chemo for breast cancer last yr. She {does:18564} have vasomotor sx.   Sex activity: {sex active: 315163}. Paragard placed 05/29/22. She {does:18564} have vaginal dryness.  Last Pap: 08/06/21 Results were: ASCUS with NEGATIVE high risk HPV ; repeat pap due Hx of STDs: {STD hx:14358}  Last mammogram: 8/23 results were cat 5 RT breast with ER/PR+ breast cancer on bx; has f/u mammo appt 7/24. S/p lumpectomy, chemo, radiation, is on tamoxifen and zometa currently.  There is a FH of breast cancer in 2 mat grt aunts and 1 pat aunt. There is no FH of ovarian cancer. The patient {does:18564} do self-breast exams.  Colonoscopy: 12/22 with Dr. Servando Harris;  Repeat due after 10 years.   Tobacco use: {tob:20664} Alcohol use: {Alcohol:11675} No drug use Exercise: {exercise:31265}  She {does:18564} get adequate calcium and Vitamin D in her diet.  Labs with PCP.   Patient Active Problem List   Diagnosis Date Noted   Lymphedema 07/17/2022   Genetic testing 10/24/2021   Malignant neoplasm of lower-inner quadrant of right breast of female, estrogen receptor positive (HCC) 10/01/2021   Colon cancer screening    Intractable menstrual migraine without status migrainosus 07/30/2020   Nonallopathic lesion of sacral region 12/30/2017   Trigger point of left shoulder region 10/06/2017   Slipped rib syndrome 08/05/2017   Nonallopathic lesion of rib cage 08/05/2017   Nonallopathic lesion of cervical region 08/05/2017   Nonallopathic lesion of thoracic region 08/05/2017   Nonallopathic lesion of lumbosacral region 08/05/2017   Chronic left shoulder pain 07/10/2017   Chronic LLQ pain 05/17/2017   Atopic dermatitis 07/10/2016   Hepatic cyst 07/10/2016   Routine  physical examination 07/10/2016   PVC's (premature ventricular contractions) 04/22/2013   Hemochromatosis 04/22/2013    Past Surgical History:  Procedure Laterality Date   BREAST BIOPSY Right 09/25/2021   BREAST LUMPECTOMY WITH RADIOACTIVE SEED AND SENTINEL LYMPH NODE BIOPSY Right 10/28/2021   Procedure: RIGHT BREAST LUMPECTOMY WITH RADIOACTIVE SEED AND AXILLARY SENTINEL LYMPH NODE BIOPSY;  Surgeon: Adrienne Loron, MD;  Location: [facility];  Service: General;  Laterality: Right;   COLONOSCOPY WITH PROPOFOL N/A 01/24/2021   Procedure: COLONOSCOPY WITH PROPOFOL;  Surgeon: Adrienne Minium, MD;  Location: [facility] ENDOSCOPY;  Service: Endoscopy;  Laterality: N/A;   Excision of basal cell carcinoma     PORT-A-CATH REMOVAL Left 03/13/2022   Procedure: REMOVAL PORT-A-CATH;  Surgeon: Adrienne Loron, MD;  Location: [facility];  Service: General;  Laterality: Left;   PORTACATH PLACEMENT Left 11/19/2021   Procedure: INSERTION PORT-A-CATH;  Surgeon: Adrienne Loron, MD;  Location: [facility];  Service: General;  Laterality: Left;   WISDOM TOOTH EXTRACTION      Family History  Problem Relation Age of Onset   Hemochromatosis Mother    Basal cell carcinoma Mother    Hypertension Father    Melanoma Father        multiple removed   Colon polyps Father 46       adenomatous   Breast cancer Paternal Aunt 37   Kidney cancer Paternal Aunt    Alzheimer's disease Paternal Aunt    Hyperlipidemia Maternal Grandmother    Hypertension Maternal Grandmother    Osteoporosis  Maternal Grandmother    Hyperlipidemia Maternal Grandfather    Hypertension Maternal Grandfather    Diabetes Maternal Grandfather    Stroke Maternal Grandfather    Alzheimer's disease Paternal Grandmother    Breast cancer Other        late years   Breast cancer Other        late years   Colon cancer Neg Hx     Social History   Socioeconomic History   Marital status: Married     Spouse name: Not on file   Number of children: 1   Years of education: Not on file   Highest education level: Not on file  Occupational History   Occupation: Dietitian  Tobacco Use   Smoking status: Never   Smokeless tobacco: Never  Vaping Use   Vaping Use: Never used  Substance and Sexual Activity   Alcohol use: Not Currently    Alcohol/week: 2.0 standard drinks of alcohol    Types: 2 Cans of beer per week    Comment: 2-4 beer/wine aweek.   Drug use: No   Sexual activity: Not Currently    Partners: Male    Birth control/protection: None  Other Topics Concern   Not on file  Social History Narrative   Married      Daughter -16 yrs.       Dietitian for [employer]   Social Determinants of Health   Financial Resource Strain: Not on file  Food Insecurity: Not on file  Transportation Needs: Not on file  Physical Activity: Inactive (05/15/2017)   Exercise Vital Sign    Days of Exercise per Week: 0 days    Minutes of Exercise per Session: 0 min  Stress: No Stress Concern Present (05/15/2017)   [employer] of Occupational Health - Occupational Stress Questionnaire    Feeling of Stress : Only a little  Social Connections: Not on file  Intimate Partner Violence: Not on file     Current Outpatient Medications:    Calcium Carb-Cholecalciferol (CALCIUM + VITAMIN D3 PO), Take by mouth. Calcium 600mg  and vit d 400 units, Disp: , Rfl:    cholecalciferol (VITAMIN D) 1000 units tablet, Take 1,000 Units by mouth daily., Disp: , Rfl:    Cyanocobalamin (VITAMIN B 12 PO), Take 1,000 mcg by mouth daily., Disp: , Rfl:    PARAGARD INTRAUTERINE COPPER IU, 1 each by Intrauterine route once., Disp: , Rfl:    propranolol (INDERAL) 20 MG tablet, Take 1 tablet (20 mg total) by mouth daily., Disp: 90 tablet, Rfl: 3   tamoxifen (NOLVADEX) 20 MG tablet, Take 1 tablet (20 mg total) by mouth daily., Disp: 90 tablet, Rfl: 3 No current facility-administered medications for this  visit.  Facility-Administered Medications Ordered in Other Visits:    goserelin (ZOLADEX) injection 3.6 mg, 3.6 mg, Subcutaneous, Q28 days, Adrienne Hines, MD     ROS:  Review of Systems BREAST: No symptoms    Objective: There were no vitals taken for this visit.   OBGyn Exam  Results: No results found for this or any previous visit (from the past 24 hour(s)).  Assessment/Plan:  No diagnosis found.   No orders of the defined types were placed in this encounter.           GYN counsel {counseling: 16159}    F/U  No follow-ups on file.  Adrienne Ardila B. Stanislaw Acton, PA-C 07/22/2022 1:12 PM

## 2022-07-24 ENCOUNTER — Encounter: Payer: Self-pay | Admitting: Obstetrics and Gynecology

## 2022-07-24 ENCOUNTER — Other Ambulatory Visit (HOSPITAL_COMMUNITY)
Admission: RE | Admit: 2022-07-24 | Discharge: 2022-07-24 | Disposition: A | Discharge status: Home / Self Care | Payer: 59 | Source: Ambulatory Visit | Attending: Obstetrics and Gynecology | Admitting: Obstetrics and Gynecology

## 2022-07-24 ENCOUNTER — Ambulatory Visit (INDEPENDENT_AMBULATORY_CARE_PROVIDER_SITE_OTHER): Payer: 59 | Admitting: Obstetrics and Gynecology

## 2022-07-24 VITALS — BP 114/70 | Ht 66.0 in | Wt 133.0 lb

## 2022-07-24 DIAGNOSIS — Z01419 Encounter for gynecological examination (general) (routine) without abnormal findings: Secondary | ICD-10-CM | POA: Diagnosis not present

## 2022-07-24 DIAGNOSIS — Z170 Estrogen receptor positive status [ER+]: Secondary | ICD-10-CM

## 2022-07-24 DIAGNOSIS — R87610 Atypical squamous cells of undetermined significance on cytologic smear of cervix (ASC-US): Secondary | ICD-10-CM | POA: Diagnosis not present

## 2022-07-24 DIAGNOSIS — Z30431 Encounter for routine checking of intrauterine contraceptive device: Secondary | ICD-10-CM

## 2022-07-24 DIAGNOSIS — Z1231 Encounter for screening mammogram for malignant neoplasm of breast: Secondary | ICD-10-CM

## 2022-07-24 DIAGNOSIS — Z124 Encounter for screening for malignant neoplasm of cervix: Secondary | ICD-10-CM | POA: Insufficient documentation

## 2022-07-24 DIAGNOSIS — Z1151 Encounter for screening for human papillomavirus (HPV): Secondary | ICD-10-CM | POA: Diagnosis not present

## 2022-07-24 DIAGNOSIS — Z79810 Long term (current) use of selective estrogen receptor modulators (SERMs): Secondary | ICD-10-CM

## 2022-07-24 DIAGNOSIS — C50311 Malignant neoplasm of lower-inner quadrant of right female breast: Secondary | ICD-10-CM

## 2022-07-24 NOTE — Patient Instructions (Signed)
I value your feedback and you entrusting us with your care. If you get a [facility] patient survey, I would appreciate you taking the time to let us know about your experience today. Thank you! ? ? ?

## 2022-07-28 LAB — CYTOLOGY - PAP
Comment: NEGATIVE
Diagnosis: NEGATIVE
High risk HPV: NEGATIVE

## 2022-07-29 ENCOUNTER — Telehealth: Payer: Self-pay | Admitting: Podiatry

## 2022-07-29 NOTE — Telephone Encounter (Signed)
Lmom for pt to call and schedule picking up orthotics     Balance pending insurance   Currently in [place] will send to [place] when scheduled

## 2022-08-01 DIAGNOSIS — C50311 Malignant neoplasm of lower-inner quadrant of right female breast: Secondary | ICD-10-CM | POA: Diagnosis not present

## 2022-08-01 DIAGNOSIS — Z483 Aftercare following surgery for neoplasm: Secondary | ICD-10-CM | POA: Diagnosis not present

## 2022-08-29 ENCOUNTER — Ambulatory Visit (INDEPENDENT_AMBULATORY_CARE_PROVIDER_SITE_OTHER): Payer: 59 | Admitting: Podiatry

## 2022-08-29 DIAGNOSIS — M21961 Unspecified acquired deformity of right lower leg: Secondary | ICD-10-CM

## 2022-08-29 DIAGNOSIS — Q667 Congenital pes cavus, unspecified foot: Secondary | ICD-10-CM

## 2022-08-29 NOTE — Progress Notes (Signed)
Patient presents today to pick up custom orthotics   Patient was dispensed 1 pair of custom orthotics  Fit was satisfactory. Instructions for break-in and wear was reviewed and a copy was given to the patient.    

## 2022-09-02 ENCOUNTER — Other Ambulatory Visit: Payer: 59

## 2022-09-04 ENCOUNTER — Ambulatory Visit
Admission: RE | Admit: 2022-09-04 | Discharge: 2022-09-04 | Disposition: A | Discharge status: Home / Self Care | Payer: 59 | Source: Ambulatory Visit | Attending: Oncology | Admitting: Oncology

## 2022-09-04 DIAGNOSIS — C50311 Malignant neoplasm of lower-inner quadrant of right female breast: Secondary | ICD-10-CM | POA: Diagnosis not present

## 2022-09-04 DIAGNOSIS — Z170 Estrogen receptor positive status [ER+]: Secondary | ICD-10-CM

## 2022-09-04 DIAGNOSIS — Z853 Personal history of malignant neoplasm of breast: Secondary | ICD-10-CM | POA: Diagnosis not present

## 2022-09-04 DIAGNOSIS — R92333 Mammographic heterogeneous density, bilateral breasts: Secondary | ICD-10-CM | POA: Diagnosis not present

## 2022-09-08 ENCOUNTER — Ambulatory Visit: Payer: 59 | Attending: General Surgery | Admitting: Physical Therapy

## 2022-09-08 DIAGNOSIS — Z483 Aftercare following surgery for neoplasm: Secondary | ICD-10-CM | POA: Insufficient documentation

## 2022-09-08 NOTE — Therapy (Signed)
OUTPATIENT PHYSICAL THERAPY SOZO SCREENING NOTE   Patient Name: Adrienne Harris MRN: [id number] DOB:06-25-1975, 47 y.o., female Today's Date: 09/08/2022  PCP: Allegra Grana, FNP REFERRING PROVIDER: Emelia Loron, MD   PT End of Session - 09/08/22 1659     Visit Number 9    PT Start Time 1643    PT Stop Time 1655    PT Time Calculation (min) 12 min    Activity Tolerance Patient tolerated treatment well    Behavior During Therapy WFL for tasks assessed/performed             Past Medical History:  Diagnosis Date   Atopic dermatitis    Basal cell carcinoma    15 years ago.   BRCA negative 2023   Invitae panel neg at [facility]   Breast cancer in female [facility])    Dysrhythmia    Hemochromatosis    Heterozygous for two genes   Palpitations    a. 04/2013 48h Holter: sinus rhythm/sinus arrhythmia, rare PAC's/PVC's.   PVC (premature ventricular contraction)    Shoulder pain, left 01/24/2015   Shoulder pain, right 01/24/2015   Past Surgical History:  Procedure Laterality Date   BREAST BIOPSY Right 09/25/2021   INVASIVE MAMMARY CARCINOMA,   BREAST LUMPECTOMY Right 10/28/2021   INVASIVE MAMMARY CARCINOMA,   BREAST LUMPECTOMY WITH RADIOACTIVE SEED AND SENTINEL LYMPH NODE BIOPSY Right 10/28/2021   Procedure: RIGHT BREAST LUMPECTOMY WITH RADIOACTIVE SEED AND AXILLARY SENTINEL LYMPH NODE BIOPSY;  Surgeon: Emelia Loron, MD;  Location: [facility];  Service: General;  Laterality: Right;   COLONOSCOPY WITH PROPOFOL N/A 01/24/2021   Procedure: COLONOSCOPY WITH PROPOFOL;  Surgeon: Midge Minium, MD;  Location: [facility];  Service: Endoscopy;  Laterality: N/A;   Excision of basal cell carcinoma     PORT-A-CATH REMOVAL Left 03/13/2022   Procedure: REMOVAL PORT-A-CATH;  Surgeon: Emelia Loron, MD;  Location: [facility];  Service: General;  Laterality: Left;   PORTACATH PLACEMENT Left 11/19/2021   Procedure: INSERTION PORT-A-CATH;   Surgeon: Emelia Loron, MD;  Location: [facility];  Service: General;  Laterality: Left;   WISDOM TOOTH EXTRACTION     Patient Active Problem List   Diagnosis Date Noted   Use of tamoxifen (Nolvadex) 07/24/2022   Lymphedema 07/17/2022   Genetic testing 10/24/2021   Malignant neoplasm of lower-inner quadrant of right breast of female, estrogen receptor positive (HCC) 10/01/2021   Colon cancer screening    Intractable menstrual migraine without status migrainosus 07/30/2020   Nonallopathic lesion of sacral region 12/30/2017   Trigger point of left shoulder region 10/06/2017   Slipped rib syndrome 08/05/2017   Nonallopathic lesion of rib cage 08/05/2017   Nonallopathic lesion of cervical region 08/05/2017   Nonallopathic lesion of thoracic region 08/05/2017   Nonallopathic lesion of lumbosacral region 08/05/2017   Chronic left shoulder pain 07/10/2017   Chronic LLQ pain 05/17/2017   Atopic dermatitis 07/10/2016   Hepatic cyst 07/10/2016   Routine physical examination 07/10/2016   PVC's (premature ventricular contractions) 04/22/2013   Hemochromatosis 04/22/2013    REFERRING DIAG: right breast cancer at risk for lymphedema  THERAPY DIAG:  Aftercare following surgery for neoplasm  PERTINENT HISTORY:  Patient was diagnosed on 09/25/2021 with right grade 3 invasive mammary carcinoma of no particular type. She underwent a right lumpectomy and sentinel node biopsy on 09/28/2021. It is ER/PR positive and HER2 negative and the Ki67 was not measured.    PRECAUTIONS: right UE Lymphedema risk  SUBJECTIVE: Here for SOZO screen  PAIN:  Are you having pain? No  SOZO SCREENING: Patient was assessed today using the SOZO machine to determine the lymphedema index score. This was compared to her baseline score. It was determined that she is within the recommended range when compared to her baseline and no further action is needed at this time. She will continue SOZO screenings.  These are done every 3 months for 2 years post operatively followed by every 6 months for 2 years, and then annually.   L-DEX FLOWSHEETS - 09/08/22 1700       L-DEX LYMPHEDEMA SCREENING   Measurement Type Unilateral    L-DEX MEASUREMENT EXTREMITY Upper Extremity    POSITION  Standing    DOMINANT SIDE Right    At Risk Side Right    BASELINE SCORE (UNILATERAL) -6.4    L-DEX SCORE (UNILATERAL) -2.7    VALUE CHANGE (UNILAT) 3.7            Bethann Punches, [place] 09/08/22 5:01 PM

## 2022-09-09 ENCOUNTER — Other Ambulatory Visit: Payer: Self-pay

## 2022-09-10 ENCOUNTER — Encounter (INDEPENDENT_AMBULATORY_CARE_PROVIDER_SITE_OTHER): Payer: Self-pay

## 2022-09-12 ENCOUNTER — Encounter: Payer: 59 | Admitting: Family

## 2022-09-19 ENCOUNTER — Inpatient Hospital Stay: Payer: 59 | Admitting: Oncology

## 2022-09-19 ENCOUNTER — Inpatient Hospital Stay: Payer: 59

## 2022-09-19 ENCOUNTER — Encounter: Payer: Self-pay | Admitting: Oncology

## 2022-09-19 ENCOUNTER — Inpatient Hospital Stay: Payer: 59 | Attending: Oncology

## 2022-09-19 VITALS — BP 126/93 | HR 73 | Temp 97.5°F | Resp 16 | Ht 66.0 in | Wt 135.7 lb

## 2022-09-19 DIAGNOSIS — Z170 Estrogen receptor positive status [ER+]: Secondary | ICD-10-CM

## 2022-09-19 DIAGNOSIS — Z7983 Long term (current) use of bisphosphonates: Secondary | ICD-10-CM

## 2022-09-19 DIAGNOSIS — Z5181 Encounter for therapeutic drug level monitoring: Secondary | ICD-10-CM

## 2022-09-19 DIAGNOSIS — Z79810 Long term (current) use of selective estrogen receptor modulators (SERMs): Secondary | ICD-10-CM | POA: Diagnosis not present

## 2022-09-19 DIAGNOSIS — C50311 Malignant neoplasm of lower-inner quadrant of right female breast: Secondary | ICD-10-CM | POA: Insufficient documentation

## 2022-09-19 DIAGNOSIS — C50919 Malignant neoplasm of unspecified site of unspecified female breast: Secondary | ICD-10-CM

## 2022-09-19 LAB — CBC WITH DIFFERENTIAL/PLATELET
Abs Immature Granulocytes: 0.01 10*3/uL (ref 0.00–0.07)
Basophils Absolute: 0 10*3/uL (ref 0.0–0.1)
Basophils Relative: 1 %
Eosinophils Absolute: 0.1 10*3/uL (ref 0.0–0.5)
Eosinophils Relative: 1 %
HCT: 38 % (ref 36.0–46.0)
Hemoglobin: 12.7 g/dL (ref 12.0–15.0)
Immature Granulocytes: 0 %
Lymphocytes Relative: 40 %
Lymphs Abs: 1.8 10*3/uL (ref 0.7–4.0)
MCH: 33 pg (ref 26.0–34.0)
MCHC: 33.4 g/dL (ref 30.0–36.0)
MCV: 98.7 fL (ref 80.0–100.0)
Monocytes Absolute: 0.4 10*3/uL (ref 0.1–1.0)
Monocytes Relative: 9 %
Neutro Abs: 2.2 10*3/uL (ref 1.7–7.7)
Neutrophils Relative %: 49 %
Platelets: 195 10*3/uL (ref 150–400)
RBC: 3.85 MIL/uL — ABNORMAL LOW (ref 3.87–5.11)
RDW: 11.4 % — ABNORMAL LOW (ref 11.5–15.5)
WBC: 4.4 10*3/uL (ref 4.0–10.5)
nRBC: 0 % (ref 0.0–0.2)

## 2022-09-19 LAB — COMPREHENSIVE METABOLIC PANEL
ALT: 14 U/L (ref 0–44)
AST: 16 U/L (ref 15–41)
Albumin: 3.8 g/dL (ref 3.5–5.0)
Alkaline Phosphatase: 46 U/L (ref 38–126)
Anion gap: 5 (ref 5–15)
BUN: 13 mg/dL (ref 6–20)
CO2: 28 mmol/L (ref 22–32)
Calcium: 9 mg/dL (ref 8.9–10.3)
Chloride: 106 mmol/L (ref 98–111)
Creatinine, Ser: 0.79 mg/dL (ref 0.44–1.00)
GFR, Estimated: >60 mL/min (ref >60)
Glucose, Bld: 100 mg/dL — ABNORMAL HIGH (ref 70–99)
Potassium: 3.5 mmol/L (ref 3.5–5.1)
Sodium: 139 mmol/L (ref 135–145)
Total Bilirubin: 0.4 mg/dL (ref 0.3–1.2)
Total Protein: 6.6 g/dL (ref 6.5–8.1)

## 2022-09-19 LAB — FERRITIN: Ferritin: 104 ng/mL (ref 11–307)

## 2022-09-19 MED ORDER — ZOLEDRONIC ACID 4 MG/100ML IV SOLN
4.0000 mg | INTRAVENOUS | Status: DC
  Administered 2022-09-19: 4 mg via INTRAVENOUS
  Filled 2022-09-19: qty 100

## 2022-09-19 MED ORDER — SODIUM CHLORIDE 0.9 % IV SOLN
Freq: Once | INTRAVENOUS | Status: AC
  Filled 2022-09-19: qty 250

## 2022-09-20 ENCOUNTER — Encounter: Payer: Self-pay | Admitting: Oncology

## 2022-09-20 NOTE — Progress Notes (Signed)
Hematology/Oncology Consult note [facility]  [phone] [phone]  Patient Care Team: Allegra Grana, FNP as PCP - General (Family Medicine) Hulen Luster, RN as Oncology Nurse Navigator Creig Hines, MD as Consulting Physician (Oncology) Emelia Loron, MD as Consulting Physician (General Surgery) Lonie Peak, MD as Attending Physician (Radiation Oncology)   Name of the patient: Adrienne Harris  [id number]  May 02, 1975   Date of visit: 09/20/22  Diagnosis- pathological prognostic stage Ia invasive mammary carcinoma of the right breast ER/PR positive HER2 negative       Chief complaint/ Reason for visit-in follow-up of breast cancer  Heme/Onc history: patient is a 47 year old female who recently underwent aLateral screening mammogram which showed a possible area of concern in the right breast.  This was followed by diagnostic mammogram and ultrasound which showed a 7 mm mass in the right lower breast.  No suspicious right axillary adenopathy.  Biopsy confirmed invasive mammary carcinoma 6 mm grade 3 ER 51 to 90% positive PR 51 to 90% positive and HER2 negative.     Oncotype score came back at 23 with an absolute chemotherapy benefit of about 6.5%.  Adjuvant TC chemotherapy was recommended.  4 cycles of chemotherapy were completed on 01/29/2022.  Patient is currently undergoing adjuvant radiation therapy.  Interval history- tolerating tamoxifen well. She used to have dizzy spells especially when she changed her position after chemotherapy but those symptoms are slowly going away.  ECOG PS- 0 Pain scale- 0   Review of systems- Review of Systems  Constitutional:  Negative for chills, fever, malaise/fatigue and weight loss.  HENT:  Negative for congestion, ear discharge and nosebleeds.   Eyes:  Negative for blurred vision.  Respiratory:  Negative for cough, hemoptysis, sputum production, shortness of breath and wheezing.    Cardiovascular:  Negative for chest pain, palpitations, orthopnea and claudication.  Gastrointestinal:  Negative for abdominal pain, blood in stool, constipation, diarrhea, heartburn, melena, nausea and vomiting.  Genitourinary:  Negative for dysuria, flank pain, frequency, hematuria and urgency.  Musculoskeletal:  Negative for back pain, joint pain and myalgias.  Skin:  Negative for rash.  Neurological:  Negative for dizziness, tingling, focal weakness, seizures, weakness and headaches.  Endo/Heme/Allergies:  Does not bruise/bleed easily.  Psychiatric/Behavioral:  Negative for depression and suicidal ideas. The patient does not have insomnia.       Allergies  Allergen Reactions   Propylene Glycol     Skin rash   Hydrocortisone Rash    Allergic to all topical steriods Allergic to all topical corticosteriods   Other Rash    Topical steroid      Past Medical History:  Diagnosis Date   Atopic dermatitis    Basal cell carcinoma    15 years ago.   BRCA negative 2023   Invitae panel neg at [facility]   Breast cancer in female [facility])    Dysrhythmia    Hemochromatosis    Heterozygous for two genes   Palpitations    a. 04/2013 48h Holter: sinus rhythm/sinus arrhythmia, rare PAC's/PVC's.   PVC (premature ventricular contraction)    Shoulder pain, left 01/24/2015   Shoulder pain, right 01/24/2015     Past Surgical History:  Procedure Laterality Date   BREAST BIOPSY Right 09/25/2021   INVASIVE MAMMARY CARCINOMA,   BREAST LUMPECTOMY Right 10/28/2021   INVASIVE MAMMARY CARCINOMA,   BREAST LUMPECTOMY WITH RADIOACTIVE SEED AND SENTINEL LYMPH NODE BIOPSY Right 10/28/2021   Procedure: RIGHT BREAST  LUMPECTOMY WITH RADIOACTIVE SEED AND AXILLARY SENTINEL LYMPH NODE BIOPSY;  Surgeon: Emelia Loron, MD;  Location: [facility];  Service: General;  Laterality: Right;   COLONOSCOPY WITH PROPOFOL N/A 01/24/2021   Procedure: COLONOSCOPY WITH PROPOFOL;  Surgeon: Midge Minium, MD;  Location: [facility];  Service: Endoscopy;  Laterality: N/A;   Excision of basal cell carcinoma     PORT-A-CATH REMOVAL Left 03/13/2022   Procedure: REMOVAL PORT-A-CATH;  Surgeon: Emelia Loron, MD;  Location: [facility];  Service: General;  Laterality: Left;   PORTACATH PLACEMENT Left 11/19/2021   Procedure: INSERTION PORT-A-CATH;  Surgeon: Emelia Loron, MD;  Location: [facility];  Service: General;  Laterality: Left;   WISDOM TOOTH EXTRACTION      Social History   Socioeconomic History   Marital status: Married    Spouse name: Not on file   Number of children: 1   Years of education: Not on file   Highest education level: Not on file  Occupational History   Occupation: Dietitian  Tobacco Use   Smoking status: Never   Smokeless tobacco: Never  Vaping Use   Vaping status: Never Used  Substance and Sexual Activity   Alcohol use: Not Currently    Alcohol/week: 2.0 standard drinks of alcohol    Types: 2 Cans of beer per week    Comment: 2-4 beer/wine aweek.   Drug use: No   Sexual activity: Not Currently    Partners: Male    Birth control/protection: None  Other Topics Concern   Not on file  Social History Narrative   Married      Daughter -16 yrs.       Dietitian for [employer]   Social Determinants of Health   Financial Resource Strain: Not on file  Food Insecurity: Not on file  Transportation Needs: Not on file  Physical Activity: Inactive (05/15/2017)   Exercise Vital Sign    Days of Exercise per Week: 0 days    Minutes of Exercise per Session: 0 min  Stress: No Stress Concern Present (05/15/2017)   [employer] of Occupational Health - Occupational Stress Questionnaire    Feeling of Stress : Only a little  Social Connections: Not on file  Intimate Partner Violence: Not on file    Family History  Problem Relation Age of Onset   Hemochromatosis Mother    Basal cell carcinoma Mother    Hypertension  Father    Melanoma Father        multiple removed   Colon polyps Father 28       adenomatous   Breast cancer Paternal Aunt 65   Kidney cancer Paternal Aunt    Alzheimer's disease Paternal Aunt    Hyperlipidemia Maternal Grandmother    Hypertension Maternal Grandmother    Osteoporosis Maternal Grandmother    Hyperlipidemia Maternal Grandfather    Hypertension Maternal Grandfather    Diabetes Maternal Grandfather    Stroke Maternal Grandfather    Alzheimer's disease Paternal Grandmother    Breast cancer Other        late years   Breast cancer Other        late years   Colon cancer Neg Hx      Current Outpatient Medications:    Calcium Carb-Cholecalciferol (CALCIUM + VITAMIN D3 PO), Take by mouth. Calcium 600mg  and vit d 400 units, Disp: , Rfl:    cholecalciferol (VITAMIN D) 1000 units tablet, Take 1,000 Units by mouth daily., Disp: , Rfl:  Cyanocobalamin (VITAMIN B 12 PO), Take 1,000 mcg by mouth daily., Disp: , Rfl:    PARAGARD INTRAUTERINE COPPER IU, 1 each by Intrauterine route once., Disp: , Rfl:    propranolol (INDERAL) 20 MG tablet, Take 1 tablet (20 mg total) by mouth daily., Disp: 90 tablet, Rfl: 3   tamoxifen (NOLVADEX) 20 MG tablet, Take 1 tablet (20 mg total) by mouth daily., Disp: 90 tablet, Rfl: 3 No current facility-administered medications for this visit.  Facility-Administered Medications Ordered in Other Visits:    goserelin (ZOLADEX) injection 3.6 mg, 3.6 mg, Subcutaneous, Q28 days, Creig Hines, MD  Physical exam:  Vitals:   09/19/22 1455  BP: (!) 126/93  Pulse: 73  Resp: 16  Temp: (!) 97.5 F (36.4 C)  TempSrc: Tympanic  SpO2: 100%  Weight: 135 lb 11.2 oz (61.6 kg)  Height: 5\' 6"  (1.676 m)   Physical Exam Cardiovascular:     Rate and Rhythm: Normal rate and regular rhythm.     Heart sounds: Normal heart sounds.  Pulmonary:     Effort: Pulmonary effort is normal.     Breath sounds: Normal breath sounds.  Skin:    General: Skin is warm and  dry.  Neurological:     Mental Status: She is alert and oriented to person, place, and time.    Breast exam was performed in seated and lying down position. Patient is status post right lumpectomy with a well-healed surgical scar. No evidence of any palpable masses. No evidence of axillary adenopathy. No evidence of any palpable masses or lumps in the left breast. No evidence of leftt axillary adenopathy      Latest Ref Rng & Units 09/19/2022    2:41 PM  CMP  Glucose 70 - 99 mg/dL 829   BUN 6 - 20 mg/dL 13   Creatinine 5.62 - 1.00 mg/dL 1.30   Sodium 865 - 784 mmol/L 139   Potassium 3.5 - 5.1 mmol/L 3.5   Chloride 98 - 111 mmol/L 106   CO2 22 - 32 mmol/L 28   Calcium 8.9 - 10.3 mg/dL 9.0   Total Protein 6.5 - 8.1 g/dL 6.6   Total Bilirubin 0.3 - 1.2 mg/dL 0.4   Alkaline Phos 38 - 126 U/L 46   AST 15 - 41 U/L 16   ALT 0 - 44 U/L 14       Latest Ref Rng & Units 09/19/2022    2:41 PM  CBC  WBC 4.0 - 10.5 K/uL 4.4   Hemoglobin 12.0 - 15.0 g/dL 69.6   Hematocrit 29.5 - 46.0 % 38.0   Platelets 150 - 400 K/uL 195     No images are attached to the encounter.  MM DIAG BREAST TOMO BILATERAL  Result Date: 09/04/2022 CLINICAL DATA:  History of right breast cancer status post lumpectomy in September of 2023. EXAM: DIGITAL DIAGNOSTIC BILATERAL MAMMOGRAM WITH TOMOSYNTHESIS AND CAD TECHNIQUE: Bilateral digital diagnostic mammography and breast tomosynthesis was performed. The images were evaluated with computer-aided detection. COMPARISON:  Previous exam(s). ACR Breast Density Category c: The breasts are heterogeneously dense, which may obscure small masses. FINDINGS: Lumpectomy changes are seen in the right breast. No suspicious mass or malignant type microcalcifications identified in either breast. IMPRESSION: No evidence of malignancy in either breast. RECOMMENDATION: Bilateral diagnostic mammogram in 1 year is recommended. I have discussed the findings and recommendations with the patient. If  applicable, a reminder letter will be sent to the patient regarding the next appointment. BI-RADS CATEGORY  2: Benign. Electronically Signed   By: Baird Lyons M.D.   On: 09/04/2022 15:26     Assessment and plan- Patient is a 47 y.o. female with pathological prognostic stage Ia invasive mammary carcinoma of the right breast's pT1b N0 M0 ER 51 to 90% positive PR 51 to 90% positive and HER2 negative.  She is status post cycles of adjuvant TC chemotherapy and radiation therapy.  She is here for a routine follow-up visit  Patient is due for her next dose of Zometa today.  Calcium levels acceptable to proceed with Zometa.  Clinically patient is doing well with no concerning signs and Symptoms of recurrence based on today's examshe had a mammogram in July 2024 as well which was unremarkable.  Given that she is premenopausal, she is currently on tamoxifen which she will take for 10 years.  She could not tolerate Zoladex.  She has not had any return of menstrual cycle since chemotherapy which ended in December 2023.  I will see her back in 6 months for her next dose of Zometa and I will be checking her hormone levels including FSH estradiol and inhibin B on that day.  If levels are postmenopausal and she does not have menstrual cycles for over a year I will switch her from tamoxifen to AI at that time.   Visit Diagnosis 1. Malignant neoplasm of lower-inner quadrant of right breast of female, estrogen receptor positive (HCC)   2. Encounter for monitoring zoledronate therapy   3. Use of tamoxifen (Nolvadex)      Dr. Owens Shark, MD, MPH [facility] [phone] 09/20/2022 10:07 AM

## 2022-09-23 ENCOUNTER — Other Ambulatory Visit: Payer: Self-pay | Admitting: *Deleted

## 2022-09-23 DIAGNOSIS — C50311 Malignant neoplasm of lower-inner quadrant of right female breast: Secondary | ICD-10-CM

## 2022-09-29 ENCOUNTER — Encounter: Payer: Self-pay | Admitting: *Deleted

## 2022-10-07 ENCOUNTER — Ambulatory Visit (INDEPENDENT_AMBULATORY_CARE_PROVIDER_SITE_OTHER): Payer: 59 | Admitting: Family

## 2022-10-07 ENCOUNTER — Encounter: Payer: Self-pay | Admitting: Family

## 2022-10-07 ENCOUNTER — Other Ambulatory Visit: Payer: Self-pay

## 2022-10-07 VITALS — BP 100/70 | HR 77 | Temp 98.0°F | Resp 17 | Ht 66.0 in | Wt 135.5 lb

## 2022-10-07 DIAGNOSIS — Z0000 Encounter for general adult medical examination without abnormal findings: Secondary | ICD-10-CM

## 2022-10-07 DIAGNOSIS — I493 Ventricular premature depolarization: Secondary | ICD-10-CM

## 2022-10-07 DIAGNOSIS — Z136 Encounter for screening for cardiovascular disorders: Secondary | ICD-10-CM

## 2022-10-07 DIAGNOSIS — Z170 Estrogen receptor positive status [ER+]: Secondary | ICD-10-CM

## 2022-10-07 DIAGNOSIS — Z13220 Encounter for screening for lipoid disorders: Secondary | ICD-10-CM

## 2022-10-07 DIAGNOSIS — C50311 Malignant neoplasm of lower-inner quadrant of right female breast: Secondary | ICD-10-CM

## 2022-10-07 MED ORDER — PROPRANOLOL HCL 20 MG PO TABS
20.0000 mg | ORAL_TABLET | Freq: Every day | ORAL | 3 refills | Status: DC
Start: 2022-10-07 — End: 2023-10-09
  Filled 2022-10-07 – 2022-12-09 (×2): qty 90, 90d supply, fill #0
  Filled 2023-03-08: qty 90, 90d supply, fill #1
  Filled 2023-06-03: qty 90, 90d supply, fill #2
  Filled 2023-09-01: qty 90, 90d supply, fill #3

## 2022-10-07 NOTE — Progress Notes (Signed)
Assessment & Plan:  Encounter for lipid screening for cardiovascular disease -     Lipid panel; Future  PVC's (premature ventricular contractions) -     Propranolol HCl; Take 1 tablet (20 mg total) by mouth daily.  Dispense: 90 tablet; Refill: 3 -     TSH; Future -     B12 and Folate Panel; Future  Routine physical examination Assessment & Plan: Congratulated patient on diligence to exercise.  Deferred breast exam as patient is following with GYN and this has been her preference.  Pap smear and colonoscopy are up-to-date.  Of note, we discussed options for treatment for vasomotor symptoms including Paxil, gabapentin.  We have discussed over-the-counter Unisom to aid in overall sleep.  she will let me know if symptoms affect quality life and she would like to start medication.   Orders: -     Hemoglobin A1c; Future -     Lipid panel; Future -     Propranolol HCl; Take 1 tablet (20 mg total) by mouth daily.  Dispense: 90 tablet; Refill: 3 -     TSH; Future -     B12 and Folate Panel; Future  Malignant neoplasm of lower-inner quadrant of right breast of female, estrogen receptor positive (HCC)     Return precautions given.   Risks, benefits, and alternatives of the medications and treatment plan prescribed today were discussed, and patient expressed understanding.   Education regarding symptom management and diagnosis given to patient on AVS either electronically or printed.  Return in about 1 year (around 10/07/2023) for Complete Physical Exam.  Rennie Plowman, FNP  Subjective:    Patient ID: Adrienne Harris, female    DOB: Mar 14, 1975, 47 y.o.   MRN: [id number]  CC: Adrienne Harris is a 47 y.o. female who presents today for physical exam.    HPI: Overall is well today.  She is coping well with recent diagnosis of breast cancer.  She does describe hot flashes at night over the past several months.  Coincides with lack of menses, starting tamoxifen.   History of basal cell  carcinoma.Following with Dr Adolphus Birchwood  Colorectal Cancer Screening: UTD , 2022 Dr. Servando Snare; repeat in 5 years Breast Cancer Screening: Mammogram UTD 09/03/22; diagnosed right breast cancer, status postlumpectomy September 2023; she continues to follow with Dr. Smith Robert. She never started Zometa.  Compliant with tamoxifen Cervical Cancer Screening:Follows with GYN. UTD, 07/24/2022, negative HPV , NILM Bone Health screening/DEXA for 65+: No increased fracture risk. Defer screening at this time.  Lung Cancer Screening: Doesn't have 20 year pack year history and age > 77 years yo 86 years       Tetanus - UTD         Exercise: Gets regular exercise, 4-5 times per week walking on treadmill.  Alcohol use: Occasional Smoking/tobacco use: Nonsmoker.    Health Maintenance  Topic Date Due   Hepatitis C Screening  Never done   COVID-19 Vaccine (4 - 2023-24 season) 10/11/2021   Pap Smear  07/23/2025   Colon Cancer Screening  01/24/2026   DTaP/Tdap/Td vaccine (2 - Td or Tdap) 09/06/2029   HIV Screening  Completed   HPV Vaccine  Aged Out    ALLERGIES: Propylene glycol, Hydrocortisone, and Other  Current Outpatient Medications on File Prior to Visit  Medication Sig Dispense Refill   Calcium Carb-Cholecalciferol (CALCIUM + VITAMIN D3 PO) Take by mouth. Calcium 600mg  and vit d 400 units take 2 once a day     cholecalciferol (  VITAMIN D) 1000 units tablet Take 1,000 Units by mouth daily.     Cyanocobalamin (VITAMIN B 12 PO) Take 1,000 mcg by mouth daily.     PARAGARD INTRAUTERINE COPPER IU 1 each by Intrauterine route once.     tamoxifen (NOLVADEX) 20 MG tablet Take 1 tablet (20 mg total) by mouth daily. 90 tablet 3   Current Facility-Administered Medications on File Prior to Visit  Medication Dose Route Frequency Provider Last Rate Last Admin   goserelin (ZOLADEX) injection 3.6 mg  3.6 mg Subcutaneous Q28 days Creig Hines, MD        Review of Systems  Constitutional:  Positive for diaphoresis (at night).  Negative for chills, fever and unexpected weight change.  HENT:  Negative for congestion.   Respiratory:  Negative for cough.   Cardiovascular:  Negative for chest pain, palpitations and leg swelling.  Gastrointestinal:  Negative for nausea and vomiting.  Musculoskeletal:  Negative for arthralgias and myalgias.  Skin:  Negative for rash.  Neurological:  Negative for headaches.  Hematological:  Negative for adenopathy.  Psychiatric/Behavioral:  Negative for confusion.       Objective:    BP 100/70   Pulse 77   Temp 98 F (36.7 C) (Oral)   Resp 17   Ht 5\' 6"  (1.676 m)   Wt 135 lb 8 oz (61.5 kg)   SpO2 99%   BMI 21.87 kg/m   BP Readings from Last 3 Encounters:  10/07/22 100/70  09/19/22 (!) 126/93  07/24/22 114/70   Wt Readings from Last 3 Encounters:  10/07/22 135 lb 8 oz (61.5 kg)  09/19/22 135 lb 11.2 oz (61.6 kg)  07/24/22 133 lb (60.3 kg)    Physical Exam Vitals reviewed.  Constitutional:      Appearance: She is well-developed.  Eyes:     Conjunctiva/sclera: Conjunctivae normal.  Cardiovascular:     Rate and Rhythm: Normal rate and regular rhythm.     Pulses: Normal pulses.     Heart sounds: Normal heart sounds.  Pulmonary:     Effort: Pulmonary effort is normal.     Breath sounds: Normal breath sounds. No wheezing, rhonchi or rales.  Skin:    General: Skin is warm and dry.  Neurological:     Mental Status: She is alert.  Psychiatric:        Speech: Speech normal.        Behavior: Behavior normal.        Thought Content: Thought content normal.

## 2022-10-07 NOTE — Assessment & Plan Note (Signed)
Congratulated patient on diligence to exercise.  Deferred breast exam as patient is following with GYN and this has been her preference.  Pap smear and colonoscopy are up-to-date.  Of note, we discussed options for treatment for vasomotor symptoms including Paxil, gabapentin.  We have discussed over-the-counter Unisom to aid in overall sleep.  she will let me know if symptoms affect quality life and she would like to start medication.

## 2022-10-07 NOTE — Patient Instructions (Addendum)
May consider Paxil or gabapentin for hot flashes.   Decent data with black cohosh as well.   May trial over the counter, unisom.   I have included information regarding Pension scheme manager (EAP)  through [employer].   [email or web address]  [email or web address]   EAP phone [email or web address] connect to counselors [phone]  Crisis and Suicide line, available 24 hours per day, 7 days per week [phone]  Virtual appointment scheduling link below:   [email or web address]     Health Maintenance, Female Adopting a healthy lifestyle and getting preventive care are important in promoting health and wellness. Ask your health care provider about: The right schedule for you to have regular tests and exams. Things you can do on your own to prevent diseases and keep yourself healthy. What should I know about diet, weight, and exercise? Eat a healthy diet  Eat a diet that includes plenty of vegetables, fruits, low-fat dairy products, and lean protein. Do not eat a lot of foods that are high in solid fats, added sugars, or sodium. Maintain a healthy weight Body mass index (BMI) is used to identify weight problems. It estimates body fat based on height and weight. Your health care provider can help determine your BMI and help you achieve or maintain a healthy weight. Get regular exercise Get regular exercise. This is one of the most important things you can do for your health. Most adults should: Exercise for at least 150 minutes each week. The exercise should increase your heart rate and make you sweat (moderate-intensity exercise). Do strengthening exercises at least twice a week. This is in addition to the moderate-intensity  exercise. Spend less time sitting. Even light physical activity can be beneficial. Watch cholesterol and blood lipids Have your blood tested for lipids and cholesterol at 47 years of age, then have this test every 5 years. Have your cholesterol levels checked more often if: Your lipid or cholesterol levels are high. You are older than 47 years of age. You are at high risk for heart disease. What should I know about cancer screening? Depending on your health history and family history, you may need to have cancer screening at various ages. This may include screening for: Breast cancer. Cervical cancer. Colorectal cancer. Skin cancer. Lung cancer. What should I know about heart disease, diabetes, and high blood pressure? Blood pressure and heart disease High blood pressure causes heart disease and increases the risk of stroke. This is more likely to develop in people who have high blood pressure readings or are overweight. Have your blood pressure checked: Every 3-5 years if you are 21-65 years of age. Every year if you are 60 years old or older. Diabetes Have regular diabetes screenings. This checks your fasting blood sugar level. Have the screening done: Once every three years after age 65 if you are at a normal weight and have a low risk for diabetes. More often and at a younger age if you are overweight or have a high risk for diabetes. What should I know about preventing infection? Hepatitis B If you have a higher risk for hepatitis B, you should be screened for this virus. Talk with your health care provider to find out if you are at risk for hepatitis B infection. Hepatitis C Testing is recommended for: Everyone born from [birth year] through 1965. Anyone with known risk factors for hepatitis C. Sexually transmitted infections (STIs) Get screened for STIs, including gonorrhea and chlamydia, if: You are sexually active and are younger than 47 years of age.  You are older than 47 years  of age and your health care provider tells you that you are at risk for this type of infection. Your sexual activity has changed since you were last screened, and you are at increased risk for chlamydia or gonorrhea. Ask your health care provider if you are at risk. Ask your health care provider about whether you are at high risk for HIV. Your health care provider may recommend a prescription medicine to help prevent HIV infection. If you choose to take medicine to prevent HIV, you should first get tested for HIV. You should then be tested every 3 months for as long as you are taking the medicine. Pregnancy If you are about to stop having your period (premenopausal) and you may become pregnant, seek counseling before you get pregnant. Take 400 to 800 micrograms (mcg) of folic acid every day if you become pregnant. Ask for birth control (contraception) if you want to prevent pregnancy. Osteoporosis and menopause Osteoporosis is a disease in which the bones lose minerals and strength with aging. This can result in bone fractures. If you are 59 years old or older, or if you are at risk for osteoporosis and fractures, ask your health care provider if you should: Be screened for bone loss. Take a calcium or vitamin D supplement to lower your risk of fractures. Be given hormone replacement therapy (HRT) to treat symptoms of menopause. Follow these instructions at home: Alcohol use Do not drink alcohol if: Your health care provider tells you not to drink. You are pregnant, may be pregnant, or are planning to become pregnant. If you drink alcohol: Limit how much you have to: 0-1 drink a day. Know how much alcohol is in your drink. In the [place] one drink equals one 12 oz bottle of beer (355 mL), one 5 oz glass of wine (148 mL), or one 1 oz glass of hard liquor (44 mL). Lifestyle Do not use any products that contain nicotine or tobacco. These products include cigarettes, chewing tobacco, and vaping  devices, such as e-cigarettes. If you need help quitting, ask your health care provider. Do not use street drugs. Do not share needles. Ask your health care provider for help if you need support or information about quitting drugs. General instructions Schedule regular health, dental, and eye exams. Stay current with your vaccines. Tell your health care provider if: You often feel depressed. You have ever been abused or do not feel safe at home. Summary Adopting a healthy lifestyle and getting preventive care are important in promoting health and wellness. Follow your health care provider's instructions about healthy diet, exercising, and getting tested or screened for diseases. Follow your health care provider's instructions on monitoring your cholesterol and blood pressure. This information is not intended to replace advice given to you by your health care provider. Make sure you discuss any questions you have with your health care provider. Document Revised: 06/18/2020 Document Reviewed: 06/18/2020 Elsevier Patient Education  2024 [employer].

## 2022-10-09 ENCOUNTER — Inpatient Hospital Stay: Payer: 59

## 2022-10-09 ENCOUNTER — Other Ambulatory Visit: Payer: Self-pay | Admitting: *Deleted

## 2022-10-09 DIAGNOSIS — Z170 Estrogen receptor positive status [ER+]: Secondary | ICD-10-CM

## 2022-10-09 DIAGNOSIS — C50311 Malignant neoplasm of lower-inner quadrant of right female breast: Secondary | ICD-10-CM | POA: Diagnosis not present

## 2022-10-09 MED ORDER — SODIUM CHLORIDE 0.9 % IV SOLN
Freq: Once | INTRAVENOUS | Status: AC
  Filled 2022-10-09: qty 250

## 2022-10-09 NOTE — Progress Notes (Signed)
Patient d/c at 1500- post vitals 91/56' HR of 54. Beverage and snack intake. Patient stated that she is feeling better. Discharge instructions reviewed with patient.

## 2022-10-09 NOTE — Progress Notes (Signed)
Per Dr Smith Robert, proceed with phlebotomy of today. Replace with IVF if pt becomes symptomatic  Finished phlebotomy at 1400. After IV taken out, BP noted to be 80/47, pt stated " I don't feel well", loss of consciousness noted briefly. Reclined back and feet propped up. Pt aroused approximately one minute later..blood pressure improved to 131/75. Dr Orlie Dakin in to see pt...will continue to monitor  Pt feeling clammy, still not feeling well, IVF started at 1413 by Ladona Mow RN. Will continue to monitor

## 2022-10-23 ENCOUNTER — Telehealth: Payer: Self-pay | Admitting: Pharmacy Technician

## 2022-10-23 ENCOUNTER — Encounter: Payer: Self-pay | Admitting: Pharmacy Technician

## 2022-10-23 DIAGNOSIS — C50311 Malignant neoplasm of lower-inner quadrant of right female breast: Secondary | ICD-10-CM

## 2022-10-23 NOTE — Telephone Encounter (Signed)
Called patient to set up 1 year follow up for MT3505-A. Patient is available later today 3:30pm to complete.  Crystal Harlow Ohms BS [place] [facility]  Clinical Research Specialist II Oncology Services  Direct Dial: [phone] Fax: [phone]   10/23/2022 9:28 AM

## 2022-10-23 NOTE — Research (Signed)
  MTG-015 - Tissue and Bodily Fluids: Translational Medicine: Discovery and Evaluation of Biomarkers/Pharmacogenomics for the Diagnosis and Personalized Management of Patients    One Year Follow Up Visit:  Research met with the patient in the cancer center for her one year follow up/final appt for the above study. Research reviewed current medical history and medications. Patient has no new medical conditions or cancer diagnosis to report. Current medication list was reviewed with patient for accuracy. The patient was thanked for her time and had no questions for research. A study provided pre loaded [employer] gift card was given to the patient with instructions for use and activation of PIN if desired. Patient is aware that this is her final visit for this clinical trial.     Ivory Broad [place] [facility]  Clinical Research Specialist II Oncology Services  Direct Dial: [phone] Fax: [phone]   10/23/2022 3:52 PM

## 2022-10-28 DIAGNOSIS — H524 Presbyopia: Secondary | ICD-10-CM | POA: Diagnosis not present

## 2022-10-28 DIAGNOSIS — H5213 Myopia, bilateral: Secondary | ICD-10-CM | POA: Diagnosis not present

## 2022-11-14 ENCOUNTER — Other Ambulatory Visit: Payer: Self-pay

## 2022-12-08 ENCOUNTER — Ambulatory Visit: Payer: 59

## 2022-12-09 ENCOUNTER — Other Ambulatory Visit: Payer: Self-pay

## 2022-12-15 ENCOUNTER — Ambulatory Visit: Payer: 59 | Attending: General Surgery | Admitting: Physical Therapy

## 2022-12-15 DIAGNOSIS — Z170 Estrogen receptor positive status [ER+]: Secondary | ICD-10-CM | POA: Insufficient documentation

## 2022-12-15 DIAGNOSIS — C50311 Malignant neoplasm of lower-inner quadrant of right female breast: Secondary | ICD-10-CM | POA: Insufficient documentation

## 2022-12-15 NOTE — Therapy (Signed)
OUTPATIENT PHYSICAL THERAPY SOZO SCREENING NOTE   Patient Name: Adrienne Harris MRN: [id number] DOB:05-07-1975, 47 y.o., female Today's Date: 12/15/2022  PCP: Allegra Grana, FNP REFERRING PROVIDER: Emelia Loron, MD   PT End of Session - 12/15/22 1656     Visit Number 9   unchanged due to screen only   PT Start Time 1650    PT Stop Time 1655    PT Time Calculation (min) 5 min    Activity Tolerance Patient tolerated treatment well    Behavior During Therapy WFL for tasks assessed/performed             Past Medical History:  Diagnosis Date   Atopic dermatitis    Basal cell carcinoma    15 years ago.   BRCA negative 2023   Invitae panel neg at [facility]   Breast cancer in female [facility])    Dysrhythmia    Hemochromatosis    Heterozygous for two genes   Palpitations    a. 04/2013 48h Holter: sinus rhythm/sinus arrhythmia, rare PAC's/PVC's.   PVC (premature ventricular contraction)    Shoulder pain, left 01/24/2015   Shoulder pain, right 01/24/2015   Past Surgical History:  Procedure Laterality Date   BREAST BIOPSY Right 09/25/2021   INVASIVE MAMMARY CARCINOMA,   BREAST LUMPECTOMY Right 10/28/2021   INVASIVE MAMMARY CARCINOMA,   BREAST LUMPECTOMY WITH RADIOACTIVE SEED AND SENTINEL LYMPH NODE BIOPSY Right 10/28/2021   Procedure: RIGHT BREAST LUMPECTOMY WITH RADIOACTIVE SEED AND AXILLARY SENTINEL LYMPH NODE BIOPSY;  Surgeon: Emelia Loron, MD;  Location: [facility];  Service: General;  Laterality: Right;   COLONOSCOPY WITH PROPOFOL N/A 01/24/2021   Procedure: COLONOSCOPY WITH PROPOFOL;  Surgeon: Midge Minium, MD;  Location: [facility];  Service: Endoscopy;  Laterality: N/A;   Excision of basal cell carcinoma     PORT-A-CATH REMOVAL Left 03/13/2022   Procedure: REMOVAL PORT-A-CATH;  Surgeon: Emelia Loron, MD;  Location: [facility];  Service: General;  Laterality: Left;   PORTACATH PLACEMENT Left 11/19/2021    Procedure: INSERTION PORT-A-CATH;  Surgeon: Emelia Loron, MD;  Location: [facility];  Service: General;  Laterality: Left;   WISDOM TOOTH EXTRACTION     Patient Active Problem List   Diagnosis Date Noted   Use of tamoxifen (Nolvadex) 07/24/2022   Lymphedema 07/17/2022   Genetic testing 10/24/2021   Malignant neoplasm of lower-inner quadrant of right breast of female, estrogen receptor positive (HCC) 10/01/2021   Colon cancer screening    Intractable menstrual migraine without status migrainosus 07/30/2020   Nonallopathic lesion of sacral region 12/30/2017   Trigger point of left shoulder region 10/06/2017   Slipped rib syndrome 08/05/2017   Nonallopathic lesion of rib cage 08/05/2017   Nonallopathic lesion of cervical region 08/05/2017   Nonallopathic lesion of thoracic region 08/05/2017   Nonallopathic lesion of lumbosacral region 08/05/2017   Chronic left shoulder pain 07/10/2017   Chronic LLQ pain 05/17/2017   Atopic dermatitis 07/10/2016   Hepatic cyst 07/10/2016   Routine physical examination 07/10/2016   PVC's (premature ventricular contractions) 04/22/2013   Hemochromatosis 04/22/2013    REFERRING DIAG: right breast cancer at risk for lymphedema  THERAPY DIAG:  Malignant neoplasm of lower-inner quadrant of right breast of female, estrogen receptor positive (HCC)  PERTINENT HISTORY:  Patient was diagnosed on 09/25/2021 with right grade 3 invasive mammary carcinoma of no particular type. She underwent a right lumpectomy and sentinel node biopsy on 09/28/2021. It is ER/PR positive and HER2  negative and the Ki67 was not measured.    PRECAUTIONS: right UE Lymphedema risk  SUBJECTIVE: Here for SOZO screen  PAIN:  Are you having pain? Yes: NPRS scale: 3/10 Pain location: R axilla Pain description: tightness Aggravating factors: nothing Relieving factors: nothing  SOZO SCREENING: Patient was assessed today using the SOZO machine to determine the  lymphedema index score. This was compared to her baseline score. It was determined that she is within the recommended range when compared to her baseline and no further action is needed at this time. She will continue SOZO screenings. These are done every 3 months for 2 years post operatively followed by every 6 months for 2 years, and then annually.    Bethann Punches, [place] 12/15/22 4:57 PM

## 2022-12-23 ENCOUNTER — Other Ambulatory Visit: Payer: Self-pay | Admitting: *Deleted

## 2022-12-23 DIAGNOSIS — C50311 Malignant neoplasm of lower-inner quadrant of right female breast: Secondary | ICD-10-CM

## 2022-12-24 ENCOUNTER — Inpatient Hospital Stay: Payer: 59 | Attending: Oncology

## 2022-12-24 DIAGNOSIS — Z923 Personal history of irradiation: Secondary | ICD-10-CM | POA: Diagnosis not present

## 2022-12-24 DIAGNOSIS — Z170 Estrogen receptor positive status [ER+]: Secondary | ICD-10-CM | POA: Insufficient documentation

## 2022-12-24 DIAGNOSIS — Z9221 Personal history of antineoplastic chemotherapy: Secondary | ICD-10-CM | POA: Insufficient documentation

## 2022-12-24 DIAGNOSIS — C50311 Malignant neoplasm of lower-inner quadrant of right female breast: Secondary | ICD-10-CM

## 2022-12-24 DIAGNOSIS — Z79810 Long term (current) use of selective estrogen receptor modulators (SERMs): Secondary | ICD-10-CM | POA: Diagnosis not present

## 2022-12-24 LAB — COMPREHENSIVE METABOLIC PANEL
ALT: 14 U/L (ref 0–44)
AST: 18 U/L (ref 15–41)
Albumin: 3.7 g/dL (ref 3.5–5.0)
Alkaline Phosphatase: 36 U/L — ABNORMAL LOW (ref 38–126)
Anion gap: 6 (ref 5–15)
BUN: 16 mg/dL (ref 6–20)
CO2: 30 mmol/L (ref 22–32)
Calcium: 9.2 mg/dL (ref 8.9–10.3)
Chloride: 104 mmol/L (ref 98–111)
Creatinine, Ser: 0.56 mg/dL (ref 0.44–1.00)
GFR, Estimated: >60 mL/min (ref >60)
Glucose, Bld: 86 mg/dL (ref 70–99)
Potassium: 3.8 mmol/L (ref 3.5–5.1)
Sodium: 140 mmol/L (ref 135–145)
Total Bilirubin: 0.4 mg/dL (ref <1.2)
Total Protein: 6.2 g/dL — ABNORMAL LOW (ref 6.5–8.1)

## 2022-12-24 LAB — CBC
HCT: 38.4 % (ref 36.0–46.0)
Hemoglobin: 12.9 g/dL (ref 12.0–15.0)
MCH: 33.2 pg (ref 26.0–34.0)
MCHC: 33.6 g/dL (ref 30.0–36.0)
MCV: 98.7 fL (ref 80.0–100.0)
Platelets: 199 10*3/uL (ref 150–400)
RBC: 3.89 MIL/uL (ref 3.87–5.11)
RDW: 11.8 % (ref 11.5–15.5)
WBC: 3.9 10*3/uL — ABNORMAL LOW (ref 4.0–10.5)
nRBC: 0 % (ref 0.0–0.2)

## 2022-12-24 LAB — FERRITIN: Ferritin: 73 ng/mL (ref 11–307)

## 2022-12-25 ENCOUNTER — Encounter: Payer: Self-pay | Admitting: *Deleted

## 2023-02-18 ENCOUNTER — Other Ambulatory Visit: Payer: Self-pay

## 2023-03-05 ENCOUNTER — Other Ambulatory Visit: Payer: Self-pay

## 2023-03-06 ENCOUNTER — Other Ambulatory Visit: Payer: Self-pay

## 2023-03-08 ENCOUNTER — Other Ambulatory Visit: Payer: Self-pay

## 2023-03-09 ENCOUNTER — Other Ambulatory Visit: Payer: Self-pay

## 2023-03-09 MED ORDER — TACROLIMUS 0.1 % EX OINT
TOPICAL_OINTMENT | Freq: Two times a day (BID) | CUTANEOUS | 0 refills | Status: AC
  Filled 2023-03-09: qty 60, 30d supply, fill #0

## 2023-03-10 ENCOUNTER — Other Ambulatory Visit: Payer: Self-pay

## 2023-03-20 ENCOUNTER — Other Ambulatory Visit: Payer: 59

## 2023-03-20 ENCOUNTER — Ambulatory Visit: Payer: 59

## 2023-03-20 ENCOUNTER — Ambulatory Visit: Payer: 59 | Admitting: Oncology

## 2023-03-23 ENCOUNTER — Other Ambulatory Visit: Payer: Self-pay | Admitting: *Deleted

## 2023-03-23 ENCOUNTER — Ambulatory Visit: Payer: 59 | Attending: General Surgery

## 2023-03-23 VITALS — Wt 141.0 lb

## 2023-03-23 DIAGNOSIS — Z170 Estrogen receptor positive status [ER+]: Secondary | ICD-10-CM

## 2023-03-23 DIAGNOSIS — Z483 Aftercare following surgery for neoplasm: Secondary | ICD-10-CM | POA: Insufficient documentation

## 2023-03-23 NOTE — Therapy (Signed)
 OUTPATIENT PHYSICAL THERAPY SOZO SCREENING NOTE   Patient Name: Adrienne Harris MRN: [id number] DOB:Dec 04, 1975, 48 y.o., female Today's Date: 03/23/2023  PCP: Calista Catching, FNP REFERRING PROVIDER: Enid Harry, MD   PT End of Session - 03/23/23 1649     Visit Number 9   # unchanged due to screen only   PT Start Time 1647    PT Stop Time 1651    PT Time Calculation (min) 4 min    Activity Tolerance Patient tolerated treatment well    Behavior During Therapy WFL for tasks assessed/performed             Past Medical History:  Diagnosis Date   Atopic dermatitis    Basal cell carcinoma    15 years ago.   BRCA negative 2023   Invitae panel neg at [facility]   Breast cancer in female [facility])    Dysrhythmia    Hemochromatosis    Heterozygous for two genes   Palpitations    a. 04/2013 48h Holter: sinus rhythm/sinus arrhythmia, rare PAC's/PVC's.   PVC (premature ventricular contraction)    Shoulder pain, left 01/24/2015   Shoulder pain, right 01/24/2015   Past Surgical History:  Procedure Laterality Date   BREAST BIOPSY Right 09/25/2021   INVASIVE MAMMARY CARCINOMA,   BREAST LUMPECTOMY Right 10/28/2021   INVASIVE MAMMARY CARCINOMA,   BREAST LUMPECTOMY WITH RADIOACTIVE SEED AND SENTINEL LYMPH NODE BIOPSY Right 10/28/2021   Procedure: RIGHT BREAST LUMPECTOMY WITH RADIOACTIVE SEED AND AXILLARY SENTINEL LYMPH NODE BIOPSY;  Surgeon: Enid Harry, MD;  Location: [facility];  Service: General;  Laterality: Right;   COLONOSCOPY WITH PROPOFOL  N/A 01/24/2021   Procedure: COLONOSCOPY WITH PROPOFOL ;  Surgeon: Marnee Sink, MD;  Location: [facility];  Service: Endoscopy;  Laterality: N/A;   Excision of basal cell carcinoma     PORT-A-CATH REMOVAL Left 03/13/2022   Procedure: REMOVAL PORT-A-CATH;  Surgeon: Enid Harry, MD;  Location: [facility];  Service: General;  Laterality: Left;   PORTACATH PLACEMENT Left 11/19/2021    Procedure: INSERTION PORT-A-CATH;  Surgeon: Enid Harry, MD;  Location: [facility];  Service: General;  Laterality: Left;   WISDOM TOOTH EXTRACTION     Patient Active Problem List   Diagnosis Date Noted   Use of tamoxifen  (Nolvadex ) 07/24/2022   Lymphedema 07/17/2022   Genetic testing 10/24/2021   Malignant neoplasm of lower-inner quadrant of right breast of female, estrogen receptor positive (HCC) 10/01/2021   Colon cancer screening    Intractable menstrual migraine without status migrainosus 07/30/2020   Nonallopathic lesion of sacral region 12/30/2017   Trigger point of left shoulder region 10/06/2017   Slipped rib syndrome 08/05/2017   Nonallopathic lesion of rib cage 08/05/2017   Nonallopathic lesion of cervical region 08/05/2017   Nonallopathic lesion of thoracic region 08/05/2017   Nonallopathic lesion of lumbosacral region 08/05/2017   Chronic left shoulder pain 07/10/2017   Chronic LLQ pain 05/17/2017   Atopic dermatitis 07/10/2016   Hepatic cyst 07/10/2016   Routine physical examination 07/10/2016   PVC's (premature ventricular contractions) 04/22/2013   Hemochromatosis 04/22/2013    REFERRING DIAG: right breast cancer at risk for lymphedema  THERAPY DIAG:  Aftercare following surgery for neoplasm  PERTINENT HISTORY:  Patient was diagnosed on 09/25/2021 with right grade 3 invasive mammary carcinoma of no particular type. She underwent a right lumpectomy and sentinel node biopsy on 09/28/2021. It is ER/PR positive and HER2 negative and the Ki67 was not measured.  PRECAUTIONS: right UE Lymphedema risk  SUBJECTIVE: Pt returns for her 3 month L-Dex screen. "I wear my sleeve when I exercise and do house chores, any time I'm being really busy."  PAIN:  No  SOZO SCREENING: Patient was assessed today using the SOZO machine to determine the lymphedema index score. This was compared to her baseline score. It was determined that she is within the  recommended range when compared to her baseline and no further action is needed at this time. She will continue SOZO screenings. These are done every 3 months for 2 years post operatively followed by every 6 months for 2 years, and then annually.   L-DEX FLOWSHEETS - 03/23/23 1600       L-DEX LYMPHEDEMA SCREENING   Measurement Type Unilateral    L-DEX MEASUREMENT EXTREMITY Upper Extremity    POSITION  Standing    DOMINANT SIDE Right    At Risk Side Right    BASELINE SCORE (UNILATERAL) -6.4    L-DEX SCORE (UNILATERAL) -2.5    VALUE CHANGE (UNILAT) 3.9             Roslynn Coombes, PTA 03/23/23 4:54 PM

## 2023-03-24 ENCOUNTER — Ambulatory Visit: Payer: 59

## 2023-03-24 ENCOUNTER — Inpatient Hospital Stay: Payer: 59 | Attending: Oncology

## 2023-03-24 ENCOUNTER — Inpatient Hospital Stay (HOSPITAL_BASED_OUTPATIENT_CLINIC_OR_DEPARTMENT_OTHER): Payer: 59 | Admitting: Oncology

## 2023-03-24 ENCOUNTER — Inpatient Hospital Stay: Payer: 59

## 2023-03-24 ENCOUNTER — Encounter: Payer: Self-pay | Admitting: Oncology

## 2023-03-24 ENCOUNTER — Other Ambulatory Visit: Payer: Self-pay

## 2023-03-24 VITALS — BP 127/79 | HR 69 | Temp 97.9°F | Resp 20 | Wt 141.8 lb

## 2023-03-24 DIAGNOSIS — M8580 Other specified disorders of bone density and structure, unspecified site: Secondary | ICD-10-CM | POA: Diagnosis not present

## 2023-03-24 DIAGNOSIS — C50311 Malignant neoplasm of lower-inner quadrant of right female breast: Secondary | ICD-10-CM | POA: Diagnosis not present

## 2023-03-24 DIAGNOSIS — Z1721 Progesterone receptor positive status: Secondary | ICD-10-CM | POA: Insufficient documentation

## 2023-03-24 DIAGNOSIS — Z08 Encounter for follow-up examination after completed treatment for malignant neoplasm: Secondary | ICD-10-CM

## 2023-03-24 DIAGNOSIS — Z79810 Long term (current) use of selective estrogen receptor modulators (SERMs): Secondary | ICD-10-CM | POA: Diagnosis not present

## 2023-03-24 DIAGNOSIS — Z170 Estrogen receptor positive status [ER+]: Secondary | ICD-10-CM

## 2023-03-24 DIAGNOSIS — Z5181 Encounter for therapeutic drug level monitoring: Secondary | ICD-10-CM

## 2023-03-24 DIAGNOSIS — Z1732 Human epidermal growth factor receptor 2 negative status: Secondary | ICD-10-CM | POA: Diagnosis not present

## 2023-03-24 DIAGNOSIS — Z923 Personal history of irradiation: Secondary | ICD-10-CM | POA: Insufficient documentation

## 2023-03-24 DIAGNOSIS — Z79899 Other long term (current) drug therapy: Secondary | ICD-10-CM

## 2023-03-24 LAB — COMPREHENSIVE METABOLIC PANEL
ALT: 13 U/L (ref 0–44)
AST: 17 U/L (ref 15–41)
Albumin: 3.8 g/dL (ref 3.5–5.0)
Alkaline Phosphatase: 44 U/L (ref 38–126)
Anion gap: 7 (ref 5–15)
BUN: 19 mg/dL (ref 6–20)
CO2: 30 mmol/L (ref 22–32)
Calcium: 9.2 mg/dL (ref 8.9–10.3)
Chloride: 105 mmol/L (ref 98–111)
Creatinine, Ser: 0.59 mg/dL (ref 0.44–1.00)
GFR, Estimated: >60 mL/min (ref >60)
Glucose, Bld: 67 mg/dL — ABNORMAL LOW (ref 70–99)
Potassium: 3.8 mmol/L (ref 3.5–5.1)
Sodium: 142 mmol/L (ref 135–145)
Total Bilirubin: 0.4 mg/dL (ref 0.0–1.2)
Total Protein: 6.6 g/dL (ref 6.5–8.1)

## 2023-03-24 LAB — CBC WITH DIFFERENTIAL/PLATELET
Abs Immature Granulocytes: 0.01 10*3/uL (ref 0.00–0.07)
Basophils Absolute: 0 10*3/uL (ref 0.0–0.1)
Basophils Relative: 1 %
Eosinophils Absolute: 0 10*3/uL (ref 0.0–0.5)
Eosinophils Relative: 1 %
HCT: 39.8 % (ref 36.0–46.0)
Hemoglobin: 13.3 g/dL (ref 12.0–15.0)
Immature Granulocytes: 0 %
Lymphocytes Relative: 31 %
Lymphs Abs: 1.5 10*3/uL (ref 0.7–4.0)
MCH: 33 pg (ref 26.0–34.0)
MCHC: 33.4 g/dL (ref 30.0–36.0)
MCV: 98.8 fL (ref 80.0–100.0)
Monocytes Absolute: 0.5 10*3/uL (ref 0.1–1.0)
Monocytes Relative: 10 %
Neutro Abs: 2.8 10*3/uL (ref 1.7–7.7)
Neutrophils Relative %: 57 %
Platelets: 203 10*3/uL (ref 150–400)
RBC: 4.03 MIL/uL (ref 3.87–5.11)
RDW: 12.1 % (ref 11.5–15.5)
WBC: 4.9 10*3/uL (ref 4.0–10.5)
nRBC: 0 % (ref 0.0–0.2)

## 2023-03-24 LAB — FERRITIN: Ferritin: 65 ng/mL (ref 11–307)

## 2023-03-24 MED ORDER — SODIUM CHLORIDE 0.9 % IV SOLN
Freq: Once | INTRAVENOUS | Status: AC
  Filled 2023-03-24: qty 250

## 2023-03-24 MED ORDER — SODIUM CHLORIDE 0.9% FLUSH
10.0000 mL | Freq: Once | INTRAVENOUS | Status: DC | PRN
Start: 2023-03-24 — End: 2023-03-24
  Filled 2023-03-24: qty 10

## 2023-03-24 MED ORDER — ZOLEDRONIC ACID 4 MG/100ML IV SOLN
4.0000 mg | INTRAVENOUS | Status: DC
  Administered 2023-03-24: 4 mg via INTRAVENOUS
  Filled 2023-03-24: qty 100

## 2023-03-24 NOTE — Patient Instructions (Signed)

## 2023-03-25 ENCOUNTER — Other Ambulatory Visit: Payer: Self-pay

## 2023-03-25 ENCOUNTER — Other Ambulatory Visit: Payer: Self-pay | Admitting: Oncology

## 2023-03-25 LAB — FSH/LH
FSH: 25.9 m[IU]/mL
LH: 16.3 m[IU]/mL

## 2023-03-25 LAB — ESTRADIOL: Estradiol: <5 pg/mL

## 2023-03-25 MED ORDER — LETROZOLE 2.5 MG PO TABS
2.5000 mg | ORAL_TABLET | Freq: Every day | ORAL | 3 refills | Status: DC
  Filled 2023-03-25: qty 30, 30d supply, fill #0
  Filled 2023-04-18: qty 30, 30d supply, fill #1
  Filled 2023-05-19: qty 30, 30d supply, fill #2
  Filled 2023-06-18: qty 30, 30d supply, fill #3

## 2023-03-27 LAB — INHIBIN B: Inhibin B: <7 pg/mL

## 2023-03-29 ENCOUNTER — Encounter: Payer: Self-pay | Admitting: Oncology

## 2023-03-29 NOTE — Progress Notes (Signed)
 Hematology/Oncology Consult note [facility]  [phone] [phone]  Patient Care Team: Allegra Grana, FNP as PCP - General (Family Medicine) Hulen Luster, RN as Oncology Nurse Navigator Creig Hines, MD as Consulting Physician (Oncology) Emelia Loron, MD as Consulting Physician (General Surgery) Lonie Peak, MD as Attending Physician (Radiation Oncology)   Name of the patient: Adrienne [facility]  [id number]  February 03, 1976   Date of visit: 03/29/23  Diagnosis-  pathological prognostic stage Ia invasive mammary carcinoma of the right breast ER/PR positive HER2 negative     Chief complaint/ Reason for visit-follow-up of breast cancer  Heme/Onc history:  patient is a 48 year old female who recently underwent aLateral screening mammogram which showed a possible area of concern in the right breast.  This was followed by diagnostic mammogram and ultrasound which showed a 7 mm mass in the right lower breast.  No suspicious right axillary adenopathy.  Biopsy confirmed invasive mammary carcinoma 6 mm grade 3 ER 51 to 90% positive PR 51 to 90% positive and HER2 negative.     Oncotype score came back at 23 with an absolute chemotherapy benefit of about 6.5%.  Adjuvant TC chemotherapy was recommended.  4 cycles of chemotherapy were completed on 01/29/2022.  Patient is currently undergoing adjuvant radiation therapy.  Patient has history of compound heterozygosity for C282Y and H63D and was diagnosed with hereditary hemochromatosis in 2009 requiring multiple phlebotomies.  She does not have any baseline history of cirrhosis.  Interval history-she is doing well on tamoxifen.  Denies any breast concerns.  She has not had any menstrual cycles for over a year.  ECOG PS- 0 Pain scale- 0   Review of systems- Review of Systems  Constitutional:  Negative for chills, fever, malaise/fatigue and weight loss.  HENT:  Negative for congestion, ear discharge  and nosebleeds.   Eyes:  Negative for blurred vision.  Respiratory:  Negative for cough, hemoptysis, sputum production, shortness of breath and wheezing.   Cardiovascular:  Negative for chest pain, palpitations, orthopnea and claudication.  Gastrointestinal:  Negative for abdominal pain, blood in stool, constipation, diarrhea, heartburn, melena, nausea and vomiting.  Genitourinary:  Negative for dysuria, flank pain, frequency, hematuria and urgency.  Musculoskeletal:  Negative for back pain, joint pain and myalgias.  Skin:  Negative for rash.  Neurological:  Negative for dizziness, tingling, focal weakness, seizures, weakness and headaches.  Endo/Heme/Allergies:  Does not bruise/bleed easily.  Psychiatric/Behavioral:  Negative for depression and suicidal ideas. The patient does not have insomnia.       Allergies  Allergen Reactions   Propylene Glycol     Skin rash   Hydrocortisone Rash    Allergic to all topical steriods Allergic to all topical corticosteriods   Other Rash    Topical steroid      Past Medical History:  Diagnosis Date   Atopic dermatitis    Basal cell carcinoma    15 years ago.   BRCA negative 2023   Invitae panel neg at [facility]   Breast cancer in female [facility])    Dysrhythmia    Hemochromatosis    Heterozygous for two genes   Palpitations    a. 04/2013 48h Holter: sinus rhythm/sinus arrhythmia, rare PAC's/PVC's.   PVC (premature ventricular contraction)    Shoulder pain, left 01/24/2015   Shoulder pain, right 01/24/2015     Past Surgical History:  Procedure Laterality Date   BREAST BIOPSY Right 09/25/2021   INVASIVE MAMMARY CARCINOMA,  BREAST LUMPECTOMY Right 10/28/2021   INVASIVE MAMMARY CARCINOMA,   BREAST LUMPECTOMY WITH RADIOACTIVE SEED AND SENTINEL LYMPH NODE BIOPSY Right 10/28/2021   Procedure: RIGHT BREAST LUMPECTOMY WITH RADIOACTIVE SEED AND AXILLARY SENTINEL LYMPH NODE BIOPSY;  Surgeon: Emelia Loron, MD;  Location: [facility];  Service: General;  Laterality: Right;   COLONOSCOPY WITH PROPOFOL N/A 01/24/2021   Procedure: COLONOSCOPY WITH PROPOFOL;  Surgeon: Midge Minium, MD;  Location: [facility];  Service: Endoscopy;  Laterality: N/A;   Excision of basal cell carcinoma     PORT-A-CATH REMOVAL Left 03/13/2022   Procedure: REMOVAL PORT-A-CATH;  Surgeon: Emelia Loron, MD;  Location: [facility];  Service: General;  Laterality: Left;   PORTACATH PLACEMENT Left 11/19/2021   Procedure: INSERTION PORT-A-CATH;  Surgeon: Emelia Loron, MD;  Location: [facility];  Service: General;  Laterality: Left;   WISDOM TOOTH EXTRACTION      Social History   Socioeconomic History   Marital status: Married    Spouse name: Not on file   Number of children: 1   Years of education: Not on file   Highest education level: Not on file  Occupational History   Occupation: Dietitian  Tobacco Use   Smoking status: Never   Smokeless tobacco: Never  Vaping Use   Vaping status: Never Used  Substance and Sexual Activity   Alcohol use: Not Currently    Alcohol/week: 2.0 standard drinks of alcohol    Types: 2 Cans of beer per week    Comment: 2-4 beer/wine aweek.   Drug use: No   Sexual activity: Not Currently    Partners: Male    Birth control/protection: None  Other Topics Concern   Not on file  Social History Narrative   Married      Daughter -16 yrs.       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[employer]   Social Drivers of Health   Financial Resource Strain: Not on file  Food Insecurity: Not on file  Transportation Needs: Not on file  Physical Activity: Inactive (05/15/2017)   Exercise Vital Sign    Days of Exercise per Week: 0 days    Minutes of Exercise per Session: 0 min  Stress: No Stress Concern Present (05/15/2017)   [employer] of Occupational Health - Occupational Stress Questionnaire    Feeling of Stress : Only a little  Social Connections: Not on file   Intimate Partner Violence: Not on file    Family History  Problem Relation Age of Onset   Hemochromatosis Mother    Basal cell carcinoma Mother    Hypertension Father    Melanoma Father        multiple removed   Colon polyps Father 50       adenomatous   Breast cancer Paternal Aunt 90   Kidney cancer Paternal Aunt    Alzheimer's disease Paternal Aunt    Hyperlipidemia Maternal Grandmother    Hypertension Maternal Grandmother    Osteoporosis Maternal Grandmother    Hyperlipidemia Maternal Grandfather    Hypertension Maternal Grandfather    Diabetes Maternal Grandfather    Stroke Maternal Grandfather    Alzheimer's disease Paternal Grandmother    Breast cancer Other        late years   Breast cancer Other        late years   Colon cancer Neg Hx      Current Outpatient Medications:    Calcium Carb-Cholecalciferol (CALCIUM + VITAMIN D3 PO), Take by  mouth. Calcium 600mg  and vit d 400 units take 2 once a day, Disp: , Rfl:    cholecalciferol (VITAMIN D) 1000 units tablet, Take 1,000 Units by mouth daily., Disp: , Rfl:    Cyanocobalamin (VITAMIN B 12 PO), Take 1,000 mcg by mouth daily., Disp: , Rfl:    letrozole (FEMARA) 2.5 MG tablet, Take 1 tablet (2.5 mg total) by mouth daily., Disp: 30 tablet, Rfl: 3   PARAGARD INTRAUTERINE COPPER IU, 1 each by Intrauterine route once., Disp: , Rfl:    propranolol (INDERAL) 20 MG tablet, Take 1 tablet (20 mg total) by mouth daily., Disp: 90 tablet, Rfl: 3   tacrolimus (PROTOPIC) 0.1 % ointment, Apply to affected areas twice daily as needed., Disp: 60 g, Rfl: 0 No current facility-administered medications for this visit.  Facility-Administered Medications Ordered in Other Visits:    goserelin (ZOLADEX) injection 3.6 mg, 3.6 mg, Subcutaneous, Q28 days, Creig Hines, MD  Physical exam:  Vitals:   03/24/23 1506  BP: 127/79  Pulse: 69  Resp: 20  Temp: 97.9 F (36.6 C)  SpO2: 100%  Weight: 141 lb 12.8 oz (64.3 kg)   Physical  Exam Cardiovascular:     Rate and Rhythm: Normal rate and regular rhythm.     Heart sounds: Normal heart sounds.  Pulmonary:     Effort: Pulmonary effort is normal.     Breath sounds: Normal breath sounds.  Skin:    General: Skin is warm and dry.  Neurological:     Mental Status: She is alert and oriented to person, place, and time.   Breast exam was performed in seated and lying down position. Patient is status post right lumpectomy with a well-healed surgical scar. No evidence of any palpable masses. No evidence of axillary adenopathy. No evidence of any palpable masses or lumps in the left breast. No evidence of leftt axillary adenopathy       Latest Ref Rng & Units 03/24/2023    9:11 AM  CMP  Glucose 70 - 99 mg/dL 67   BUN 6 - 20 mg/dL 19   Creatinine 1.61 - 1.00 mg/dL 0.96   Sodium 045 - 409 mmol/L 142   Potassium 3.5 - 5.1 mmol/L 3.8   Chloride 98 - 111 mmol/L 105   CO2 22 - 32 mmol/L 30   Calcium 8.9 - 10.3 mg/dL 9.2   Total Protein 6.5 - 8.1 g/dL 6.6   Total Bilirubin 0.0 - 1.2 mg/dL 0.4   Alkaline Phos 38 - 126 U/L 44   AST 15 - 41 U/L 17   ALT 0 - 44 U/L 13       Latest Ref Rng & Units 03/24/2023    9:11 AM  CBC  WBC 4.0 - 10.5 K/uL 4.9   Hemoglobin 12.0 - 15.0 g/dL 81.1   Hematocrit 91.4 - 46.0 % 39.8   Platelets 150 - 400 K/uL 203     Assessment and plan- Patient is a 48 y.o. female who is here for for following issues:  History of stage I right breast cancer in 2023: S/p lumpectomy adjuvant chemotherapy and radiation presently on tamoxifen.  Her hormone levels are consistent with postmenopausal range and therefore I am switching her from tamoxifen to letrozole at this time. Discussed risks and  benefits of letrozole including all but not limited to hot flashes mood swings arthralgias and worsening bone health.  She had a bone density scan in December 2023 which showed osteopenia but her 10-year probability of  a major osteoporotic fracture was less than 20% and  hip fracture less than 3%.  I will plan to repeat her bone density scan in December of this year.  With regards to territory hemochromatosis she has compound heterozygosity for C282Y and H63D.  This was diagnosed back in 2015 and it is unclear if she ever had clinical iron overload at that time.  Presently patient is asymptomatic with normal LFTs.  Her last MRI abdomen in 2018 did not show any evidence of cirrhosis.  She is known to have a benign hepatic adenoma which has remained stable over the years.  Patient nearly passed out when she had a phlebotomy last time.  I will plan to repeat CBC CMP and ferritin levels in 6 months  Patient has been receiving adjuvant Zometa for her breast cancer and this is dose 3 out of 6 today.  She will receive total 6 doses every 6 months   Visit Diagnosis 1. Encounter for follow-up surveillance of breast cancer   2. Hereditary hemochromatosis (HCC)   3. Encounter for monitoring tamoxifen therapy   4. High risk medication use   5. Encounter for monitoring zoledronate therapy      Dr. Owens Shark, MD, MPH [facility] [phone] 03/29/2023 8:27 PM

## 2023-05-07 DIAGNOSIS — D2262 Melanocytic nevi of left upper limb, including shoulder: Secondary | ICD-10-CM | POA: Diagnosis not present

## 2023-05-07 DIAGNOSIS — D2271 Melanocytic nevi of right lower limb, including hip: Secondary | ICD-10-CM | POA: Diagnosis not present

## 2023-05-07 DIAGNOSIS — C44319 Basal cell carcinoma of skin of other parts of face: Secondary | ICD-10-CM | POA: Diagnosis not present

## 2023-05-07 DIAGNOSIS — D485 Neoplasm of uncertain behavior of skin: Secondary | ICD-10-CM | POA: Diagnosis not present

## 2023-05-07 DIAGNOSIS — D2272 Melanocytic nevi of left lower limb, including hip: Secondary | ICD-10-CM | POA: Diagnosis not present

## 2023-05-07 DIAGNOSIS — D225 Melanocytic nevi of trunk: Secondary | ICD-10-CM | POA: Diagnosis not present

## 2023-05-07 DIAGNOSIS — D2261 Melanocytic nevi of right upper limb, including shoulder: Secondary | ICD-10-CM | POA: Diagnosis not present

## 2023-05-07 DIAGNOSIS — L821 Other seborrheic keratosis: Secondary | ICD-10-CM | POA: Diagnosis not present

## 2023-06-03 DIAGNOSIS — Z170 Estrogen receptor positive status [ER+]: Secondary | ICD-10-CM | POA: Diagnosis not present

## 2023-06-03 DIAGNOSIS — C50311 Malignant neoplasm of lower-inner quadrant of right female breast: Secondary | ICD-10-CM | POA: Diagnosis not present

## 2023-06-29 ENCOUNTER — Ambulatory Visit: Payer: 59 | Attending: General Surgery

## 2023-06-29 VITALS — Wt 139.2 lb

## 2023-06-29 DIAGNOSIS — Z483 Aftercare following surgery for neoplasm: Secondary | ICD-10-CM | POA: Insufficient documentation

## 2023-06-29 NOTE — Therapy (Signed)
 OUTPATIENT PHYSICAL THERAPY SOZO SCREENING NOTE   Patient Name: Adrienne Harris MRN: [id number] DOB:11/12/1975, 48 y.o., female Today's Date: 06/29/2023  PCP: Adrienne Catching, FNP REFERRING PROVIDER: Enid Harry, MD   PT End of Session - 06/29/23 0809     Visit Number 9   # unchnaged due to screen only   PT Start Time 0807    PT Stop Time 0811    PT Time Calculation (min) 4 min    Activity Tolerance Patient tolerated treatment well    Behavior During Therapy Adrienne [facility] for tasks assessed/performed             Past Medical History:  Diagnosis Date   Atopic dermatitis    Basal cell carcinoma    15 years ago.   BRCA negative 2023   Invitae panel neg at [facility]   Breast cancer in female [facility])    Dysrhythmia    Hemochromatosis    Heterozygous for two genes   Palpitations    a. 04/2013 48h Holter: sinus rhythm/sinus arrhythmia, rare PAC's/PVC's.   PVC (premature ventricular contraction)    Shoulder pain, left 01/24/2015   Shoulder pain, right 01/24/2015   Past Surgical History:  Procedure Laterality Date   BREAST BIOPSY Right 09/25/2021   INVASIVE MAMMARY CARCINOMA,   BREAST LUMPECTOMY Right 10/28/2021   INVASIVE MAMMARY CARCINOMA,   BREAST LUMPECTOMY WITH RADIOACTIVE SEED AND SENTINEL LYMPH NODE BIOPSY Right 10/28/2021   Procedure: RIGHT BREAST LUMPECTOMY WITH RADIOACTIVE SEED AND AXILLARY SENTINEL LYMPH NODE BIOPSY;  Surgeon: Adrienne Harry, MD;  Location: [facility];  Service: General;  Laterality: Right;   COLONOSCOPY WITH PROPOFOL  N/A 01/24/2021   Procedure: COLONOSCOPY WITH PROPOFOL ;  Surgeon: Adrienne Sink, MD;  Location: [facility];  Service: [facility];  Laterality: N/A;   Excision of basal cell carcinoma     PORT-A-CATH REMOVAL Left 03/13/2022   Procedure: REMOVAL PORT-A-CATH;  Surgeon: Adrienne Harry, MD;  Location: [facility];  Service: General;  Laterality: Left;   PORTACATH PLACEMENT Left 11/19/2021    Procedure: INSERTION PORT-A-CATH;  Surgeon: Adrienne Harry, MD;  Location: [facility];  Service: General;  Laterality: Left;   WISDOM TOOTH EXTRACTION     Patient Active Problem List   Diagnosis Date Noted   Use of tamoxifen  (Nolvadex ) 07/24/2022   Lymphedema 07/17/2022   Genetic testing 10/24/2021   Malignant neoplasm of lower-inner quadrant of right breast of female, estrogen receptor positive (HCC) 10/01/2021   Colon cancer screening    Intractable menstrual migraine without status migrainosus 07/30/2020   Nonallopathic lesion of sacral region 12/30/2017   Trigger point of left shoulder region 10/06/2017   Slipped rib syndrome 08/05/2017   Nonallopathic lesion of rib cage 08/05/2017   Nonallopathic lesion of cervical region 08/05/2017   Nonallopathic lesion of thoracic region 08/05/2017   Nonallopathic lesion of lumbosacral region 08/05/2017   Chronic left shoulder pain 07/10/2017   Chronic LLQ pain 05/17/2017   Atopic dermatitis 07/10/2016   Hepatic cyst 07/10/2016   Routine physical examination 07/10/2016   PVC's (premature ventricular contractions) 04/22/2013   Hemochromatosis 04/22/2013    REFERRING DIAG: right breast cancer at risk for lymphedema  THERAPY DIAG:  Aftercare following surgery for neoplasm  PERTINENT HISTORY:  Patient was diagnosed on 09/25/2021 with right grade 3 invasive mammary carcinoma of no particular type. She underwent a right lumpectomy and sentinel node biopsy on 09/28/2021. It is ER/PR positive and HER2 negative and the Ki67 was not measured.  PRECAUTIONS: right UE Lymphedema risk  SUBJECTIVE: Pt returns for her 3 month L-Dex screen.   PAIN:  No  SOZO SCREENING: Patient was assessed today using the SOZO machine to determine the lymphedema index score. This was compared to her baseline score. It was determined that she is within the recommended range when compared to her baseline and no further action is needed at this time.  She will continue SOZO screenings. These are done every 3 months for 2 years post operatively followed by every 6 months for 2 years, and then annually.   L-DEX FLOWSHEETS - 06/29/23 0800       L-DEX LYMPHEDEMA SCREENING   Measurement Type Unilateral    L-DEX MEASUREMENT EXTREMITY Upper Extremity    POSITION  Standing    DOMINANT SIDE Right    At Risk Side Right    BASELINE SCORE (UNILATERAL) -6.4    L-DEX SCORE (UNILATERAL) -4.4    VALUE CHANGE (UNILAT) 2             Roslynn Coombes, PTA 06/29/23 8:10 AM

## 2023-07-02 DIAGNOSIS — L905 Scar conditions and fibrosis of skin: Secondary | ICD-10-CM | POA: Diagnosis not present

## 2023-07-02 DIAGNOSIS — C44319 Basal cell carcinoma of skin of other parts of face: Secondary | ICD-10-CM | POA: Diagnosis not present

## 2023-07-17 ENCOUNTER — Encounter: Payer: Self-pay | Admitting: Oncology

## 2023-07-17 ENCOUNTER — Other Ambulatory Visit: Payer: Self-pay

## 2023-07-17 DIAGNOSIS — C50311 Malignant neoplasm of lower-inner quadrant of right female breast: Secondary | ICD-10-CM

## 2023-07-17 MED ORDER — LETROZOLE 2.5 MG PO TABS
2.5000 mg | ORAL_TABLET | Freq: Every day | ORAL | 0 refills | Status: DC
  Filled 2023-07-17: qty 90, 90d supply, fill #0

## 2023-07-22 ENCOUNTER — Other Ambulatory Visit: Payer: Self-pay | Admitting: Oncology

## 2023-07-22 DIAGNOSIS — Z1231 Encounter for screening mammogram for malignant neoplasm of breast: Secondary | ICD-10-CM

## 2023-07-22 DIAGNOSIS — Z853 Personal history of malignant neoplasm of breast: Secondary | ICD-10-CM

## 2023-07-27 NOTE — Progress Notes (Unsigned)
 PCP: Calista Catching, FNP   No chief complaint on file.   HPI:      [name] is a 48 y.o. G1P1001 whose LMP was No LMP recorded. (Menstrual status: Chemotherapy)., presents today for her annual examination.  Her menses are absent since chemo for breast cancer last yr. Currently on tamoxifen . No BTB, no dysmen. Has VS sx.   Sex activity: currently sexually active--contraception IUD. Paragard  placed 05/29/22. She does not have vaginal dryness.  Last Pap: 07/24/22 Results were: neg/neg HPV   Last mammogram: 09/04/22 Results were normal, followed by oncology. 8/23 results were cat 5 RT breast with ER/PR+ breast cancer on bx; S/p lumpectomy, chemo, radiation, is on tamoxifen  and zometa  currently. Pt is Invitae panel neg. Followed by onc.  There is a FH of breast cancer in 2 mat grt aunts and 1 pat aunt. There is no FH of ovarian cancer. The patient does do self-breast exams.  Colonoscopy: 12/22 with Dr. Ole Berkeley;  Repeat due after 10 years.   Tobacco use: The patient denies current or previous tobacco use. Alcohol use: none No drug use Exercise: very active  She does get adequate calcium and Vitamin D  in her diet.  Labs with PCP.   Patient Active Problem List   Diagnosis Date Noted   Use of tamoxifen  (Nolvadex ) 07/24/2022   Lymphedema 07/17/2022   Genetic testing 10/24/2021   Malignant neoplasm of lower-inner quadrant of right breast of female, estrogen receptor positive (HCC) 10/01/2021   Colon cancer screening    Intractable menstrual migraine without status migrainosus 07/30/2020   Nonallopathic lesion of sacral region 12/30/2017   Trigger point of left shoulder region 10/06/2017   Slipped rib syndrome 08/05/2017   Nonallopathic lesion of rib cage 08/05/2017   Nonallopathic lesion of cervical region 08/05/2017   Nonallopathic lesion of thoracic region 08/05/2017   Nonallopathic lesion of lumbosacral region 08/05/2017   Chronic left shoulder pain 07/10/2017   Chronic  LLQ pain 05/17/2017   Atopic dermatitis 07/10/2016   Hepatic cyst 07/10/2016   Routine physical examination 07/10/2016   PVC's (premature ventricular contractions) 04/22/2013   Hemochromatosis 04/22/2013    Past Surgical History:  Procedure Laterality Date   BREAST BIOPSY Right 09/25/2021   INVASIVE MAMMARY CARCINOMA,   BREAST LUMPECTOMY Right 10/28/2021   INVASIVE MAMMARY CARCINOMA,   BREAST LUMPECTOMY WITH RADIOACTIVE SEED AND SENTINEL LYMPH NODE BIOPSY Right 10/28/2021   Procedure: RIGHT BREAST LUMPECTOMY WITH RADIOACTIVE SEED AND AXILLARY SENTINEL LYMPH NODE BIOPSY;  Surgeon: Enid Harry, MD;  Location: [facility];  Service: General;  Laterality: Right;   COLONOSCOPY WITH PROPOFOL  N/A 01/24/2021   Procedure: COLONOSCOPY WITH PROPOFOL ;  Surgeon: Marnee Sink, MD;  Location: [facility];  Service: Endoscopy;  Laterality: N/A;   Excision of basal cell carcinoma     PORT-A-CATH REMOVAL Left 03/13/2022   Procedure: REMOVAL PORT-A-CATH;  Surgeon: Enid Harry, MD;  Location: [facility];  Service: General;  Laterality: Left;   PORTACATH PLACEMENT Left 11/19/2021   Procedure: INSERTION PORT-A-CATH;  Surgeon: Enid Harry, MD;  Location: [facility];  Service: General;  Laterality: Left;   WISDOM TOOTH EXTRACTION      Family History  Problem Relation Age of Onset   Hemochromatosis Mother    Basal cell carcinoma Mother    Hypertension Father    Melanoma Father        multiple removed   Colon polyps Father 1       adenomatous  Breast cancer Paternal Aunt 49   Kidney cancer Paternal Aunt    Alzheimer's disease Paternal Aunt    Hyperlipidemia Maternal Grandmother    Hypertension Maternal Grandmother    Osteoporosis Maternal Grandmother    Hyperlipidemia Maternal Grandfather    Hypertension Maternal Grandfather    Diabetes Maternal Grandfather    Stroke Maternal Grandfather    Alzheimer's disease Paternal  Grandmother    Breast cancer Other        late years   Breast cancer Other        late years   Colon cancer Neg Hx     Social History   Socioeconomic History   Marital status: Married    Spouse name: Not on file   Number of children: 1   Years of education: Not on file   Highest education level: Not on file  Occupational History   Occupation: Dietitian  Tobacco Use   Smoking status: Never   Smokeless tobacco: Never  Vaping Use   Vaping status: Never Used  Substance and Sexual Activity   Alcohol use: Not Currently    Alcohol/week: 2.0 standard drinks of alcohol    Types: 2 Cans of beer per week    Comment: 2-4 beer/wine aweek.   Drug use: No   Sexual activity: Not Currently    Partners: Male    Birth control/protection: None  Other Topics Concern   Not on file  Social History Narrative   Married      Daughter -16 yrs.       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[facility]   Social Drivers of Health   Financial Resource Strain: Not on file  Food Insecurity: Not on file  Transportation Needs: Not on file  Physical Activity: Inactive (05/15/2017)   Exercise Vital Sign    Days of Exercise per Week: 0 days    Minutes of Exercise per Session: 0 min  Stress: No Stress Concern Present (05/15/2017)   [employer] of Occupational Health - Occupational Stress Questionnaire    Feeling of Stress : Only a little  Social Connections: Not on file  Intimate Partner Violence: Not on file     Current Outpatient Medications:    Calcium Carb-Cholecalciferol (CALCIUM + VITAMIN D3 PO), Take by mouth. Calcium 600mg  and vit d 400 units take 2 once a day, Disp: , Rfl:    cholecalciferol (VITAMIN D ) 1000 units tablet, Take 1,000 Units by mouth daily., Disp: , Rfl:    Cyanocobalamin  (VITAMIN B 12 PO), Take 1,000 mcg by mouth daily., Disp: , Rfl:    letrozole  (FEMARA ) 2.5 MG tablet, Take 1 tablet (2.5 mg total) by mouth daily., Disp: 90 tablet, Rfl: 0   PARAGARD  INTRAUTERINE COPPER  IU, 1 each by  Intrauterine route once., Disp: , Rfl:    propranolol  (INDERAL ) 20 MG tablet, Take 1 tablet (20 mg total) by mouth daily., Disp: 90 tablet, Rfl: 3   tacrolimus  (PROTOPIC ) 0.1 % ointment, Apply to affected areas twice daily as needed., Disp: 60 g, Rfl: 0 No current facility-administered medications for this visit.  Facility-Administered Medications Ordered in Other Visits:    goserelin (ZOLADEX ) injection 3.6 mg, 3.6 mg, Subcutaneous, Q28 days, Avonne Boettcher, MD     ROS:  Review of Systems  Constitutional:  Negative for fatigue, fever and unexpected weight change.  Respiratory:  Negative for cough, shortness of breath and wheezing.   Cardiovascular:  Negative for chest pain, palpitations and leg swelling.  Gastrointestinal:  Negative for blood in  stool, constipation, diarrhea, nausea and vomiting.  Endocrine: Negative for cold intolerance, heat intolerance and polyuria.  Genitourinary:  Positive for vaginal discharge. Negative for dyspareunia, dysuria, flank pain, frequency, genital sores, hematuria, menstrual problem, pelvic pain, urgency, vaginal bleeding and vaginal pain.  Musculoskeletal:  Positive for arthralgias. Negative for back pain, joint swelling and myalgias.  Skin:  Negative for rash.  Neurological:  Negative for dizziness, syncope, light-headedness, numbness and headaches.  Hematological:  Negative for adenopathy.  Psychiatric/Behavioral:  Negative for agitation, confusion, sleep disturbance and suicidal ideas. The patient is not nervous/anxious.    BREAST: No symptoms    Objective: There were no vitals taken for this visit.   Physical Exam Constitutional:      Appearance: She is well-developed.  Genitourinary:     Vulva normal.     Right Labia: No rash, tenderness or lesions.    Left Labia: No tenderness, lesions or rash.    No vaginal discharge, erythema or tenderness.      Right Adnexa: not tender and no mass present.    Left Adnexa: not tender and no mass  present.    No cervical friability or polyp.     IUD strings visualized.     Uterus is not enlarged or tender.  Breasts:    Right: No mass, nipple discharge, skin change or tenderness.     Left: No mass, nipple discharge, skin change or tenderness.  Neck:     Thyroid : No thyromegaly.   Cardiovascular:     Rate and Rhythm: Normal rate and regular rhythm.     Heart sounds: Normal heart sounds. No murmur heard. Pulmonary:     Effort: Pulmonary effort is normal.     Breath sounds: Normal breath sounds.  Abdominal:     Palpations: Abdomen is soft.     Tenderness: There is no abdominal tenderness. There is no guarding or rebound.   Musculoskeletal:        General: Normal range of motion.     Cervical back: Normal range of motion.  Lymphadenopathy:     Cervical: No cervical adenopathy.   Neurological:     General: No focal deficit present.     Mental Status: She is alert and oriented to person, place, and time.     Cranial Nerves: No cranial nerve deficit.   Skin:    General: Skin is warm and dry.   Psychiatric:        Mood and Affect: Mood normal.        Behavior: Behavior normal.        Thought Content: Thought content normal.        Judgment: Judgment normal.  Vitals reviewed.     Assessment/Plan:  Encounter for annual routine gynecological examination  Cervical cancer screening - Plan: Cytology - PAP  Screening for HPV (human papillomavirus) - Plan: Cytology - PAP  ASCUS of cervix with negative high risk HPV - Plan: Cytology - PAP; repeat pap today, will f/u if abn.   Encounter for routine checking of intrauterine contraceptive device (IUD)--IUD strings in cx os, has 10 yr indication  Encounter for screening mammogram for malignant neoplasm of breast; pt followed by onc. Has upcoming appt  Malignant neoplasm of lower-inner quadrant of right breast of female, estrogen receptor positive (HCC)  Use of tamoxifen  (Nolvadex )--f/u prn AUB          GYN counsel  breast self exam, mammography screening, adequate intake of calcium and vitamin D , diet and exercise  F/U  No follow-ups on file.  Shaleta Ruacho B. Kyleeann Cremeans, PA-C 07/27/2023 8:36 PM

## 2023-07-28 ENCOUNTER — Ambulatory Visit (INDEPENDENT_AMBULATORY_CARE_PROVIDER_SITE_OTHER): Admitting: Obstetrics and Gynecology

## 2023-07-28 ENCOUNTER — Encounter: Payer: Self-pay | Admitting: Obstetrics and Gynecology

## 2023-07-28 VITALS — BP 127/86 | HR 69 | Ht 66.0 in | Wt 144.0 lb

## 2023-07-28 DIAGNOSIS — Z01419 Encounter for gynecological examination (general) (routine) without abnormal findings: Secondary | ICD-10-CM

## 2023-07-28 DIAGNOSIS — Z30431 Encounter for routine checking of intrauterine contraceptive device: Secondary | ICD-10-CM

## 2023-07-28 DIAGNOSIS — N941 Unspecified dyspareunia: Secondary | ICD-10-CM

## 2023-07-28 DIAGNOSIS — Z1231 Encounter for screening mammogram for malignant neoplasm of breast: Secondary | ICD-10-CM

## 2023-07-28 DIAGNOSIS — N951 Menopausal and female climacteric states: Secondary | ICD-10-CM

## 2023-07-28 DIAGNOSIS — Z170 Estrogen receptor positive status [ER+]: Secondary | ICD-10-CM

## 2023-07-28 DIAGNOSIS — Z79810 Long term (current) use of selective estrogen receptor modulators (SERMs): Secondary | ICD-10-CM

## 2023-07-28 NOTE — Patient Instructions (Signed)
 I value your feedback and you entrusting [place] with your care. If you get a [place] patient survey, I would appreciate you taking the time to let us know about your experience today. Thank you! ? ? ?

## 2023-09-09 ENCOUNTER — Ambulatory Visit
Admission: RE | Admit: 2023-09-09 | Discharge: 2023-09-09 | Disposition: A | Discharge status: Home / Self Care | Source: Ambulatory Visit | Attending: Oncology | Admitting: Oncology

## 2023-09-09 DIAGNOSIS — Z853 Personal history of malignant neoplasm of breast: Secondary | ICD-10-CM | POA: Diagnosis not present

## 2023-09-09 DIAGNOSIS — R92333 Mammographic heterogeneous density, bilateral breasts: Secondary | ICD-10-CM | POA: Diagnosis not present

## 2023-09-09 DIAGNOSIS — Z1231 Encounter for screening mammogram for malignant neoplasm of breast: Secondary | ICD-10-CM

## 2023-09-22 ENCOUNTER — Inpatient Hospital Stay: Payer: 59

## 2023-09-22 ENCOUNTER — Inpatient Hospital Stay (HOSPITAL_BASED_OUTPATIENT_CLINIC_OR_DEPARTMENT_OTHER): Payer: 59 | Admitting: Oncology

## 2023-09-22 ENCOUNTER — Inpatient Hospital Stay: Payer: 59 | Attending: Oncology

## 2023-09-22 ENCOUNTER — Encounter: Payer: Self-pay | Admitting: Oncology

## 2023-09-22 VITALS — BP 123/87 | HR 85 | Temp 97.8°F | Resp 20 | Wt 143.0 lb

## 2023-09-22 DIAGNOSIS — Z7983 Long term (current) use of bisphosphonates: Secondary | ICD-10-CM | POA: Diagnosis not present

## 2023-09-22 DIAGNOSIS — C50311 Malignant neoplasm of lower-inner quadrant of right female breast: Secondary | ICD-10-CM | POA: Insufficient documentation

## 2023-09-22 DIAGNOSIS — Z1721 Progesterone receptor positive status: Secondary | ICD-10-CM | POA: Diagnosis not present

## 2023-09-22 DIAGNOSIS — Z79811 Long term (current) use of aromatase inhibitors: Secondary | ICD-10-CM | POA: Diagnosis not present

## 2023-09-22 DIAGNOSIS — Z1732 Human epidermal growth factor receptor 2 negative status: Secondary | ICD-10-CM | POA: Insufficient documentation

## 2023-09-22 DIAGNOSIS — Z803 Family history of malignant neoplasm of breast: Secondary | ICD-10-CM | POA: Diagnosis not present

## 2023-09-22 DIAGNOSIS — Z79899 Other long term (current) drug therapy: Secondary | ICD-10-CM | POA: Diagnosis not present

## 2023-09-22 DIAGNOSIS — Z170 Estrogen receptor positive status [ER+]: Secondary | ICD-10-CM

## 2023-09-22 DIAGNOSIS — Z5181 Encounter for therapeutic drug level monitoring: Secondary | ICD-10-CM

## 2023-09-22 LAB — CBC WITH DIFFERENTIAL (CANCER CENTER ONLY)
Abs Immature Granulocytes: 0.02 K/uL (ref 0.00–0.07)
Basophils Absolute: 0 K/uL (ref 0.0–0.1)
Basophils Relative: 1 %
Eosinophils Absolute: 0.1 K/uL (ref 0.0–0.5)
Eosinophils Relative: 1 %
HCT: 39.8 % (ref 36.0–46.0)
Hemoglobin: 13.3 g/dL (ref 12.0–15.0)
Immature Granulocytes: 0 %
Lymphocytes Relative: 38 %
Lymphs Abs: 2 K/uL (ref 0.7–4.0)
MCH: 32.4 pg (ref 26.0–34.0)
MCHC: 33.4 g/dL (ref 30.0–36.0)
MCV: 96.8 fL (ref 80.0–100.0)
Monocytes Absolute: 0.4 K/uL (ref 0.1–1.0)
Monocytes Relative: 8 %
Neutro Abs: 2.7 K/uL (ref 1.7–7.7)
Neutrophils Relative %: 52 %
Platelet Count: 205 K/uL (ref 150–400)
RBC: 4.11 MIL/uL (ref 3.87–5.11)
RDW: 11.9 % (ref 11.5–15.5)
WBC Count: 5.2 K/uL (ref 4.0–10.5)
nRBC: 0 % (ref 0.0–0.2)

## 2023-09-22 LAB — CMP (CANCER CENTER ONLY)
ALT: 15 U/L (ref 0–44)
AST: 17 U/L (ref 15–41)
Albumin: 3.7 g/dL (ref 3.5–5.0)
Alkaline Phosphatase: 60 U/L (ref 38–126)
Anion gap: 7 (ref 5–15)
BUN: 17 mg/dL (ref 6–20)
CO2: 27 mmol/L (ref 22–32)
Calcium: 9.2 mg/dL (ref 8.9–10.3)
Chloride: 103 mmol/L (ref 98–111)
Creatinine: 0.57 mg/dL (ref 0.44–1.00)
GFR, Estimated: >60 mL/min (ref >60)
Glucose, Bld: 98 mg/dL (ref 70–99)
Potassium: 3.9 mmol/L (ref 3.5–5.1)
Sodium: 137 mmol/L (ref 135–145)
Total Bilirubin: 0.5 mg/dL (ref 0.0–1.2)
Total Protein: 6.7 g/dL (ref 6.5–8.1)

## 2023-09-22 LAB — FERRITIN: Ferritin: 52 ng/mL (ref 11–307)

## 2023-09-22 MED ORDER — SODIUM CHLORIDE 0.9 % IV SOLN
Freq: Once | INTRAVENOUS | Status: AC
  Filled 2023-09-22: qty 250

## 2023-09-22 MED ORDER — ZOLEDRONIC ACID 4 MG/100ML IV SOLN
4.0000 mg | INTRAVENOUS | Status: DC
  Administered 2023-09-22 (×2): 4 mg via INTRAVENOUS
  Filled 2023-09-22: qty 100

## 2023-09-24 ENCOUNTER — Encounter: Payer: Self-pay | Admitting: Oncology

## 2023-09-24 NOTE — Progress Notes (Signed)
 Hematology/Oncology Consult note [facility]  Telephone:([phone] [phone]  Patient Care Team: Dineen Rollene MATSU, FNP as PCP - General (Family Medicine) Georgina Shasta POUR, RN as Oncology Nurse Navigator Melanee Annah BROCKS, MD as Consulting Physician (Oncology) Ebbie Cough, MD as Consulting Physician (General Surgery) Izell Domino, MD as Attending Physician (Radiation Oncology)   Name of the patient: Adrienne Harris  [id number]  Jun 22, 1975   Date of visit: 09/24/23  Diagnosis- pathological prognostic stage Ia invasive mammary carcinoma of the right breast ER/PR positive HER2 negative     History of hereditary hemochromatosis without evidence of clinical iron overload  Chief complaint/ Reason for visit-routine follow-up of breast cancer and hereditary hemochromatosis  Heme/Onc history: patient is a 48 year old female who recently underwent aLateral screening mammogram which showed a possible area of concern in the right breast.  This was followed by diagnostic mammogram and ultrasound which showed a 7 mm mass in the right lower breast.  No suspicious right axillary adenopathy.  Biopsy confirmed invasive mammary carcinoma 6 mm grade 3 ER 51 to 90% positive PR 51 to 90% positive and HER2 negative.     Oncotype score came back at 23 with an absolute chemotherapy benefit of about 6.5%.  Adjuvant TC chemotherapy was recommended.  4 cycles of chemotherapy were completed on 01/29/2022.  Patient is currently undergoing adjuvant radiation therapy.   Patient has history of compound heterozygosity for C282Y and H63D and was diagnosed with hereditary hemochromatosis in 2009 requiring multiple phlebotomies.  She does not have any baseline history of cirrhosis.  Patient was switched from tamoxifen  to letrozole  in February 2025    Interval history-patient has not had any menstrual cycles for about 2 years now.  She is tolerating letrozole  well without any significant  side effects.  Denies any breast concerns.  Denies any changes in her appetite or weight.  ECOG PS- 0 Pain scale- 0   Review of systems- Review of Systems  Constitutional:  Negative for chills, fever, malaise/fatigue and weight loss.  HENT:  Negative for congestion, ear discharge and nosebleeds.   Eyes:  Negative for blurred vision.  Respiratory:  Negative for cough, hemoptysis, sputum production, shortness of breath and wheezing.   Cardiovascular:  Negative for chest pain, palpitations, orthopnea and claudication.  Gastrointestinal:  Negative for abdominal pain, blood in stool, constipation, diarrhea, heartburn, melena, nausea and vomiting.  Genitourinary:  Negative for dysuria, flank pain, frequency, hematuria and urgency.  Musculoskeletal:  Negative for back pain, joint pain and myalgias.  Skin:  Negative for rash.  Neurological:  Negative for dizziness, tingling, focal weakness, seizures, weakness and headaches.  Endo/Heme/Allergies:  Does not bruise/bleed easily.  Psychiatric/Behavioral:  Negative for depression and suicidal ideas. The patient does not have insomnia.       Allergies  Allergen Reactions   Propylene Glycol     Skin rash   Hydrocortisone Rash    Allergic to all topical steriods Allergic to all topical corticosteriods   Other Rash    Topical steroid      Past Medical History:  Diagnosis Date   Atopic dermatitis    Basal cell carcinoma    15 years ago.   BRCA negative 2023   Invitae panel neg at [facility]   Breast cancer in female [facility])    Dysrhythmia    Hemochromatosis    Heterozygous for two genes   Palpitations    a. 04/2013 48h Holter: sinus rhythm/sinus arrhythmia, rare PAC's/PVC's.  PVC (premature ventricular contraction)    Shoulder pain, left 01/24/2015   Shoulder pain, right 01/24/2015     Past Surgical History:  Procedure Laterality Date   BREAST BIOPSY Right 09/25/2021   INVASIVE MAMMARY CARCINOMA,   BREAST LUMPECTOMY Right  10/28/2021   INVASIVE MAMMARY CARCINOMA,   BREAST LUMPECTOMY WITH RADIOACTIVE SEED AND SENTINEL LYMPH NODE BIOPSY Right 10/28/2021   Procedure: RIGHT BREAST LUMPECTOMY WITH RADIOACTIVE SEED AND AXILLARY SENTINEL LYMPH NODE BIOPSY;  Surgeon: Ebbie Cough, MD;  Location: [facility];  Service: General;  Laterality: Right;   COLONOSCOPY WITH PROPOFOL  N/A 01/24/2021   Procedure: COLONOSCOPY WITH PROPOFOL ;  Surgeon: Jinny Carmine, MD;  Location: [facility];  Service: Endoscopy;  Laterality: N/A;   Excision of basal cell carcinoma     PORT-A-CATH REMOVAL Left 03/13/2022   Procedure: REMOVAL PORT-A-CATH;  Surgeon: Ebbie Cough, MD;  Location: [facility];  Service: General;  Laterality: Left;   PORTACATH PLACEMENT Left 11/19/2021   Procedure: INSERTION PORT-A-CATH;  Surgeon: Ebbie Cough, MD;  Location: [facility];  Service: General;  Laterality: Left;   WISDOM TOOTH EXTRACTION      Social History   Socioeconomic History   Marital status: Married    Spouse name: Not on file   Number of children: 1   Years of education: Not on file   Highest education level: Not on file  Occupational History   Occupation: Dietitian  Tobacco Use   Smoking status: Never   Smokeless tobacco: Never  Vaping Use   Vaping status: Never Used  Substance and Sexual Activity   Alcohol use: Not Currently    Alcohol/week: 2.0 standard drinks of alcohol    Types: 2 Cans of beer per week    Comment: 2-4 beer/wine aweek.   Drug use: No   Sexual activity: Yes    Partners: Male    Birth control/protection: None  Other Topics Concern   Not on file  Social History Narrative   Married      Daughter -16 yrs.       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[facility]   Social Drivers of Health   Financial Resource Strain: Not on file  Food Insecurity: Not on file  Transportation Needs: Not on file  Physical Activity: Inactive (05/15/2017)   Exercise Vital Sign     Days of Exercise per Week: 0 days    Minutes of Exercise per Session: 0 min  Stress: No Stress Concern Present (05/15/2017)   [employer] of Occupational Health - Occupational Stress Questionnaire    Feeling of Stress : Only a little  Social Connections: Not on file  Intimate Partner Violence: Not on file    Family History  Problem Relation Age of Onset   Hemochromatosis Mother    Basal cell carcinoma Mother    Hypertension Father    Melanoma Father        multiple removed   Colon polyps Father 57       adenomatous   Breast cancer Paternal Aunt 28   Kidney cancer Paternal Aunt    Alzheimer's disease Paternal Aunt    Hyperlipidemia Maternal Grandmother    Hypertension Maternal Grandmother    Osteoporosis Maternal Grandmother    Hyperlipidemia Maternal Grandfather    Hypertension Maternal Grandfather    Diabetes Maternal Grandfather    Stroke Maternal Grandfather    Alzheimer's disease Paternal Grandmother    Breast cancer Other        late years  Breast cancer Other        late years   Colon cancer Neg Hx      Current Outpatient Medications:    Calcium Carb-Cholecalciferol (CALCIUM + VITAMIN D3 PO), Take by mouth. Calcium 600mg  and vit d 400 units take 2 once a day, Disp: , Rfl:    cholecalciferol (VITAMIN D) 1000 units tablet, Take 1,000 Units by mouth daily., Disp: , Rfl:    Cyanocobalamin (VITAMIN B 12 PO), Take 1,000 mcg by mouth daily., Disp: , Rfl:    letrozole (FEMARA) 2.5 MG tablet, Take 1 tablet (2.5 mg total) by mouth daily., Disp: 90 tablet, Rfl: 0   PARAGARD INTRAUTERINE COPPER IU, 1 each by Intrauterine route once., Disp: , Rfl:    propranolol (INDERAL) 20 MG tablet, Take 1 tablet (20 mg total) by mouth daily., Disp: 90 tablet, Rfl: 3   tacrolimus (PROTOPIC) 0.1 % ointment, Apply to affected areas twice daily as needed., Disp: 60 g, Rfl: 0 No current facility-administered medications for this visit.  Facility-Administered Medications Ordered in Other  Visits:    goserelin (ZOLADEX) injection 3.6 mg, 3.6 mg, Subcutaneous, Q28 days, Melanee Annah BROCKS, MD  Physical exam:  Vitals:   09/22/23 1509  BP: 123/87  Pulse: 85  Resp: 20  Temp: 97.8 F (36.6 C)  SpO2: 100%  Weight: 143 lb (64.9 kg)   Physical Exam Cardiovascular:     Rate and Rhythm: Normal rate and regular rhythm.     Heart sounds: Normal heart sounds.  Pulmonary:     Effort: Pulmonary effort is normal.     Breath sounds: Normal breath sounds.  Abdominal:     General: There is no distension.  Skin:    General: Skin is warm and dry.  Neurological:     Mental Status: She is alert and oriented to person, place, and time.    Breast exam was performed in seated and lying down position. Patient is status post right lumpectomy with a well-healed surgical scar. No evidence of any palpable masses. No evidence of axillary adenopathy. No evidence of any palpable masses or lumps in the left breast. No evidence of leftt axillary adenopathy   I have personally reviewed labs listed below:    Latest Ref Rng & Units 09/22/2023   12:07 PM  CMP  Glucose 70 - 99 mg/dL 98   BUN 6 - 20 mg/dL 17   Creatinine 9.55 - 1.00 mg/dL 9.42   Sodium 864 - 854 mmol/L 137   Potassium 3.5 - 5.1 mmol/L 3.9   Chloride 98 - 111 mmol/L 103   CO2 22 - 32 mmol/L 27   Calcium 8.9 - 10.3 mg/dL 9.2   Total Protein 6.5 - 8.1 g/dL 6.7   Total Bilirubin 0.0 - 1.2 mg/dL 0.5   Alkaline Phos 38 - 126 U/L 60   AST 15 - 41 U/L 17   ALT 0 - 44 U/L 15       Latest Ref Rng & Units 09/22/2023   12:07 PM  CBC  WBC 4.0 - 10.5 K/uL 5.2   Hemoglobin 12.0 - 15.0 g/dL 86.6   Hematocrit 63.9 - 46.0 % 39.8   Platelets 150 - 400 K/uL 205    I have personally reviewed Radiology images listed below: No images are attached to the encounter.  MM 3D DIAGNOSTIC MAMMOGRAM BILATERAL BREAST Result Date: 09/09/2023 CLINICAL DATA:  Annual examination status post RIGHT breast lumpectomy in September 2023, with negative  margins. Patient received adjuvant radiation  therapy and is currently on letrozole. EXAM: DIGITAL DIAGNOSTIC BILATERAL MAMMOGRAM WITH TOMOSYNTHESIS AND CAD TECHNIQUE: Bilateral digital diagnostic mammography and breast tomosynthesis was performed. The images were evaluated with computer-aided detection. COMPARISON:  Previous exam(s). ACR Breast Density Category c: The breasts are heterogeneously dense, which may obscure small masses. FINDINGS: Stable postsurgical changes in the right breast. Spot magnification view of the lumpectomy bed does not demonstrate any new suspicious density or calcification. No new suspicious mass, calcification, or other findings in bilateral breasts. IMPRESSION: 1. No mammographic evidence of malignancy in BILATERAL breasts. 2. Stable postlumpectomy changes in the RIGHT breast. RECOMMENDATION: BILATERAL diagnostic mammogram in 12 months to continue with post lumpectomy surveillance protocol. I have discussed the findings and recommendations with the patient. If applicable, a reminder letter will be sent to the patient regarding the next appointment. BI-RADS CATEGORY  2: Benign. Electronically Signed   By: Dirk Arrant M.D.   On: 09/09/2023 15:41     Assessment and plan- Patient is a 48 y.o. female who is here for follow-up of following issues:  History of stage I right breast cancer ER/PR positive HER2 negative: She is doing well clinically with no concerning signs and symptoms of recurrence based on today's exam.  She was premenopausal at diagnosis and therefore will continue with hormone therapy for 10 years.  Tolerating letrozole well so far.  Mammogram from 09/09/2023 was unremarkable.  I will see her back in 6 months.  Patient is receiving adjuvant Zometa every 6 months and today's dose 4 out of 6.  Calcium levels acceptable to proceed with Zometa today.  History of compound heterozygosity for C282Y and H63D.  She does not have clinical evidence of iron overload.   Ferritin levels remain less than 100 and therefore she does not require any phlebotomy.  Patient had an MRI abdomen back in 2018 which showed a 15 mm hypervascular lesion in segment 4B which was classified as benign at that time.  I will plan to get a repeat MRI abdomen at this time to follow-up on those findings.  The lesion had remained stable across 2 scans back then   Visit Diagnosis 1. Encounter for monitoring zoledronate therapy   2. Malignant neoplasm of lower-inner quadrant of right breast of female, estrogen receptor positive (HCC)   3. Encounter for monitoring zoledronic acid therapy      Dr. Annah Skene, MD, MPH [facility] at [facility] [phone] 09/24/2023 6:25 PM

## 2023-09-25 ENCOUNTER — Other Ambulatory Visit: Payer: Self-pay

## 2023-09-25 DIAGNOSIS — Z170 Estrogen receptor positive status [ER+]: Secondary | ICD-10-CM

## 2023-10-02 ENCOUNTER — Ambulatory Visit
Admission: RE | Admit: 2023-10-02 | Discharge: 2023-10-02 | Disposition: A | Discharge status: Home / Self Care | Source: Ambulatory Visit | Attending: Oncology | Admitting: Oncology

## 2023-10-02 ENCOUNTER — Encounter: Payer: Self-pay | Admitting: Radiology

## 2023-10-02 DIAGNOSIS — C50311 Malignant neoplasm of lower-inner quadrant of right female breast: Secondary | ICD-10-CM | POA: Insufficient documentation

## 2023-10-02 DIAGNOSIS — Z170 Estrogen receptor positive status [ER+]: Secondary | ICD-10-CM | POA: Diagnosis not present

## 2023-10-02 DIAGNOSIS — K7689 Other specified diseases of liver: Secondary | ICD-10-CM | POA: Diagnosis not present

## 2023-10-02 DIAGNOSIS — Z8505 Personal history of malignant neoplasm of liver: Secondary | ICD-10-CM | POA: Diagnosis not present

## 2023-10-02 MED ORDER — GADOBUTROL 1 MMOL/ML IV SOLN
6.0000 mL | Freq: Once | INTRAVENOUS | Status: AC | PRN
  Administered 2023-10-02: 6 mL via INTRAVENOUS

## 2023-10-05 ENCOUNTER — Ambulatory Visit: Attending: General Surgery

## 2023-10-05 VITALS — Wt 138.5 lb

## 2023-10-05 DIAGNOSIS — Z483 Aftercare following surgery for neoplasm: Secondary | ICD-10-CM | POA: Insufficient documentation

## 2023-10-05 NOTE — Therapy (Signed)
 OUTPATIENT PHYSICAL THERAPY SOZO SCREENING NOTE   Patient Name: Adrienne Harris MRN: [id number] DOB:20-Nov-1975, 48 y.o., female Today's Date: 10/05/2023  PCP: Dineen Rollene MATSU, FNP REFERRING PROVIDER: Ebbie Cough, MD   PT End of Session - 10/05/23 1648     Visit Number 9   # unchanged due to screen only   PT Start Time 1646    PT Stop Time 1650    PT Time Calculation (min) 4 min    Activity Tolerance Patient tolerated treatment well    Behavior During Therapy WFL for tasks assessed/performed          Past Medical History:  Diagnosis Date   Atopic dermatitis    Basal cell carcinoma    15 years ago.   BRCA negative 2023   Invitae panel neg at [facility]   Breast cancer in female [facility])    Dysrhythmia    Hemochromatosis    Heterozygous for two genes   Palpitations    a. 04/2013 48h Holter: sinus rhythm/sinus arrhythmia, rare PAC's/PVC's.   PVC (premature ventricular contraction)    Shoulder pain, left 01/24/2015   Shoulder pain, right 01/24/2015   Past Surgical History:  Procedure Laterality Date   BREAST BIOPSY Right 09/25/2021   INVASIVE MAMMARY CARCINOMA,   BREAST LUMPECTOMY Right 10/28/2021   INVASIVE MAMMARY CARCINOMA,   BREAST LUMPECTOMY WITH RADIOACTIVE SEED AND SENTINEL LYMPH NODE BIOPSY Right 10/28/2021   Procedure: RIGHT BREAST LUMPECTOMY WITH RADIOACTIVE SEED AND AXILLARY SENTINEL LYMPH NODE BIOPSY;  Surgeon: Ebbie Cough, MD;  Location: [facility];  Service: General;  Laterality: Right;   COLONOSCOPY WITH PROPOFOL  N/A 01/24/2021   Procedure: COLONOSCOPY WITH PROPOFOL ;  Surgeon: Jinny Carmine, MD;  Location: [facility];  Service: Endoscopy;  Laterality: N/A;   Excision of basal cell carcinoma     PORT-A-CATH REMOVAL Left 03/13/2022   Procedure: REMOVAL PORT-A-CATH;  Surgeon: Ebbie Cough, MD;  Location: [facility];  Service: General;  Laterality: Left;   PORTACATH PLACEMENT Left 11/19/2021   Procedure:  INSERTION PORT-A-CATH;  Surgeon: Ebbie Cough, MD;  Location: [facility];  Service: General;  Laterality: Left;   WISDOM TOOTH EXTRACTION     Patient Active Problem List   Diagnosis Date Noted   Use of tamoxifen  (Nolvadex ) 07/24/2022   Lymphedema 07/17/2022   Genetic testing 10/24/2021   Malignant neoplasm of lower-inner quadrant of right breast of female, estrogen receptor positive (HCC) 10/01/2021   Colon cancer screening    Intractable menstrual migraine without status migrainosus 07/30/2020   Nonallopathic lesion of sacral region 12/30/2017   Trigger point of left shoulder region 10/06/2017   Slipped rib syndrome 08/05/2017   Nonallopathic lesion of rib cage 08/05/2017   Nonallopathic lesion of cervical region 08/05/2017   Nonallopathic lesion of thoracic region 08/05/2017   Nonallopathic lesion of lumbosacral region 08/05/2017   Chronic left shoulder pain 07/10/2017   Chronic LLQ pain 05/17/2017   Atopic dermatitis 07/10/2016   Hepatic cyst 07/10/2016   Routine physical examination 07/10/2016   PVC's (premature ventricular contractions) 04/22/2013   Hemochromatosis 04/22/2013    REFERRING DIAG: right breast cancer at risk for lymphedema  THERAPY DIAG:  Aftercare following surgery for neoplasm  PERTINENT HISTORY:  Patient was diagnosed on 09/25/2021 with right grade 3 invasive mammary carcinoma of no particular type. She underwent a right lumpectomy and sentinel node biopsy on 09/28/2021. It is ER/PR positive and HER2 negative and the Ki67 was not measured.    PRECAUTIONS:  right UE Lymphedema risk  SUBJECTIVE: Pt returns for her last 3 month L-Dex screen. I've started having some elbow pain recently and stopped lifting weights. Not sure what the problem is.  PAIN:  No  SOZO SCREENING: Patient was assessed today using the SOZO machine to determine the lymphedema index score. This was compared to her baseline score. It was determined that she is within  the recommended range when compared to her baseline and no further action is needed at this time. She will continue SOZO screenings. These are done every 3 months for 2 years post operatively followed by every 6 months for 2 years, and then annually.  Issued [facility] number to [place] at request of orthopedist number.    L-DEX FLOWSHEETS - 10/05/23 1600       L-DEX LYMPHEDEMA SCREENING   Measurement Type Unilateral    L-DEX MEASUREMENT EXTREMITY Upper Extremity    POSITION  Standing    DOMINANT SIDE Right    At Risk Side Right    BASELINE SCORE (UNILATERAL) -6.4    L-DEX SCORE (UNILATERAL) -5    VALUE CHANGE (UNILAT) 1.4          P: Begin 6 month SOZO  Berwyn Knights, PTA 10/05/23 4:52 PM

## 2023-10-09 ENCOUNTER — Ambulatory Visit: Payer: 59 | Admitting: Family

## 2023-10-09 ENCOUNTER — Other Ambulatory Visit: Payer: Self-pay

## 2023-10-09 ENCOUNTER — Encounter: Payer: Self-pay | Admitting: Family

## 2023-10-09 ENCOUNTER — Ambulatory Visit

## 2023-10-09 VITALS — BP 122/78 | HR 78 | Temp 98.1°F | Ht 66.0 in | Wt 134.2 lb

## 2023-10-09 DIAGNOSIS — Z13220 Encounter for screening for lipoid disorders: Secondary | ICD-10-CM

## 2023-10-09 DIAGNOSIS — Z0000 Encounter for general adult medical examination without abnormal findings: Secondary | ICD-10-CM | POA: Diagnosis not present

## 2023-10-09 DIAGNOSIS — I493 Ventricular premature depolarization: Secondary | ICD-10-CM | POA: Diagnosis not present

## 2023-10-09 DIAGNOSIS — C44310 Basal cell carcinoma of skin of unspecified parts of face: Secondary | ICD-10-CM | POA: Diagnosis not present

## 2023-10-09 DIAGNOSIS — C4491 Basal cell carcinoma of skin, unspecified: Secondary | ICD-10-CM | POA: Insufficient documentation

## 2023-10-09 DIAGNOSIS — Z136 Encounter for screening for cardiovascular disorders: Secondary | ICD-10-CM | POA: Diagnosis not present

## 2023-10-09 DIAGNOSIS — M25521 Pain in right elbow: Secondary | ICD-10-CM | POA: Diagnosis not present

## 2023-10-09 DIAGNOSIS — Z1159 Encounter for screening for other viral diseases: Secondary | ICD-10-CM | POA: Diagnosis not present

## 2023-10-09 LAB — LIPID PANEL
Cholesterol: 174 mg/dL (ref 0–200)
HDL: 68.8 mg/dL (ref >39.00)
LDL Cholesterol: 93 mg/dL (ref 0–99)
NonHDL: 104.78
Total CHOL/HDL Ratio: 3
Triglycerides: 59 mg/dL (ref 0.0–149.0)
VLDL: 11.8 mg/dL (ref 0.0–40.0)

## 2023-10-09 LAB — VITAMIN D 25 HYDROXY (VIT D DEFICIENCY, FRACTURES): VITD: 45.28 ng/mL (ref 30.00–100.00)

## 2023-10-09 LAB — B12 AND FOLATE PANEL
Folate: 14.9 ng/mL (ref >5.9)
Vitamin B-12: 592 pg/mL (ref 211–911)

## 2023-10-09 LAB — TSH: TSH: 0.92 u[IU]/mL (ref 0.35–5.50)

## 2023-10-09 LAB — HEMOGLOBIN A1C: Hgb A1c MFr Bld: 5.7 % (ref 4.6–6.5)

## 2023-10-09 MED ORDER — PROPRANOLOL HCL 20 MG PO TABS
20.0000 mg | ORAL_TABLET | Freq: Every day | ORAL | 3 refills | Status: AC
  Filled 2023-10-09 – 2023-11-30 (×2): qty 90, 90d supply, fill #0
  Filled 2024-02-27: qty 90, 90d supply, fill #1

## 2023-10-09 MED ORDER — MELOXICAM 7.5 MG PO TABS
7.5000 mg | ORAL_TABLET | Freq: Every day | ORAL | 1 refills | Status: DC | PRN
  Filled 2023-10-09: qty 30, 30d supply, fill #0

## 2023-10-09 MED ORDER — CYCLOBENZAPRINE HCL 5 MG PO TABS
5.0000 mg | ORAL_TABLET | Freq: Every evening | ORAL | 1 refills | Status: DC | PRN
  Filled 2023-10-09: qty 30, 15d supply, fill #0
  Filled 2024-01-19: qty 30, 15d supply, fill #1

## 2023-10-09 NOTE — Progress Notes (Signed)
 Assessment & Plan:  Routine physical examination Assessment & Plan: Diagnostic mammogram is up-to-date.  Deferred pelvic exam in the absence complaints and she is following with GYN.  Pap smear is up-to-date.  Bone density is scheduled.  Congratulated patient on diligence to exercise.  Orders: -     Propranolol  HCl; Take 1 tablet (20 mg total) by mouth daily.  Dispense: 90 tablet; Refill: 3 -     TSH -     Hemoglobin A1c -     Lipid panel -     VITAMIN D  25 Hydroxy (Vit-D Deficiency, Fractures) -     B12 and Folate Panel -     Hepatitis C antibody  PVC's (premature ventricular contractions) Assessment & Plan: Chronic, stable.  Continue propranolol  20 mg once daily as also helpful for anxiety.    Orders: -     Propranolol  HCl; Take 1 tablet (20 mg total) by mouth daily.  Dispense: 90 tablet; Refill: 3  Basal cell carcinoma (BCC) of skin of face, unspecified part of face  Right elbow pain Assessment & Plan: Patient is consistent with lateral epicondylitis.  Pending x-ray.  Discussed icing regimen, short course of meloxicam .  Will plan on orthopedic consult if pain does not resolve.  Of note, Provided prn Flexeril  to be used as needed for upper shoulder back pain.  Orders: -     Meloxicam ; Take 1 tablet (7.5 mg total) by mouth daily as needed for pain.  Dispense: 30 tablet; Refill: 1 -     Ambulatory referral to Orthopedic Surgery -     DG ELBOW COMPLETE RIGHT (3+VIEW); Future -     Cyclobenzaprine  HCl; Take 1-2 tablets (5-10 mg total) by mouth at bedtime as needed for muscle spasms.  Dispense: 30 tablet; Refill: 1  Encounter for lipid screening for cardiovascular disease -     Lipid panel  Encounter for hepatitis C screening test for low risk patient     Return precautions given.   Risks, benefits, and alternatives of the medications and treatment plan prescribed today were discussed, and patient expressed understanding.   Education regarding symptom management and  diagnosis given to patient on AVS either electronically or printed.  Return for Complete Physical Exam.  Adrienne Northern, FNP  Subjective:    Patient ID: Adrienne Harris, female    DOB: 12/25/1975, 48 y.o.   MRN: [id number]  CC: Adrienne Harris is a 48 y.o. female who presents today for physical exam.    HPI: She complains of right elbow pain with flexion, for 2- mos, unchanged She has stopped free weights for one month without improvement of pain  Tried ibuprofen 200mg  prn without improvement.  She has isolated right upper shoulder and neck pain; pain is not connected to shoulder pain. Massage monthly without significant improvement. No fever, chills, edema, numbness in right arm.  No trauma or known injury   She had PT this past and score for lymphedema was low   She has completed 4 of the 6 doses of Zometa .  She is no longer on tamoxifen  and is now on letrozole , likely for 10 years.   History of basal cell carcinoma 06/2023; following with Dr Dela   Colorectal Cancer Screening: UTD , 01/24/2021, Dr. Jinny, no specimens collected.repeat in 5 years due to father's history of polys  Breast Cancer Screening: Mammogram UTD, 09/09/2023 ,bilateral diagnostic mammogram due in 12 months with post lumpectomy surveillance protocol. Cervical Cancer Screening: 07/24/22 NILM, neg HPV; following with  Alicia Copland.   Bone Health screening/DEXA for 65+:scheduled  Lung Cancer Screening: Doesn't have 20 year pack year history and age > 64 years yo 80 years Hepatitis B vaccine-No h/o liver disease, IV drug use, blood transfusion, nor work around bodily fluids.  Patient suspects that they completed Hep B vaccine series in the past. We agreed to defer screening for Hep B immunity.         Tetanus -up-to-date        Pneumococcal - Candidate for. declines Hepatitis C screening - Candidate for; consents  Exercise: Gets regular exercise 5 days per week.  Alcohol use: Occasional Smoking/tobacco use:  Nonsmoker.    Health Maintenance  Topic Date Due   COVID-19 Vaccine (4 - 2025-26 season) 10/12/2023   Pneumococcal Vaccine (1 of 2 - PCV) 10/08/2024*   Colon Cancer Screening  01/24/2026   Pap with HPV screening  07/24/2027   DTaP/Tdap/Td vaccine (2 - Td or Tdap) 09/06/2029   Hepatitis C Screening  Completed   HIV Screening  Completed   HPV Vaccine  Aged Out   Meningitis B Vaccine  Aged Out   Hepatitis B Vaccine  Discontinued  *Topic was postponed. The date shown is not the original due date.    ALLERGIES: Propylene glycol, Hydrocortisone, and Other  Current Outpatient Medications on File Prior to Visit  Medication Sig Dispense Refill   Calcium Carb-Cholecalciferol (CALCIUM + VITAMIN D3 PO) Take by mouth. Calcium 600mg  and vit d 400 units take 2 once a day     cholecalciferol (VITAMIN D ) 1000 units tablet Take 1,000 Units by mouth daily.     Cyanocobalamin  (VITAMIN B 12 PO) Take 1,000 mcg by mouth daily.     letrozole  (FEMARA ) 2.5 MG tablet Take 1 tablet (2.5 mg total) by mouth daily. 90 tablet 0   PARAGARD  INTRAUTERINE COPPER  IU 1 each by Intrauterine route once.     tacrolimus  (PROTOPIC ) 0.1 % ointment Apply to affected areas twice daily as needed. 60 g 0   No current facility-administered medications on file prior to visit.    Review of Systems  Constitutional:  Negative for chills and fever.  Respiratory:  Negative for cough.   Cardiovascular:  Negative for chest pain and palpitations.  Gastrointestinal:  Negative for nausea and vomiting.  Musculoskeletal:  Positive for arthralgias (right elbow).      Objective:    BP 122/78   Pulse 78   Temp 98.1 F (36.7 C) (Oral)   Ht 5' 6 (1.676 m)   Wt 134 lb 3.2 oz (60.9 kg)   LMP  (LMP Unknown)   SpO2 98%   BMI 21.66 kg/m   BP Readings from Last 3 Encounters:  10/09/23 122/78  09/22/23 123/87  07/28/23 127/86   Wt Readings from Last 3 Encounters:  10/09/23 134 lb 3.2 oz (60.9 kg)  10/05/23 138 lb 8 oz (62.8 kg)   09/22/23 143 lb (64.9 kg)    Physical Exam Vitals reviewed.  Constitutional:      Appearance: She is well-developed.  Eyes:     Conjunctiva/sclera: Conjunctivae normal.  Neck:     Thyroid : No thyroid  mass or thyromegaly.  Cardiovascular:     Rate and Rhythm: Normal rate and regular rhythm.     Pulses: Normal pulses.     Heart sounds: Normal heart sounds.  Pulmonary:     Effort: Pulmonary effort is normal.     Breath sounds: Normal breath sounds. No wheezing, rhonchi or rales.  Chest:  Breasts:    Breasts are symmetrical.     Right: No inverted nipple, mass, nipple discharge, skin change or tenderness.     Left: No inverted nipple, mass, nipple discharge, skin change or tenderness.  Musculoskeletal:     Right elbow: No swelling. Normal range of motion. Tenderness (right lateral olecranon) present.     Cervical back: No torticollis. Muscular tenderness (muscle spasm trapezius) present. No pain with movement or spinous process tenderness.     Comments: Right elbow Full ROM with flexion, extension, supination, and pronation.  Strength 5/5. No ecchymosis or swelling.   Lymphadenopathy:     Head:     Right side of head: No submental, submandibular, tonsillar, preauricular, posterior auricular or occipital adenopathy.     Left side of head: No submental, submandibular, tonsillar, preauricular, posterior auricular or occipital adenopathy.     Cervical: No cervical adenopathy.     Right cervical: No superficial, deep or posterior cervical adenopathy.    Left cervical: No superficial, deep or posterior cervical adenopathy.  Skin:    General: Skin is warm and dry.  Neurological:     Mental Status: She is alert.  Psychiatric:        Speech: Speech normal.        Behavior: Behavior normal.        Thought Content: Thought content normal.

## 2023-10-09 NOTE — Patient Instructions (Addendum)
 Ice ice ice to right elbow  Mobic  for 7-14 days WITH FOOD as we discussed  A couple of points in regards to meloxicam  ( Mobic ) -  This medication is not intended for daily , long term use. It is a potent anti inflammatory ( NSAID), and my intention is for you take as needed for moderate to severe pain. If you find yourself using daily, please let me know.   Please takes Mobic  ( meloxicam ) with FOOD since it is an anti-inflammatory as it can cause a GI bleed or ulcer. If you have a history of GI bleed or ulcer, please do NOT take.  Do no take over the counter aleve, motrin, advil, goody's powder for pain as they are also NSAIDs, and they are  in the same class as Mobic   Lastly, we will need to monitor kidney function while on Mobic , and if we were to see any decline in kidney function in the future, we would have to discontinue this medication.     Flexeril  at bedtime for upper back and shoulder  Do not drive or operate heavy machinery while on muscle relaxant. Please do not drink alcohol. Only take this medication as needed for acute muscle spasm at bedtime. This medication make you feel drowsy so be very careful.  Stop taking if become too drowsy or somnolent as this puts you at risk for falls. Please contact our office with any questions.   Referral to dr claudene Let us  know if you dont hear back within 2 weeks in regards to an appointment being scheduled.   So that you are aware, if you are [facility] MyChart user , please pay attention to your MyChart messages as you may receive a MyChart message with a phone number to call and schedule this test/appointment own your own from our referral coordinator. This is a new process so I do not want you to miss this message.  If you are not a MyChart user, you will receive a phone call.    Health Maintenance for Postmenopausal Women Menopause is a normal process in which your ability to get pregnant comes to an end. This process happens slowly over many  months or years, usually between the ages of 89 and 30. Menopause is complete when you have missed your menstrual period for 12 months. It is important to talk with your health care provider about some of the most common conditions that affect women after menopause (postmenopausal women). These include heart disease, cancer, and bone loss (osteoporosis). Adopting a healthy lifestyle and getting preventive care can help to promote your health and wellness. The actions you take can also lower your chances of developing some of these common conditions. What are the signs and symptoms of menopause? During menopause, you may have the following symptoms: Hot flashes. These can be moderate or severe. Night sweats. Decrease in sex drive. Mood swings. Headaches. Tiredness (fatigue). Irritability. Memory problems. Problems falling asleep or staying asleep. Talk with your health care provider about treatment options for your symptoms. Do I need hormone replacement therapy? Hormone replacement therapy is effective in treating symptoms that are caused by menopause, such as hot flashes and night sweats. Hormone replacement carries certain risks, especially as you become older. If you are thinking about using estrogen or estrogen with progestin, discuss the benefits and risks with your health care provider. How can I reduce my risk for heart disease and stroke? The risk of heart disease, heart attack, and stroke increases as you  age. One of the causes may be a change in the body's hormones during menopause. This can affect how your body uses dietary fats, triglycerides, and cholesterol. Heart attack and stroke are medical emergencies. There are many things that you can do to help prevent heart disease and stroke. Watch your blood pressure High blood pressure causes heart disease and increases the risk of stroke. This is more likely to develop in people who have high blood pressure readings or are  overweight. Have your blood pressure checked: Every 3-5 years if you are 57-87 years of age. Every year if you are 8 years old or older. Eat a healthy diet  Eat a diet that includes plenty of vegetables, fruits, low-fat dairy products, and lean protein. Do not eat a lot of foods that are high in solid fats, added sugars, or sodium. Get regular exercise Get regular exercise. This is one of the most important things you can do for your health. Most adults should: Try to exercise for at least 150 minutes each week. The exercise should increase your heart rate and make you sweat (moderate-intensity exercise). Try to do strengthening exercises at least twice each week. Do these in addition to the moderate-intensity exercise. Spend less time sitting. Even light physical activity can be beneficial. Other tips Work with your health care provider to achieve or maintain a healthy weight. Do not use any products that contain nicotine or tobacco. These products include cigarettes, chewing tobacco, and vaping devices, such as e-cigarettes. If you need help quitting, ask your health care provider. Know your numbers. Ask your health care provider to check your cholesterol and your blood sugar (glucose). Continue to have your blood tested as directed by your health care provider. Do I need screening for cancer? Depending on your health history and family history, you may need to have cancer screenings at different stages of your life. This may include screening for: Breast cancer. Cervical cancer. Lung cancer. Colorectal cancer. What is my risk for osteoporosis? After menopause, you may be at increased risk for osteoporosis. Osteoporosis is a condition in which bone destruction happens more quickly than new bone creation. To help prevent osteoporosis or the bone fractures that can happen because of osteoporosis, you may take the following actions: If you are 66-6 years old, get at least 1,000 mg of  calcium and at least 600 international units (IU) of vitamin D  per day. If you are older than age 55 but younger than age 44, get at least 1,200 mg of calcium and at least 600 international units (IU) of vitamin D  per day. If you are older than age 2, get at least 1,200 mg of calcium and at least 800 international units (IU) of vitamin D  per day. Smoking and drinking excessive alcohol increase the risk of osteoporosis. Eat foods that are rich in calcium and vitamin D , and do weight-bearing exercises several times each week as directed by your health care provider. How does menopause affect my mental health? Depression may occur at any age, but it is more common as you become older. Common symptoms of depression include: Feeling depressed. Changes in sleep patterns. Changes in appetite or eating patterns. Feeling an overall lack of motivation or enjoyment of activities that you previously enjoyed. Frequent crying spells. Talk with your health care provider if you think that you are experiencing any of these symptoms. General instructions See your health care provider for regular wellness exams and vaccines. This may include: Scheduling regular health, dental, and  eye exams. Getting and maintaining your vaccines. These include: Influenza vaccine. Get this vaccine each year before the flu season begins. Pneumonia vaccine. Shingles vaccine. Tetanus, diphtheria, and pertussis (Tdap) booster vaccine. Your health care provider may also recommend other immunizations. Tell your health care provider if you have ever been abused or do not feel safe at home. Summary Menopause is a normal process in which your ability to get pregnant comes to an end. This condition causes hot flashes, night sweats, decreased interest in sex, mood swings, headaches, or lack of sleep. Treatment for this condition may include hormone replacement therapy. Take actions to keep yourself healthy, including exercising  regularly, eating a healthy diet, watching your weight, and checking your blood pressure and blood sugar levels. Get screened for cancer and depression. Make sure that you are up to date with all your vaccines. This information is not intended to replace advice given to you by your health care provider. Make sure you discuss any questions you have with your health care provider. Document Revised: 06/18/2020 Document Reviewed: 06/18/2020 Elsevier Patient Education  2024 [employer].

## 2023-10-10 LAB — HEPATITIS C ANTIBODY: Hepatitis C Ab: NONREACTIVE

## 2023-10-11 ENCOUNTER — Encounter: Payer: Self-pay | Admitting: Family

## 2023-10-13 ENCOUNTER — Ambulatory Visit: Payer: Self-pay | Admitting: Family

## 2023-10-14 NOTE — Assessment & Plan Note (Signed)
 Chronic, stable.  Continue propranolol  20 mg once daily as also helpful for anxiety.

## 2023-10-14 NOTE — Assessment & Plan Note (Signed)
 Diagnostic mammogram is up-to-date.  Deferred pelvic exam in the absence complaints and she is following with GYN.  Pap smear is up-to-date.  Bone density is scheduled.  Congratulated patient on diligence to exercise.

## 2023-10-14 NOTE — Assessment & Plan Note (Addendum)
 Patient is consistent with lateral epicondylitis.  Pending x-ray.  Discussed icing regimen, short course of meloxicam .  Will plan on orthopedic consult if pain does not resolve.  Of note, Provided prn Flexeril  to be used as needed for upper shoulder back pain.

## 2023-10-18 ENCOUNTER — Other Ambulatory Visit: Payer: Self-pay

## 2023-10-18 ENCOUNTER — Other Ambulatory Visit: Payer: Self-pay | Admitting: Oncology

## 2023-10-18 DIAGNOSIS — C50311 Malignant neoplasm of lower-inner quadrant of right female breast: Secondary | ICD-10-CM

## 2023-10-19 ENCOUNTER — Encounter: Payer: Self-pay | Admitting: Oncology

## 2023-10-19 ENCOUNTER — Other Ambulatory Visit: Payer: Self-pay

## 2023-10-19 MED ORDER — LETROZOLE 2.5 MG PO TABS
2.5000 mg | ORAL_TABLET | Freq: Every day | ORAL | 0 refills | Status: DC
  Filled 2023-10-19: qty 90, 90d supply, fill #0

## 2023-10-23 NOTE — Progress Notes (Signed)
 Ben Kahlee Metivier D.[name] [address] [place] [address] Phone: [phone]   Assessment and Plan:     1. Right elbow pain (Primary) 2. Lateral epicondylitis of right elbow -Chronic with exacerbation, initial visit - Most consistent with lateral epicondylitis of right elbow likely due to physical activity - Continue complete 3-week course of meloxicam  15 mg daily - Start HEP for tennis elbow - Recommend using tennis elbow strap when physically active  15 additional minutes spent for educating Therapeutic Home Exercise Program.  This included exercises focusing on stretching, strengthening, with focus on eccentric aspects.   Long term goals include an improvement in range of motion, strength, endurance as well as avoiding reinjury. Patient's frequency would include in 1-2 times a day, 3-5 times a week for a duration of 6-12 weeks. Proper technique shown and discussed handout in great detail with ATC.  All questions were discussed and answered.     Pertinent previous records reviewed include none   Follow Up: As needed if no improvement in 3 to 4 weeks.  Could consider physical therapy versus nitrogen patch versus wrist brace   Subjective:   I, Claretha Schimke am a scribe for Dr. Leonce.    Chief Complaint: right elbow pain  HPI:   10/26/2023 Patient is a 48 year old female with right elbow pain. Patient states that she can do everything with her arm but everything causes pain. Pain doesn't wake her up at night. Every since lumpectomy the right arm has been weak. Patient states that a recent study showed that there isn't any fluid in the arm.   Duration? Greater than 3 months Did you have an Injury to cause this pain? no Taking Medication for pain? Meloxicam  (2 weeks) Numbness or Tingling? no Does the pain Radiate? no Altered gait or use?no ROM/ impairment of movement?yes    Relevant Historical Information: Right lumpectomy 2023,  Cording on right side 2024, Cancer  Additional pertinent review of systems negative.   Current Outpatient Medications:    Calcium Carb-Cholecalciferol (CALCIUM + VITAMIN D3 PO), Take by mouth. Calcium 600mg  and vit d 400 units take 2 once a day, Disp: , Rfl:    cholecalciferol (VITAMIN D ) 1000 units tablet, Take 1,000 Units by mouth daily., Disp: , Rfl:    Cyanocobalamin  (VITAMIN B 12 PO), Take 1,000 mcg by mouth daily., Disp: , Rfl:    cyclobenzaprine  (FLEXERIL ) 5 MG tablet, Take 1-2 tablets (5-10 mg total) by mouth at bedtime as needed for muscle spasms., Disp: 30 tablet, Rfl: 1   letrozole  (FEMARA ) 2.5 MG tablet, Take 1 tablet (2.5 mg total) by mouth daily., Disp: 90 tablet, Rfl: 0   meloxicam  (MOBIC ) 7.5 MG tablet, Take 1 tablet (7.5 mg total) by mouth daily as needed for pain., Disp: 30 tablet, Rfl: 1   PARAGARD  INTRAUTERINE COPPER  IU, 1 each by Intrauterine route once., Disp: , Rfl:    propranolol  (INDERAL ) 20 MG tablet, Take 1 tablet (20 mg total) by mouth daily., Disp: 90 tablet, Rfl: 3   tacrolimus  (PROTOPIC ) 0.1 % ointment, Apply to affected areas twice daily as needed., Disp: 60 g, Rfl: 0   Objective:     Vitals:   10/26/23 0803  BP: 132/70  Pulse: 72  SpO2: 100%  Weight: 137 lb 12.8 oz (62.5 kg)  Height: 5' 6 (1.676 m)      Body mass index is 22.24 kg/m.    Physical Exam:    General: Appears well,  no acute distress, nontoxic and pleasant Neck: FROM, no pain Neuro: sensation is intact distally with no deficits, strenghth is 5/5 in elbow flexors/extenders/supinator/pronators and wrist flexors/extensors Psych: no evidence of anxiety or depression  Right elbow: No deformity, swelling or muscle wasting Normal Carrying angle ROM:0-140, supination and pronation 90 TTP lateral epicondyle, supinator NTTP over triceps, ticeps tendon, olecranon,  medial epicondyle, antecubital fossa, biceps tendon,  , pronator Negative tinnels over cubital tunnel No pain with resisted  wrist and middle digit extension No pain with resisted wrist flexion   pain with resisted supination along lateral elbow   pain with resisted pronation along lateral elbow Negative valgus stress Negative varus stress Negative milking maneuver    Electronically signed by:  [name] D.[name] 8:23 AM 10/26/23

## 2023-10-26 ENCOUNTER — Ambulatory Visit (INDEPENDENT_AMBULATORY_CARE_PROVIDER_SITE_OTHER): Admitting: Sports Medicine

## 2023-10-26 VITALS — BP 132/70 | HR 72 | Ht 66.0 in | Wt 137.8 lb

## 2023-10-26 DIAGNOSIS — M25521 Pain in right elbow: Secondary | ICD-10-CM | POA: Diagnosis not present

## 2023-10-26 DIAGNOSIS — M7711 Lateral epicondylitis, right elbow: Secondary | ICD-10-CM

## 2023-10-26 NOTE — Patient Instructions (Signed)
 Continue meloxciam for 1 additional week. Recommend tennis elbow strap from [employer] or at a store. Tennis elbow HEP. As needed follow up. If no improvement return in 3 to 4 weeks.

## 2023-10-29 DIAGNOSIS — H5213 Myopia, bilateral: Secondary | ICD-10-CM | POA: Diagnosis not present

## 2023-10-29 DIAGNOSIS — H524 Presbyopia: Secondary | ICD-10-CM | POA: Diagnosis not present

## 2023-11-10 DIAGNOSIS — Z85828 Personal history of other malignant neoplasm of skin: Secondary | ICD-10-CM | POA: Diagnosis not present

## 2023-11-10 DIAGNOSIS — L821 Other seborrheic keratosis: Secondary | ICD-10-CM | POA: Diagnosis not present

## 2023-11-10 DIAGNOSIS — D225 Melanocytic nevi of trunk: Secondary | ICD-10-CM | POA: Diagnosis not present

## 2023-11-10 DIAGNOSIS — D2272 Melanocytic nevi of left lower limb, including hip: Secondary | ICD-10-CM | POA: Diagnosis not present

## 2023-11-10 DIAGNOSIS — D2261 Melanocytic nevi of right upper limb, including shoulder: Secondary | ICD-10-CM | POA: Diagnosis not present

## 2023-11-10 DIAGNOSIS — D2262 Melanocytic nevi of left upper limb, including shoulder: Secondary | ICD-10-CM | POA: Diagnosis not present

## 2023-11-30 ENCOUNTER — Other Ambulatory Visit: Payer: Self-pay

## 2023-12-28 ENCOUNTER — Ambulatory Visit: Admitting: Sports Medicine

## 2024-01-16 ENCOUNTER — Other Ambulatory Visit: Payer: Self-pay | Admitting: Oncology

## 2024-01-16 DIAGNOSIS — Z170 Estrogen receptor positive status [ER+]: Secondary | ICD-10-CM

## 2024-01-18 ENCOUNTER — Other Ambulatory Visit: Payer: Self-pay

## 2024-01-18 ENCOUNTER — Encounter: Payer: Self-pay | Admitting: Oncology

## 2024-01-18 MED ORDER — LETROZOLE 2.5 MG PO TABS
2.5000 mg | ORAL_TABLET | Freq: Every day | ORAL | 0 refills | Status: AC
  Filled 2024-01-18: qty 90, 90d supply, fill #0

## 2024-01-22 NOTE — Progress Notes (Signed)
 Adrienne Harris [facility] [address] [place] [address] Phone: [phone]   Assessment and Plan:     1. Right elbow pain 2. Lateral epicondylitis of right elbow (Primary) -Chronic with exacerbation, subsequent visit - Still consistent with recurrent lateral epicondylitis of right elbow likely due to physical activity - Patient completed 3-week course of meloxicam  without significant resolution of symptoms. Use meloxicam  15 mg daily as needed for breakthrough pain.  Recommend limiting chronic NSAIDs to 1-2 doses per week to prevent long-term side effects. Use Tylenol  500 to 1000 mg tablets 2-3 times a day as needed for day-to-day pain relief.    -Continue HEP and start physical therapy for lateral epicondylitis - Continue to use tennis elbow strap when physically active - Recommend using wrist brace without thumb spica over the next 4 weeks during the day to decrease strain over lateral elbow - Start nitrogen patch, one fourth patch topically over lateral epicondyle every 24 hours for 1 month.  If headache presents, discontinue medication  Pertinent previous records reviewed include none   Follow Up: 6 weeks for reevaluation.  If no improvement or worsening of symptoms, could discuss PRP versus needling versus ECSWT   Subjective:   I, Adrienne Harris, am serving as a neurosurgeon for [name]  Chief Complaint: right elbow pain   HPI:    10/26/2023 Patient is a 48 year old female with right elbow pain. Patient states that she can do everything with her arm but everything causes pain. Pain doesn't wake her up at night. Every since lumpectomy the right arm has been weak. Patient states that a recent study showed that there isn't any fluid in the arm.    Duration? Greater than 3 months Did you have an Injury to cause this pain? no Taking Medication for pain? Meloxicam  (2 weeks) Numbness or Tingling? no Does the pain Radiate?  no Altered gait or use?no ROM/ impairment of movement?yes   01/25/2024 Patient states she still has pain . Hx lymphedema was hesitant with elbow band. Is interested in PT     Relevant Historical Information: Right lumpectomy 2023, Cording on right side 2024, Cancer  Additional pertinent review of systems negative.  Current Medications[1]   Objective:     Vitals:   01/25/24 1401  BP: 120/80  Pulse: 94  SpO2: (!) 10%  Weight: 137 lb (62.1 kg)  Height: 5' 6 (1.676 m)      Body mass index is 22.11 kg/m.    Physical Exam:    General: Appears well, no acute distress, nontoxic and pleasant Neck: FROM, no pain Neuro: sensation is intact distally with no deficits, strenghth is 5/5 in elbow flexors/extenders/supinator/pronators and wrist flexors/extensors Psych: no evidence of anxiety or depression   Right elbow: No deformity, swelling or muscle wasting Normal Carrying angle ROM:0-140, supination and pronation 90 TTP lateral epicondyle, supinator NTTP over triceps, ticeps tendon, olecranon,  medial epicondyle, antecubital fossa, biceps tendon,  , pronator Negative tinnels over cubital tunnel No pain with resisted wrist and middle digit extension No pain with resisted wrist flexion   pain with resisted supination along lateral elbow   pain with resisted pronation along lateral elbow Negative valgus stress Negative varus stress Negative milking maneuver     Electronically signed by:  [name] [facility] 2:24 PM 01/25/2024     [1]  Current Outpatient Medications:    nitroGLYCERIN  (NITRODUR - DOSED IN MG/24 HR) 0.2 mg/hr patch,  Place 1 patch (0.2 mg total) onto the skin daily. 1/4 patch onto skin over painful area, Disp: 30 patch, Rfl: 1   Calcium Carb-Cholecalciferol (CALCIUM + VITAMIN D3 PO), Take by mouth. Calcium 600mg  and vit d 400 units take 2 once a day, Disp: , Rfl:    cholecalciferol (VITAMIN D ) 1000 units tablet, Take 1,000 Units  by mouth daily., Disp: , Rfl:    Cyanocobalamin  (VITAMIN B 12 PO), Take 1,000 mcg by mouth daily., Disp: , Rfl:    cyclobenzaprine  (FLEXERIL ) 5 MG tablet, Take 1-2 tablets (5-10 mg total) by mouth at bedtime as needed for muscle spasms., Disp: 30 tablet, Rfl: 1   letrozole  (FEMARA ) 2.5 MG tablet, Take 1 tablet (2.5 mg total) by mouth daily., Disp: 90 tablet, Rfl: 0   meloxicam  (MOBIC ) 7.5 MG tablet, Take 1 tablet (7.5 mg total) by mouth daily as needed for pain., Disp: 30 tablet, Rfl: 1   PARAGARD  INTRAUTERINE COPPER  IU, 1 each by Intrauterine route once., Disp: , Rfl:    propranolol  (INDERAL ) 20 MG tablet, Take 1 tablet (20 mg total) by mouth daily., Disp: 90 tablet, Rfl: 3   tacrolimus  (PROTOPIC ) 0.1 % ointment, Apply to affected areas twice daily as needed., Disp: 60 g, Rfl: 0

## 2024-01-25 ENCOUNTER — Other Ambulatory Visit: Payer: Self-pay

## 2024-01-25 ENCOUNTER — Ambulatory Visit: Admitting: Sports Medicine

## 2024-01-25 VITALS — BP 120/80 | HR 94 | Ht 66.0 in | Wt 137.0 lb

## 2024-01-25 DIAGNOSIS — M7711 Lateral epicondylitis, right elbow: Secondary | ICD-10-CM | POA: Diagnosis not present

## 2024-01-25 DIAGNOSIS — M25521 Pain in right elbow: Secondary | ICD-10-CM

## 2024-01-25 MED ORDER — MELOXICAM 7.5 MG PO TABS
7.5000 mg | ORAL_TABLET | Freq: Every day | ORAL | 1 refills | Status: AC | PRN
  Filled 2024-01-25: qty 30, 30d supply, fill #0

## 2024-01-25 MED ORDER — NITROGLYCERIN 0.2 MG/HR TD PT24
0.2000 mg | MEDICATED_PATCH | Freq: Every day | TRANSDERMAL | 1 refills | Status: AC
  Filled 2024-01-25: qty 30, 30d supply, fill #0

## 2024-01-25 NOTE — Patient Instructions (Signed)
 Wrist brace  PT referral   Voltaren  gel over areas of pain   - Use meloxicam  15 mg daily as needed for breakthrough pain.  Recommend limiting chronic NSAIDs to 1-2 doses per week to prevent long-term side effects. Use Tylenol  500 to 1000 mg tablets 2-3 times a day as needed for day-to-day pain relief.    6 week follow up

## 2024-01-26 ENCOUNTER — Other Ambulatory Visit: Payer: Self-pay

## 2024-01-26 NOTE — Progress Notes (Signed)
 OUTPATIENT PHYSICAL THERAPY UPPER EXTREMITY EVALUATION   Patient Name: Adrienne Harris MRN: [id number] DOB:03/18/1975, 48 y.o., female Today's Date: 01/27/2024  END OF SESSION:  PT End of Session - 01/27/24 0932     Visit Number 1    Date for Recertification  03/23/24    Authorization Type cone  [employer]  no auth required    PT Start Time 0933    PT Stop Time 1016    PT Time Calculation (min) 43 min    Activity Tolerance Patient tolerated treatment well    Behavior During Therapy WFL for tasks assessed/performed          Past Medical History:  Diagnosis Date   Atopic dermatitis    Basal cell carcinoma    15 years ago.   BRCA negative 2023   Invitae panel neg at [facility]   Breast cancer in female [facility])    Dysrhythmia    Hemochromatosis    Heterozygous for two genes   Palpitations    a. 04/2013 48h Holter: sinus rhythm/sinus arrhythmia, rare PAC's/PVC's.   PVC (premature ventricular contraction)    Shoulder pain, left 01/24/2015   Shoulder pain, right 01/24/2015   Past Surgical History:  Procedure Laterality Date   BREAST BIOPSY Right 09/25/2021   INVASIVE MAMMARY CARCINOMA,   BREAST LUMPECTOMY Right 10/28/2021   INVASIVE MAMMARY CARCINOMA,   BREAST LUMPECTOMY WITH RADIOACTIVE SEED AND SENTINEL LYMPH NODE BIOPSY Right 10/28/2021   Procedure: RIGHT BREAST LUMPECTOMY WITH RADIOACTIVE SEED AND AXILLARY SENTINEL LYMPH NODE BIOPSY;  Surgeon: Ebbie Cough, MD;  Location: [facility];  Service: General;  Laterality: Right;   COLONOSCOPY WITH PROPOFOL  N/A 01/24/2021   Procedure: COLONOSCOPY WITH PROPOFOL ;  Surgeon: Jinny Carmine, MD;  Location: [facility];  Service: Endoscopy;  Laterality: N/A;   Excision of basal cell carcinoma     PORT-A-CATH REMOVAL Left 03/13/2022   Procedure: REMOVAL PORT-A-CATH;  Surgeon: Ebbie Cough, MD;  Location: [facility];  Service: General;  Laterality: Left;   PORTACATH PLACEMENT Left 11/19/2021    Procedure: INSERTION PORT-A-CATH;  Surgeon: Ebbie Cough, MD;  Location: [facility];  Service: General;  Laterality: Left;   WISDOM TOOTH EXTRACTION     Patient Active Problem List   Diagnosis Date Noted   BCC (basal cell carcinoma of skin) 10/09/2023   Right elbow pain 10/09/2023   Use of tamoxifen  (Nolvadex ) 07/24/2022   Lymphedema 07/17/2022   Genetic testing 10/24/2021   Malignant neoplasm of lower-inner quadrant of right breast of female, estrogen receptor positive (HCC) 10/01/2021   Colon cancer screening    Intractable menstrual migraine without status migrainosus 07/30/2020   Nonallopathic lesion of sacral region 12/30/2017   Trigger point of left shoulder region 10/06/2017   Slipped rib syndrome 08/05/2017   Nonallopathic lesion of rib cage 08/05/2017   Nonallopathic lesion of cervical region 08/05/2017   Nonallopathic lesion of thoracic region 08/05/2017   Nonallopathic lesion of lumbosacral region 08/05/2017   Chronic left shoulder pain 07/10/2017   Chronic LLQ pain 05/17/2017   Atopic dermatitis 07/10/2016   Hepatic cyst 07/10/2016   Routine physical examination 07/10/2016   PVC's (premature ventricular contractions) 04/22/2013   Hemochromatosis 04/22/2013    PCP: Dineen Rollene MATSU, FNP   REFERRING PROVIDER:Jackson, Morene DO   REFERRING DIAG: [phone] (ICD-10-CM) - Right elbow pain   THERAPY DIAG:  Pain in right elbow  Cramp and spasm  Muscle weakness (generalized)  Rationale for Evaluation and Treatment: Rehabilitation  ONSET DATE: over 6 months ago   SUBJECTIVE:                                                                                                                                                                                      SUBJECTIVE STATEMENT: Elbow pain started over 6 months ago. Saw MD in September,  completed 3 wk course of meloxicam  doing HEP 3x/wk then pain increased after 3 weeks. Seems triggered by  typing and how I hold my hands. Now using tennis elbow strap and wrist brace. Also doing nitro patch, but hasn't started. The Right arm is weak. Feels like the arm hangs lower and I have less tone in my R pectorals. May have small cord in the R chest. Hand dominance: Right  PERTINENT HISTORY: R breast cancer and lymphedema history.  Patient was diagnosed on 09/25/2021 with right grade 3 invasive mammary carcinoma of no particular type. She underwent a right lumpectomy and sentinel node biopsy on 09/28/2021. It is ER/PR positive and HER2 negative and the Ki67 was not measured.  Scheduled for [facility] in February.   PAIN:  Are you having pain? Yes: NPRS scale: 0 at rest to 3-4/10 with resistance Pain location: R lateral elbow Pain description: sharp and dull Aggravating factors: picking up a cup or lifting a pot, carrying groceries, pro/sup Relieving factors: rest  PRECAUTIONS: Other: R Breast cancer, h/o lymphedema  RED FLAGS: None   WEIGHT BEARING RESTRICTIONS: No  FALLS:  Has patient fallen in last 6 months? 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  DIAGNOSTIC FINDINGS:  XR - negative  PATIENT SURVEYS :  UEFS  Extreme difficulty/unable (0), Quite a bit of difficulty (1), Moderate difficulty (2), Little difficulty (3), No difficulty (4) Survey date:  01/27/24  Any of your usual work, household or school activities 3  2. Your usual hobbies, recreational/sport activities 0   3. Lifting a bag of groceries to waist level 3   4. Lifting a bag of groceries above your head 3  5. Grooming your hair 4  6. Pushing up on your hands (I.e. from bathtub or chair) 3  7. Preparing food (I.e.  peeling/cutting) 3  8. Driving  4  9. Vacuuming, sweeping, or raking 3  10. Dressing  4  11. Doing up buttons 4  12. Using tools/appliances 3  13. Opening doors 4  14. Cleaning  3  15. Tying or lacing shoes 4  16. Sleeping  4  17. Laundering clothes (I.e. washing, ironing, folding) 3  18. Opening a jar 3  19. Throwing a ball 3  20. Carrying a small suitcase with your affected limb.  3  Score total:  64/80     COGNITION: Overall cognitive status: Within functional limits for tasks assessed     SENSATION: WFL  POSTURE: Forward head, rounded shoulders  UPPER EXTREMITY ROM:   Active ROM Right eval Left eval  Elbow extension    Wrist flexion 42/80   Wrist extension 53/85   Wrist ulnar deviation    Wrist radial deviation    Wrist pronation    Wrist supination    (Blank rows = not tested)  UPPER EXTREMITY MMT: mild pain with resisted supination and   MMT Right eval Left eval  Shoulder flexion 5   Shoulder extension 4+   Shoulder abduction 4+   Shoulder adduction    Shoulder internal rotation 4+   Shoulder external rotation 5   Middle trapezius    Lower trapezius    Elbow flexion 5   Elbow extension 5   Wrist flexion 5   Wrist extension 4+   Wrist ulnar deviation 5   Wrist radial deviation 5   Wrist pronation 5   Wrist supination 5   Grip strength (lbs) 49 45  (Blank rows = not tested)     PALPATION:  R ECRB, and common extensor tendon and muscle bellies                                                                                                                             TREATMENT DATE:  01/27/24   See pt ed and HEP  Seated wrist ext isometric in 90 deg elbow flexion and palm down/wrist neutral 45 sec hold 5# x 3 reps with 2 min rest in between reps. (Pt to do 5 reps at home). Stretching for wrist flex/ext done in between.   PATIENT EDUCATION: Education details: PT eval findings, anticipated POC, initial HEP, role of TPDN, and fee schedule for  TPDN and lack of insurance coverage requiring payment at time of service   Person educated: Patient Education method: Explanation, Demonstration, Tactile cues, Verbal cues, and Handouts Education comprehension: verbalized understanding and returned demonstration  HOME EXERCISE PROGRAM: Access Code: [email or web address] URL: [email or web address] Date: 01/27/2024 Prepared by: Mliss  Exercises - Standing Shoulder Flexion to 90 Degrees  - 1 x daily - 3 x weekly - 1 sets - 10 reps - 3 sec hold - Standing Shoulder Scaption  - 1 x daily - 3 x weekly - 1 sets - 10 reps - 3 sec hold - Shoulder Abduction - Thumbs Up  - 1 x daily - 3 x weekly - 1 sets - 10 reps - 3 sec hold  ASSESSMENT:  CLINICAL IMPRESSION: Patient is a 48 y.o. female who was seen today  for physical therapy evaluation and treatment for chronic R elbow pain which began over 6 months ago and has failed treatment so far (see subjective). She presents with full passive, but restricted active wrist ROM, mild strength deficits in her R UE and grip strength and pain with palpation to the lateral epicondyle and extension musculature. She has stopped all strengthening at this time because it seemed to flare her up. Pain is affecting her ability to lift, carry and perform ADLS with wrist resistance. She will benefit from skilled PT to address these deficits and those listed below.    OBJECTIVE IMPAIRMENTS: decreased activity tolerance, decreased ROM, decreased strength, increased muscle spasms, impaired flexibility, impaired UE functional use, and pain.   ACTIVITY LIMITATIONS: carrying and lifting  PARTICIPATION LIMITATIONS: meal prep, cleaning, laundry, driving, shopping, and occupation  PERSONAL FACTORS: Age, Time since onset of injury/illness/exacerbation, and 1 comorbidity: Breast cancer R are also affecting patient's functional outcome.   REHAB POTENTIAL: Excellent  CLINICAL DECISION MAKING: Stable/uncomplicated  EVALUATION  COMPLEXITY: Moderate  GOALS: Goals reviewed with patient? Yes  SHORT TERM GOALS: Target date: 02/24/24  Pain report decreased pain by 50% with ADLs  Baseline: Goal status: INITIAL  2.  Patient will be independent with initial HEP  Baseline:  Goal status: INITIAL    LONG TERM GOALS: Target date: 03/23/24  Patient to report pain no greater than 2/10 with ADLs including picking up a cup or lifting a pot, carrying groceries and supination of the forearm. Baseline:  Goal status: INITIAL  2.  Patient to be independent with advanced HEP  Baseline:  Goal status: INITIAL  3.   UEFS score to improve by 9 points showing functional improvement. Baseline: 64/80 Goal status: INITIAL   PLAN: PT FREQUENCY: 2x/week  PT DURATION: 8 weeks  PLANNED INTERVENTIONS: 97110-Therapeutic exercises, 97530- Therapeutic activity, [id number]- Neuromuscular re-education, 97535- Self Care, 02859- Manual therapy, [id number]- Electrical stimulation (unattended), 97035- Ultrasound, 02966- Ionotophoresis 4mg /ml Dexamethasone , 79439 (1-2 muscles), 20561 (3+ muscles)- Dry Needling, Patient/Family education, Taping, Joint mobilization, Cryotherapy, and Moist heat  PLAN FOR NEXT SESSION: Assess response to isometrics, progress as tolerated with isometric + elbow extension, supinated rows, loaded carries, eccentric pronation, concentric supination and wrist ext. Possible DN and/or ionto.   Mliss Cummins, PT 01/27/2024 1:24 PM

## 2024-01-27 ENCOUNTER — Ambulatory Visit: Attending: General Surgery | Admitting: Physical Therapy

## 2024-01-27 ENCOUNTER — Other Ambulatory Visit: Payer: Self-pay

## 2024-01-27 ENCOUNTER — Encounter: Payer: Self-pay | Admitting: Physical Therapy

## 2024-01-27 DIAGNOSIS — M25521 Pain in right elbow: Secondary | ICD-10-CM | POA: Diagnosis present

## 2024-01-27 DIAGNOSIS — M6281 Muscle weakness (generalized): Secondary | ICD-10-CM | POA: Diagnosis present

## 2024-01-27 DIAGNOSIS — R252 Cramp and spasm: Secondary | ICD-10-CM | POA: Diagnosis present

## 2024-02-01 ENCOUNTER — Other Ambulatory Visit

## 2024-02-02 ENCOUNTER — Ambulatory Visit
Admission: RE | Admit: 2024-02-02 | Discharge: 2024-02-02 | Disposition: A | Discharge status: Home / Self Care | Source: Ambulatory Visit | Attending: Oncology | Admitting: Oncology

## 2024-02-02 DIAGNOSIS — Z5181 Encounter for therapeutic drug level monitoring: Secondary | ICD-10-CM | POA: Insufficient documentation

## 2024-02-02 DIAGNOSIS — C50311 Malignant neoplasm of lower-inner quadrant of right female breast: Secondary | ICD-10-CM | POA: Insufficient documentation

## 2024-02-02 DIAGNOSIS — Z7983 Long term (current) use of bisphosphonates: Secondary | ICD-10-CM | POA: Diagnosis not present

## 2024-02-02 DIAGNOSIS — Z170 Estrogen receptor positive status [ER+]: Secondary | ICD-10-CM | POA: Insufficient documentation

## 2024-02-22 ENCOUNTER — Ambulatory Visit: Attending: General Surgery

## 2024-02-22 DIAGNOSIS — R252 Cramp and spasm: Secondary | ICD-10-CM | POA: Insufficient documentation

## 2024-02-22 DIAGNOSIS — M6281 Muscle weakness (generalized): Secondary | ICD-10-CM | POA: Diagnosis present

## 2024-02-22 DIAGNOSIS — M25521 Pain in right elbow: Secondary | ICD-10-CM | POA: Insufficient documentation

## 2024-02-22 NOTE — Therapy (Signed)
 OUTPATIENT PHYSICAL THERAPY UPPER EXTREMITY EVALUATION     Patient Name: Adrienne Harris MRN: [id number] DOB:Oct 26, 1975, 49 y.o., female Today's Date: 01/27/2024    PT End of Session - 02/22/24 0840     Visit Number 2    Date for Recertification  03/23/24    Authorization Type cone  [employer]  no auth required    PT Start Time 0758    PT Stop Time 0838    PT Time Calculation (min) 40 min    Activity Tolerance Patient tolerated treatment well    Behavior During Therapy [facility] for tasks assessed/performed                  Past Medical History:  Diagnosis Date   Atopic dermatitis     Basal cell carcinoma      15 years ago.   BRCA negative 2023    Invitae panel neg at [facility]   Breast cancer in female [facility])     Dysrhythmia     Hemochromatosis      Heterozygous for two genes   Palpitations      a. 04/2013 48h Holter: sinus rhythm/sinus arrhythmia, rare PAC's/PVC's.   PVC (premature ventricular contraction)     Shoulder pain, left 01/24/2015   Shoulder pain, right 01/24/2015             Past Surgical History:  Procedure Laterality Date   BREAST BIOPSY Right 09/25/2021    INVASIVE MAMMARY CARCINOMA,   BREAST LUMPECTOMY Right 10/28/2021    INVASIVE MAMMARY CARCINOMA,   BREAST LUMPECTOMY WITH RADIOACTIVE SEED AND SENTINEL LYMPH NODE BIOPSY Right 10/28/2021    Procedure: RIGHT BREAST LUMPECTOMY WITH RADIOACTIVE SEED AND AXILLARY SENTINEL LYMPH NODE BIOPSY;  Surgeon: Ebbie Cough, MD;  Location: [facility];  Service: General;  Laterality: Right;   COLONOSCOPY WITH PROPOFOL  N/A 01/24/2021    Procedure: COLONOSCOPY WITH PROPOFOL ;  Surgeon: Jinny Carmine, MD;  Location: [facility];  Service: Endoscopy;  Laterality: N/A;   Excision of basal cell carcinoma       PORT-A-CATH REMOVAL Left 03/13/2022    Procedure: REMOVAL PORT-A-CATH;  Surgeon: Ebbie Cough, MD;  Location: [facility];  Service: General;  Laterality: Left;   PORTACATH  PLACEMENT Left 11/19/2021    Procedure: INSERTION PORT-A-CATH;  Surgeon: Ebbie Cough, MD;  Location:  SURGERY CENTER;  Service: General;  Laterality: Left;   WISDOM TOOTH EXTRACTION                Patient Active Problem List    Diagnosis Date Noted   BCC (basal cell carcinoma of skin) 10/09/2023   Right elbow pain 10/09/2023   Use of tamoxifen  (Nolvadex ) 07/24/2022   Lymphedema 07/17/2022   Genetic testing 10/24/2021   Malignant neoplasm of lower-inner quadrant of right breast of female, estrogen receptor positive (HCC) 10/01/2021   Colon cancer screening     Intractable menstrual migraine without status migrainosus 07/30/2020   Nonallopathic lesion of sacral region 12/30/2017   Trigger point of left shoulder region 10/06/2017   Slipped rib syndrome 08/05/2017   Nonallopathic lesion of rib cage 08/05/2017   Nonallopathic lesion of cervical region 08/05/2017   Nonallopathic lesion of thoracic region 08/05/2017   Nonallopathic lesion of lumbosacral region 08/05/2017   Chronic left shoulder pain 07/10/2017   Chronic LLQ pain 05/17/2017   Atopic dermatitis 07/10/2016   Hepatic cyst 07/10/2016   Routine physical examination 07/10/2016   PVC's (premature ventricular contractions) 04/22/2013   Hemochromatosis  04/22/2013      PCP: Dineen Rollene MATSU, FNP     REFERRING PROVIDER:Jackson, Morene DO     REFERRING DIAG: [phone] (ICD-10-CM) - Right elbow pain    THERAPY DIAG:  Pain in right elbow   Cramp and spasm   Muscle weakness (generalized)   Rationale for Evaluation and Treatment: Rehabilitation   ONSET DATE: over 6 months ago    SUBJECTIVE:                                                                                                                                                                                       SUBJECTIVE STATEMENT: I've been doing my exercises.  Pain is about the same.    Elbow pain started over 6 months ago. Saw MD in  September,  completed 3 wk course of meloxicam  doing HEP 3x/wk then pain increased after 3 weeks. Seems triggered by typing and how I hold my hands. Now using tennis elbow strap and wrist brace. Also doing nitro patch, but hasn't started. The Right arm is weak. Feels like the arm hangs lower and I have less tone in my R pectorals. May have small cord in the R chest. Hand dominance: Right   PERTINENT HISTORY: R breast cancer and lymphedema history.  Patient was diagnosed on 09/25/2021 with right grade 3 invasive mammary carcinoma of no particular type. She underwent a right lumpectomy and sentinel node biopsy on 09/28/2021. It is ER/PR positive and HER2 negative and the Ki67 was not measured.  Scheduled for [facility] in February.    PAIN: 02/22/24 Are you having pain? Yes: NPRS scale: 0 at rest to 3-4/10 with resistance Pain location: R lateral elbow Pain description: sharp and dull Aggravating factors: picking up a cup or lifting a pot, carrying groceries, pro/sup Relieving factors: rest   PRECAUTIONS: Other: R Breast cancer, h/o lymphedema   RED FLAGS: None       WEIGHT BEARING RESTRICTIONS: No   FALLS:  Has patient fallen in last 6 months? 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   DIAGNOSTIC FINDINGS:  XR - negative   PATIENT SURVEYS :  UEFS  Extreme difficulty/unable (0), Quite a bit of difficulty (1), Moderate difficulty (2), Little difficulty (3), No difficulty (4) Survey date:  01/27/24  Any of your usual work, household or school  activities 3  2. Your usual hobbies, recreational/sport activities 0   3. Lifting a bag of groceries to waist level 3   4. Lifting a bag of  groceries above your head 3  5. Grooming your hair 4  6. Pushing up on your hands (I.e. from bathtub or chair) 3  7. Preparing food (I.e. peeling/cutting) 3  8. Driving  4  9. Vacuuming, sweeping, or raking 3  10. Dressing  4  11. Doing up buttons 4  12. Using tools/appliances 3  13. Opening doors 4  14. Cleaning  3  15. Tying or lacing shoes 4  16. Sleeping  4  17. Laundering clothes (I.e. washing, ironing, folding) 3  18. Opening a jar 3  19. Throwing a ball 3  20. Carrying a small suitcase with your affected limb.  3  Score total:  64/80      COGNITION: Overall cognitive status: Within functional limits for tasks assessed                                     SENSATION: WFL   POSTURE: Forward head, rounded shoulders   UPPER EXTREMITY ROM:    Active ROM Right eval Left eval  Elbow extension      Wrist flexion 42/80    Wrist extension 53/85    Wrist ulnar deviation      Wrist radial deviation      Wrist pronation      Wrist supination      (Blank rows = not tested)   UPPER EXTREMITY MMT: mild pain with resisted supination and    MMT Right eval Left eval  Shoulder flexion 5    Shoulder extension 4+    Shoulder abduction 4+    Shoulder adduction      Shoulder internal rotation 4+    Shoulder external rotation 5    Middle trapezius      Lower trapezius      Elbow flexion 5    Elbow extension 5    Wrist flexion 5    Wrist extension 4+    Wrist ulnar deviation 5    Wrist radial deviation 5    Wrist pronation 5    Wrist supination 5    Grip strength (lbs) 49 45  (Blank rows = not tested)         PALPATION:  R ECRB, and common extensor tendon and muscle bellies                                                                                                                                        TREATMENT DATE:  02/22/24:  3 way raises: 4# 2x10 Wrist extension with supported arm 2# 2x10 Supination pronation 2# 2x10 Low row with green band  2x10 Isometric wrist extension with 7# weight  2x 45 seconds with 2 min rest break Manual: instructed pt in gentle tissue mobilization and cross friction at proximal wrist extensors  Ionto #1: 2cc dex to Rt lateral epicondyle x4 hour wear time- education provided    Stretching for wrist flex/ext done in between.    01/27/24   See pt ed and HEP  Seated wrist ext isometric in 90 deg elbow flexion and palm down/wrist neutral 45 sec hold 5# x 3 reps with 2 min rest in between reps. (Pt to do 5 reps at home). Stretching for wrist flex/ext done in between.    PATIENT EDUCATION: Education details: PT eval findings, anticipated POC, initial HEP, role of TPDN, and fee schedule for TPDN and lack of insurance coverage requiring payment at time of service  Ionto instructions (02/22/24)   Person educated: Patient Education method: Explanation, Demonstration, Tactile cues, Verbal cues, and Handouts Education comprehension: verbalized understanding and returned demonstration   HOME EXERCISE PROGRAM: Access Code: [email or web address] URL: [email or web address] Date: 02/22/2024 Prepared by: Burnard  Exercises - Standing Shoulder Flexion to 90 Degrees  - 1 x daily - 3 x weekly - 1 sets - 10 reps - 3 sec hold - Standing Shoulder Scaption  - 1 x daily - 3 x weekly - 1 sets - 10 reps - 3 sec hold - Shoulder Abduction - Thumbs Up  - 1 x daily - 3 x weekly - 1 sets - 10 reps - 3 sec hold - Wrist Extension with Dumbbell  - 1 x daily - 7 x weekly - 2 sets - 10 reps - Wrist Flexion with Dumbbell  - 1 x daily - 7 x weekly - 3 sets - 10 reps - Forearm Supination with Dumbbell  - 1 x daily - 7 x weekly - 2 sets - 10 reps - Standing Bilateral Low Shoulder Row with Anchored Resistance  - 1 x daily - 7 x weekly - 2 sets - 10 reps   ASSESSMENT:   CLINICAL IMPRESSION: First time follow-up after evaluation.  Pt is independent and compliant with HEP.  She tolerated all advancement of exercises well today without  increased pain.  Educated pt in gentle tissue mobility and discussed dry needling for future sessions.  Pain is affecting her ability to lift, carry and perform ADLS with wrist resistance. She will benefit from skilled PT to address these deficits and those listed below.      OBJECTIVE IMPAIRMENTS: decreased activity tolerance, decreased ROM, decreased strength, increased muscle spasms, impaired flexibility, impaired UE functional use, and pain.    ACTIVITY LIMITATIONS: carrying and lifting   PARTICIPATION LIMITATIONS: meal prep, cleaning, laundry, driving, shopping, and occupation   PERSONAL FACTORS: Age, Time since onset of injury/illness/exacerbation, and 1 comorbidity: Breast cancer R are also affecting patient's functional outcome.    REHAB POTENTIAL: Excellent   CLINICAL DECISION MAKING: Stable/uncomplicated   EVALUATION COMPLEXITY: Moderate   GOALS: Goals reviewed with patient? Yes   SHORT TERM GOALS: Target date: 02/24/24   Pain report decreased pain by 50% with ADLs  Baseline: Goal status: INITIAL   2.  Patient will be independent with initial HEP  Baseline:  Goal status: In progress        LONG TERM GOALS: Target date: 03/23/24   Patient to report pain no greater than 2/10 with ADLs including picking up a cup or lifting a pot, carrying groceries and supination of the forearm. Baseline:  Goal status: INITIAL   2.  Patient to be independent with advanced HEP  Baseline:  Goal status: INITIAL   3.   UEFS score to improve by 9 points showing functional improvement. Baseline: 64/80 Goal status: INITIAL     PLAN: PT FREQUENCY: 2x/week   PT DURATION: 8 weeks   PLANNED INTERVENTIONS: 97110-Therapeutic exercises, 97530- Therapeutic activity, [id number]- Neuromuscular re-education, 97535- Self Care, 02859- Manual therapy, G0283- Electrical stimulation (unattended), 97035- Ultrasound, 02966- Ionotophoresis 4mg /ml Dexamethasone , 79439 (1-2 muscles), 20561 (3+ muscles)- Dry  Needling, Patient/Family education, Taping, Joint mobilization, Cryotherapy, and Moist heat   PLAN FOR NEXT SESSION: review HEP and add if needed, Possible dry needling, ionto #2  Burnard Joy, PT 02/22/2024 8:45 AM

## 2024-02-22 NOTE — Patient Instructions (Signed)
IONTOPHORESIS PATIENT PRECAUTIONS & CONTRAINDICATIONS: ? ?Redness under one or both electrodes can occur.  This characterized by a uniform redness that usually disappears within 12 hours of treatment. ?Small pinhead size blisters may result in response to the drug.  Contact your physician if the problem persists more than 24 hours. ?On rare occasions, iontophoresis therapy can result in temporary skin reactions such as rash, inflammation, irritation or burns.  The skin reactions may be the result of individual sensitivity to the ionic solution used, the condition of the skin at the start of treatment, reaction to the materials in the electrodes, allergies or sensitivity to dexamethasone, or a poor connection between the patch and your skin.  Discontinue using iontophoresis if you have any of these reactions and report to your therapist. ?Remove the Patch? or electrodes if you have any undue sensation of pain or burning during the treatment and report discomfort to your therapist. ?Tell your Therapist if you have had known adverse reactions to the application of electrical current. ?If using the Patch?, the LED light will turn off when treatment is complete and the patch can be removed.  Approximate treatment time is 1-3 hours.  Remove the patch when light goes off or after 6 hours. ?The Patch? can be worn during normal activity, however excessive motion where the electrodes have been placed can cause poor contact between the skin and the electrode or uneven electrical current resulting in greater risk of skin irritation. ?Keep out of the reach of children. ? ? ?DO NOT use if you have a cardiac pacemaker or any other electrically sensitive implanted device. ?DO NOT use if you have a known sensitivity to dexamethasone. ?DO NOT use during Magnetic Resonance Imaging (MRI). ?DO NOT use over broken or compromised skin (e.g. sunburn, cuts, or acne) due to the increased risk of skin reaction. ?DO NOT SHAVE over the area to  be treated:  To establish good contact between the Patch? and the skin, excessive hair may be clipped. ?DO NOT place the Patch? or electrodes on or over your eyes, directly over your heart, or brain. ?DO NOT reuse the Patch? or electrodes as this may cause burns to occur.  ?Librarian, academic Services ?[address], [address] ?[place], [place] [address] ?Phone # [phone] ?Fax [phone]  ?

## 2024-02-24 ENCOUNTER — Ambulatory Visit

## 2024-02-24 DIAGNOSIS — M25521 Pain in right elbow: Secondary | ICD-10-CM

## 2024-02-24 DIAGNOSIS — R252 Cramp and spasm: Secondary | ICD-10-CM

## 2024-02-24 DIAGNOSIS — M6281 Muscle weakness (generalized): Secondary | ICD-10-CM

## 2024-02-24 NOTE — Patient Instructions (Signed)

## 2024-02-24 NOTE — Therapy (Signed)
 OUTPATIENT PHYSICAL THERAPY TREATMENT     Patient Name: Adrienne Harris MRN: [id number] DOB:Jan 04, 1976, 49 y.o., female Today's Date: 01/27/2024    PT End of Session - 02/24/24 0842     Visit Number 3    Date for Recertification  03/23/24    Authorization Type cone  [employer]  no auth required    PT Start Time 0757    PT Stop Time 0838    PT Time Calculation (min) 41 min    Activity Tolerance Patient tolerated treatment well    Behavior During Therapy [facility] for tasks assessed/performed                   Past Medical History:  Diagnosis Date   Atopic dermatitis     Basal cell carcinoma      15 years ago.   BRCA negative 2023    Invitae panel neg at [facility]   Breast cancer in female [facility])     Dysrhythmia     Hemochromatosis      Heterozygous for two genes   Palpitations      a. 04/2013 48h Holter: sinus rhythm/sinus arrhythmia, rare PAC's/PVC's.   PVC (premature ventricular contraction)     Shoulder pain, left 01/24/2015   Shoulder pain, right 01/24/2015             Past Surgical History:  Procedure Laterality Date   BREAST BIOPSY Right 09/25/2021    INVASIVE MAMMARY CARCINOMA,   BREAST LUMPECTOMY Right 10/28/2021    INVASIVE MAMMARY CARCINOMA,   BREAST LUMPECTOMY WITH RADIOACTIVE SEED AND SENTINEL LYMPH NODE BIOPSY Right 10/28/2021    Procedure: RIGHT BREAST LUMPECTOMY WITH RADIOACTIVE SEED AND AXILLARY SENTINEL LYMPH NODE BIOPSY;  Surgeon: Ebbie Cough, MD;  Location: [facility];  Service: General;  Laterality: Right;   COLONOSCOPY WITH PROPOFOL  N/A 01/24/2021    Procedure: COLONOSCOPY WITH PROPOFOL ;  Surgeon: Jinny Carmine, MD;  Location: [facility];  Service: Endoscopy;  Laterality: N/A;   Excision of basal cell carcinoma       PORT-A-CATH REMOVAL Left 03/13/2022    Procedure: REMOVAL PORT-A-CATH;  Surgeon: Ebbie Cough, MD;  Location: [facility];  Service: General;  Laterality: Left;   PORTACATH PLACEMENT Left  11/19/2021    Procedure: INSERTION PORT-A-CATH;  Surgeon: Ebbie Cough, MD;  Location:  SURGERY CENTER;  Service: General;  Laterality: Left;   WISDOM TOOTH EXTRACTION                Patient Active Problem List    Diagnosis Date Noted   BCC (basal cell carcinoma of skin) 10/09/2023   Right elbow pain 10/09/2023   Use of tamoxifen  (Nolvadex ) 07/24/2022   Lymphedema 07/17/2022   Genetic testing 10/24/2021   Malignant neoplasm of lower-inner quadrant of right breast of female, estrogen receptor positive (HCC) 10/01/2021   Colon cancer screening     Intractable menstrual migraine without status migrainosus 07/30/2020   Nonallopathic lesion of sacral region 12/30/2017   Trigger point of left shoulder region 10/06/2017   Slipped rib syndrome 08/05/2017   Nonallopathic lesion of rib cage 08/05/2017   Nonallopathic lesion of cervical region 08/05/2017   Nonallopathic lesion of thoracic region 08/05/2017   Nonallopathic lesion of lumbosacral region 08/05/2017   Chronic left shoulder pain 07/10/2017   Chronic LLQ pain 05/17/2017   Atopic dermatitis 07/10/2016   Hepatic cyst 07/10/2016   Routine physical examination 07/10/2016   PVC's (premature ventricular contractions) 04/22/2013   Hemochromatosis 04/22/2013  PCP: Dineen Rollene MATSU, FNP     REFERRING PROVIDER:Jackson, Morene DO     REFERRING DIAG: [phone] (ICD-10-CM) - Right elbow pain    THERAPY DIAG:  Pain in right elbow   Cramp and spasm   Muscle weakness (generalized)   Rationale for Evaluation and Treatment: Rehabilitation   ONSET DATE: over 6 months ago    SUBJECTIVE:                                                                                                                                                                                       SUBJECTIVE STATEMENT: I've been doing my exercises, and more consistent with ice.  I'm also doing cross friction mobilization.   Elbow pain  started over 6 months ago. Saw MD in September,  completed 3 wk course of meloxicam  doing HEP 3x/wk then pain increased after 3 weeks. Seems triggered by typing and how I hold my hands. Now using tennis elbow strap and wrist brace. Also doing nitro patch, but hasn't started. The Right arm is weak. Feels like the arm hangs lower and I have less tone in my R pectorals. May have small cord in the R chest. Hand dominance: Right   PERTINENT HISTORY: R breast cancer and lymphedema history.  Patient was diagnosed on 09/25/2021 with right grade 3 invasive mammary carcinoma of no particular type. She underwent a right lumpectomy and sentinel node biopsy on 09/28/2021. It is ER/PR positive and HER2 negative and the Ki67 was not measured.  Scheduled for [facility] in February.    PAIN: 02/24/24 Are you having pain? Yes: NPRS scale: 0 at rest to 3-4/10 with resistance/use  Pain location: R lateral elbow Pain description: sharp and dull Aggravating factors: picking up a cup or lifting a pot, carrying groceries, pro/sup Relieving factors: rest   PRECAUTIONS: Other: R Breast cancer, h/o lymphedema   RED FLAGS: None       WEIGHT BEARING RESTRICTIONS: No   FALLS:  Has patient fallen in last 6 months? 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   DIAGNOSTIC FINDINGS:  XR - negative   PATIENT SURVEYS :  UEFS  Extreme difficulty/unable (0), Quite a bit of difficulty (1), Moderate difficulty (2), Little difficulty (3), No difficulty (4) Survey date:  01/27/24  Any of your usual work, household or school  activities 3  2. Your usual hobbies, recreational/sport activities 0   3. Lifting a bag of  groceries to waist level 3   4. Lifting a bag of groceries above your head 3  5. Grooming your hair 4  6. Pushing up on your hands (I.e. from bathtub or chair) 3  7. Preparing food (I.e. peeling/cutting) 3  8. Driving  4  9. Vacuuming, sweeping, or raking 3  10. Dressing  4  11. Doing up buttons 4  12. Using tools/appliances 3  13. Opening doors 4  14. Cleaning  3  15. Tying or lacing shoes 4  16. Sleeping  4  17. Laundering clothes (I.e. washing, ironing, folding) 3  18. Opening a jar 3  19. Throwing a ball 3  20. Carrying a small suitcase with your affected limb.  3  Score total:  64/80      COGNITION: Overall cognitive status: Within functional limits for tasks assessed                                     SENSATION: WFL   POSTURE: Forward head, rounded shoulders   UPPER EXTREMITY ROM:    Active ROM Right eval Left eval  Elbow extension      Wrist flexion 42/80    Wrist extension 53/85    Wrist ulnar deviation      Wrist radial deviation      Wrist pronation      Wrist supination      (Blank rows = not tested)   UPPER EXTREMITY MMT: mild pain with resisted supination and    MMT Right eval Left eval  Shoulder flexion 5    Shoulder extension 4+    Shoulder abduction 4+    Shoulder adduction      Shoulder internal rotation 4+    Shoulder external rotation 5    Middle trapezius      Lower trapezius      Elbow flexion 5    Elbow extension 5    Wrist flexion 5    Wrist extension 4+    Wrist ulnar deviation 5    Wrist radial deviation 5    Wrist pronation 5    Wrist supination 5    Grip strength (lbs) 49 45  (Blank rows = not tested)         PALPATION:  R ECRB, and common extensor tendon and muscle bellies                                                                                                                                        TREATMENT DATE:  02/24/24: Wrist extension with supported arm 2# 2x10 Supination pronation 2# 2x10 Low row  with green band 2x10 Manual: instructed pt in gentle tissue mobilization and cross friction at proximal  wrist extensors  Trigger Point Dry Needling  Subsequent Treatment: Instructions provided previously at initial dry needling treatment.   Patient Verbal Consent Given: Yes Education Handout Provided: Yes Muscles Treated: Rt wrist extensors  Electrical Stimulation Performed: No Treatment Response/Outcome: Utilized skilled palpation to identify bony landmarks and trigger points.  Able to illicit twitch response and muscle elongation.  Soft tissue mobilization to muscles needled following DN to further promote tissue elongation and decreased pain.    Used suction cup to mobilize briefly and Cryostim after this until area was cold.   Ionto #2: 2cc dex to Rt lateral epicondyle x4 hour wear time- education provided    Stretching for wrist flex/ext done in between.    02/22/24:  3 way raises: 4# 2x10 Wrist extension with supported arm 2# 2x10 Supination pronation 2# 2x10 Low row with green band 2x10 Isometric wrist extension with 7# weight 2x 45 seconds with 2 min rest break Manual: instructed pt in gentle tissue mobilization and cross friction at proximal wrist extensors  Ionto #1: 2cc dex to Rt lateral epicondyle x4 hour wear time- education provided    Stretching for wrist flex/ext done in between.    01/27/24   See pt ed and HEP  Seated wrist ext isometric in 90 deg elbow flexion and palm down/wrist neutral 45 sec hold 5# x 3 reps with 2 min rest in between reps. (Pt to do 5 reps at home). Stretching for wrist flex/ext done in between.    PATIENT EDUCATION: Education details: PT eval findings, anticipated POC, initial HEP, role of TPDN, and fee schedule for TPDN and lack of insurance coverage requiring payment at time of service  Ionto instructions (02/22/24)   Person educated: Patient Education method: Explanation, Demonstration, Tactile cues, Verbal cues, and Handouts Education  comprehension: verbalized understanding and returned demonstration   HOME EXERCISE PROGRAM: Access Code: [email or web address] URL: [email or web address] Date: 02/22/2024 Prepared by: Burnard  Exercises - Standing Shoulder Flexion to 90 Degrees  - 1 x daily - 3 x weekly - 1 sets - 10 reps - 3 sec hold - Standing Shoulder Scaption  - 1 x daily - 3 x weekly - 1 sets - 10 reps - 3 sec hold - Shoulder Abduction - Thumbs Up  - 1 x daily - 3 x weekly - 1 sets - 10 reps - 3 sec hold - Wrist Extension with Dumbbell  - 1 x daily - 7 x weekly - 2 sets - 10 reps - Wrist Flexion with Dumbbell  - 1 x daily - 7 x weekly - 3 sets - 10 reps - Forearm Supination with Dumbbell  - 1 x daily - 7 x weekly - 2 sets - 10 reps - Standing Bilateral Low Shoulder Row with Anchored Resistance  - 1 x daily - 7 x weekly - 2 sets - 10 reps   ASSESSMENT:   CLINICAL IMPRESSION: Pt continues to be compliant with HEP.  Today focused on review of new HEP and dry needling with soft tissue mobilization using suction cup and cross friction. Good response to dry needling with improved tissue mobility and twitch response to wrist extensors.   Pain is affecting her ability to lift, carry and perform ADLS with wrist resistance. She will benefit from skilled PT to address these deficits and those listed below.      OBJECTIVE IMPAIRMENTS: decreased activity tolerance, decreased ROM, decreased strength, increased muscle spasms, impaired flexibility, impaired UE functional use, and pain.    ACTIVITY LIMITATIONS: carrying and  lifting   PARTICIPATION LIMITATIONS: meal prep, cleaning, laundry, driving, shopping, and occupation   PERSONAL FACTORS: Age, Time since onset of injury/illness/exacerbation, and 1 comorbidity: Breast cancer R are also affecting patient's functional outcome.    REHAB POTENTIAL: Excellent   CLINICAL DECISION MAKING: Stable/uncomplicated   EVALUATION COMPLEXITY: Moderate   GOALS: Goals reviewed with  patient? Yes   SHORT TERM GOALS: Target date: 02/24/24   Pain report decreased pain by 50% with ADLs  Baseline: no change (02/24/24) Goal status: in progress    2.  Patient will be independent with initial HEP  Baseline:  Goal status:MET       LONG TERM GOALS: Target date: 03/23/24   Patient to report pain no greater than 2/10 with ADLs including picking up a cup or lifting a pot, carrying groceries and supination of the forearm. Baseline:  Goal status: INITIAL   2.  Patient to be independent with advanced HEP  Baseline:  Goal status: INITIAL   3.   UEFS score to improve by 9 points showing functional improvement. Baseline: 64/80 Goal status: INITIAL     PLAN: PT FREQUENCY: 2x/week   PT DURATION: 8 weeks   PLANNED INTERVENTIONS: 97110-Therapeutic exercises, 97530- Therapeutic activity, [id number]- Neuromuscular re-education, 97535- Self Care, [id number]- Manual therapy, [id number]- Electrical stimulation (unattended), 97035- Ultrasound, 02966- Ionotophoresis 4mg /ml Dexamethasone , 79439 (1-2 muscles), 20561 (3+ muscles)- Dry Needling, Patient/Family education, Taping, Joint mobilization, Cryotherapy, and Moist heat   PLAN FOR NEXT SESSION: review HEP and add if needed, see how she responded to dry needling, ionto #3  Burnard Joy, PT 02/24/2024 9:06 AM

## 2024-02-28 NOTE — Therapy (Signed)
 OUTPATIENT PHYSICAL THERAPY TREATMENT     Patient Name: Adrienne Harris MRN: [id number] DOB:03-20-1975, 49 y.o., female Today's Date: 01/27/2024               Past Medical History:  Diagnosis Date   Atopic dermatitis     Basal cell carcinoma      15 years ago.   BRCA negative 2023    Invitae panel neg at [facility]   Breast cancer in female [facility])     Dysrhythmia     Hemochromatosis      Heterozygous for two genes   Palpitations      a. 04/2013 48h Holter: sinus rhythm/sinus arrhythmia, rare PAC's/PVC's.   PVC (premature ventricular contraction)     Shoulder pain, left 01/24/2015   Shoulder pain, right 01/24/2015             Past Surgical History:  Procedure Laterality Date   BREAST BIOPSY Right 09/25/2021    INVASIVE MAMMARY CARCINOMA,   BREAST LUMPECTOMY Right 10/28/2021    INVASIVE MAMMARY CARCINOMA,   BREAST LUMPECTOMY WITH RADIOACTIVE SEED AND SENTINEL LYMPH NODE BIOPSY Right 10/28/2021    Procedure: RIGHT BREAST LUMPECTOMY WITH RADIOACTIVE SEED AND AXILLARY SENTINEL LYMPH NODE BIOPSY;  Surgeon: Ebbie Cough, MD;  Location: [facility];  Service: General;  Laterality: Right;   COLONOSCOPY WITH PROPOFOL  N/A 01/24/2021    Procedure: COLONOSCOPY WITH PROPOFOL ;  Surgeon: Jinny Carmine, MD;  Location: [facility];  Service: Endoscopy;  Laterality: N/A;   Excision of basal cell carcinoma       PORT-A-CATH REMOVAL Left 03/13/2022    Procedure: REMOVAL PORT-A-CATH;  Surgeon: Ebbie Cough, MD;  Location: [facility];  Service: General;  Laterality: Left;   PORTACATH PLACEMENT Left 11/19/2021    Procedure: INSERTION PORT-A-CATH;  Surgeon: Ebbie Cough, MD;  Location: [facility];  Service: General;  Laterality: Left;   WISDOM TOOTH EXTRACTION                Patient Active Problem List    Diagnosis Date Noted   BCC (basal cell carcinoma of skin) 10/09/2023   Right elbow pain 10/09/2023   Use of tamoxifen   (Nolvadex ) 07/24/2022   Lymphedema 07/17/2022   Genetic testing 10/24/2021   Malignant neoplasm of lower-inner quadrant of right breast of female, estrogen receptor positive (HCC) 10/01/2021   Colon cancer screening     Intractable menstrual migraine without status migrainosus 07/30/2020   Nonallopathic lesion of sacral region 12/30/2017   Trigger point of left shoulder region 10/06/2017   Slipped rib syndrome 08/05/2017   Nonallopathic lesion of rib cage 08/05/2017   Nonallopathic lesion of cervical region 08/05/2017   Nonallopathic lesion of thoracic region 08/05/2017   Nonallopathic lesion of lumbosacral region 08/05/2017   Chronic left shoulder pain 07/10/2017   Chronic LLQ pain 05/17/2017   Atopic dermatitis 07/10/2016   Hepatic cyst 07/10/2016   Routine physical examination 07/10/2016   PVC's (premature ventricular contractions) 04/22/2013   Hemochromatosis 04/22/2013      PCP: Dineen Rollene MATSU, FNP     REFERRING PROVIDER:Jackson, Morene DO     REFERRING DIAG: [phone] (ICD-10-CM) - Right elbow pain    THERAPY DIAG:  Pain in right elbow   Cramp and spasm   Muscle weakness (generalized)   Rationale for Evaluation and Treatment: Rehabilitation   ONSET DATE: over 6 months ago    SUBJECTIVE:  SUBJECTIVE STATEMENT: ***   Elbow pain started over 6 months ago. Saw MD in September,  completed 3 wk course of meloxicam  doing HEP 3x/wk then pain increased after 3 weeks. Seems triggered by typing and how I hold my hands. Now using tennis elbow strap and wrist brace. Also doing nitro patch, but hasn't started. The Right arm is weak. Feels like the arm hangs lower and I have less tone in my R pectorals. May have small cord in the R chest. Hand dominance: Right   PERTINENT HISTORY: R breast  cancer and lymphedema history.  Patient was diagnosed on 09/25/2021 with right grade 3 invasive mammary carcinoma of no particular type. She underwent a right lumpectomy and sentinel node biopsy on 09/28/2021. It is ER/PR positive and HER2 negative and the Ki67 was not measured.  Scheduled for [facility] in February.    PAIN: 02/24/24 Are you having pain? Yes: NPRS scale: 0 at rest to 3-4/10 with resistance/use  Pain location: R lateral elbow Pain description: sharp and dull Aggravating factors: picking up a cup or lifting a pot, carrying groceries, pro/sup Relieving factors: rest   PRECAUTIONS: Other: R Breast cancer, h/o lymphedema   RED FLAGS: None       WEIGHT BEARING RESTRICTIONS: No   FALLS:  Has patient fallen in last 6 months? 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   DIAGNOSTIC FINDINGS:  XR - negative   PATIENT SURVEYS :  UEFS  Extreme difficulty/unable (0), Quite a bit of difficulty (1), Moderate difficulty (2), Little difficulty (3), No difficulty (4) Survey date:  01/27/24  Any of your usual work, household or school activities 3  2. Your usual hobbies, recreational/sport activities 0   3. Lifting a bag of groceries to waist level 3   4. Lifting a bag of groceries above your head 3  5. Grooming your hair 4  6. Pushing up on your hands (I.e. from bathtub or chair) 3  7. Preparing food (I.e. peeling/cutting) 3  8. Driving  4  9. Vacuuming, sweeping, or raking 3  10. Dressing  4  11. Doing up buttons 4  12. Using tools/appliances 3  13. Opening doors 4  14. Cleaning  3  15. Tying or lacing shoes 4  16. Sleeping  4  17. Laundering clothes (I.e. washing,  ironing, folding) 3  18. Opening a jar 3  19. Throwing a ball 3  20. Carrying a small suitcase with your affected limb.  3  Score total:  64/80      COGNITION: Overall cognitive status: Within functional limits for tasks assessed                                     SENSATION: WFL   POSTURE: Forward head, rounded shoulders   UPPER EXTREMITY ROM:    Active ROM Right eval Left eval  Elbow extension      Wrist flexion 42/80    Wrist extension 53/85    Wrist ulnar deviation      Wrist radial deviation      Wrist pronation  Wrist supination      (Blank rows = not tested)   UPPER EXTREMITY MMT: mild pain with resisted supination and    MMT Right eval Left eval  Shoulder flexion 5    Shoulder extension 4+    Shoulder abduction 4+    Shoulder adduction      Shoulder internal rotation 4+    Shoulder external rotation 5    Middle trapezius      Lower trapezius      Elbow flexion 5    Elbow extension 5    Wrist flexion 5    Wrist extension 4+    Wrist ulnar deviation 5    Wrist radial deviation 5    Wrist pronation 5    Wrist supination 5    Grip strength (lbs) 49 45  (Blank rows = not tested)         PALPATION:  R ECRB, and common extensor tendon and muscle bellies                                                                                                                                        TREATMENT DATE:  02/29/24: Wrist extension with supported arm 2# 2x10 Wrist ext plus elbow extension from 90 deg  Supination pronation 2# 2x10 Low row with green band 2x10 Carry  Manual:  Trigger Point Dry Needling  Subsequent Treatment: Instructions provided previously at initial dry needling treatment.   Patient Verbal Consent Given: Yes Education Handout Provided: Yes Muscles Treated: Rt wrist extensors  Electrical Stimulation Performed: No Treatment Response/Outcome: Utilized skilled palpation to identify bony landmarks and trigger points.  Able to  illicit twitch response and muscle elongation.  Soft tissue mobilization to muscles needled following DN to further promote tissue elongation and decreased pain.    Used suction cup to mobilize briefly and Cryostim after this until area was cold.   Ionto #3: 2cc dex to Rt lateral epicondyle x4 hour wear time- education provided      02/24/24: Wrist extension with supported arm 2# 2x10 Supination pronation 2# 2x10 Low row with green band 2x10 Manual: instructed pt in gentle tissue mobilization and cross friction at proximal wrist extensors  Trigger Point Dry Needling  Subsequent Treatment: Instructions provided previously at initial dry needling treatment.   Patient Verbal Consent Given: Yes Education Handout Provided: Yes Muscles Treated: Rt wrist extensors  Electrical Stimulation Performed: No Treatment Response/Outcome: Utilized skilled palpation to identify bony landmarks and trigger points.  Able to illicit twitch response and muscle elongation.  Soft tissue mobilization to muscles needled following DN to further promote tissue elongation and decreased pain.    Used suction cup to mobilize briefly and Cryostim after this until area was cold.   Ionto #2: 2cc dex to Rt lateral epicondyle x4 hour wear time- education provided    Stretching for wrist flex/ext done in between.  02/22/24:  3 way raises: 4# 2x10 Wrist extension with supported arm 2# 2x10 Supination pronation 2# 2x10 Low row with green band 2x10 Isometric wrist extension with 7# weight 2x 45 seconds with 2 min rest break Manual: instructed pt in gentle tissue mobilization and cross friction at proximal wrist extensors  Ionto #1: 2cc dex to Rt lateral epicondyle x4 hour wear time- education provided    Stretching for wrist flex/ext done in between.    01/27/24   See pt ed and HEP  Seated wrist ext isometric in 90 deg elbow flexion and palm down/wrist neutral 45 sec hold 5# x 3 reps with 2 min rest in between reps.  (Pt to do 5 reps at home). Stretching for wrist flex/ext done in between.    PATIENT EDUCATION: Education details: PT eval findings, anticipated POC, initial HEP, role of TPDN, and fee schedule for TPDN and lack of insurance coverage requiring payment at time of service  Ionto instructions (02/22/24)   Person educated: Patient Education method: Explanation, Demonstration, Tactile cues, Verbal cues, and Handouts Education comprehension: verbalized understanding and returned demonstration   HOME EXERCISE PROGRAM: Access Code: [email or web address] URL: [email or web address] Date: 02/22/2024 Prepared by: Burnard  Exercises - Standing Shoulder Flexion to 90 Degrees  - 1 x daily - 3 x weekly - 1 sets - 10 reps - 3 sec hold - Standing Shoulder Scaption  - 1 x daily - 3 x weekly - 1 sets - 10 reps - 3 sec hold - Shoulder Abduction - Thumbs Up  - 1 x daily - 3 x weekly - 1 sets - 10 reps - 3 sec hold - Wrist Extension with Dumbbell  - 1 x daily - 7 x weekly - 2 sets - 10 reps - Wrist Flexion with Dumbbell  - 1 x daily - 7 x weekly - 3 sets - 10 reps - Forearm Supination with Dumbbell  - 1 x daily - 7 x weekly - 2 sets - 10 reps - Standing Bilateral Low Shoulder Row with Anchored Resistance  - 1 x daily - 7 x weekly - 2 sets - 10 reps   ASSESSMENT:   CLINICAL IMPRESSION: ***  Pain is affecting her ability to lift, carry and perform ADLS with wrist resistance. She will benefit from skilled PT to address these deficits and those listed below.      OBJECTIVE IMPAIRMENTS: decreased activity tolerance, decreased ROM, decreased strength, increased muscle spasms, impaired flexibility, impaired UE functional use, and pain.    ACTIVITY LIMITATIONS: carrying and lifting   PARTICIPATION LIMITATIONS: meal prep, cleaning, laundry, driving, shopping, and occupation   PERSONAL FACTORS: Age, Time since onset of injury/illness/exacerbation, and 1 comorbidity: Breast cancer R are also affecting patient's  functional outcome.    REHAB POTENTIAL: Excellent   CLINICAL DECISION MAKING: Stable/uncomplicated   EVALUATION COMPLEXITY: Moderate   GOALS: Goals reviewed with patient? Yes   SHORT TERM GOALS: Target date: 02/24/24   Pain report decreased pain by 50% with ADLs  Baseline: no change (02/24/24) Goal status: in progress    2.  Patient will be independent with initial HEP  Baseline:  Goal status:MET       LONG TERM GOALS: Target date: 03/23/24   Patient to report pain no greater than 2/10 with ADLs including picking up a cup or lifting a pot, carrying groceries and supination of the forearm. Baseline:  Goal status: INITIAL   2.  Patient to be independent with advanced HEP  Baseline:  Goal status:  INITIAL   3.   UEFS score to improve by 9 points showing functional improvement. Baseline: 64/80 Goal status: INITIAL     PLAN: PT FREQUENCY: 2x/week   PT DURATION: 8 weeks   PLANNED INTERVENTIONS: 97110-Therapeutic exercises, 97530- Therapeutic activity, 97112- Neuromuscular re-education, 97535- Self Care, 02859- Manual therapy, G0283- Electrical stimulation (unattended), 97035- Ultrasound, 02966- Ionotophoresis 4mg /ml Dexamethasone , 79439 (1-2 muscles), 20561 (3+ muscles)- Dry Needling, Patient/Family education, Taping, Joint mobilization, Cryotherapy, and Moist heat   PLAN FOR NEXT SESSION: review HEP and add if needed, see how she responded to dry needling, ionto #3  Mliss Cummins, PT  02/28/24 4:47 PM

## 2024-02-29 ENCOUNTER — Encounter: Payer: Self-pay | Admitting: Physical Therapy

## 2024-02-29 ENCOUNTER — Ambulatory Visit: Admitting: Physical Therapy

## 2024-02-29 DIAGNOSIS — M25521 Pain in right elbow: Secondary | ICD-10-CM | POA: Diagnosis not present

## 2024-02-29 DIAGNOSIS — R252 Cramp and spasm: Secondary | ICD-10-CM

## 2024-02-29 DIAGNOSIS — M6281 Muscle weakness (generalized): Secondary | ICD-10-CM

## 2024-03-01 ENCOUNTER — Other Ambulatory Visit: Payer: Self-pay

## 2024-03-01 ENCOUNTER — Other Ambulatory Visit: Payer: Self-pay | Admitting: Family

## 2024-03-01 DIAGNOSIS — M25521 Pain in right elbow: Secondary | ICD-10-CM

## 2024-03-01 MED ORDER — CYCLOBENZAPRINE HCL 5 MG PO TABS
5.0000 mg | ORAL_TABLET | Freq: Every evening | ORAL | 1 refills | Status: AC | PRN
  Filled 2024-03-01: qty 30, 15d supply, fill #0

## 2024-03-02 ENCOUNTER — Ambulatory Visit

## 2024-03-02 DIAGNOSIS — M6281 Muscle weakness (generalized): Secondary | ICD-10-CM

## 2024-03-02 DIAGNOSIS — M25521 Pain in right elbow: Secondary | ICD-10-CM | POA: Diagnosis not present

## 2024-03-02 DIAGNOSIS — R252 Cramp and spasm: Secondary | ICD-10-CM

## 2024-03-02 NOTE — Therapy (Signed)
 OUTPATIENT PHYSICAL THERAPY TREATMENT     Patient Name: Adrienne Harris MRN: [id number] DOB:May 12, 1975, 49 y.o., female Today's Date: 01/27/2024    PT End of Session - 03/02/24 0845     Visit Number 5    Date for Recertification  03/23/24    Authorization Type cone  [place]  no auth required    PT Start Time 0800    PT Stop Time 0839    PT Time Calculation (min) 39 min    Activity Tolerance Patient tolerated treatment well    Behavior During Therapy [facility] for tasks assessed/performed                     Past Medical History:  Diagnosis Date   Atopic dermatitis     Basal cell carcinoma      15 years ago.   BRCA negative 2023    Invitae panel neg at [facility]   Breast cancer in female [facility])     Dysrhythmia     Hemochromatosis      Heterozygous for two genes   Palpitations      a. 04/2013 48h Holter: sinus rhythm/sinus arrhythmia, rare PAC's/PVC's.   PVC (premature ventricular contraction)     Shoulder pain, left 01/24/2015   Shoulder pain, right 01/24/2015             Past Surgical History:  Procedure Laterality Date   BREAST BIOPSY Right 09/25/2021    INVASIVE MAMMARY CARCINOMA,   BREAST LUMPECTOMY Right 10/28/2021    INVASIVE MAMMARY CARCINOMA,   BREAST LUMPECTOMY WITH RADIOACTIVE SEED AND SENTINEL LYMPH NODE BIOPSY Right 10/28/2021    Procedure: RIGHT BREAST LUMPECTOMY WITH RADIOACTIVE SEED AND AXILLARY SENTINEL LYMPH NODE BIOPSY;  Surgeon: Ebbie Cough, MD;  Location: [facility];  Service: General;  Laterality: Right;   COLONOSCOPY WITH PROPOFOL  N/A 01/24/2021    Procedure: COLONOSCOPY WITH PROPOFOL ;  Surgeon: Jinny Carmine, MD;  Location: [facility];  Service: Endoscopy;  Laterality: N/A;   Excision of basal cell carcinoma       PORT-A-CATH REMOVAL Left 03/13/2022    Procedure: REMOVAL PORT-A-CATH;  Surgeon: Ebbie Cough, MD;  Location: [facility];  Service: General;  Laterality: Left;   PORTACATH PLACEMENT Left  11/19/2021    Procedure: INSERTION PORT-A-CATH;  Surgeon: Ebbie Cough, MD;  Location: [facility];  Service: General;  Laterality: Left;   WISDOM TOOTH EXTRACTION                Patient Active Problem List    Diagnosis Date Noted   BCC (basal cell carcinoma of skin) 10/09/2023   Right elbow pain 10/09/2023   Use of tamoxifen  (Nolvadex ) 07/24/2022   Lymphedema 07/17/2022   Genetic testing 10/24/2021   Malignant neoplasm of lower-inner quadrant of right breast of female, estrogen receptor positive (HCC) 10/01/2021   Colon cancer screening     Intractable menstrual migraine without status migrainosus 07/30/2020   Nonallopathic lesion of sacral region 12/30/2017   Trigger point of left shoulder region 10/06/2017   Slipped rib syndrome 08/05/2017   Nonallopathic lesion of rib cage 08/05/2017   Nonallopathic lesion of cervical region 08/05/2017   Nonallopathic lesion of thoracic region 08/05/2017   Nonallopathic lesion of lumbosacral region 08/05/2017   Chronic left shoulder pain 07/10/2017   Chronic LLQ pain 05/17/2017   Atopic dermatitis 07/10/2016   Hepatic cyst 07/10/2016   Routine physical examination 07/10/2016   PVC's (premature ventricular contractions) 04/22/2013  Hemochromatosis 04/22/2013      PCP: Dineen Rollene MATSU, FNP     REFERRING PROVIDER:Jackson, Morene DO     REFERRING DIAG: [phone] (ICD-10-CM) - Right elbow pain    THERAPY DIAG:  Pain in right elbow   Cramp and spasm   Muscle weakness (generalized)   Rationale for Evaluation and Treatment: Rehabilitation   ONSET DATE: over 6 months ago    SUBJECTIVE:                                                                                                                                                                                       SUBJECTIVE STATEMENT: I rested yesterday and it felt better this morning.  No significant change in symptoms thus far.    Elbow pain started over  6 months ago. Saw MD in September,  completed 3 wk course of meloxicam  doing HEP 3x/wk then pain increased after 3 weeks. Seems triggered by typing and how I hold my hands. Now using tennis elbow strap and wrist brace. Also doing nitro patch, but hasn't started. The Right arm is weak. Feels like the arm hangs lower and I have less tone in my R pectorals. May have small cord in the R chest. Hand dominance: Right   PERTINENT HISTORY: R breast cancer and lymphedema history.  Patient was diagnosed on 09/25/2021 with right grade 3 invasive mammary carcinoma of no particular type. She underwent a right lumpectomy and sentinel node biopsy on 09/28/2021. It is ER/PR positive and HER2 negative and the Ki67 was not measured.  Scheduled for [facility] in February.    PAIN: 03/02/24 Are you having pain? Yes: NPRS scale: 0 at rest to 3-4/10 with resistance/use  Pain location: R lateral elbow Pain description: sharp and dull Aggravating factors: picking up a cup or lifting a pot, carrying groceries, pro/sup Relieving factors: rest   PRECAUTIONS: Other: R Breast cancer, h/o lymphedema   RED FLAGS: None       WEIGHT BEARING RESTRICTIONS: No   FALLS:  Has patient fallen in last 6 months? 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   DIAGNOSTIC FINDINGS:  XR - negative   PATIENT SURVEYS :  UEFS  Extreme difficulty/unable (0), Quite a bit of difficulty (1), Moderate difficulty (2), Little difficulty (3), No difficulty (4) Survey date:  01/27/24  Any of your usual work, household or school activities 3  2. Your usual hobbies, recreational/sport activities 0   3. Lifting a bag of groceries to waist  level 3   4. Lifting a bag of groceries above your head 3  5. Grooming your hair 4  6. Pushing up on your hands (I.e. from bathtub or chair) 3  7. Preparing food (I.e. peeling/cutting) 3  8. Driving  4  9. Vacuuming, sweeping, or raking 3  10. Dressing  4  11. Doing up buttons 4  12. Using tools/appliances 3  13. Opening doors 4  14. Cleaning  3  15. Tying or lacing shoes 4  16. Sleeping  4  17. Laundering clothes (I.e. washing, ironing, folding) 3  18. Opening a jar 3  19. Throwing a ball 3  20. Carrying a small suitcase with your affected limb.  3  Score total:  64/80      COGNITION: Overall cognitive status: Within functional limits for tasks assessed                                     SENSATION: WFL   POSTURE: Forward head, rounded shoulders   UPPER EXTREMITY ROM:    Active ROM Right eval Left eval  Elbow extension      Wrist flexion 42/80    Wrist extension 53/85    Wrist ulnar deviation      Wrist radial deviation      Wrist pronation      Wrist supination      (Blank rows = not tested)   UPPER EXTREMITY MMT: mild pain with resisted supination and    MMT Right eval Left eval  Shoulder flexion 5    Shoulder extension 4+    Shoulder abduction 4+    Shoulder adduction      Shoulder internal rotation 4+    Shoulder external rotation 5    Middle trapezius      Lower trapezius      Elbow flexion 5    Elbow extension 5    Wrist flexion 5    Wrist extension 4+    Wrist ulnar deviation 5    Wrist radial deviation 5    Wrist pronation 5    Wrist supination 5    Grip strength (lbs) 49 45  (Blank rows = not tested)         PALPATION:  R ECRB, and common extensor tendon and muscle bellies                                                                                                                                        TREATMENT DATE:  03/02/24: Wrist extension with supported arm 4# 2x10 Supination pronation 4# 2x10 - some pain in wrist, no  elbow pain Wrist neutral  palm down plus elbow extension from 90 deg 2# 2x10 slow Standing wrist neutral palm down shoulder flexion 2# 2x 10 slow Standing wrist neutral palm down shoulder flexion 2# 2x 10 slow Bent over row 10# KB supinated x 10, pronated x 10 Carry 15# x 2 laps around both gyms  Manual: STM/cross friction to ECRB, cryostim until numb at lateral epicondyle  Ionto #4: 2cc dex to Rt lateral epicondyle x4 hour wear time- education provided    02/29/24: Wrist extension with supported arm 4# 2x10 Supination pronation 4# 2x10 - some pain in wrist, no elbow pain Wrist neutral palm down plus elbow extension from 90 deg 2# 2x10 slow Standing wrist neutral palm down shoulder flexion 2# 2x 10 slow Standing wrist neutral palm down shoulder flexion 2# 2x 10 slow Bent over row 10# KB supinated x 10, pronated x 10 Carry 15# x 2 laps around both gyms  Manual: STM/cross friction to ECRB  Ionto #3: 2cc dex to Rt lateral epicondyle x4 hour wear time- education provided    02/24/24: Wrist extension with supported arm 2# 2x10 Supination pronation 2# 2x10 Low row with green band 2x10 Manual: instructed pt in gentle tissue mobilization and cross friction at proximal wrist extensors  Trigger Point Dry Needling  Subsequent Treatment: Instructions provided previously at initial dry needling treatment.   Patient Verbal Consent Given: Yes Education Handout Provided: Yes Muscles Treated: Rt wrist extensors  Electrical Stimulation Performed: No Treatment Response/Outcome: Utilized skilled palpation to identify bony landmarks and trigger points.  Able to illicit twitch response and muscle elongation.  Soft tissue mobilization to muscles needled following DN to further promote tissue elongation and decreased pain.    Used suction cup to mobilize briefly and Cryostim after this until area was cold.   Ionto #2: 2cc dex to Rt lateral epicondyle x4 hour wear time- education provided     Stretching for wrist flex/ext done in between.      PATIENT EDUCATION: Education details: PT eval findings, anticipated POC, initial HEP, role of TPDN, and fee schedule for TPDN and lack of insurance coverage requiring payment at time of service  Ionto instructions (02/22/24)   Person educated: Patient Education method: Explanation, Demonstration, Tactile cues, Verbal cues, and Handouts Education comprehension: verbalized understanding and returned demonstration   HOME EXERCISE PROGRAM: Access Code: [email or web address] URL: [email or web address] Date: 02/29/2024 Prepared by: Mliss  Exercises - Standing Shoulder Flexion to 90 Degrees  - 1 x daily - 3 x weekly - 1 sets - 10 reps - 3 sec hold - Standing Shoulder Scaption  - 1 x daily - 3 x weekly - 1 sets - 10 reps - 3 sec hold - Shoulder Abduction - Thumbs Up  - 1 x daily - 3 x weekly - 1 sets - 10 reps - 3 sec hold - Wrist Extension with Dumbbell  - 1 x daily - 7 x weekly - 2 sets - 10 reps - Wrist Flexion with Dumbbell  - 1 x daily - 7 x weekly - 3 sets - 10 reps - Forearm Supination with Dumbbell  - 1 x daily - 7 x weekly - 2 sets - 10 reps - Standing Bilateral Low Shoulder Row with Anchored Resistance  - 1 x daily - 7 x weekly - 2 sets - 10 reps - Seated Single Arm Shoulder Flexion with Dumbbells  - 1 x daily - 7 x weekly - 2 sets - 10 reps - Kettlebell Suitcase Carry  - 1 x daily -  7 x weekly - 1 sets - 1 reps[   ASSESSMENT:   CLINICAL IMPRESSION: Patient with ongoing pain and denies significant change in her overall frequency and intensity of pain.  She tolerated new exercises well this week and the forearm did fatigue, but no pain reported. Pt is now doing exercises every other day and she did have some pain reduction this morning. ECRB is still tender and tense with palpation. Ionto patch #4 applied. Pain is affecting her ability to lift, carry and perform ADLS with wrist resistance. She will benefit from skilled PT to address  these deficits and those listed below.      OBJECTIVE IMPAIRMENTS: decreased activity tolerance, decreased ROM, decreased strength, increased muscle spasms, impaired flexibility, impaired UE functional use, and pain.    ACTIVITY LIMITATIONS: carrying and lifting   PARTICIPATION LIMITATIONS: meal prep, cleaning, laundry, driving, shopping, and occupation   PERSONAL FACTORS: Age, Time since onset of injury/illness/exacerbation, and 1 comorbidity: Breast cancer R are also affecting patient's functional outcome.    REHAB POTENTIAL: Excellent   CLINICAL DECISION MAKING: Stable/uncomplicated   EVALUATION COMPLEXITY: Moderate   GOALS: Goals reviewed with patient? Yes   SHORT TERM GOALS: Target date: 02/24/24   Pain report decreased pain by 50% with ADLs  Baseline: no change (02/24/24) Goal status: in progress    2.  Patient will be independent with initial HEP  Baseline:  Goal status:MET       LONG TERM GOALS: Target date: 03/23/24   Patient to report pain no greater than 2/10 with ADLs including picking up a cup or lifting a pot, carrying groceries and supination of the forearm. Baseline:  Goal status: INITIAL   2.  Patient to be independent with advanced HEP  Baseline:  Goal status: INITIAL   3.   UEFS score to improve by 9 points showing functional improvement. Baseline: 64/80 Goal status: INITIAL     PLAN: PT FREQUENCY: 2x/week   PT DURATION: 8 weeks   PLANNED INTERVENTIONS: 97110-Therapeutic exercises, 97530- Therapeutic activity, 97112- Neuromuscular re-education, 97535- Self Care, 02859- Manual therapy, [id number]- Electrical stimulation (unattended), 97035- Ultrasound, 02966- Ionotophoresis 4mg /ml Dexamethasone , 79439 (1-2 muscles), 20561 (3+ muscles)- Dry Needling, Patient/Family education, Taping, Joint mobilization, Cryotherapy, and Moist heat   PLAN FOR NEXT SESSION: review HEP and add if needed, see how she responded to day of rest, ionto #4, consider return to MD  and place on hold due to limited change in symptoms  Burnard Joy, PT 03/02/24 8:46 AM

## 2024-03-07 ENCOUNTER — Ambulatory Visit

## 2024-03-07 ENCOUNTER — Ambulatory Visit: Admitting: Sports Medicine

## 2024-03-09 ENCOUNTER — Ambulatory Visit

## 2024-03-14 ENCOUNTER — Ambulatory Visit: Attending: General Surgery | Admitting: Physical Therapy

## 2024-03-16 ENCOUNTER — Ambulatory Visit: Attending: General Surgery

## 2024-03-16 DIAGNOSIS — M25521 Pain in right elbow: Secondary | ICD-10-CM

## 2024-03-16 DIAGNOSIS — R252 Cramp and spasm: Secondary | ICD-10-CM

## 2024-03-16 DIAGNOSIS — M6281 Muscle weakness (generalized): Secondary | ICD-10-CM

## 2024-03-16 NOTE — Therapy (Signed)
 OUTPATIENT PHYSICAL THERAPY TREATMENT     Patient Name: Adrienne Harris MRN: [id number] DOB:12-05-1975, 49 y.o., female Today's Date: 01/27/2024    PT End of Session - 03/16/24 0839     Visit Number 6    Date for Recertification  03/23/24    Authorization Type cone  [employer]  no auth required    PT Start Time 0758    PT Stop Time 0839    PT Time Calculation (min) 41 min    Activity Tolerance Patient tolerated treatment well    Behavior During Therapy [facility] for tasks assessed/performed                      Past Medical History:  Diagnosis Date   Atopic dermatitis     Basal cell carcinoma      15 years ago.   BRCA negative 2023    Invitae panel neg at [facility]   Breast cancer in female [facility])     Dysrhythmia     Hemochromatosis      Heterozygous for two genes   Palpitations      a. 04/2013 48h Holter: sinus rhythm/sinus arrhythmia, rare PAC's/PVC's.   PVC (premature ventricular contraction)     Shoulder pain, left 01/24/2015   Shoulder pain, right 01/24/2015             Past Surgical History:  Procedure Laterality Date   BREAST BIOPSY Right 09/25/2021    INVASIVE MAMMARY CARCINOMA,   BREAST LUMPECTOMY Right 10/28/2021    INVASIVE MAMMARY CARCINOMA,   BREAST LUMPECTOMY WITH RADIOACTIVE SEED AND SENTINEL LYMPH NODE BIOPSY Right 10/28/2021    Procedure: RIGHT BREAST LUMPECTOMY WITH RADIOACTIVE SEED AND AXILLARY SENTINEL LYMPH NODE BIOPSY;  Surgeon: Ebbie Cough, MD;  Location: [facility];  Service: General;  Laterality: Right;   COLONOSCOPY WITH PROPOFOL  N/A 01/24/2021    Procedure: COLONOSCOPY WITH PROPOFOL ;  Surgeon: Jinny Carmine, MD;  Location: [facility];  Service: Endoscopy;  Laterality: N/A;   Excision of basal cell carcinoma       PORT-A-CATH REMOVAL Left 03/13/2022    Procedure: REMOVAL PORT-A-CATH;  Surgeon: Ebbie Cough, MD;  Location: [facility];  Service: General;  Laterality: Left;   PORTACATH PLACEMENT  Left 11/19/2021    Procedure: INSERTION PORT-A-CATH;  Surgeon: Ebbie Cough, MD;  Location: [facility];  Service: General;  Laterality: Left;   WISDOM TOOTH EXTRACTION                Patient Active Problem List    Diagnosis Date Noted   BCC (basal cell carcinoma of skin) 10/09/2023   Right elbow pain 10/09/2023   Use of tamoxifen  (Nolvadex ) 07/24/2022   Lymphedema 07/17/2022   Genetic testing 10/24/2021   Malignant neoplasm of lower-inner quadrant of right breast of female, estrogen receptor positive (HCC) 10/01/2021   Colon cancer screening     Intractable menstrual migraine without status migrainosus 07/30/2020   Nonallopathic lesion of sacral region 12/30/2017   Trigger point of left shoulder region 10/06/2017   Slipped rib syndrome 08/05/2017   Nonallopathic lesion of rib cage 08/05/2017   Nonallopathic lesion of cervical region 08/05/2017   Nonallopathic lesion of thoracic region 08/05/2017   Nonallopathic lesion of lumbosacral region 08/05/2017   Chronic left shoulder pain 07/10/2017   Chronic LLQ pain 05/17/2017   Atopic dermatitis 07/10/2016   Hepatic cyst 07/10/2016   Routine physical examination 07/10/2016   PVC's (premature ventricular contractions) 04/22/2013  Hemochromatosis 04/22/2013      PCP: Dineen Rollene MATSU, FNP     REFERRING PROVIDER:Jackson, Morene DO     REFERRING DIAG: [phone] (ICD-10-CM) - Right elbow pain    THERAPY DIAG:  Pain in right elbow   Cramp and spasm   Muscle weakness (generalized)   Rationale for Evaluation and Treatment: Rehabilitation   ONSET DATE: over 6 months ago    SUBJECTIVE:                                                                                                                                                                                       SUBJECTIVE STATEMENT: I had to miss due to weather.  I feel slightly better but not significant. I'm doing exercises every other day and I have  some soreness after the exercises.     Elbow pain started over 6 months ago. Saw MD in September,  completed 3 wk course of meloxicam  doing HEP 3x/wk then pain increased after 3 weeks. Seems triggered by typing and how I hold my hands. Now using tennis elbow strap and wrist brace. Also doing nitro patch, but hasn't started. The Right arm is weak. Feels like the arm hangs lower and I have less tone in my R pectorals. May have small cord in the R chest. Hand dominance: Right   PERTINENT HISTORY: R breast cancer and lymphedema history.  Patient was diagnosed on 09/25/2021 with right grade 3 invasive mammary carcinoma of no particular type. She underwent a right lumpectomy and sentinel node biopsy on 09/28/2021. It is ER/PR positive and HER2 negative and the Ki67 was not measured.  Scheduled for [facility] in February.    PAIN: 03/16/24 Are you having pain? Yes: NPRS scale: 0 at rest to 2-3/10 with resistance/use  Pain location: Rt  lateral elbow Pain description: sharp and dull Aggravating factors: lifting heavy items like groceries Relieving factors: rest   PRECAUTIONS: Other: R Breast cancer, h/o lymphedema   RED FLAGS: None       WEIGHT BEARING RESTRICTIONS: No   FALLS:  Has patient fallen in last 6 months? 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   DIAGNOSTIC FINDINGS:  XR - negative   PATIENT SURVEYS :  UEFS  Extreme difficulty/unable (0), Quite a bit of difficulty (1), Moderate difficulty (2), Little difficulty (3),  No difficulty (4) Survey date:  01/27/24  Any of your usual work, household or school activities 3  2. Your usual hobbies, recreational/sport activities 0   3.  Lifting a bag of groceries to waist level 3   4. Lifting a bag of groceries above your head 3  5. Grooming your hair 4  6. Pushing up on your hands (I.e. from bathtub or chair) 3  7. Preparing food (I.e. peeling/cutting) 3  8. Driving  4  9. Vacuuming, sweeping, or raking 3  10. Dressing  4  11. Doing up buttons 4  12. Using tools/appliances 3  13. Opening doors 4  14. Cleaning  3  15. Tying or lacing shoes 4  16. Sleeping  4  17. Laundering clothes (I.e. washing, ironing, folding) 3  18. Opening a jar 3  19. Throwing a ball 3  20. Carrying a small suitcase with your affected limb.  3  Score total:  64/80   03/16/24: UEFS: 74/80   COGNITION: Overall cognitive status: Within functional limits for tasks assessed                                     SENSATION: WFL   POSTURE: Forward head, rounded shoulders   UPPER EXTREMITY ROM:    Active ROM Right eval Left eval  Elbow extension      Wrist flexion 42/80    Wrist extension 53/85    Wrist ulnar deviation      Wrist radial deviation      Wrist pronation      Wrist supination      (Blank rows = not tested)   UPPER EXTREMITY MMT: mild pain with resisted supination and    MMT Right eval Left eval  Shoulder flexion 5    Shoulder extension 4+    Shoulder abduction 4+    Shoulder adduction      Shoulder internal rotation 4+    Shoulder external rotation 5    Middle trapezius      Lower trapezius      Elbow flexion 5    Elbow extension 5    Wrist flexion 5    Wrist extension 4+    Wrist ulnar deviation 5    Wrist radial deviation 5    Wrist pronation 5    Wrist supination 5    Grip strength (lbs) 49 45  (Blank rows = not tested)         PALPATION:  R ECRB, and common extensor tendon and muscle bellies                                                                                                                                        TREATMENT DATE:  03/16/24: Wrist extension with supported arm 4#  2x10 Supination pronation 4# 2x10 -  some pain in wrist, no elbow pain Wrist neutral palm down plus elbow extension from 90 deg 2# 2x10 slow Standing wrist neutral palm down shoulder flexion 2# 2x 10 slow Standing wrist neutral palm down shoulder flexion 2# 2x 10 slow Bent over: reverse fly with 5# and shoulder extension 5# 2x10 each  Bent over row 10# KB supinated x 10, pronated x 10 Carry 20# x 1 laps around both gyms  Manual: STM/cross friction to ECRB  Ionto #5: 2cc dex to Rt lateral epicondyle x4 hour wear time- education provided    03/02/24: Wrist extension with supported arm 4# 2x10 Supination pronation 4# 2x10 - some pain in wrist, no elbow pain Wrist neutral palm down plus elbow extension from 90 deg 2# 2x10 slow Standing wrist neutral palm down shoulder flexion 2# 2x 10 slow Standing wrist neutral palm down shoulder flexion 2# 2x 10 slow Bent over row 10# KB supinated x 10, pronated x 10 Carry 15# x 2 laps around both gyms  Manual: STM/cross friction to ECRB, cryostim until numb at lateral epicondyle  Ionto #4: 2cc dex to Rt lateral epicondyle x4 hour wear time- education provided    02/29/24: Wrist extension with supported arm 4# 2x10 Supination pronation 4# 2x10 - some pain in wrist, no elbow pain Wrist neutral palm down plus elbow extension from 90 deg 2# 2x10 slow Standing wrist neutral palm down shoulder flexion 2# 2x 10 slow Standing wrist neutral palm down shoulder flexion 2# 2x 10 slow Bent over row 10# KB supinated x 10, pronated x 10 Carry 15# x 2 laps around both gyms  Manual: STM/cross friction to ECRB  Ionto #3: 2cc dex to Rt lateral epicondyle x4 hour wear time- education provided      PATIENT EDUCATION: Education details: PT eval findings, anticipated POC, initial HEP, role of TPDN, and fee schedule for TPDN and lack of insurance coverage requiring payment at time of service  Ionto instructions (02/22/24)   Person educated: Patient Education  method: Explanation, Demonstration, Tactile cues, Verbal cues, and Handouts Education comprehension: verbalized understanding and returned demonstration   HOME EXERCISE PROGRAM: Access Code: [email or web address] URL: [email or web address] Date: 03/16/2024 Prepared by: Burnard  Exercises - Standing Shoulder Flexion to 90 Degrees  - 1 x daily - 3 x weekly - 1 sets - 10 reps - 3 sec hold - Standing Shoulder Scaption  - 1 x daily - 3 x weekly - 1 sets - 10 reps - 3 sec hold - Shoulder Abduction - Thumbs Up  - 1 x daily - 3 x weekly - 1 sets - 10 reps - 3 sec hold - Wrist Extension with Dumbbell  - 1 x daily - 7 x weekly - 2 sets - 10 reps - Wrist Flexion with Dumbbell  - 1 x daily - 7 x weekly - 3 sets - 10 reps - Forearm Supination with Dumbbell  - 1 x daily - 7 x weekly - 2 sets - 10 reps - Standing Bilateral Low Shoulder Row with Anchored Resistance  - 1 x daily - 7 x weekly - 2 sets - 10 reps - Seated Single Arm Shoulder Flexion with Dumbbells  - 1 x daily - 7 x weekly - 2 sets - 10 reps - Kettlebell Suitcase Carry  - 1 x daily - 7 x weekly - 1 sets - 1 reps - Single Arm Bent Over Fly  - 1 x daily - 7 x weekly - 2 sets - 10 reps - Single Arm  Bent Over Shoulder Extension with Dumbbell  - 1 x daily - 7 x weekly - 2 sets - 10 reps   ASSESSMENT:   CLINICAL IMPRESSION: Lapse in treatment due to weather and clinic closure.  Pt is now doing exercises every other day and this continues to work for her.  PT added to HEP for additional posterior shoulder/scapular strength.  She no longer has pain with lifting lighter objects like a mug or pan.  She has pain with lifting heavier objects like groceries and her average pain is 2-3/10, down from 3-4/10.  UEFS is improved by 10 points to 74/80, indicating improved function with Rt UE. ECRB is less tender and tense with palpation. Ionto patch #5 applied. Pain is affecting her ability to lift, carry and perform ADLS with wrist resistance. She will benefit  from skilled PT to address these deficits and those listed below.      OBJECTIVE IMPAIRMENTS: decreased activity tolerance, decreased ROM, decreased strength, increased muscle spasms, impaired flexibility, impaired UE functional use, and pain.    ACTIVITY LIMITATIONS: carrying and lifting   PARTICIPATION LIMITATIONS: meal prep, cleaning, laundry, driving, shopping, and occupation   PERSONAL FACTORS: Age, Time since onset of injury/illness/exacerbation, and 1 comorbidity: Breast cancer R are also affecting patient's functional outcome.    REHAB POTENTIAL: Excellent   CLINICAL DECISION MAKING: Stable/uncomplicated   EVALUATION COMPLEXITY: Moderate   GOALS: Goals reviewed with patient? Yes   SHORT TERM GOALS: Target date: 02/24/24   Pain report decreased pain by 50% with ADLs  Baseline: 10-20% (03/16/24) Goal status: in progress    2.  Patient will be independent with initial HEP  Baseline:  Goal status:MET       LONG TERM GOALS: Target date: 03/23/24   Patient to report pain no greater than 2/10 with ADLs including picking up a cup or lifting a pot, carrying groceries and supination of the forearm. Baseline:  2-3/10 (03/16/24) Goal status: in progress    2.  Patient to be independent with advanced HEP  Baseline:  Goal status: INITIAL   3.   UEFS score to improve by 9 points showing functional improvement. Baseline: 74/80 (03/16/24) Goal status: MET     PLAN: PT FREQUENCY: 2x/week   PT DURATION: 8 weeks   PLANNED INTERVENTIONS: 97110-Therapeutic exercises, 97530- Therapeutic activity, 97112- Neuromuscular re-education, 97535- Self Care, 02859- Manual therapy, G0283- Electrical stimulation (unattended), 97035- Ultrasound, 02966- Ionotophoresis 4mg /ml Dexamethasone , 79439 (1-2 muscles), 20561 (3+ muscles)- Dry Needling, Patient/Family education, Taping, Joint mobilization, Cryotherapy, and Moist heat   PLAN FOR NEXT SESSION: review HEP and add if needed, see how she responded  to day of rest, ionto #6, Pt sees MD on 03/21/24 after PT  Burnard Joy, PT 03/16/24 8:42 AM

## 2024-03-18 NOTE — Progress Notes (Unsigned)
"             ° °   [facility] [address] [place] [address] Phone: [phone]   Assessment and Plan:     ***    Pertinent previous records reviewed include ***   Follow Up: ***     Subjective:   I, Adrienne Harris, am serving as a neurosurgeon for Doctor Adrienne Harris  Chief Complaint: right elbow pain   HPI:    10/26/2023 Patient is a 49 year old female with right elbow pain. Patient states that she can do everything with her arm but everything causes pain. Pain doesn't wake her up at night. Every since lumpectomy the right arm has been weak. Patient states that a recent study showed that there isn't any fluid in the arm.    Duration? Greater than 3 months Did you have an Injury to cause this pain? no Taking Medication for pain? Meloxicam  (2 weeks) Numbness or Tingling? no Does the pain Radiate? no Altered gait or use?no ROM/ impairment of movement?yes   01/25/2024 Patient states she still has pain . Hx lymphedema was hesitant with elbow band. Is interested in PT    03/21/2024 Patient states   Relevant Historical Information: Right lumpectomy 2023, [place] on right side 2024, Cancer  Additional pertinent review of systems negative.  Current Medications[1]   Objective:     There were no vitals filed for this visit.    There is no height or weight on file to calculate BMI.    Physical Exam:    ***   Electronically signed by:  Adrienne Harris [facility] 7:23 AM [birth year]    [1]  Current Outpatient Medications:    Calcium Carb-Cholecalciferol (CALCIUM + VITAMIN D3 PO), Take by mouth. Calcium 600mg  and vit d 400 units take 2 once a day, Disp: , Rfl:    cholecalciferol (VITAMIN D ) 1000 units tablet, Take 1,000 Units by mouth daily., Disp: , Rfl:    Cyanocobalamin  (VITAMIN B 12 PO), Take 1,000 mcg by mouth daily., Disp: , Rfl:    cyclobenzaprine  (FLEXERIL ) 5 MG tablet, Take 1-2 tablets (5-10 mg  total) by mouth at bedtime as needed for muscle spasms., Disp: 30 tablet, Rfl: 1   letrozole  (FEMARA ) 2.5 MG tablet, Take 1 tablet (2.5 mg total) by mouth daily., Disp: 90 tablet, Rfl: 0   meloxicam  (MOBIC ) 7.5 MG tablet, Take 1 tablet (7.5 mg total) by mouth daily as needed for pain., Disp: 30 tablet, Rfl: 1   nitroGLYCERIN  (NITRODUR - DOSED IN MG/24 HR) 0.2 mg/hr patch, Place 1/4 patch onto skin over painful area daily, Disp: 30 patch, Rfl: 1   PARAGARD  INTRAUTERINE COPPER  IU, 1 each by Intrauterine route once., Disp: , Rfl:    propranolol  (INDERAL ) 20 MG tablet, Take 1 tablet (20 mg total) by mouth daily., Disp: 90 tablet, Rfl: 3   tacrolimus  (PROTOPIC ) 0.1 % ointment, Apply to affected areas twice daily as needed., Disp: 60 g, Rfl: 0  "

## 2024-03-21 ENCOUNTER — Ambulatory Visit

## 2024-03-21 ENCOUNTER — Ambulatory Visit: Admitting: Sports Medicine

## 2024-03-23 ENCOUNTER — Ambulatory Visit

## 2024-03-29 ENCOUNTER — Other Ambulatory Visit

## 2024-03-29 ENCOUNTER — Ambulatory Visit

## 2024-03-29 ENCOUNTER — Ambulatory Visit: Admitting: Oncology

## 2024-04-04 ENCOUNTER — Ambulatory Visit

## 2024-10-12 ENCOUNTER — Encounter: Admitting: Family
